# Patient Record
Sex: Male | Born: 1940
Health system: Southern US, Community
[De-identification: ages and names within clinical notes are randomized; demographics above are authoritative.]

## PROBLEM LIST (undated history)

## (undated) DIAGNOSIS — K5730 Diverticulosis of large intestine without perforation or abscess without bleeding: Secondary | ICD-10-CM

## (undated) DIAGNOSIS — I639 Cerebral infarction, unspecified: Secondary | ICD-10-CM

## (undated) DIAGNOSIS — F419 Anxiety disorder, unspecified: Secondary | ICD-10-CM

## (undated) DIAGNOSIS — I10 Essential (primary) hypertension: Secondary | ICD-10-CM

## (undated) DIAGNOSIS — R946 Abnormal results of thyroid function studies: Secondary | ICD-10-CM

## (undated) DIAGNOSIS — N400 Enlarged prostate without lower urinary tract symptoms: Secondary | ICD-10-CM

## (undated) DIAGNOSIS — G589 Mononeuropathy, unspecified: Secondary | ICD-10-CM

## (undated) DIAGNOSIS — K5150 Left sided colitis without complications: Secondary | ICD-10-CM

## (undated) DIAGNOSIS — I714 Abdominal aortic aneurysm, without rupture, unspecified: Secondary | ICD-10-CM

## (undated) DIAGNOSIS — K222 Esophageal obstruction: Secondary | ICD-10-CM

## (undated) DIAGNOSIS — T7840XA Allergy, unspecified, initial encounter: Secondary | ICD-10-CM

## (undated) DIAGNOSIS — G459 Transient cerebral ischemic attack, unspecified: Secondary | ICD-10-CM

## (undated) DIAGNOSIS — C61 Malignant neoplasm of prostate: Secondary | ICD-10-CM

## (undated) DIAGNOSIS — G629 Polyneuropathy, unspecified: Secondary | ICD-10-CM

## (undated) DIAGNOSIS — G4730 Sleep apnea, unspecified: Secondary | ICD-10-CM

## (undated) DIAGNOSIS — N183 Chronic kidney disease, stage 3 unspecified: Secondary | ICD-10-CM

## (undated) DIAGNOSIS — K2980 Duodenitis without bleeding: Secondary | ICD-10-CM

## (undated) DIAGNOSIS — K219 Gastro-esophageal reflux disease without esophagitis: Secondary | ICD-10-CM

## (undated) DIAGNOSIS — L570 Actinic keratosis: Secondary | ICD-10-CM

## (undated) HISTORY — DX: Anxiety disorder, unspecified: F41.9

## (undated) HISTORY — DX: Left sided colitis without complications: K51.50

## (undated) HISTORY — DX: Chronic kidney disease, stage 3 unspecified: N18.30

## (undated) HISTORY — DX: Duodenitis without bleeding: K29.80

## (undated) HISTORY — DX: Allergy, unspecified, initial encounter: T78.40XA

## (undated) HISTORY — PX: COLONOSCOPY: SHX174

## (undated) HISTORY — DX: Cerebral infarction, unspecified: I63.9

## (undated) HISTORY — DX: Abnormal results of thyroid function studies: R94.6

## (undated) HISTORY — PX: ABDOMINAL AORTIC ANEURYSM REPAIR: SHX42

## (undated) HISTORY — DX: Actinic keratosis: L57.0

## (undated) HISTORY — PX: UPPER GASTROINTESTINAL ENDOSCOPY: SHX188

## (undated) HISTORY — DX: Diverticulosis of large intestine without perforation or abscess without bleeding: K57.30

## (undated) HISTORY — DX: Malignant neoplasm of prostate: C61

## (undated) HISTORY — DX: Abdominal aortic aneurysm, without rupture: I71.4

## (undated) HISTORY — DX: Esophageal obstruction: K22.2

## (undated) HISTORY — DX: Essential (primary) hypertension: I10

## (undated) HISTORY — DX: Sleep apnea, unspecified: G47.30

## (undated) HISTORY — DX: Mononeuropathy, unspecified: G58.9

## (undated) HISTORY — DX: Abdominal aortic aneurysm, without rupture, unspecified: I71.40

## (undated) HISTORY — DX: Transient cerebral ischemic attack, unspecified: G45.9

## (undated) HISTORY — DX: Benign prostatic hyperplasia without lower urinary tract symptoms: N40.0

## (undated) HISTORY — PX: CHOLECYSTECTOMY: SHX55

## (undated) HISTORY — DX: Gastro-esophageal reflux disease without esophagitis: K21.9

## (undated) HISTORY — PX: PROSTATE SURGERY: SHX751

## (undated) HISTORY — DX: Chronic kidney disease, stage 3 (moderate): N18.3

---

## 2005-01-24 ENCOUNTER — Inpatient Hospital Stay (HOSPITAL_COMMUNITY): Admission: EM | Admit: 2005-01-24 | Discharge: 2005-01-26 | Payer: Self-pay | Admitting: Emergency Medicine

## 2005-01-24 ENCOUNTER — Ambulatory Visit: Payer: Self-pay | Admitting: Internal Medicine

## 2005-02-01 ENCOUNTER — Ambulatory Visit: Payer: Self-pay | Admitting: Internal Medicine

## 2005-02-03 ENCOUNTER — Ambulatory Visit: Payer: Self-pay | Admitting: Gastroenterology

## 2005-02-06 ENCOUNTER — Ambulatory Visit: Payer: Self-pay | Admitting: Internal Medicine

## 2005-03-09 ENCOUNTER — Encounter (INDEPENDENT_AMBULATORY_CARE_PROVIDER_SITE_OTHER): Payer: Self-pay | Admitting: *Deleted

## 2005-03-09 ENCOUNTER — Ambulatory Visit: Payer: Self-pay | Admitting: Gastroenterology

## 2005-04-13 ENCOUNTER — Ambulatory Visit: Payer: Self-pay | Admitting: Gastroenterology

## 2005-04-18 ENCOUNTER — Ambulatory Visit: Payer: Self-pay | Admitting: Internal Medicine

## 2005-06-06 ENCOUNTER — Ambulatory Visit: Payer: Self-pay | Admitting: Internal Medicine

## 2005-06-14 ENCOUNTER — Ambulatory Visit: Payer: Self-pay | Admitting: Internal Medicine

## 2005-06-20 ENCOUNTER — Ambulatory Visit: Payer: Self-pay | Admitting: Internal Medicine

## 2005-10-09 ENCOUNTER — Ambulatory Visit: Payer: Self-pay | Admitting: Internal Medicine

## 2006-01-29 ENCOUNTER — Ambulatory Visit: Payer: Self-pay | Admitting: Family Medicine

## 2006-02-01 ENCOUNTER — Emergency Department (HOSPITAL_COMMUNITY): Admission: EM | Admit: 2006-02-01 | Discharge: 2006-02-01 | Payer: Self-pay | Admitting: Emergency Medicine

## 2006-02-01 ENCOUNTER — Ambulatory Visit: Payer: Self-pay | Admitting: Family Medicine

## 2006-02-06 ENCOUNTER — Ambulatory Visit: Payer: Self-pay | Admitting: Family Medicine

## 2006-02-07 ENCOUNTER — Emergency Department (HOSPITAL_COMMUNITY): Admission: EM | Admit: 2006-02-07 | Discharge: 2006-02-07 | Payer: Self-pay | Admitting: Emergency Medicine

## 2006-02-09 ENCOUNTER — Ambulatory Visit (HOSPITAL_COMMUNITY): Admission: RE | Admit: 2006-02-09 | Discharge: 2006-02-09 | Payer: Self-pay | Admitting: Gastroenterology

## 2006-02-09 ENCOUNTER — Encounter (INDEPENDENT_AMBULATORY_CARE_PROVIDER_SITE_OTHER): Payer: Self-pay | Admitting: Specialist

## 2006-02-09 ENCOUNTER — Ambulatory Visit: Payer: Self-pay | Admitting: Gastroenterology

## 2006-02-15 ENCOUNTER — Ambulatory Visit: Payer: Self-pay | Admitting: Gastroenterology

## 2006-02-16 ENCOUNTER — Ambulatory Visit: Payer: Self-pay | Admitting: Gastroenterology

## 2006-03-13 ENCOUNTER — Ambulatory Visit: Payer: Self-pay | Admitting: Gastroenterology

## 2006-03-13 DIAGNOSIS — K2980 Duodenitis without bleeding: Secondary | ICD-10-CM | POA: Insufficient documentation

## 2006-04-25 ENCOUNTER — Ambulatory Visit: Payer: Self-pay | Admitting: Gastroenterology

## 2006-05-16 ENCOUNTER — Ambulatory Visit: Payer: Self-pay | Admitting: Gastroenterology

## 2006-08-07 ENCOUNTER — Ambulatory Visit: Payer: Self-pay | Admitting: Gastroenterology

## 2006-09-12 ENCOUNTER — Ambulatory Visit: Payer: Self-pay | Admitting: Gastroenterology

## 2006-09-12 LAB — CONVERTED CEMR LAB
ALT: 20 units/L (ref 0–53)
AST: 15 units/L (ref 0–37)
Albumin: 3.5 g/dL (ref 3.5–5.2)
Alkaline Phosphatase: 78 units/L (ref 39–117)
Basophils Absolute: 0.1 10*3/uL (ref 0.0–0.1)
Basophils Relative: 1.1 % — ABNORMAL HIGH (ref 0.0–1.0)
Bilirubin, Direct: 0.1 mg/dL (ref 0.0–0.3)
Eosinophils Absolute: 0 10*3/uL (ref 0.0–0.6)
Eosinophils Relative: 0.1 % (ref 0.0–5.0)
HCT: 44.4 % (ref 39.0–52.0)
Hemoglobin: 14.6 g/dL (ref 13.0–17.0)
Lymphocytes Relative: 13.9 % (ref 12.0–46.0)
MCHC: 33 g/dL (ref 30.0–36.0)
MCV: 93.9 fL (ref 78.0–100.0)
Monocytes Absolute: 0.2 10*3/uL (ref 0.2–0.7)
Monocytes Relative: 2.6 % — ABNORMAL LOW (ref 3.0–11.0)
Neutro Abs: 6.6 10*3/uL (ref 1.4–7.7)
Neutrophils Relative %: 82.3 % — ABNORMAL HIGH (ref 43.0–77.0)
Platelets: 323 10*3/uL (ref 150–400)
RBC: 4.73 M/uL (ref 4.22–5.81)
RDW: 14 % (ref 11.5–14.6)
Total Bilirubin: 0.6 mg/dL (ref 0.3–1.2)
Total Protein: 6.8 g/dL (ref 6.0–8.3)
WBC: 8 10*3/uL (ref 4.5–10.5)

## 2006-09-18 ENCOUNTER — Ambulatory Visit: Payer: Self-pay | Admitting: Internal Medicine

## 2006-09-18 DIAGNOSIS — K5150 Left sided colitis without complications: Secondary | ICD-10-CM | POA: Insufficient documentation

## 2006-10-08 ENCOUNTER — Ambulatory Visit: Payer: Self-pay | Admitting: Gastroenterology

## 2006-10-09 ENCOUNTER — Ambulatory Visit: Payer: Self-pay | Admitting: Gastroenterology

## 2006-10-09 ENCOUNTER — Encounter: Payer: Self-pay | Admitting: Gastroenterology

## 2006-10-12 ENCOUNTER — Ambulatory Visit: Payer: Self-pay | Admitting: Gastroenterology

## 2006-10-12 LAB — CONVERTED CEMR LAB
ALT: 28 units/L (ref 0–53)
AST: 17 units/L (ref 0–37)
Albumin: 3.1 g/dL — ABNORMAL LOW (ref 3.5–5.2)
Alkaline Phosphatase: 88 units/L (ref 39–117)
BUN: 22 mg/dL (ref 6–23)
CO2: 28 meq/L (ref 19–32)
Calcium: 8.8 mg/dL (ref 8.4–10.5)
Chloride: 110 meq/L (ref 96–112)
Creatinine, Ser: 1 mg/dL (ref 0.4–1.5)
GFR calc Af Amer: 96 mL/min
GFR calc non Af Amer: 80 mL/min
Glucose, Bld: 115 mg/dL — ABNORMAL HIGH (ref 70–99)
HCT: 42.1 % (ref 39.0–52.0)
Potassium: 4.3 meq/L (ref 3.5–5.1)
Sed Rate: 11 mm/hr (ref 0–20)
Sodium: 142 meq/L (ref 135–145)
Total Bilirubin: 0.7 mg/dL (ref 0.3–1.2)
Total Protein: 5.9 g/dL — ABNORMAL LOW (ref 6.0–8.3)

## 2006-10-15 ENCOUNTER — Ambulatory Visit: Payer: Self-pay | Admitting: Gastroenterology

## 2006-10-22 ENCOUNTER — Ambulatory Visit: Payer: Self-pay | Admitting: Gastroenterology

## 2006-11-05 ENCOUNTER — Ambulatory Visit: Payer: Self-pay | Admitting: Gastroenterology

## 2006-12-19 ENCOUNTER — Ambulatory Visit: Payer: Self-pay | Admitting: Gastroenterology

## 2007-01-08 ENCOUNTER — Ambulatory Visit: Payer: Self-pay | Admitting: Internal Medicine

## 2007-01-08 DIAGNOSIS — R21 Rash and other nonspecific skin eruption: Secondary | ICD-10-CM | POA: Insufficient documentation

## 2007-02-23 DIAGNOSIS — K222 Esophageal obstruction: Secondary | ICD-10-CM | POA: Insufficient documentation

## 2007-03-01 ENCOUNTER — Ambulatory Visit: Payer: Self-pay | Admitting: Internal Medicine

## 2007-03-01 DIAGNOSIS — I10 Essential (primary) hypertension: Secondary | ICD-10-CM | POA: Insufficient documentation

## 2007-03-01 DIAGNOSIS — N400 Enlarged prostate without lower urinary tract symptoms: Secondary | ICD-10-CM | POA: Insufficient documentation

## 2007-03-07 ENCOUNTER — Telehealth (INDEPENDENT_AMBULATORY_CARE_PROVIDER_SITE_OTHER): Payer: Self-pay | Admitting: *Deleted

## 2007-03-12 ENCOUNTER — Encounter: Payer: Self-pay | Admitting: Internal Medicine

## 2007-03-22 ENCOUNTER — Ambulatory Visit: Payer: Self-pay | Admitting: Internal Medicine

## 2007-03-22 DIAGNOSIS — R946 Abnormal results of thyroid function studies: Secondary | ICD-10-CM | POA: Insufficient documentation

## 2007-03-22 DIAGNOSIS — L570 Actinic keratosis: Secondary | ICD-10-CM | POA: Insufficient documentation

## 2007-03-22 LAB — CONVERTED CEMR LAB
Bilirubin Urine: NEGATIVE
Blood in Urine, dipstick: NEGATIVE
Free T4: 0.6 ng/dL (ref 0.6–1.6)
Glucose, Urine, Semiquant: NEGATIVE
Ketones, urine, test strip: NEGATIVE
Nitrite: NEGATIVE
Specific Gravity, Urine: 1.025
TSH: 5.24 microintl units/mL (ref 0.35–5.50)
Urobilinogen, UA: 0.2
WBC Urine, dipstick: NEGATIVE
pH: 5

## 2007-05-02 ENCOUNTER — Ambulatory Visit: Payer: Self-pay | Admitting: Internal Medicine

## 2007-05-02 DIAGNOSIS — G589 Mononeuropathy, unspecified: Secondary | ICD-10-CM | POA: Insufficient documentation

## 2007-05-03 LAB — CONVERTED CEMR LAB: Vitamin B-12: 389 pg/mL (ref 211–911)

## 2007-07-06 ENCOUNTER — Encounter: Payer: Self-pay | Admitting: Gastroenterology

## 2007-07-08 ENCOUNTER — Telehealth: Payer: Self-pay | Admitting: Gastroenterology

## 2007-07-09 ENCOUNTER — Telehealth: Payer: Self-pay | Admitting: Gastroenterology

## 2007-07-10 ENCOUNTER — Telehealth: Payer: Self-pay | Admitting: Gastroenterology

## 2007-07-10 ENCOUNTER — Encounter: Payer: Self-pay | Admitting: Gastroenterology

## 2007-07-10 ENCOUNTER — Ambulatory Visit (HOSPITAL_COMMUNITY): Admission: RE | Admit: 2007-07-10 | Discharge: 2007-07-10 | Payer: Self-pay | Admitting: Gastroenterology

## 2007-07-17 ENCOUNTER — Ambulatory Visit: Payer: Self-pay | Admitting: Gastroenterology

## 2007-08-01 ENCOUNTER — Telehealth: Payer: Self-pay | Admitting: Gastroenterology

## 2007-08-05 ENCOUNTER — Encounter: Payer: Self-pay | Admitting: Internal Medicine

## 2007-08-05 ENCOUNTER — Ambulatory Visit (HOSPITAL_COMMUNITY): Admission: RE | Admit: 2007-08-05 | Discharge: 2007-08-05 | Payer: Self-pay | Admitting: Gastroenterology

## 2007-08-06 ENCOUNTER — Encounter: Payer: Self-pay | Admitting: Gastroenterology

## 2007-09-03 ENCOUNTER — Ambulatory Visit: Payer: Self-pay | Admitting: Gastroenterology

## 2007-12-23 ENCOUNTER — Ambulatory Visit (HOSPITAL_COMMUNITY): Admission: RE | Admit: 2007-12-23 | Discharge: 2007-12-23 | Payer: Self-pay | Admitting: Family Medicine

## 2009-01-18 ENCOUNTER — Telehealth: Payer: Self-pay | Admitting: Gastroenterology

## 2009-01-18 ENCOUNTER — Encounter: Payer: Self-pay | Admitting: Gastroenterology

## 2009-01-27 ENCOUNTER — Ambulatory Visit (HOSPITAL_COMMUNITY): Admission: RE | Admit: 2009-01-27 | Discharge: 2009-01-27 | Payer: Self-pay | Admitting: Gastroenterology

## 2009-01-27 ENCOUNTER — Ambulatory Visit: Payer: Self-pay | Admitting: Gastroenterology

## 2009-03-16 ENCOUNTER — Ambulatory Visit: Payer: Self-pay | Admitting: Urology

## 2009-05-11 ENCOUNTER — Ambulatory Visit: Payer: Self-pay | Admitting: Urology

## 2009-05-12 ENCOUNTER — Ambulatory Visit: Payer: Self-pay | Admitting: Internal Medicine

## 2009-05-25 ENCOUNTER — Ambulatory Visit: Payer: Self-pay | Admitting: Urology

## 2009-07-27 ENCOUNTER — Emergency Department: Payer: Self-pay | Admitting: Emergency Medicine

## 2009-07-27 ENCOUNTER — Telehealth: Payer: Self-pay | Admitting: Gastroenterology

## 2009-07-27 ENCOUNTER — Encounter: Payer: Self-pay | Admitting: Gastroenterology

## 2009-07-28 ENCOUNTER — Ambulatory Visit: Payer: Self-pay | Admitting: Gastroenterology

## 2009-08-10 ENCOUNTER — Telehealth: Payer: Self-pay | Admitting: Gastroenterology

## 2009-08-11 ENCOUNTER — Telehealth: Payer: Self-pay | Admitting: Gastroenterology

## 2009-08-13 ENCOUNTER — Ambulatory Visit (HOSPITAL_COMMUNITY): Admission: RE | Admit: 2009-08-13 | Discharge: 2009-08-13 | Payer: Self-pay | Admitting: Gastroenterology

## 2009-08-13 ENCOUNTER — Encounter (INDEPENDENT_AMBULATORY_CARE_PROVIDER_SITE_OTHER): Payer: Self-pay | Admitting: *Deleted

## 2009-08-13 ENCOUNTER — Ambulatory Visit: Payer: Self-pay | Admitting: Gastroenterology

## 2009-08-19 ENCOUNTER — Telehealth: Payer: Self-pay | Admitting: Gastroenterology

## 2009-08-25 ENCOUNTER — Ambulatory Visit: Payer: Self-pay | Admitting: Gastroenterology

## 2009-08-25 DIAGNOSIS — R1013 Epigastric pain: Secondary | ICD-10-CM | POA: Insufficient documentation

## 2009-08-27 ENCOUNTER — Encounter: Payer: Self-pay | Admitting: Gastroenterology

## 2009-11-09 ENCOUNTER — Telehealth: Payer: Self-pay | Admitting: Gastroenterology

## 2009-11-10 ENCOUNTER — Ambulatory Visit: Payer: Self-pay | Admitting: Gastroenterology

## 2010-01-13 ENCOUNTER — Ambulatory Visit: Payer: Self-pay | Admitting: Urology

## 2010-02-20 ENCOUNTER — Encounter: Payer: Self-pay | Admitting: Family Medicine

## 2010-03-02 NOTE — Assessment & Plan Note (Signed)
Summary: CPX/CDL   Vital Signs:  Patient Profile:   70 Years Old Male Weight:      222.50 pounds Temp:     97.1 degrees F oral Pulse rate:   84 / minute BP sitting:   134 / 86  (left arm) Cuff size:   large  Vitals Entered By: Wandra Mannan (March 22, 2007 8:25 AM)              Vision Screening: Left eye w/o correction: 20 / 25 Right Eye w/o correction: 20 / 25 Both eyes w/o correction:  20/ 20        Vision Entered By: Wandra Mannan (March 22, 2007 8:26 AM) Audiometry Screening        Left  500 hz: 25db 1000 hz: 25db 2000 hz: 25db 4000 hz: 25db Right  500 hz: 25db 1000 hz: 25db 2000 hz: 25db 4000 hz: 25db    Chief Complaint:  cpx DOT.  History of Present Illness: colitis seems better since on probiotics  Couldn't tolerate HCTZ but BP seems fine  Had blood work from work WBC  ~12K TSH slightly elevated    Current Allergies (reviewed today): ! FLAGYL (METRONIDAZOLE)  Past Medical History:    Reviewed history from 03/01/2007 and no changes required:       Colitis--left sided----------------------------Dr Arlyce Dice       Hypertension       Benign prostatic hypertrophy  Past Surgical History:    Reviewed history from 03/01/2007 and no changes required:       Cholecystectomy 2002       Colonscopy 2003       hernia repair 1982       EGD duodenitis and stricture 03/13/2006       Prostate procedure 1990's--Stoioff   Family History:    Dad died of suicide    Mom died of cancer @82     1 sister    No CAD, DM    ??HTN in family    No prostate or colon cancer  Social History:    Reviewed history from 01/08/2007 and no changes required:       Married--1 son       Never Smoked       Alcohol use-no       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  The patient denies blurring, diplopia, vision loss, tinnitus, decreased hearing, chest pain,  palpitations, syncope, dyspnea on exertion, peripheral edema, nausea, vomiting, melena, hematochezia, erectile dysfunction, joint swelling, anxiety, and enlarged lymph nodes.         Notes medial left calf pain when he puts his foot down. No claudication Sleeps poorly often--no real change Freq nocturia but not that much trouble during day Teeth okay--overdue for dentist Slight cough from lisinopril Nausea has resolved Mild neck and hand pain scaly areas on left temple Bruised easy since being on prednisone for colitis--mostly on left hand Gets episodic depression--lasts only briefly   Physical Exam  General:     alert and normal appearance.   Eyes:     pupils equal, pupils round, pupils reactive to light, and no optic disk abnormalities.   Ears:     R ear normal and L ear normal.   Mouth:     no lesions.   Neck:     supple, no masses, no thyromegaly, no carotid bruits, and no cervical lymphadenopathy.  Lungs:     normal respiratory effort and normal breath sounds.   Heart:     normal rate, regular rhythm, no murmur, and no gallop.   Abdomen:     soft, non-tender, no masses, no hepatomegaly, and no splenomegaly.   Rectal:     no hemorrhoids and no masses.   tight sphincter--linited internal Msk:     no joint tenderness and no joint swelling.   Pulses:     faint in left foot 1+ in rgiht Extremities:     no edema or varicosities Neurologic:     strength normal in all extremities.   Skin:     2 actinics on left forehead 3 early ones left preauricular area Axillary Nodes:     No palpable lymphadenopathy Psych:     normally interactive, good eye contact, not anxious appearing, and not depressed appearing.      Impression & Recommendations:  Problem # 1:  PREVENTIVE HEALTH CARE (ICD-V70.0) Assessment: Comment Only has had PSA and colon discussed fitness Orders: UA Dipstick w/o Micro (manual) (04540)   Problem # 2:  HYPERTENSION (ICD-401.9) Assessment:  Unchanged good control  The following medications were removed from the medication list:    Lisinopril-hydrochlorothiazide 10-12.5 Mg Tabs (Lisinopril-hydrochlorothiazide) .Marland Kitchen... 1 daily  His updated medication list for this problem includes:    Lisinopril 10 Mg Tabs (Lisinopril) .Marland Kitchen... 1 daily    Terazosin Hcl 5 Mg Caps (Terazosin hcl) .Marland Kitchen... 1 daily at bedtime   Problem # 3:  BENIGN PROSTATIC HYPERTROPHY (ICD-600.00) Assessment: Unchanged nocturia is troubling will try terazosin  Problem # 4:  THYROID FUNCTION TEST, ABNORMAL (ICD-794.5) Assessment: Comment Only will recheck Orders: Venipuncture (98119) TLB-TSH (Thyroid Stimulating Hormone) (84443-TSH) TLB-T4 (Thyrox), Free (14782-NF6O)   Problem # 5:  ACTINIC KERATOSIS (ICD-702.0) Assessment: New liquid nitrogen 30 seconds x 2 to more advanced areas 20 seconds x 2 to others Orders: Cryotherapy/Destruction benign or premalignant lesion (1st lesion)  (17000) Cryotherapy/Destruction benign or premalignant lesion (2nd-14th lesions) (17003)   Complete Medication List: 1)  Lisinopril 10 Mg Tabs (Lisinopril) .Marland Kitchen.. 1 daily 2)  Terazosin Hcl 5 Mg Caps (Terazosin hcl) .Marland Kitchen.. 1 daily at bedtime  Other Orders: Pneumococcal Vaccine Ped < 39yrs (13086) Admin 1st Vaccine (57846)   Patient Instructions: 1)  Please schedule a follow-up appointment in 6 months.    Prescriptions: TERAZOSIN HCL 5 MG  CAPS (TERAZOSIN HCL) 1 daily at bedtime  #30 x 12   Entered and Authorized by:   Cindee Salt MD   Signed by:   Cindee Salt MD on 03/22/2007   Method used:   Electronically sent to ...       [facility]*       [address]       [place], [place]  [address]       Ph: [phone] or [phone]       Fax: [phone]   RxID:   [id number]  ] Current Allergies (reviewed today): ! FLAGYL (METRONIDAZOLE) Current Medications (including changes made in today's visit):  LISINOPRIL 10 MG  TABS  (LISINOPRIL) 1 daily TERAZOSIN HCL 5 MG  CAPS (TERAZOSIN HCL) 1 daily at bedtime   Laboratory Results   Urine Tests  Date/Time Recieved: 03/22/2007  Routine Urinalysis   Color: yellow Appearance: Clear Glucose: negative   (Normal Range: Negative) Bilirubin: negative   (Normal Range: Negative) Ketone: negative   (Normal Range: Negative) Spec. Gravity: 1.025   (Normal  Range: 1.003-1.035) Blood: negative   (Normal Range: Negative) pH: 5.0   (Normal Range: 5.0-8.0) Protein: trace   (Normal Range: Negative) Urobilinogen: 0.2   (Normal Range: 0-1) Nitrite: negative   (Normal Range: Negative) Leukocyte Esterace: negative   (Normal Range: Negative)        Pneumococcal Vaccine # 1    Vaccine Type: Prevnar    Site: right deltoid    Mfr: Merck    Dose: 0.25 ml    Route: IM    Given by: Wandra Mannan    Exp. Date: 06/26/2008    Lot #: [id number]    VIS given: 10/29/00 version given March 22, 2007.  Pneumovax Vaccine    Vaccine Type: Pneumovax

## 2010-03-02 NOTE — Procedures (Signed)
Summary: Flexible Sigmoidoscopy  Patient: Matthew Luna Note: All result statuses are Final unless otherwise noted.  Tests: (1) Flexible Sigmoidoscopy (FLX)  FLX Flexible Sigmoidoscopy                             DONE     [facility]     [address] [place], [place]  [address]           FLEXIBLE SIGMOIDOSCOPY PROCEDURE REPORT           PATIENT:  [name], [name]  MR#:  [id number]     BIRTHDATE:  31-Jul-1940, 68 yrs. old  GENDER:  male           ENDOSCOPIST:  Barbette Hair. Arlyce Dice, MD     Referred by:           PROCEDURE DATE:  08/13/2009     PROCEDURE:  Flexible Sigmoidoscopy with biopsy     ASA CLASS:  Class II     INDICATIONS:  diarrhea Recent antibiotic Rx with cipro; took vanco     for 9 days; now with recurrent diarrhea           MEDICATIONS:   Fentanyl 75 mcg, Versed 6 mg IV           DESCRIPTION OF PROCEDURE:   After the risks benefits and     alternatives of the procedure were thoroughly explained, informed     consent was obtained.  Digital rectal exam was performed and     revealed no abnormalities.   The  endoscope was introduced through     the anus and advanced to the splenic flexure, without limitations.     The quality of the prep was .  The instrument was then slowly     withdrawn as the mucosa was fully examined.     <<PROCEDUREIMAGES>>           Colitis was found. Diffuse, mild erythema with questionable minute     pseudomembranes. Changes more marked in distal 25cm of colon with     areas of submucosal hemorrhage as well. Bxs taken (see image2,     image3, image6, and image7).  Small amount of mucus.  Moderate     diverticulosis was found in the sigmoid colon (see image6).     Retroflexed views in the rectum revealed not performed.    The scope     was then withdrawn from the patient and the procedure terminated.           COMPLICATIONS:  None           ENDOSCOPIC IMPRESSION:     1) Probable recurrent pseudomembranous  colitis.  Suspect PMC  in     view of extent of colitis to more proximal colon.  Pt has h/o left     sided colitis only.   More marked changes in left colon are     consistent with findings.     2) Moderate diverticulosis in the sigmoid colon     RECOMMENDATIONS:Resume vanco for 1 month tapering course     Florastor qd     Stool for C dificile     toxin           REPEAT EXAM:  No           ______________________________     Barbette Hair. Arlyce Dice, MD  CC:  Lynnea Ferrier, MD           n.     Rosalie DoctorBarbette Hair. Zymire Turnbo at 08/13/2009 09:12 AM           Gillermo Murdoch, [id number]  Note: An exclamation mark (!) indicates a result that was not dispersed into the flowsheet. Document Creation Date: 08/13/2009 9:13 AM _______________________________________________________________________  (1) Order result status: Final Collection or observation date-time: 08/13/2009 09:01 Requested date-time:  Receipt date-time:  Reported date-time:  Referring Physician:   Ordering Physician: Melvia Heaps [phone]) Specimen Source:  Source: Launa Grill Order Number: [id number] Lab site:

## 2010-03-02 NOTE — Procedures (Signed)
Summary: Gastroenterology Flex  Gastroenterology Flex   Imported By: Christie Nottingham 02/28/2007 11:50:54  _____________________________________________________________________  External Attachment:    Type:   Image     Comment:   External Document  Appended Document: Gastroenterology Flex SP-Surgical Pathology - STATUS: Final  .          By: Morrie Sheldon,       Perform Date: 9 Sep08 00:01  Ordered By: Dennard Nip,         Ordered Date: 10Sep08 09:17  Facility: [facility]                               Department: CPATH  Service Report Text  [facility]   [address] [address]   [place], [place] [address]   Telephone [phone] or [phone] Fax [phone]    REPORT OF SURGICAL PATHOLOGY    Case #: [id number]   Patient Name: Matthew Luna, Matthew Luna.   Office Chart Number: [id number]    MRN: [id number]   Pathologist: Beulah Gandy. Luisa Hart, MD   DOB/Age [birth year] (Age: 70) Gender: M   Date Taken: 10/09/2006   Date Received: 10/09/2006    FINAL DIAGNOSIS    ***MICROSCOPIC EXAMINATION AND DIAGNOSIS***    RECTAL BIOPSY: CHRONIC MINIMALLY ACTIVE INFLAMMATION. SEE   COMMENT.    COMMENT   The biopsy shows increased chronic inflammation in the lamina   propria associated with a rare focus of active neutrophilic   cryptitis with erosion of the surface. There is also decrease in   the number of crypts and the rare residual crypt shows distortion   and slight nuclear stratification. The findings are consistent   with chronic mildly active colitis and although this biopsy   fragment is small, findings are consistent with inflammatory   bowel disease. The mildly atypical changes within the few   residual crypts include nuclear stratification and a rare mitotic   figure. Although the biopsy is small and there are only a few   residual crypts present, the findings suggest reactive and   inflammatory atypia rather than dysplastic related atypia.   (JDP:gt,  10/11/06)    gdt   Date Reported: 10/11/2006 Beulah Gandy. Luisa Hart, MD   *** Electronically Signed Out By JDP ***    Clinical information   Colitis (jes)    specimen(s) obtained   Rectum, biopsy    Gross Description   Received in formalin is a tan, soft tissue fragment that is   submitted in toto. Size: 0.2 cm One block (ML:jes,10/10/06)    jes/   Additional Information  HL7 RESULT STATUS : F  External IF Update Timestamp : 2006-10-09:22:25:00.000000

## 2010-03-02 NOTE — Progress Notes (Signed)
Summary: Triage--Dysphagia  Phone Note Call from Patient Call back at [phone]   Caller: Patient Reason for Call: Talk to Nurse Summary of Call: Pt is having trouble swallowing. Initial call taken by: Karna Christmas,  January 18, 2009 3:33 PM  Follow-up for Phone Call        Worsening dysphagia for 1 month. Doesn't take a PPI. Thinks he has Prevacid at home.  1) Prevacid two times a day for 7 days, then once daily 2) Soft,bland diet. Be very careful with meats,rice and breads. 3) Endo/Balloon Dil. at [facility] on 01-27-09 at 7:45am 4) If symptoms become worse call back immediately or go to ER.  Follow-up by: Laureen Ochs LPN,  January 18, 2009 4:07 PM

## 2010-03-02 NOTE — Progress Notes (Signed)
Summary: Condition Update  Phone Note Outgoing Call Call back at Home Phone [phone]   Call placed by: Laureen Ochs LPN,  August 19, 2009 8:12 AM Call placed to: Patient Summary of Call: Follow-up from Flex. biopsy on 08-13-09.  Pt. states he is improving, slowly, but he is improving. He wants to complete the bottle of Vanco. he is taking. He will callback on Monday with an update. He will call sooner as needed. Initial call taken by: Laureen Ochs LPN,  August 19, 2009 8:16 AM  Follow-up for Phone Call        Pt. calling to state the diarrhea is still present, he doesn't feel it is getting any better. Per Flex path. report on 08-13-09, pt. will stop Vanco. and begin Prednisone 40mg  daily. He will keep appt. w/Dr.Jacqlyn Marolf on 08-25-09, callback sooner as needed. Follow-up by: Laureen Ochs LPN,  August 20, 2009 11:24 AM    New/Updated Medications: PREDNISONE 10 MG  TABS (PREDNISONE) Take 4 tabs (40mg ) once daily.    (You will taper off as directed by Dr.Nile Prisk at your appt. 08-25-09) Prescriptions: PREDNISONE 10 MG  TABS (PREDNISONE) Take 4 tabs (40mg ) once daily.    (You will taper off as directed by Dr.Arietta Eisenstein at your appt. 08-25-09)  #100 x 0   Entered by:   Laureen Ochs LPN   Authorized by:   Louis Meckel MD   Signed by:   Laureen Ochs LPN on 09/81/1914   Method used:   Electronically to        [id number] (retail)       [address]       [place], [place]  [address]       Ph: [phone] or [phone]       Fax: [phone]   RxID:   [id number]

## 2010-03-02 NOTE — Op Note (Signed)
Summary: Esoph foreign body/[facility]  Esoph foreign body/[facility]   Imported By: Lester [place] 07/10/2007 10:44:37  _____________________________________________________________________  External Attachment:    Type:   Image     Comment:   External Document

## 2010-03-02 NOTE — Assessment & Plan Note (Signed)
Summary: POST FLEX. FOLLOW-UP               [name]   History of Present Illness Visit Type: Follow-up Visit Primary GI MD: Melvia Heaps MD [facility] Primary Provider: Chaney Malling, MD  Requesting Provider: na Chief Complaint: follow-up Flex Sig. History of Present Illness:   Matthew Luna has returned for followup of his diarrhea. Flexiblel sigmoidoscopy demonstrated diffuse colitis with more marked changes in the distal left colon.  Biopsies showed a chronic active colitis though no pseudomembranes.  On 40 mg of prednisone his diarrhea has significantly improved although it remains.  In addition, he complains of postprandial upper abdominal fullness.  He denies dysphagia.  He has a history of esophageal stricture.   GI Review of Systems    Reports abdominal pain and  chest pain.     Location of  Abdominal pain: epigastric area.    Denies acid reflux, belching, bloating, dysphagia with liquids, dysphagia with solids, heartburn, loss of appetite, nausea, vomiting, vomiting blood, weight loss, and  weight gain.      Reports diarrhea.     Denies anal fissure, black tarry stools, change in bowel habit, constipation, diverticulosis, fecal incontinence, heme positive stool, hemorrhoids, irritable bowel syndrome, jaundice, light color stool, liver problems, rectal bleeding, and  rectal pain.    Current Medications (verified): 1)  Celexa 10 Mg Tabs (Citalopram Hydrobromide) .... One Tablet By Mouth Once Daily 2)  Prednisone 10 Mg  Tabs (Prednisone) .... Take 4 Tabs (40mg ) Once Daily.    (You Will Taper Off As Directed By Dr.Rod Majerus At Your Appt. 08-25-09)  Allergies (verified): 1)  ! Flagyl (Metronidazole) 2)  ! * Terazosin 3)  ! * Lialda 4)  ! Augmentin (Amoxicillin-Pot Clavulanate)  Past History:  Past Medical History: Reviewed history from 03/22/2007 and no changes required. Colitis--left sided----------------------------Dr Arlyce Dice Hypertension Benign prostatic hypertrophy  Past  Surgical History: Reviewed history from 03/01/2007 and no changes required. Cholecystectomy 2002 Colonscopy 2003 hernia repair 1982 EGD duodenitis and stricture 03/13/2006 Prostate procedure 1990's--Stoioff  Family History: Reviewed history from 03/22/2007 and no changes required. Dad died of suicide Mom died of cancer @82  1 sister No CAD, DM ??HTN in family No prostate or colon cancer  Social History: Reviewed history from 01/08/2007 and no changes required. 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  The patient denies allergy/sinus, anemia, anxiety-new, arthritis/joint pain, back pain, blood in urine, breast changes/lumps, change in vision, confusion, coughing up blood, depression-new, fainting, fatigue, fever, headaches-new, hearing problems, heart murmur, heart rhythm changes, itching, muscle pains/cramps, night sweats, nosebleeds, shortness of breath, skin rash, sleeping problems, sore throat, swelling of feet/legs, swollen lymph glands, thirst - excessive, urination - excessive, urination changes/pain, urine leakage, vision changes, and voice change.    Vital Signs:  Patient profile:   70 year old male Height:      75 inches Weight:      228 pounds BMI:     28.60 Pulse rate:   68 / minute Pulse rhythm:   regular BP sitting:   148 / 94  (left arm)  Vitals Entered By: Milford Cage NCMA (August 25, 2009 3:42 PM)   Impression & Recommendations:  Problem # 1:  COLITIS, LEFT-SIDED ULCERATIVE (ICD-556.5) Despite the negative biopsies for pseudomembranes I have some suspicion that he could have recurrent pseudomembranous colitis in addition to his idiopathic  colitis.  Recommendations #1 decrease prednisone to 30 mg daily and then to 20 mg in 5-7 days #2 stool for C. difficile toxin Orders: T-Culture, C-Diff Toxin A/B ([id number])  Problem # 2:  ABDOMINAL PAIN,  EPIGASTRIC (ICD-789.06) This problem is improving and is probably due to nonulcer dyspepsia  Recommendations #1 empiric therapy with AcipHex 20 mg daily  Problem # 3:  ESOPHAGEAL STRICTURE (ICD-530.3) Assessment: Comment Only  Patient Instructions: 1)  Copy sent to : Donita Brooks II, MD  2)  You will go to the basement today for labs 3)  We are giving you Aciphex samples today 4)  Reduce Prednisone to three times a day for 1 week 5)  then reduce to two times a day for 1 week 6)  You will need to return in 4 weeks for a follow up appointment 7)  The medication list was reviewed and reconciled.  All changed / newly prescribed medications were explained.  A complete medication list was provided to the patient / caregiver. Prescriptions: ACIPHEX 20 MG TBEC (RABEPRAZOLE SODIUM) take one tab daily  #30 x 1   Entered and Authorized by:   Louis Meckel MD   Signed by:   Louis Meckel MD on 08/25/2009   Method used:   [facility] to        [id number]       [address]       [place], [place]  [address]       Ph: [phone] or [phone]       Fax: [phone]   RxID:   [id number]

## 2010-03-02 NOTE — Assessment & Plan Note (Signed)
Summary: Elevated BP   Vital Signs:  Patient Profile:   70 Years Old Male Weight:      228.25 pounds Temp:     97 degrees F oral Pulse rate:   80 / minute BP sitting:   144 / 98  (left arm) Cuff size:   large  Vitals Entered By: Wandra Mannan (March 01, 2007 10:06 AM)                 Chief Complaint:  elevated bp.  History of Present Illness: BP was found to be elevated about 2 weeks ago Went for CDL exam--BP  ~161/09 Started on lisinopril 5mg  daily Diastolic noted down in 80's then But then up again so increased to 10mg  daily Has tried to cut down salt    Current Allergies (reviewed today): ! 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         Occ dull headache--fairly rare though Occ mild dizziness--very sligh Notes electricity feeling from inside of left ankle to mid calf Colitis is better on probiotic Still with freq nocturia   Serial Vital Signs/Assessments:  Time      Position  BP       Pulse  Resp  Temp     By           R Arm     146/100                        Cindee Salt MD   Physical Exam  General:     alert and normal appearance.   Neck:     supple, no masses, no thyromegaly, no carotid bruits, and no cervical lymphadenopathy.   Lungs:   normal respiratory effort and normal breath sounds.   Heart:     normal rate, regular rhythm, no murmur, and no gallop.   Extremities:     no edema    Impression & Recommendations:  Problem # 1:  HYPERTENSION (ICD-401.9) Assessment: Deteriorated will add HCTZ Recheck about 3 weeks  His updated medication list for this problem includes:    Lisinopril-hydrochlorothiazide 10-12.5 Mg Tabs (Lisinopril-hydrochlorothiazide) .Marland Kitchen... 1 daily   Complete Medication List: 1)  Protonix 40 Mg Tbec (Pantoprazole sodium) .... Take 1 tablet by mouth once a day 2)  Lisinopril-hydrochlorothiazide 10-12.5 Mg Tabs (Lisinopril-hydrochlorothiazide) .Marland Kitchen.. 1 daily   Patient Instructions: 1)  Please schedule a follow-up appointment in 3 weeks. 2)  Stop the plain lisinopril and start the lisinopril with HCTZ    Prescriptions:  LISINOPRIL-HYDROCHLOROTHIAZIDE 10-12.5 MG  TABS (LISINOPRIL-HYDROCHLOROTHIAZIDE) 1 daily  #30 x 12   Entered and Authorized by:   Cindee Salt MD   Signed by:   Cindee Salt MD on 03/01/2007   Method used:   Electronically sent to ...       [facility]*       [address]       [place], [place]  [address]       Ph: [phone] or [phone]       Fax: [phone]   RxID:   [id number]  ] Current Allergies (reviewed today): ! FLAGYL (METRONIDAZOLE) Current Medications (including changes made in today's visit):  PROTONIX 40 MG  TBEC (PANTOPRAZOLE SODIUM) Take 1 tablet by mouth once a day LISINOPRIL-HYDROCHLOROTHIAZIDE 10-12.5 MG  TABS (LISINOPRIL-HYDROCHLOROTHIAZIDE) 1 daily

## 2010-03-02 NOTE — Procedures (Signed)
Summary: Gastroenterology EGD  Gastroenterology EGD   Imported By: Christie Nottingham 02/28/2007 11:50:22  _____________________________________________________________________  External Attachment:    Type:   Image     Comment:   External Document

## 2010-03-02 NOTE — Assessment & Plan Note (Signed)
Summary: ER LAST NIGHT/COLITIS FLARE AND ? C-DIFF.          [name]   History of Present Illness Visit Type: Follow-up Visit Primary GI MD: Melvia Heaps MD [facility] Primary Provider: Chaney Malling, MD  Requesting Provider: na Chief Complaint: Colitis flare, diarrhea and fatigue  History of Present Illness:   Matthew Luna has returned for evaluation of diarrhea.  Over the past week she has had increasingly severe diarrhea.  Yesterday he was seen at [facility] ER for this problem.  CT Scan did not there is any abnormalities but his white count was.  He had taken a one-week course of Cipro 3  weeks before.  Since starting vancomycin 12 hours ago he feels improved.  He has had no bleeding.  He has a history of left-sided colitis.   GI Review of Systems      Denies abdominal pain, acid reflux, belching, bloating, chest pain, dysphagia with liquids, dysphagia with solids, heartburn, loss of appetite, nausea, vomiting, vomiting blood, weight loss, and  weight gain.      Reports diarrhea.     Denies anal fissure, black tarry stools, change in bowel habit, constipation, diverticulosis, fecal incontinence, heme positive stool, hemorrhoids, irritable bowel syndrome, jaundice, light color stool, liver problems, rectal bleeding, and  rectal pain.    Current Medications (verified): 1)  Zocor 40 Mg Tabs (Simvastatin) .... 1/2 Tablet By Mouth Once Daily 2)  Celexa 10 Mg Tabs (Citalopram Hydrobromide) .... One Tablet By Mouth Once Daily 3)  Vancocin Hcl 125 Mg Caps (Vancomycin Hcl) .... One Capsule By Mouth Four Times A Day For Ten Days  Allergies (verified): 1)  ! Flagyl (Metronidazole) 2)  ! * Terazosin 3)  ! * Lialda 4)  ! Augmentin (Amoxicillin-Pot Clavulanate)  Past History:  Past Medical History: Reviewed history from 03/22/2007 and no changes required. Colitis--left sided----------------------------Dr Arlyce Dice Hypertension Benign prostatic hypertrophy  Past Surgical  History: Reviewed history from 03/01/2007 and no changes required. Cholecystectomy 2002 Colonscopy 2003 hernia repair 1982 EGD duodenitis and stricture 03/13/2006 Prostate procedure 1990's--Stoioff  Family History: Reviewed history from 03/22/2007 and no changes required. Dad died of suicide Mom died of cancer @82  1 sister No CAD, DM ??HTN in family No prostate or colon cancer  Social History: Reviewed history from 01/08/2007 and no changes required. 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  The patient denies allergy/sinus, anemia, anxiety-new, arthritis/joint pain, back pain, blood in urine, breast changes/lumps, change in vision, confusion, cough, coughing up blood, depression-new, fainting, headaches-new, hearing problems, heart murmur, heart rhythm changes, itching, muscle pains/cramps, night sweats, nosebleeds, shortness of breath, skin rash, sleeping problems, sore throat, swelling of feet/legs, swollen lymph glands, thirst - excessive, urination - excessive, urination changes/pain, urine leakage, vision changes, and voice change.    Vital Signs:  Patient profile:   70 year old male Height:      75 inches Weight:      225 pounds BMI:     28.22 BSA:     2.31 Temp:     97.7 degrees F oral Pulse rate:   76 / minute Pulse rhythm:   regular BP sitting:   146 / 88  (left arm) Cuff size:   regular  Vitals Entered By: Ok Anis CMA (July 28, 2009 2:44 PM)  Physical Exam  Additional Exam:  On physical exam  he is a slightly ill-appearing male  skin: anicteric HEENT: normocephalic; PEERLA; no nasal or pharyngeal abnormalities neck: supple nodes: no cervical lymphadenopathy chest: clear to ausculatation and percussion heart: no murmurs, gallops, or rubs abd: soft, nontender; BS normoactive; no abdominal masses, tenderness, organomegaly rectal:  deferred ext: no cynanosis, clubbing, edema skeletal: no deformities neuro: oriented x 3; no focal abnormalities    Impression & Recommendations:  Problem # 1:  COLITIS, LEFT-SIDED ULCERATIVE (ICD-556.5) His current diarrhea could very well be due to pseudomembranous colitis.  Recommendations #1 continue vancomycin add a probiotic.  I carefully instructed him to call if he does not continue to improve over the next 3-4 days at which point I would do a sigmoidoscopy. It is noteworthy that he is intolerant to Lialda  Problem # 2:  ESOPHAGEAL STRICTURE (ICD-530.3) Plan repeat dilatation p.r.n.  Patient Instructions: 1)  Copy sent to : Donita Brooks II, MD  2)  You can pick up your Vancocin take one tablet by mouth four times a day for ten days.  3)  Please continue current medications.  4)  The medication list was reviewed and reconciled.  All changed / newly prescribed medications were explained.  A complete medication list was provided to the patient / caregiver. Prescriptions: VANCOCIN HCL 125 MG CAPS (VANCOMYCIN HCL) one capsule by mouth four times a day for ten days  #40 x 0   Entered by:   Harlow Mares CMA (AAMA)   Authorized by:   Louis Meckel MD   Signed by:   Harlow Mares CMA (AAMA) on 07/28/2009   Method used:   Electronically to        [facility] [address] (retail)       [address]       [place], [place]  [address]       Ph: [phone]       Fax: [phone]   RxID:   [id number]

## 2010-03-02 NOTE — Progress Notes (Signed)
Summary: proc on mon  Phone Note Call from Patient Call back at [phone]   Caller: Patient Call For: Taegan Haider Reason for Call: Talk to Nurse Details for Reason: proc on mon Summary of Call: pt has ZED sch on Mon 7-6. Would like to know why it cannot be sch @ [facility] Initial call taken by: Guadlupe Spanish [facility],  August 01, 2007 8:12 AM  Follow-up for Phone Call        Left message on patients machine to call back.  Harlow Mares CMA  August 01, 2007 8:18 AM   advised pt that he is having the procedure at the hospital bc of the type of dilation. Follow-up by: Harlow Mares CMA,  August 01, 2007 9:30 AM

## 2010-03-02 NOTE — Procedures (Signed)
Summary: Endoscopy   EGD  Procedure date:  08/05/2007  Findings:      Location: [facility]    Patient Name: Matthew Luna, Matthew Luna MRN:  Procedure Procedures: Panendoscopy (EGD) CPT: 43235.    with esophageal dilation. CPT: G9296129.  Personnel: Endoscopist: Barbette Hair. Arlyce Dice, MD.  Indications  Therapeutics: Reason for exam: Esophageal dilation.  History  Current Medications: Patient, Patient is not currently taking Coumadin.  Comments: Patient history reviewed and updated, pre-procedure physical performed prior to initiation of sedation? Pre-Exam Physical: Performed Aug 05, 2007  Cardio-pulmonary exam, Cardio-pulmonary exam, Cardio-pulmonary exam, HEENT exam, HEENT exam, HEENT exam, Abdominal exam, Abdominal exam WNL. Abdominal exam abnormal. Mental status exam WNL. Abnormal PE findings include: Mild lower abdominal tenderness.  Comments: Patient history reviewed and updated, pre-procedure physical performed prior to initiation of sedation? Exam Exam Info: Maximum depth of insertion Duodenum, intended Duodenum. Vocal cords, Vocal cords visualized. Gastric retroflexion, Gastric retroflexion performed. ASA Classification: II. Tolerance: good.  Sedation Meds: Patient assessed and found to be appropriate for moderate (conscious) sedation. Robinul 0.2 given IV. Fentanyl 50 mcg. given IV. Versed 5 mg. given IV. Cetacaine Spray 2 sprays given aerosolized.  Monitoring: BP and pulse monitoring, BP and pulse monitoring done. Oximetry used., Oximetry used. Supplemental O2 given Supplemental O2 given at 2 Liters. at 2 Liters.  Findings STRICTURE / STENOSIS: Stricture in Distal Esophagus.  Constriction: partial. 40 cm from mouth. ICD9: Esophageal Stricture: 530.3.  - Dilation: Distal Esophagus. Balloon/Microvasive dilator used, Diameter: 16.5-18 mm, Moderate Resistance, Moderate Heme present on extraction. 2  total dilators used. Outcome: successful.  HIATAL HERNIA:  Regular, 3 cms. in length.  - Normal: Body to Duodenal 2nd Portion.   Assessment Abnormal examination, see findings above.  Diagnoses: 530.3: Esophageal Stricture.   Events  Unplanned Intervention: No unplanned interventions were required.  Unplanned Events: , There were no complications. Plans Medication(s): Continue current medications.  Patient Education: Patient given standard instructions for: Hiatal Hernia. Stenosis / Stricture.  Disposition: After procedure patient sent to recovery. After recovery patient sent home.  Scheduling: Office Visit, to [employer]. Arlyce Dice, MD, around Sep 16, 2007.      cc: Tillman Abide, MD   This report was created from the original endoscopy report, which was reviewed and signed by the above listed endoscopist.

## 2010-03-02 NOTE — Letter (Signed)
Summary: Appt Reminder 2  [facility]  [address] [place], [place] [address]   Phone: [phone]  Fax: [phone]        August 13, 2009 MRN: [id number]    [facility] [address] [place], [place]  [address]    Dear Mr. Charette,   You have a return appointment with Dr.Robert Arlyce Dice on 08-25-09 at 3:30pm. Please remember to bring a complete list of the medicines you are taking, your insurance card and your co-pay.  If you have to cancel or reschedule this appointment, please call before 5:00 pm the evening before to avoid a cancellation fee.  If you have any questions or concerns, please call [phone].    Sincerely,    Laureen Ochs LPN  Appended Document: Appt Reminder 2 Letter mailed to patient.

## 2010-03-02 NOTE — Progress Notes (Signed)
Summary: diarrhea  Phone Note Call from Patient Call back at [phone]   Caller: Patient Call For: Dr. Arlyce Dice Reason for Call: Talk to Nurse Summary of Call: diarrhea, thinks colitis flare... would like to discus with a nurse Initial call taken by: Vallarie Mare,  November 09, 2009 11:15 AM  Follow-up for Phone Call        Patient c/o watery stools that started yesterday.  Started taking imodium again today and has some improvement.  Currently not on any prednisone.  C/o cramping and urgency denies rectal bleeding.  Also c/o some upper abdominal bloating.  Dr Arlyce Dice please advise.   Follow-up by: Darcey Nora RN, CGRN,  November 09, 2009 2:01 PM  Additional Follow-up for Phone Call Additional follow up Details #1::        needs C diff toxim ASAP c/b tomorrow with followup.  No pred yet Additional Follow-up by: Louis Meckel MD,  November 09, 2009 2:41 PM    Additional Follow-up for Phone Call Additional follow up Details #2::    Patient  instructed to come by the lab today or tomorrow A.M. to pick up a stool specimen cup for c. diff. Patient  also instructed to call the office tomorrow with an update on how he is doing as per Dr. Arlyce Dice. Patient  states he will come tomorrow to pick up speciman cup. Jesse Fall, RN  Patient  left a voicemail he was returning our call.  I have attempted to reach him again.  I have left him a voicemail Follow-up by: Darcey Nora RN, CGRN,  November 10, 2009 2:33 PM  Additional Follow-up for Phone Call Additional follow up Details #3:: Details for Additional Follow-up Action Taken: I spoke with the patient today.  No more stools since phone call yesterday.  He is much better.  He doesn't even have a stool to leave a sample.  He wanted Dr Arlyce Dice to know he is better.  Okay Additional Follow-up by: Darcey Nora RN, CGRN,  November 10, 2009 3:22 PM

## 2010-03-02 NOTE — Assessment & Plan Note (Signed)
Summary: TINGLING IN LEGS   Vital Signs:  Patient Profile:   70 Years Old Male Weight:      229.25 pounds Temp:     96.8 degrees F oral Pulse rate:   72 / minute BP sitting:   142 / 98  (left arm) Cuff size:   large  Vitals Entered By: Wandra Mannan (May 02, 2007 11:15 AM)                 Chief Complaint:  tingling in legs thinks it the terazosin.  History of Present Illness: Has noted that his eyes are watering Mouth feels sore and stings on gums. No specific lesions Now with pins and needles on both legs No weakness in legs but not as strong as in the past feet stay cold Not related to work or driving  Stopped the terazosin due to dizziness after 1 day Real trouble if he would get up quick--this is some better  Voiding okay but 4-5x/night and was better with the 1 days he took it    Current Allergies (reviewed today): ! FLAGYL (METRONIDAZOLE) ! * TERAZOSIN  Past Medical History:    Reviewed history from 03/22/2007 and no changes required:       Colitis--left sided----------------------------Dr Arlyce Dice       Hypertension       Benign prostatic hypertrophy  Past Surgical History:    Reviewed history from 03/01/2007 and no changes required:       Cholecystectomy 2002       Colonscopy 2003       hernia repair 1982       EGD duodenitis and stricture 03/13/2006       Prostate procedure 1990's--Stoioff   Social History:    Reviewed history from 01/08/2007 and no changes required:       Married--1 son       Never Smoked       Alcohol use-no       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   Eyes:     conjunctiva are clear Mouth:     no erythema and no lesions.   Neck:     supple, no masses, no thyromegaly, no carotid bruits, and no cervical  lymphadenopathy.   Lungs:     normal respiratory effort and normal breath sounds.   Heart:     normal rate, regular rhythm, no murmur, and no gallop.   Pulses:     normal PT on left foot and DP on right Extremities:     no edema or calf tenderness Neurologic:     strength normal in all extremities.   Mild decreased sensation along medial lower calves and slightly infeet Psych:     normally interactive, good eye contact, not anxious appearing, and not depressed appearing.      Impression & Recommendations:  Problem # 1:  NEUROPATHY (ICD-355.9) Assessment: New nonspecific blood work all fine about 1 month ago will check vitamin B12 wtih mouth symptoms, will have him try a multivitamin ??spinal stenosis??? Orders: Venipuncture (82956) TLB-B12, Serum-Total ONLY ([id number])   Problem # 2:  BENIGN PROSTATIC HYPERTROPHY (ICD-600.00) Assessment: Unchanged tryng saw palmetto will consdier flomax or finasteride if not doing well  Problem # 3:  HYPERTENSION (ICD-401.9) Assessment: Unchanged up slightly no change now don't think lisinopril is involved in leg symptoms  The following medications were removed from the medication list:    Terazosin Hcl 5 Mg Caps (Terazosin hcl) .Marland Kitchen... 1 daily at bedtime  His updated medication list for this problem includes:    Lisinopril 10 Mg Tabs (Lisinopril) .Marland Kitchen... 1 daily  BP today: 142/98 Prior BP: 134/86 (03/22/2007)  Labs Reviewed: Creat: 1.0 (10/12/2006)   Complete Medication List: 1)  Lisinopril 10 Mg Tabs (Lisinopril) .Marland Kitchen.. 1 daily   Patient Instructions: 1)  Please start a multivitamin 2)  Call if the prostate gets worse or the nerve problems 3)  Please schedule a follow-up appointment in 3 months.    ] Current Allergies (reviewed today): ! FLAGYL (METRONIDAZOLE) ! * TERAZOSIN

## 2010-03-02 NOTE — Assessment & Plan Note (Signed)
Summary: 8:15 COLITIS/CLE   Vital Signs:  Patient Profile:   70 Years Old Male Weight:      208.38 pounds Temp:     96.9 degrees F oral Pulse rate:   68 / minute BP sitting:   134 / 80  (right arm)  Vitals Entered By: Wandra Mannan (September 18, 2006 8:15 AM)               Chief Complaint:  colitis.  History of Present Illness: Not doing well with his treatment Prednisone, anti-inflammatories(lialda) have not quieted down Just put on imuran a few days ago--just started this Wonders if he needs a second opinion  Has gone onto prednisone 40mg  a day again since his symptoms have not abated No more blood No more pain but still has urgency and his stools have not gotten formed again  No fever Does have an appetite and eats Weight has now stabilized  Very problematic when he gets urgency and he is in his 39 wheeler  Current Allergies (reviewed today): ! FLAGYL (METRONIDAZOLE)  Past Medical History:    Colitis--left sided     Review of Systems      See HPI   Physical Exam  Psych:     normally interactive, good eye contact, and slightly anxious.      Impression & Recommendations:  Problem # 1:  COLITIS, LEFT-SIDED ULCERATIVE (ICD-556.5) Assessment: Improved Improved but not in remission and still on high dose of prednisone Probably needs to push dose of azathioprine depending on his genotype Advised to be patient with correct Rx plan at present  counselled 15 minutes  Complete Medication List: 1)  Lozothiophrine  .Marland Kitchen.. 1 1/2 daily 2)  Cort Enema  .... Daily 3)  Lialda  .... 2 daily   Patient Instructions: 1)  Schedule physical in 4-6 months     Prior Medications (reviewed today): Current Allergies (reviewed today): ! FLAGYL (METRONIDAZOLE) Current Medications (including changes made in today's visit):  * LOZOTHIOPHRINE 1 1/2 daily * CORT ENEMA daily * LIALDA 2 daily

## 2010-03-02 NOTE — Progress Notes (Signed)
Summary: rxn to med  Phone Note Call from Patient Call back at [facility] Phone [phone]   Caller: Patient Call For: letvak Summary of Call: pt seen last week, was given bp med. he c/o rxn to med he c/o nausea, diarrhea, and headaches. pt syas he was on lisinopril 10mg  and he took 1/2 pill daily, since it was changed ti lisinoptil/hctz 10/12.5 he has had rxn, he thinks med is causing a ulcerative colitis flare up. Initial call taken by: Liane Comber,  March 07, 2007 9:47 AM  Follow-up for Phone Call        Have him stop the lisinopril/HCTZ If he was only taking 1/2 of the lisinopril  10--have him restart this and take a full tab keep scheduled follow up Have him call if things don't settle down by next week Follow-up by: Cindee Salt MD,  March 07, 2007 11:23 AM  Additional Follow-up for Phone Call Additional follow up Details #1::        Advised patient.  ......................................................Marland KitchenLiane Comber March 07, 2007 12:39 PM

## 2010-03-02 NOTE — Procedures (Signed)
Summary: Matthew Luna/EGD Report/Dr. Gilda Crease Luna/EGD Report/Dr. Arlyce Dice   Imported By: Eleonore Chiquito 08/09/2007 09:15:00  _____________________________________________________________________  External Attachment:    Type:   Image     Comment:   External Document  Appended Document: Matthew Luna/EGD Report/Dr. Arlyce Dice esophageal stricture dilated

## 2010-03-02 NOTE — Progress Notes (Signed)
Summary: Repeat endo. with balloon dil.  ---- Converted from flag ---- ---- 07/10/2007 9:37 AM, Louis Meckel MD wrote: Needs f/u EGD with balloon dilitation in approximately 3 weeks ------------------------------  Phone Note Outgoing Call   Call placed by: Laureen Ochs LPN,  July 10, 2007 10:00 AM Call placed to: Patient Summary of Call: Scheduled for repeat Endo. with Balloon dil. at [facility] on 08-05-07 at 12:30pm. [facility] Endo. recovery nurse to give pt/pt. wife return appt. instructions. Pt. instructed to call back as needed.  Initial call taken by: Laureen Ochs LPN,  July 10, 2007 10:02 AM

## 2010-03-02 NOTE — Letter (Signed)
Summary: EGD Instructions  [facility]  [address] [place], [place] [address]   Phone: [phone]  Fax: [phone]       Matthew Luna    06/26/1940    MRN: [id number]       Procedure Day /Date:  01-27-09     Arrival Time:  6:45am     Procedure Time: 7:45am     Location of Procedure:                     [facility] ( Outpatient Registration)    PREPARATION FOR ENDOSCOPY   ON THE DAY OF THE PROCEDURE: 01-27-09  1.   No solid foods, milk or milk products are allowed after midnight the night before your procedure.  2.   Do not drink anything colored red or purple.  Avoid juices with pulp.  No orange juice.  3.  You may drink clear liquids until 3:45am, which is 4 hours before your procedure.                                                                                                CLEAR LIQUIDS INCLUDE: Water Jello Ice Popsicles Tea (sugar ok, no milk/cream) Powdered fruit flavored drinks Coffee (sugar ok, no milk/cream) Gatorade Juice: apple, white grape, white cranberry  Lemonade Clear bullion, consomm, broth Carbonated beverages (any kind) Strained chicken noodle soup Hard Candy   MEDICATION INSTRUCTIONS  Unless otherwise instructed, you should take regular prescription medications with a small sip of water as early as possible the morning of your procedure.            OTHER INSTRUCTIONS  You will need a responsible adult at least 69 years of age to accompany you and drive you home.   This person must remain in the waiting room during your procedure.  Wear loose fitting clothing that is easily removed.  Leave jewelry and other valuables at home.  However, you may wish to bring a book to read or an iPod/MP3 player to listen to music as you wait for your procedure to start.  Remove all body piercing jewelry and leave at home.  Total time from sign-in until discharge is approximately 2-3 hours.  You should go home directly  after your procedure and rest.  You can resume normal activities the day after your procedure.  The day of your procedure you should not:   Drive   Make legal decisions   Operate machinery   Drink alcohol   Return to work  You will receive specific instructions about eating, activities and medications before you leave.    The above instructions have been reviewed and explained to [name] by phone and mailed to him.   Laureen Ochs LPN  January 18, 2009 4:09 PM       Appended Document: EGD Instructions Letter mailed to patient.

## 2010-03-02 NOTE — Procedures (Signed)
Summary: EGD with Dilation   EGD  Procedure date:  07/10/2007  Findings:      Findings: Stricture:   Patient Name: Matthew Luna, Matthew Luna MRN:  Procedure Procedures: Panendoscopy (EGD) CPT: 43235.    with esophageal dilation. CPT: G9296129.  Personnel: Endoscopist: Barbette Hair. Arlyce Dice, MD.  Indications Symptoms: Dysphagia.  Comments: Recent food impaction History  Current Medications: Patient, Patient is not currently taking Coumadin.  Comments: Patient history reviewed and updated, pre-procedure physical performed prior to initiation of sedation? Pre-Exam Physical: Performed Jul 10, 2007  Cardio-pulmonary exam, Cardio-pulmonary exam, Cardio-pulmonary exam, HEENT exam, HEENT exam, HEENT exam, Abdominal exam, Abdominal exam WNL. Abdominal exam abnormal. Mental status exam WNL. Abnormal PE findings include: Mild lower abdominal tenderness.  Comments: Patient history reviewed and updated, pre-procedure physical performed prior to initiation of sedation? Exam Exam Info: Maximum depth of insertion Duodenum, intended Duodenum. Vocal cords, Vocal cords visualized. Gastric retroflexion, Gastric retroflexion performed. ASA Classification: II. Tolerance: good.  Sedation Meds: Patient assessed and found to be appropriate for moderate (conscious) sedation. Robinul 0.2 given IV. Fentanyl 50 mcg. given IV. Versed 5 mg. given IV. Cetacaine Spray 2 sprays given aerosolized.  Monitoring: BP and pulse monitoring, BP and pulse monitoring done. Oximetry used., Oximetry used. Supplemental O2 given Supplemental O2 given at 2 Liters. at 2 Liters.  Findings HIATAL HERNIA: Regular, 2 cms. in length.  STRICTURE / STENOSIS: Stricture in Distal Esophagus.  Constriction: partial. 40 cm from mouth. ICD9: Esophageal Stricture: 530.3. Comment: Minimal bleeding with passage of endoscope.  - Dilation: Distal Esophagus. Balloon/Microvasive dilator used, Diameter: 15-16.5 mm, Moderate Resistance, Moderate  Heme present on extraction. 2  total dilators used. Outcome: successful.  - Normal: Fundus to Antrum.  - MUCOSAL ABNORMALITY: Duodenal Bulb. Erythematous mucosa. ICD9: Duodenitis without Hemorrhage: 535.60. Comment: Mild erythema.  - Normal: Duodenal Apex to Duodenal 2nd Portion.   Assessment Abnormal examination, see findings above.  Diagnoses: 530.3: Esophageal Stricture.  535.60: Duodenitis without Hemorrhage.   Events  Unplanned Intervention: No unplanned interventions were required.  Unplanned Events: , There were no complications. Plans Patient Education: Patient given standard instructions for: Stenosis / Stricture. Mucosal Abnormality.  Disposition: After procedure patient sent to recovery. After recovery patient sent home.  Scheduling: EGD, to Molly Maduro D. Arlyce Dice, MD, balloon dilitation around Jul 31, 2007.      Appended Document: EGD with Dilation For repeat endo/ball.dil. at [facility] on 08-05-07 at 12:30pm. [facility] endo. recovery nurse will give pt./pt. wife instructions for return appt. pt. to call as needed.

## 2010-03-02 NOTE — Progress Notes (Signed)
Summary: Triage  Phone Note Call from Patient Call back at Home Phone [phone]   Caller: Patient Call For: Dr.Kaplan Summary of Call: Pt. calling with c/o dysphagia.  Initial call taken by: Laureen Ochs LPN,  July 27, 2009 5:06 PM  Follow-up for Phone Call        Message left for patient to callback. Laureen Ochs LPN  July 28, 2009 8:49 AM   Last OV 12-09-2006, last Endo/Dil. 01-27-09. Went to ER last night in [place] due to severe diarrhea, dehydration. Was told his Colitis was flared up and he may have C-Diff. Was given Vanco. 125mg  QID for 10 days.  Pt. will see Dr.Kaplan today at 2:30pm. Follow-up by: Laureen Ochs LPN,  July 28, 2009 12:01 PM

## 2010-03-02 NOTE — Progress Notes (Signed)
Summary: TRIAGE  Phone Note Call from Patient Call back at Home Phone [phone] Call back at [phone]   Caller: Patient Call For: Dr. Arlyce Dice Reason for Call: Talk to Nurse Summary of Call: nausea, thinks from meds Initial call taken by: Vallarie Mare,  August 10, 2009 3:37 PM  Follow-up for Phone Call        Was seen 07-28-09.  Started on Vancomycin, states his diarrhea subsided, completed 9 days of a 10 course of Vanco.  Pt. calling with c/o 10 days of increased indigestion, increased bloating/belching, fullness, sour stomache. Denies dysphagia, n/v, fever.  1) Prevacid 30mg  two times a day for  5 days, then once daily for 5 days. Then Prevacid 15mg  OTC daily.  2) Gas-x,Phazyme, etc. as needed for gas & bloating. 3) Soft,bland diet. No spicy,greasy,fried foods for 5 days,  Advance diet as tolerated. 4) If symptoms become worse call back immediately. 5) I will call pt., if new orders, after MD reviews.    Follow-up by: Laureen Ochs LPN,  August 10, 2009 4:18 PM  Additional Follow-up for Phone Call Additional follow up Details #1::        ok Additional Follow-up by: Louis Meckel MD,  August 11, 2009 8:59 AM

## 2010-03-02 NOTE — Progress Notes (Signed)
Summary: NAUSEA/DIARRHEA  Phone Note Call from Patient Call back at Home Phone [phone] Call back at Work Phone [phone]   Caller: Patient Summary of Call: Pt. states he would like something for nausea. Also, his diarrhea has returned, has had 5 watery stools today. "I feel like crap!"  Pt. advised to re-start  the Vanco.,  he stopped it 1 day short of taking the 10 days worth. I will callback as soon as Dr.Encarnacion Bole advises as to how much longer he needs to take it.   [facility] PLEASE ADVISE Initial call taken by: Laureen Ochs LPN,  August 11, 2009 10:41 AM  Follow-up for Phone Call        schedule f sig for am Follow-up by: Louis Meckel MD,  August 12, 2009 9:39 AM  Additional Follow-up for Phone Call Additional follow up Details #1::        Above MD orders reviewed with patient. Pt. is scheduled for a flex. at [facility] on 08-13-09 at 8:30am. All prep. instructions reviewed w/pt. by phone. Pt. instructed to call back as needed.  Additional Follow-up by: Laureen Ochs LPN,  August 12, 2009 10:03 AM

## 2010-03-02 NOTE — Assessment & Plan Note (Signed)
Summary: 8:30 ?RASH/CLE   Vital Signs:  Patient Profile:   70 Years Old Male Weight:      226.13 pounds Temp:     97.6 degrees F oral Pulse rate:   72 / minute BP sitting:   128 / 82  (left arm) Cuff size:   large  Vitals Entered By: Wandra Mannan (January 08, 2007 8:22 AM)                 Chief Complaint:  rash.  History of Present Illness: Used bathroom in [place] stop and sat on toilet seat Felt he got redness around tailbone since then Trued Cruex and antifungal cream--no effect There for 3-4 weeks  Colitis has been quiet Off all meds except probiotics  Stomach still rolls at times--just uses gas-x as needed   Current Allergies (reviewed today): ! FLAGYL (METRONIDAZOLE)  Past Medical History:    Reviewed history from 09/18/2006 and no changes required:       Colitis--left sided  Past Surgical History:    Reviewed history from 04/26/2006 and no changes required:       Cholecystectomy 2002       Colonscopy 2003       hernia repair 1982       EGD duodenitis and stricture 03/13/2006   Social History:    Married--1 son    Never Smoked    Alcohol use-no    Occupation: Works for [employer])   Risk Factors:  Tobacco use:  never Alcohol use:  no   Review of Systems      See HPI   Physical Exam  General:     alert and normal appearance.   Rectal:     perirectal erythema No apparent fistula    Impression & Recommendations:  Problem # 1:  RASH-NONVESICULAR (ICD-782.1) Assessment: New perirectal Doesn't look infectious Doesn't seem to have anything to do with colitis  Complete Medication List: 1)  Anusol-hc 2.5 % Crea (Hydrocortisone) .... Apply to rectal area three times a day as needed for itching   Patient Instructions: 1)  Please schedule a follow-up appointment as needed.    Prescriptions: ANUSOL-HC 2.5 %  CREA (HYDROCORTISONE) apply to rectal area three times a day as needed for itching  #1 tube x 1   Entered  and Authorized by:   Cindee Salt MD   Signed by:   Cindee Salt MD on 01/08/2007   Method used:   Electronically sent to ...       [facility]*       [address]       [place], [place]  [address]       Ph: [phone] or [phone]       Fax: [phone]   RxID:   [id number]  ] Current Allergies (reviewed today): ! FLAGYL (METRONIDAZOLE) Current Medications (including changes made in today's visit):  ANUSOL-HC 2.5 %  CREA (HYDROCORTISONE) apply to rectal area three times a day as needed for itching   Appended Document: 8:30 ?RASH/CLE copy to Dr Arlyce Dice  Appended Document: 8:30 ?RASH/CLE Office Note faxed to Dr. Arlyce Dice.

## 2010-03-02 NOTE — Progress Notes (Signed)
Summary: schedule endo/dil.  ---- Converted from flag ---- ---- 07/08/2007 4:36 PM, Louis Meckel MD wrote: Please schedule EGD with balloon dilitation for Matthew Luna for this week. ------------------------------  Phone Note Outgoing Call   Call placed by: Laureen Ochs LPN,  July 08, 1608 8:36 AM Call placed to: Patient Summary of Call: Pt. scheduled for an Endoscopy with balloon dil. at [facility] for 07-10-07 at 9am. All prep. instructions reviewed with [name] by phone. Pt. instructed to call back as needed.  Initial call taken by: Laureen Ochs LPN,  July 09, 9602 8:43 AM

## 2010-03-02 NOTE — Procedures (Signed)
Summary: Colonoscopy & Pathology  Gastroenterology Colon   Imported By: Christie Nottingham 02/28/2007 11:49:55  _____________________________________________________________________  External Attachment:    Type:   Image     Comment:   External Document  Appended Document: Gastroenterology Colon SP Surgical Pathology - STATUS: Final             By: Almyra Free MD , Malcolm Metro        Perform Date: 8 Feb07 00:01  Ordered By: Dennard Nip,         Ordered Date: 8 Feb07 22:47  Facility: [facility]                               Department: CPATH  Service Report Text  [facility]   [address] [address]   [place], [place] [address]   Telephone [phone] or [phone] Fax [phone]    REPORT OF SURGICAL PATHOLOGY    Case #: [id number]   Patient Name: Matthew Luna, Matthew Luna   PID: [id number]   Pathologist: Cira Rue L. Almyra Free, MD   DOB/Age 16-Feb-1940 (Age: 29) Gender: M   Date Taken: 03/09/2005   Date Received: 03/09/2005    FINAL DIAGNOSIS    ***MICROSCOPIC EXAMINATION AND DIAGNOSIS***    ENDOSOPIC BIOPSY, RANDOM COLON: BENIGN COLONIC MUCOSA WITH   NON-SPECIFIC CHANGES, SEE COMMENT.    COMMENT   There is a mild increase in chronic inflammatory cells within the   epithelium including at the surface and an overall increase in   the inflammatory infiltrate in the lamina propria. The findings   are non-specific. Definitive features of microscopic colitis are   not identified. Features of active colitis or dysplasia are not   seen. (ALL:glk 03-10-05)    gk   Date Reported: 03/10/2005 Arlene L. Almyra Free, MD   *** Electronically Signed Out By ALL ***    Clinical information   Diarrhea Heme + stools R/O microscopic colitis (jes)    specimen(s) obtained   Colon, biopsy, random    Gross Description   Received in formalin are tan, soft tissue fragments that are   submitted in toto. Number: multiple   Size: 0.2 to 0.4 cm one block (BJ:jes, 03/10/05)    jes/

## 2010-03-02 NOTE — Procedures (Signed)
Summary: Upper Endoscopy w/DIL  Patient: [name] Note: All result statuses are Final unless otherwise noted.  Tests: (1) Upper Endoscopy w/DIL (UED)  UED Upper Endoscopy w/DIL                             DONE     [facility]     [address] [place], [place]  [address]           ENDOSCOPY PROCEDURE REPORT           PATIENT:  Matthew, Luna  MR#:  [id number]     BIRTHDATE:  22-Jun-1940, 68 yrs. old  GENDER:  male           ENDOSCOPIST:  Barbette Hair. Arlyce Dice, MD     ASSISTANT:           PROCEDURE DATE:  01/27/2009     PROCEDURE:  EGD with balloon dilatation     ASA CLASS:  ClassII     INDICATIONS:  1) dysphagia           MEDICATIONS:   Fentanyl 50 mcg, Versed 5 mg IV     TOPICAL ANESTHETIC:  Cetacaine Spray           DESCRIPTION OF PROCEDURE:   After the risks benefits and     alternatives of the procedure were thoroughly explained, informed     consent was obtained.  The EG-[id number] ([id number]) endoscope was     introduced through the mouth and advanced to the third portion of     the duodenum, without limitations.  The instrument was slowly     withdrawn as the mucosa was carefully examined.     <<PROCEDUREIMAGES>>           A stricture was found at the gastroesophageal junction (see     image001). Moderate stricture  The examination was otherwise     normal.    Dilation was then performed at the gastroesphageal     junction           1) Dilator:  Balloon  Size(s):  15-16.5-18     Resistance:  moderate  Heme:  yes     Appearance:     Minimal amount of heme           COMPLICATIONS:  None           ENDOSCOPIC IMPRESSION:     1) Stricture at the gastroesophageal junction - s/p balloon     dilitation     2) Otherwise normal examination.     RECOMMENDATIONS:     1) dilatations PRN           REPEAT EXAM:  No           ______________________________     Barbette Hair. Arlyce Dice, MD           CC:           n.     eSIGNED:   Barbette Hair. Wilhelmenia Addis at 01/27/2009 08:07  AM           Gillermo Murdoch, [id number]  Note: An exclamation mark (!) indicates a result that was not dispersed into the flowsheet. Document Creation Date: 01/27/2009 8:07 AM _______________________________________________________________________  (1) Order result status: Final Collection or observation date-time: 01/27/2009 08:04 Requested date-time:  Receipt date-time:  Reported date-time:  Referring Physician:   Ordering Physician: Melvia Heaps [phone]) Specimen Source:  Source: Launa Grill Order Number: [id number] Lab site:

## 2010-03-02 NOTE — Miscellaneous (Signed)
  Clinical Lists Changes  Medications: Changed medication from VANCOCIN HCL 125 MG CAPS (VANCOMYCIN HCL) one capsule by mouth four times a day for ten days to [facility] HCL 125 MG CAPS (VANCOMYCIN HCL) take 1 tab qid for 2 weeks then 1 tab three times a day for 1 week then 1 tab two times a day for 1 week then 1 tab once daily for 1 week - Signed Rx of VANCOCIN HCL 125 MG CAPS (VANCOMYCIN HCL) take 1 tab qid for 2 weeks then 1 tab three times a day for 1 week then 1 tab two times a day for 1 week then 1 tab once daily for 1 week;  #75 x 1;  Signed;  Entered by: Louis Meckel MD;  Authorized by: Louis Meckel MD;  Method used: Electronically to [facility] [address] [place], [place], [place]  [address], Ph: [phone], Fax: [phone]    Prescriptions: VANCOCIN HCL 125 MG CAPS (VANCOMYCIN HCL) take 1 tab qid for 2 weeks then 1 tab three times a day for 1 week then 1 tab two times a day for 1 week then 1 tab once daily for 1 week  #75 x 1   Entered and Authorized by:   Louis Meckel MD   Signed by:   Louis Meckel MD on 08/13/2009   Method used:   Electronically to        [facility] [address] (retail)       [address]       [place], [place]  [address]       Ph: [phone]       Fax: [phone]   RxID:   [id number]

## 2010-06-14 NOTE — Assessment & Plan Note (Signed)
[facility]                         GASTROENTEROLOGY OFFICE NOTE   SALOME, COZBY                       MRN:          [id number]  DATE:10/22/2006                            DOB:          1940-02-26    PROBLEM LIST:  Colitis.   Mr. Groninger continues to improve.  He is off Lialda and is on cort enemas  nightly and prednisone 30 mg a day.  Stools are forming.  He has had two  episodes of urgency in the past week.  He still complains of severe  fatigue.  Recent lab work on October 12, 2006, was pertinent for  normal CBC with total protein 5.9 and albumin of 3.1.   OBJECTIVE:  VITAL SIGNS:  Pulse 72, blood pressure 110/74, weight 211.   IMPRESSION:  Left-sided colitis, clearly worsened with Lialda.   RECOMMENDATIONS:  1. Switch prednisone to nightly.  He will lower to 20 mg.  If stable,      he will lower it to 15 mg in 5 days.  2. Continue cort enemas.     Barbette Hair. Arlyce Dice, MD,FACG  Electronically Signed    RDK/MedQ  DD: 10/22/2006  DT: 10/23/2006  Job #: [id number]

## 2010-06-14 NOTE — Assessment & Plan Note (Signed)
[facility]                         GASTROENTEROLOGY OFFICE NOTE   STEDMAN, SUMMERVILLE                       MRN:          [id number]  DATE:12/19/2006                            DOB:          [birth year]    PROBLEM LIST:  Colitis.   Matthew Luna has returned for a scheduled followup. He is off of all  medicines. He currently is feeling quite well. Bowel movements are solid  and normal. He was complaining of abdominal bloating and discomfort  which subsided after he discontinued his Protonix.   PHYSICAL EXAMINATION:  Pulse 88, blood pressure 138/88, weight 223.   IMPRESSION:  1. Left-sided colitis-in remission.  2. INTOLERANCE TO LIALDA.   RECOMMENDATIONS:  No medications at this time (the patient is taking a  probiotic daily). Should he have a flare, I will first try cort enemas  and then Rowasa enemas if necessary, trying to avoid systemic  medication.     Barbette Hair. Arlyce Dice, MD,FACG  Electronically Signed    RDK/MedQ  DD: 12/19/2006  DT: 12/19/2006  Job #: [id number]   cc:   Karie Schwalbe, MD

## 2010-06-14 NOTE — Assessment & Plan Note (Signed)
[facility]                         GASTROENTEROLOGY OFFICE NOTE   LATRON, RIBAS                       MRN:          [id number]  DATE:11/05/2006                            DOB:          1940-05-16    PROBLEM:  Left sided colitis.   SUBJECTIVE:  Matthew Luna has returned for scheduled followup.  He  continues to improve.  Stools are now solid.  He is having no bleeding.  He has occasional aching in his upper abdomen.  He continues on a  prednisone taper and is currently down to 10 mg a day.  Lialda has been  discontinued.   PHYSICAL EXAMINATION:  VITAL SIGNS:  Pulse 72.  Blood pressure 118/76.  Weight 213.   IMPRESSION:  Left sided colitis.  His symptoms clearly were exacerbated  by Lialda.   RECOMMENDATIONS:  1. Continue prednisone taper by 2.5 mg weekly as well as his Cort      enemas.  2. Empiric therapy for dyspepsia with Protonix 40 mg a day.     Barbette Hair. Arlyce Dice, MD,FACG  Electronically Signed    RDK/MedQ  DD: 11/05/2006  DT: 11/05/2006  Job #: [id number]   cc:   Karie Schwalbe, MD

## 2010-06-14 NOTE — Assessment & Plan Note (Signed)
 HEALTHCARE                         GASTROENTEROLOGY OFFICE NOTE   NORMAL, RECINOS                       MRN:          [id number]  DATE:10/12/2006                            DOB:          01-Jan-1941    PROBLEM:  Diarrhea.   Matthew Luna has returned for further evaluation.  He continues to feel  quite poorly.  He is having multiple bowel movements with severe  urgency.  He remains on Rowasa  enemas, Cort enemas, 30 mg prednisone  daily, and Lialda 2.4 g a day.  He discontinued Imuran since he did not  tolerate it.  He is increasingly weak.  Sigmoidoscopy demonstrated just  mild inflammatory changes in the left colon up to the area that was  examined.   EXAM:  Pulse 76, blood pressure 110/72, weight 209.   IMPRESSION:  Persistent severe symptoms even in the face of only mild to  moderate inflammation.  This raises the question of drug-induced  symptoms (mesalamine) versus persistent colitis.   RECOMMENDATIONS:  Discontinue Lialda.  If he is not significantly  improved in 3 to 4 days, then I will admit him for bowel rest and  intravenous steroids.     Barbette Hair. Arlyce Dice, MD,FACG  Electronically Signed    RDK/MedQ  DD: 10/12/2006  DT: 10/13/2006  Job #: [id number]   cc:   Karie Schwalbe, MD

## 2010-06-14 NOTE — Assessment & Plan Note (Signed)
[facility]                         GASTROENTEROLOGY OFFICE NOTE   Matthew Luna, Matthew Luna                       MRN:          [id number]  DATE:08/07/2006                            DOB:          [birth year]    PROBLEM:  Left-sided colitis.   Matthew Luna has returned.  He has been in a recent flare characterized by  frequent bowel movements with urgency.  Lialda was increased to 4.8 gm a  day.  Last week ___cort_______ enemes were added, as well as mesalamine  enemas.  He reports that there is some improvement of the diarrhea,  although it continues.  He may have 4 to 5 loose stools a day with  urgency.  He has had some crampy abdominal pain.   EXAMINATION:  Pulse 88.  Blood pressure 110/72.  Weight 214.   IMPRESSION:  Left-sided colitis with recent flare.   RECOMMENDATIONS:  Patient will consider enrollment in an IBD trial.  Failing that, I would start him on prednisone at 20 mg a day increase if  not responsive after 3 days.     Barbette Hair. Arlyce Dice, MD,FACG  Electronically Signed    RDK/MedQ  DD: 08/07/2006  DT: 08/07/2006  Job #: [id number]

## 2010-06-14 NOTE — Assessment & Plan Note (Signed)
[facility]                         GASTROENTEROLOGY OFFICE NOTE   FINES, KIMBERLIN                       MRN:          [id number]  DATE:10/08/2006                            DOB:          07/24/1940    PROBLEM:  Left-sided colitis.   [name] has returned for ongoing therapy.  Despite prednisone up to 40  mg a day, he still has severe urgency with loose stools.  He lowered the  Lialda to 2.4 g a day.  He was started on Imuran 75 mg a day and then  increased to 150 mg a day after his TPMT activity was noted to be  normal.  Unfortunately, he did not tolerate this.  Imuran produced  severe headaches, prostration, diaphoresis, and chest discomfort.  These  symptoms entirely subsided after he discontinued the Imuran.  He has  been on no recent antibiotics.  Last sigmoidoscopy in January 2008  demonstrated an active left-sided colitis.   EXAM:  Pulse 88, blood pressure 120/88, weight 210.   IMPRESSION:  Continued active colitis by symptomatology.  This is  despite therapy with Lialda and prednisone.  He clearly did not tolerate  Imuran.  Intercurrent infection, such as pseudomembranous colitis is a  consideration.   RECOMMENDATIONS:  1. Sigmoidoscopy.  2. To consider bowel rest and intravenous steroids.  3. To consider anti-TNF therapy pending results of above.     Barbette Hair. Arlyce Dice, MD,FACG  Electronically Signed    RDK/MedQ  DD: 10/08/2006  DT: 10/09/2006  Job #: [id number]   cc:   Karie Schwalbe, MD

## 2010-06-14 NOTE — Assessment & Plan Note (Signed)
[facility]                         GASTROENTEROLOGY OFFICE NOTE   JESTON, JUNKINS                       MRN:          [id number]  DATE:10/15/2006                            DOB:          [birth year]    PROBLEM:  Diarrhea recently.   Mr. Cart has returned for scheduled followup.  He stopped his Lialda 3  days ago.  He reports improvement in his diarrhea.  He has had 3 bowel  movements today; although they are tending to be formed, he still has  severe urgency.  He remains on Cortenemas, Rowasa enemas and prednisone  30 mg a day.   Lab work was pertinent for normal CAROTID BRUIT.  His albumin was 3.1  with normal 3.5 to 5.2.   He complain of severe fatigue.   PHYSICAL EXAMINATION:  Pulse 84, blood pressure 120/80, weight 209.   IMPRESSION:  Left-sided colitis.  I am suspicious that his symptoms of  diarrhea and urgency are related to the Lialda.  Sigmoidoscopy did not  demonstrate a severely active colitis.   RECOMMENDATIONS:  Continue holding Lialda.  I will also discontinue his  Rowasa enemas and keep him on prednisone 30 mg a day and Cortenemas  nightly.  He will call back in 2 days and return to the office in 1  week.     Barbette Hair. Arlyce Dice, MD,FACG  Electronically Signed    RDK/MedQ  DD: 10/15/2006  DT: 10/16/2006  Job #: [id number]   cc:   Karie Schwalbe, MD

## 2010-06-14 NOTE — Assessment & Plan Note (Signed)
[facility]                         GASTROENTEROLOGY OFFICE NOTE   Matthew Luna, Matthew Luna                       MRN:          [id number]  DATE:09/12/2006                            DOB:          Oct 25, 1940    PROBLEM:  Left-sided colitis.   Matthew Luna has returned for reevaluation.  His symptoms have flared when  he has lowered the prednisone below 30 mg.  This is characterized by  severe urgency and diarrhea.  He is currently on Lialda 4.8 grams a day  and prednisone 15 mg a day.  He has active left-sided colitis.   On exam, pulse 80, blood pressure 110/80, weight 211.   IMPRESSION:  Persistent steroid-dependent left-sided colitis.   RECOMMENDATIONS:  1. Begin Imuran 75 mg a day while checking his TMP phenotype.  2. Decrease Lialda to 2.4 grams a day.  3. Begin Cort enemas q.h.s.     Barbette Hair. Arlyce Dice, MD,FACG  Electronically Signed    RDK/MedQ  DD: 09/12/2006  DT: 09/13/2006  Job #: [id number]

## 2010-06-17 NOTE — Assessment & Plan Note (Signed)
[facility]                         GASTROENTEROLOGY OFFICE NOTE   Matthew Luna, Matthew Luna                       MRN:          [id number]  DATE:02/09/2006                            DOB:          01/27/1941    PROBLEM:  Diarrhea.   REASON:  Matthew Luna has returned again complaining of diarrhea. Over the  past 2 weeks, he has been complaining of severe watery diarrhea mixed  with blood. He has had crampy and sharp lower abdominal pain. He has had  at least 2 ER visits where he was placed on Unasyn and, more recently,  Bactrim. There has been no improvement. He is weak and feels dehydrated.  He is also complaining of throat swelling, which he feels was due to his  UNASYN. HE DOES HAVE A HISTORY OF ALLERGY TO FLAGYL. He is without  fever. Colonoscopy in February 2007 was remarkable for diffuse  diverticulosis only. He has had complaints of diarrhea in the past with  heme positive stool. No diagnosis was made. Microscopic colitis was  ruled out. Matthew Luna also complains of dysphagia to solids. This has  been a more chronic problem. He has occasional pyrosis.   CT of the abdomen and the pelvis demonstrated borderline sigmoid wall  thickening and increased reactive lymph nodes. CBC was unremarkable.   PHYSICAL EXAMINATION:  On exam, he is an ill-appearing male, pulse 80,  blood pressure 110/78, weight 212.  HEENT: EOMI. PERRLA. Sclerae are anicteric.  Conjunctivae are pink.  NECK: Supple without thyromegaly, adenopathy or carotid bruits.  CHEST: Clear to auscultation and percussion without adventitious sounds.  CARDIAC:  Regular rhythm; normal S1 S2.  There are no murmurs, gallops  or rubs.  ABDOMEN:  He has mild diffuse lower abdominal tenderness without  guarding or rebound. There are no abdominal masses or organomegaly.  EXTREMITIES:  Full range of motion.  No cyanosis, clubbing or edema.  RECTAL: There are no masses.  Stool is Hemoccult negative.   IMPRESSION:  1. Diarrhea. I suspect that he has an inflammatory colitis, not      diverticulitis.  2. Dysphagia - rule out esophageal stricture.  3. ALLERGY TO FLAGYL AND UNASYN.   RECOMMENDATIONS:  1. Stat sigmoidoscopy.  2. Upper endoscopy with esophageal dilation at a later point.     Barbette Hair. Arlyce Dice, MD,FACG  Electronically Signed    RDK/MedQ  DD: 02/09/2006  DT: 02/09/2006  Job #: [id number]   cc:   Karie Schwalbe, MD

## 2010-06-17 NOTE — Assessment & Plan Note (Signed)
[facility]                         GASTROENTEROLOGY OFFICE NOTE   Matthew, Luna                       MRN:          [id number]  DATE:02/16/2006                            DOB:          02-06-1940    PROBLEM:  Left-sided colitis.   [name] has returned for interval checkup.  Sigmoidoscopy demonstrated  an active left-sided colitis.  Biopsies confirmed this.  On a regimen of  Lialda 4.8 g daily, and prednisone 40 mg a day, he is significantly  improved.  Stools have become normal.   PHYSICAL EXAMINATION:  Pulse 80, blood pressure 128/88, weight 217.   IMPRESSION:  Colitis.  It is noteworthy that his colitis extended as far  as the sigmoidoscope could examine.  Presumably, this is a pan-colitis.   RECOMMENDATIONS:  1. Continue Lialda.  2. Prednisone taper over the next few weeks.     Barbette Hair. Arlyce Dice, MD,FACG  Electronically Signed    RDK/MedQ  DD: 02/16/2006  DT: 02/16/2006  Job #: [id number]

## 2010-06-17 NOTE — Assessment & Plan Note (Signed)
[facility]                         GASTROENTEROLOGY OFFICE NOTE   JUSTICE, MILLIRON                       MRN:          [id number]  DATE:04/24/2006                            DOB:          Mar 25, 1940    PROBLEM:  Left sided colitis.   REASON:  Mr. Creswell has returned for scheduled followup.  He has  discontinued his prednisone last night.  Diarrhea has entirely subsided.  He is taking 3.6 grams of Lialda a day.  He does complain of some  nausea, especially in the afternoon.   On exam, pulse 72, blood pressure 122/84, weight 214.   IMPRESSION:  1. Pancolitis - now in remission.  2. Nausea - possibly secondary to his medications.   RECOMMENDATIONS:  1. Continue Lialda.  2. Trial of Protonix 40 mg daily.  3. Antacids p.r.n. nausea.     Barbette Hair. Arlyce Dice, MD,FACG  Electronically Signed    RDK/MedQ  DD: 04/25/2006  DT: 04/25/2006  Job #: [id number]

## 2010-06-17 NOTE — Assessment & Plan Note (Signed)
[facility]                         GASTROENTEROLOGY OFFICE NOTE   PATRICIA, PERALES                       MRN:          [id number]  DATE:05/16/2006                            DOB:          [birth year]    PROBLEM:  Left-sided colitis.   HISTORY OF PRESENT ILLNESS:  Mr. Consalvo has returned for scheduled follow-  up.  He is doing well except for new complaints of urinary frequency.  He believes this has started since taking Lialda.  It is especially  severe at night.  Colitis has remained quiet.  He is having normal bowel  movements.  Pyrosis has quieted down with Protonix.   PHYSICAL EXAMINATION:  VITAL SIGNS:  Pulse 60, blood pressure 120/80,  weight 216.   IMPRESSION:  1. Left-sided - in remission.  2. Urinary frequency - possibly secondary to Lialda.   RECOMMENDATIONS:  1. Reduce Lialda to 2.4 g daily.  If urinary frequency is not      improved, then I will try him on Asacol.  2. Continue Protonix.     Barbette Hair. Arlyce Dice, MD,FACG  Electronically Signed    RDK/MedQ  DD: 05/16/2006  DT: 05/16/2006  Job #: [id number]

## 2010-06-17 NOTE — Discharge Summary (Signed)
NAMEZEIN, HELBING NO.:  [id number]   MEDICAL RECORD NO.:  [id number]          PATIENT TYPE:  INP   LOCATION:  2002                         FACILITY:  [facility]   PHYSICIAN:  Alvester Morin, M.D.  DATE OF BIRTH:  1940/09/25   DATE OF ADMISSION:  01/24/2005  DATE OF DISCHARGE:  01/26/2005                                 DISCHARGE SUMMARY   CONDITION AT DISCHARGE:  Stable with improved laryngeal edema.   INSTRUCTIONS ON DISCHARGE:  To complete a prednisone taper and follow up  with primary care physician as well as the GI specialist.  Mr. Donley has  been instructed NOT TO TAKE FLAGYL (METRONIDAZOLE) as this causes severe  allergic reactions.   DISCHARGE DIAGNOSES:  1.  ANAPHYLACTIC REACTION TO FLAGYL.  2.  Chronic diarrhea.  3.  History of laparoscopic cholecystectomy.  4.  Colonoscopy five years ago.  5.  Diverticulosis by colonoscopy.   MEDICATIONS AT DISCHARGE:  1.  Prednisone 40 mg daily x2 days, then 30 mg daily x2 days, then 20 mg      daily x2 days, then 10 mg daily x2 days, then stop.  2.  Benadryl 50 mg every 6 hours for 2 more days.  Can stop if swelling has      resolved.  3.  Zantac 150 mg b.i.d. for 1 more week.  4.  DO NOT TAKE FLAGYL.   PROCEDURES:  1.  January 24, 2005:  Plain film of the neck.  IMPRESSION:  1.  Suggestion of mild thickening in epiglottis.  2.  Enlarged uvula.  3.  Cervical spine degenerative changes.   1.  January 24, 2005:  Chest x-ray.  IMPRESSION:  No acute cardiopulmonary disease.   1.  January 24, 2005:  CT of the abdomen and pelvis.  IMPRESSION:  1.  No acute process in the abdomen.  2.  Small hiatal hernia with probable distal esophageal motility.  3.  Two small uncharacterized left renal lesions, larger lesion at 9 mm,      could be more entirely evaluated on dedicated renal ultrasound.  4.  Renal sinus cyst, but no hydronephrosis.  5.  Probable monotropic renal calculi, which were incompletely evaluated  on      subsequent study.  6.  Borderline sigmoid wall consistent with heterogeneous enhancement.      Findings likely  represent either a very mild colitis or early CAS or      the residua of diverticulitis with marked abnormality is pericolonic.  7.  Increased number of likely reactive lymph nodes would suggest a more      __________ transmural inflammatory process.  Of note, the rectum appears      relatively uninvolved as does the terminal ileum.  These findings are      __________ inflammatory bowel disease as per the patient's age.   BRIEF HISTORY AND PHYSICAL:  This is a 70 year old man with a past medical  history of diverticulosis by colonoscopy five years ago in [place] and a  history of chronic diarrhea for two years treated on and off with  antibiotics, who presented to the emergency department with hoarseness,  swelling of the neck and throat, and a rash that developed one to two days  after starting Flagyl.  The patient's diarrhea had worsened over the past  two months, and he was seen by his nurse practitioner in [place] one week  prior to admission when he was given a prescription for Flagyl and Cipro.  He began taking the antibiotics three days prior to admission and began to  develop a rash on his thighs, face, and arms.  He also noted a soreness of  his throat and some swelling of his face and tongue.  He denies any fever or  chills.  He does note that the diarrhea has become more intense over the  past two days with some blood in the stool.   PHYSICAL EXAMINATION ON ADMISSION:  VITAL SIGNS:  Temperature 100.5, blood  pressure 110/78, pulse 106, respiratory rate 18, oxygen saturation 98% on  room air.  GENERAL:  He is in no acute distress.  EYES:  Equal, round, and reactive to light.  Extraocular movements intact.  ENT:  Hoarseness, prominent edema of the uvula and posterior pharynx.  No  exudates.  NECK:  Supple.  Anterior neck edema.  LUNGS:  Clear to  auscultation bilaterally.  No wheezes, no rhonchi.  CARDIOVASCULAR:  Regular rate and rhythm.  ABDOMEN:  Positive bowel sounds, soft and nondistended, mild tenderness in  the suprapubic region and right lower quadrant.  SKIN:  Urticaria on the abdomen, thigh, and tibial area.  LYMPH:  No lymphadenopathy.  NEURO:  Alert and oriented x4.  Cranial nerves II-XII intact.  Muscle  strength 5/5.  Sensory is 5/5.  No meningeal signs.   LABORATORY DATA ON ADMISSION:  Sodium 138, potassium 3.4, chloride 104,  bicarb 24, BUN 16, creatinine 1.2, glucose 128, magnesium 1.8, white count  8.1, hemoglobin 14.8, platelets 322, bilirubin 0.8, alk-phos 72, AST 22, ALT  21, protein 6.5, iron 3.0, calcium 9.3, corrected calcium 10.3, PT 14.4, INR  1.1, PTT 30, Rapid Strep test negative, fecal occult blood positive, lipase  21, ESR 20.   BRIEF HOSPITAL COURSE BY PROBLEM:  1.  Rash/laryngeal edema:  This was thought most likely to be an ALLERGIC      REACTION TO FLAGYL.  The patient's symptoms improved after treatment      with epinephrine, steroids, Ranitidine, and discontinuation of the      Flagyl.  By the time of discharge, his neck and tongue swelling had      resolved except for some mild soreness of his tongue, however, never had      any airway compromise and was tolerating a diet fully.  He was sent home      on a prednisone taper along with Benadryl and Zantac as needed.  He was      given instructions not to take Flagyl or Metronidazole in the future.  2.  Chronic diarrhea:  This has been an ongoing problem for the patient, and      he has been evaluated by his primary care providers in [place], [place]      [place].  While in the hospital, multiple stool studies were done      including acid fast culture, Giardia, cryptosporidium screen, ova and      parasites, Clostridium difficile toxin, Endomysial antibody screen,      fecal fat qualitative, fecal white blood cells, a full complement in C1  esterase inhibitor.  All these studies were negative, although he was      fecal occult blood positive.  His last colonoscopy was five years ago,      and arrangements to follow up with Dr. Arlyce Dice with [facility] as an      outpatient.  His appointment will be on February 04, 2005, at 2 p.m.  He      also has an appointment to follow up with his new primary care Jakara Blatter      at [facility] on February 06, 2005, at 11 a.m.  The patient's      hemoglobin remained stable throughout his admission, and his diarrhea      actually improved some while in the hospital.   DISCHARGE LABORATORY DATA AND VITAL SIGNS:  Temperature 97.2, pulse 56,  respirations 20, blood pressure 105/64, oxygen saturation is 93% on room  air.  White count 15.4, hemoglobin 12, platelets 308, sodium 132, potassium  4.5, chloride 116, bicarb 24, BUN 20, creatinine 0.9, and glucose 141.      Clent Demark, M.D.      Alvester Morin, M.D.  Electronically Signed    LG/MEDQ  D:  01/28/2005  T:  01/29/2005  Job:  [id number]   cc:   [facility]  FAX [phone]   Barbette Hair. Arlyce Dice, M.D. [facility]  [address]  [place]  [place] [address]   Dr. Lorelei Pont ???? [place] [facility]

## 2010-08-10 ENCOUNTER — Ambulatory Visit: Payer: Self-pay | Admitting: Urology

## 2010-08-18 ENCOUNTER — Ambulatory Visit: Payer: Self-pay | Admitting: Urology

## 2010-08-22 LAB — PATHOLOGY REPORT

## 2011-03-13 ENCOUNTER — Telehealth: Payer: Self-pay | Admitting: Gastroenterology

## 2011-03-13 NOTE — Telephone Encounter (Signed)
Left message for pt to call back  °

## 2011-03-13 NOTE — Telephone Encounter (Signed)
Pt states he is having trouble swallowing. Pt scheduled to see Dr. Arlyce Dice 03/15/11@3 :30pm. Pt aware of appt date and time.

## 2011-03-15 ENCOUNTER — Encounter: Payer: Self-pay | Admitting: Gastroenterology

## 2011-03-15 ENCOUNTER — Ambulatory Visit (INDEPENDENT_AMBULATORY_CARE_PROVIDER_SITE_OTHER): Payer: MEDICARE | Admitting: Gastroenterology

## 2011-03-15 DIAGNOSIS — K222 Esophageal obstruction: Secondary | ICD-10-CM

## 2011-03-15 DIAGNOSIS — K5150 Left sided colitis without complications: Secondary | ICD-10-CM

## 2011-03-15 NOTE — Assessment & Plan Note (Signed)
Patient is asymptomatic off all medications.

## 2011-03-15 NOTE — Assessment & Plan Note (Signed)
The patient is symptomatic again.  Plan repeat balloon dilatation

## 2011-03-15 NOTE — Progress Notes (Signed)
History of Present Illness:  Matthew Luna has returned complaining of dysphagia. He has a history of an esophageal stricture and was last dilated in December, 2010. He has mild chest discomfort accompanying his dysphagia.  Patient has a history of left-sided colitis. Symptoms have been quiescent.    Review of Systems: Pertinent positive and negative review of systems were noted in the above HPI section. All other review of systems were otherwise negative.    Current Medications, Allergies, Past Medical History, Past Surgical History, Family History and Social History were reviewed in [employer] electronic medical record  Vital signs were reviewed in today's medical record. Physical Exam: General: Well developed , well nourished, no acute distress Head: Normocephalic and atraumatic Eyes:  sclerae anicteric, EOMI Ears: Normal auditory acuity Mouth: No deformity or lesions Lungs: Clear throughout to auscultation Heart: Regular rate and rhythm; no murmurs, rubs or bruits Abdomen: Soft, non tender and non distended. No masses, hepatosplenomegaly or hernias noted. Normal Bowel sounds Rectal:deferred Musculoskeletal: Symmetrical with no gross deformities  Pulses:  Normal pulses noted Extremities: No clubbing, cyanosis, edema or deformities noted Neurological: Alert oriented x 4, grossly nonfocal Psychological:  Alert and cooperative. Normal mood and affect

## 2011-03-15 NOTE — Patient Instructions (Signed)
Upper GI Endoscopy Upper GI endoscopy means using a flexible scope to look at the esophagus, stomach, and upper small bowel. This is done to make a diagnosis in people with heartburn, abdominal pain, or abnormal bleeding. Sometimes an endoscope is needed to remove foreign bodies or food that become stuck in the esophagus; it can also be used to take biopsy samples. For the best results, do not eat or drink for 8 hours before having your upper endoscopy.  To perform the endoscopy, you will probably be sedated and your throat will be numbed with a special spray. The endoscope is then slowly passed down your throat (this will not interfere with your breathing). An endoscopy exam takes 15 to 30 minutes to complete and there is no real pain. Patients rarely remember much about the procedure. The results of the test may take several days if a biopsy or other test is taken.  You may have a sore throat after an endoscopy exam. Serious complications are very rare. Stick to liquids and soft foods until your pain is better. Do not drive a car or operate any dangerous equipment for at least 24 hours after being sedated. SEEK IMMEDIATE MEDICAL CARE IF:   You have severe throat pain.   You have shortness of breath.   You have bleeding problems.   You have a fever.   You have difficulty recovering from your sedation.  Document Released: 02/24/2004 Document Revised: 09/28/2010 Document Reviewed: 01/19/2008 [facility] Patient Information 2012 [place], [place].  You have scheduled for your Upper Endoscopy at [facility] on 03-22-2011 please follow instructions given

## 2011-03-16 ENCOUNTER — Ambulatory Visit: Payer: Self-pay | Admitting: Gastroenterology

## 2011-03-21 ENCOUNTER — Encounter (HOSPITAL_COMMUNITY): Payer: Self-pay | Admitting: *Deleted

## 2011-03-22 ENCOUNTER — Encounter (HOSPITAL_COMMUNITY): Payer: Self-pay | Admitting: *Deleted

## 2011-03-22 ENCOUNTER — Ambulatory Visit (HOSPITAL_COMMUNITY)
Admission: RE | Admit: 2011-03-22 | Discharge: 2011-03-22 | Disposition: A | Discharge status: Home Health | Payer: Medicare Other | Source: Ambulatory Visit | Attending: Gastroenterology | Admitting: Gastroenterology

## 2011-03-22 ENCOUNTER — Encounter (HOSPITAL_COMMUNITY)
Admission: RE | Disposition: A | Discharge status: Home Health | Payer: Self-pay | Source: Ambulatory Visit | Attending: Gastroenterology

## 2011-03-22 DIAGNOSIS — K222 Esophageal obstruction: Secondary | ICD-10-CM | POA: Insufficient documentation

## 2011-03-22 DIAGNOSIS — R1310 Dysphagia, unspecified: Secondary | ICD-10-CM | POA: Insufficient documentation

## 2011-03-22 HISTORY — PX: ESOPHAGOGASTRODUODENOSCOPY: SHX5428

## 2011-03-22 HISTORY — PX: BALLOON DILATION: SHX5330

## 2011-03-22 SURGERY — EGD (ESOPHAGOGASTRODUODENOSCOPY)
Anesthesia: Moderate Sedation

## 2011-03-22 MED ORDER — GLYCOPYRROLATE 0.2 MG/ML IJ SOLN
INTRAMUSCULAR | Status: DC | PRN
  Administered 2011-03-22: 0.2 mg via INTRAVENOUS

## 2011-03-22 MED ORDER — BUTAMBEN-TETRACAINE-BENZOCAINE 2-2-14 % EX AERO
INHALATION_SPRAY | CUTANEOUS | Status: DC | PRN
  Administered 2011-03-22: 1 via TOPICAL

## 2011-03-22 MED ORDER — FENTANYL CITRATE 0.05 MG/ML IJ SOLN
INTRAMUSCULAR | Status: AC
  Filled 2011-03-22: qty 2

## 2011-03-22 MED ORDER — OMEPRAZOLE 20 MG PO CPDR: 20.0000 mg | DELAYED_RELEASE_CAPSULE | Freq: Every day | ORAL | Status: DC

## 2011-03-22 MED ORDER — SODIUM CHLORIDE 0.9 % IV SOLN
Freq: Once | INTRAVENOUS | Status: AC
  Administered 2011-03-22: 500 mL via INTRAVENOUS

## 2011-03-22 MED ORDER — MIDAZOLAM HCL 10 MG/2ML IJ SOLN
INTRAMUSCULAR | Status: AC
  Filled 2011-03-22: qty 2

## 2011-03-22 MED ORDER — FENTANYL CITRATE 0.05 MG/ML IJ SOLN
INTRAMUSCULAR | Status: DC | PRN
  Administered 2011-03-22 (×2): 25 ug via INTRAVENOUS

## 2011-03-22 MED ORDER — GLYCOPYRROLATE 0.2 MG/ML IJ SOLN
INTRAMUSCULAR | Status: AC
  Filled 2011-03-22: qty 1

## 2011-03-22 MED ORDER — MIDAZOLAM HCL 10 MG/2ML IJ SOLN
INTRAMUSCULAR | Status: DC | PRN
  Administered 2011-03-22: 1 mg via INTRAVENOUS
  Administered 2011-03-22 (×2): 2 mg via INTRAVENOUS

## 2011-03-22 NOTE — Interval H&P Note (Signed)
History and Physical Interval Note:  03/22/2011 12:33 PM  [name]  has presented today for surgery, with the diagnosis of stricture  The various methods of treatment have been discussed with the patient and family. After consideration of risks, benefits and other options for treatment, the patient has consented to  Procedure(s) (LRB): ESOPHAGOGASTRODUODENOSCOPY (EGD) (N/A) BALLOON DILATION (N/A) as a surgical intervention .  The patients' history has been reviewed, patient examined, no change in status, stable for surgery.  I have reviewed the patients' chart and labs.  Questions were answered to the patient's satisfaction.    The recent H&P (dated *03/15/11 **) was reviewed, the patient was examined and there is no change in the patients condition since that H&P was completed.   Melvia Heaps  03/22/2011, 12:33 PM    Melvia Heaps

## 2011-03-22 NOTE — Op Note (Signed)
[facility] [address] [place], [place]  [address]  ENDOSCOPY PROCEDURE REPORT  PATIENT:  Matthew Luna, Matthew Luna  MR#:  [id number] BIRTHDATE:  1940/04/15, 70 yrs. old  GENDER:  male  ENDOSCOPIST:  Barbette Hair. Arlyce Dice, MD Referred by:  Lynnea Ferrier, M.D.  PROCEDURE DATE:  03/22/2011 PROCEDURE:  EGD with balloon dilatation ASA CLASS:  Class II INDICATIONS:  dysphagia  MEDICATIONS:   These medications were titrated to patient response per physician's verbal order, Fentanyl 50 mcg IV, Versed 5 mg, glycopyrrolate (Robinal) 0.2 mg IV TOPICAL ANESTHETIC:  Cetacaine Spray  DESCRIPTION OF PROCEDURE:   After the risks and benefits of the procedure were explained, informed consent was obtained.  The endoscope was introduced through the mouth and advanced to the third portion of the duodenum.  The instrument was slowly withdrawn as the mucosa was fully examined. <<PROCEDUREIMAGES>>  A stricture was found at the gastroesophageal junction. Moderate stricture. balloon dilation number is 15, 16, 17 and 18 mm dilators were inflated for 30 seconds each. There was moderate resistance to the 17 and 18 mm dilators. There was a small amount of heme (see image1).  Otherwise the examination was normal (see image2 and image3).    Retroflexed views revealed no abnormalities.    The scope was then withdrawn from the patient and the procedure completed.  COMPLICATIONS:  None  ENDOSCOPIC IMPRESSION: 1) Stricture at the gastroesophageal junction -s/p ballon dilitation 2) Otherwise normal examination RECOMMENDATIONS: 1) dilatations PRN 2  begi omeprazole 20mg  qd  ______________________________ Barbette Hair. Arlyce Dice, MD  CC:  n. eSIGNED:   Barbette Hair. Rokia Bosket at 03/22/2011 12:47 PM  Gillermo Murdoch, [id number]

## 2011-03-22 NOTE — H&P (View-Only) (Signed)
History of Present Illness:  [name] has returned complaining of dysphagia. He has a history of an esophageal stricture and was last dilated in December, 2010. He has mild chest discomfort accompanying his dysphagia.  Patient has a history of left-sided colitis. Symptoms have been quiescent.    Review of Systems: Pertinent positive and negative review of systems were noted in the above HPI section. All other review of systems were otherwise negative.    Current Medications, Allergies, Past Medical History, Past Surgical History, Family History and Social History were reviewed in [facility] electronic medical record  Vital signs were reviewed in today's medical record. Physical Exam: General: Well developed , well nourished, no acute distress Head: Normocephalic and atraumatic Eyes:  sclerae anicteric, EOMI Ears: Normal auditory acuity Mouth: No deformity or lesions Lungs: Clear throughout to auscultation Heart: Regular rate and rhythm; no murmurs, rubs or bruits Abdomen: Soft, non tender and non distended. No masses, hepatosplenomegaly or hernias noted. Normal Bowel sounds Rectal:deferred Musculoskeletal: Symmetrical with no gross deformities  Pulses:  Normal pulses noted Extremities: No clubbing, cyanosis, edema or deformities noted Neurological: Alert oriented x 4, grossly nonfocal Psychological:  Alert and cooperative. Normal mood and affect    

## 2011-03-22 NOTE — Discharge Instructions (Addendum)
Esophageal Stricture The esophagus is the long, narrow tube which carries food and liquid from the mouth to the stomach. Sometimes a part of the esophagus becomes narrow and makes it difficult, painful, or even impossible to swallow. This is called an esophageal stricture.  CAUSES  Common causes of blockage or strictures of the esophagus are:  Exposure of the lower esophagus to the acid from the stomach may cause narrowing.   Hiatal hernia in which a small part of the stomach bulges up through the diaphragm can cause a narrowing in the bottom of the esophagus.   Scleroderma is a tissue disorder that affects the esophagus and makes swallowing difficult.   Achalasia is an absence of nerves in the lower esophagus and to the esophageal sphincter. This absence of nerves may be congenital (present since birth). This can cause irregular spasms which do not allow food and fluid through.   Strictures may develop from swallowing materials which damage the esophagus. Examples are acids or alkalis such as lye.   Schatzki's Ring is a narrow ring of non-cancerous tissue which narrows the lower esophagus. The cause of this is unknown.   Growths can block the esophagus.  SYMPTOMS  Some of the problems are difficulty swallowing or pain with swallowing. DIAGNOSIS  Your caregiver often suspects this problem by taking a medical history. They will also do a physical exam. They may then take X-rays and/or perform an endoscopy. Endoscopy is an exam in which a tube like a small flexible telescope is used to look at your esophagus.  TREATMENT  One form of treatment is to dilate the narrow area. This means to stretch it.   When this is not successful, chest surgery may be required. This is a much more extensive form of treatment with a longer recovery time.  Both of the above treatments make the passage of food and water into the stomach easier. They also make it easier for stomach contents to bubble back into the  esophagus. Special medications may be used following the procedure to help prevent further narrowing. Medications may be used to lower the amount of acid in the stomach juice.  SEEK IMMEDIATE MEDICAL CARE IF:   Your swallowing is becoming more painful, difficult, or you are unable to swallow.   You vomit up blood.   You develop black tarry stools.   You develop chills.   You have a fever.   You develop chest or abdominal pain.   You develop shortness of breath, feel lightheaded, or faint.  Follow up with medical care as your caregiver suggests. Document Released: 09/26/2005 Document Revised: 09/28/2010 Document Reviewed: 11/02/2005 [facility] Patient Information 2012 [place], [place].Esophagogastroduodenoscopy This is an endoscopic procedure (a procedure that uses a device like a flexible telescope) that allows your caregiver to view the upper stomach and small bowel. This test allows your caregiver to look at the esophagus. The esophagus carries food from your mouth to your stomach. They can also look at your duodenum. This is the first part of the small intestine that attaches to the stomach. This test is used to detect problems in the bowel such as ulcers and inflammation. PREPARATION FOR TEST Nothing to eat after midnight the day before the test. NORMAL FINDINGS Normal esophagus, stomach, and duodenum. Ranges for normal findings may vary among different laboratories and hospitals. You should always check with your doctor after having lab work or other tests done to discuss the meaning of your test results and whether your values are  considered within normal limits. MEANING OF TEST  Your caregiver will go over the test results with you and discuss the importance and meaning of your results, as well as treatment options and the need for additional tests if necessary. OBTAINING THE TEST RESULTS It is your responsibility to obtain your test results. Ask the lab or department performing the  test when and how you will get your results. Document Released: 05/19/2004 Document Revised: 09/28/2010 Document Reviewed: 12/27/2007 [employer] Patient Information 2012 [employer], [employer]sophageal Stricture The esophagus is the long, narrow tube which carries food and liquid from the mouth to the stomach. Sometimes a part of the esophagus becomes narrow and makes it difficult, painful, or even impossible to swallow. This is called an esophageal stricture.  CAUSES  Common causes of blockage or strictures of the esophagus are:  Exposure of the lower esophagus to the acid from the stomach may cause narrowing.   Hiatal hernia in which a small part of the stomach bulges up through the diaphragm can cause a narrowing in the bottom of the esophagus.   Scleroderma is a tissue disorder that affects the esophagus and makes swallowing difficult.   Achalasia is an absence of nerves in the lower esophagus and to the esophageal sphincter. This absence of nerves may be congenital (present since birth). This can cause irregular spasms which do not allow food and fluid through.   Strictures may develop from swallowing materials which damage the esophagus. Examples are acids or alkalis such as lye.   Schatzki's Ring is a narrow ring of non-cancerous tissue which narrows the lower esophagus. The cause of this is unknown.   Growths can block the esophagus.  SYMPTOMS  Some of the problems are difficulty swallowing or pain with swallowing. DIAGNOSIS  Your caregiver often suspects this problem by taking a medical history. They will also do a physical exam. They may then take X-rays and/or perform an endoscopy. Endoscopy is an exam in which a tube like a small flexible telescope is used to look at your esophagus.  TREATMENT  One form of treatment is to dilate the narrow area. This means to stretch it.   When this is not successful, chest surgery may be required. This is a much more extensive form of treatment with  a longer recovery time.  Both of the above treatments make the passage of food and water into the stomach easier. They also make it easier for stomach contents to bubble back into the esophagus. Special medications may be used following the procedure to help prevent further narrowing. Medications may be used to lower the amount of acid in the stomach juice.  SEEK IMMEDIATE MEDICAL CARE IF:   Your swallowing is becoming more painful, difficult, or you are unable to swallow.   You vomit up blood.   You develop black tarry stools.   You develop chills.   You have a fever.   You develop chest or abdominal pain.   You develop shortness of breath, feel lightheaded, or faint.  Follow up with medical care as your caregiver suggests. Document Released: 09/26/2005 Document Revised: 09/28/2010 Document Reviewed: 11/02/2005 [facility] Patient Information 2012 [place], [place].Marland Kitchen

## 2011-03-23 ENCOUNTER — Encounter (HOSPITAL_COMMUNITY): Payer: Self-pay | Admitting: Gastroenterology

## 2011-11-22 ENCOUNTER — Other Ambulatory Visit: Payer: Self-pay | Admitting: Family Medicine

## 2011-11-22 ENCOUNTER — Ambulatory Visit
Admission: RE | Admit: 2011-11-22 | Discharge: 2011-11-22 | Disposition: A | Discharge status: Home / Self Care | Payer: Medicare Other | Source: Ambulatory Visit | Attending: Family Medicine | Admitting: Family Medicine

## 2011-11-22 DIAGNOSIS — R51 Headache: Secondary | ICD-10-CM

## 2011-12-05 ENCOUNTER — Other Ambulatory Visit (HOSPITAL_COMMUNITY): Payer: Self-pay | Admitting: Gastroenterology

## 2012-06-22 ENCOUNTER — Other Ambulatory Visit (HOSPITAL_COMMUNITY): Payer: Self-pay | Admitting: Gastroenterology

## 2012-08-19 DIAGNOSIS — N139 Obstructive and reflux uropathy, unspecified: Secondary | ICD-10-CM | POA: Insufficient documentation

## 2012-08-19 DIAGNOSIS — N319 Neuromuscular dysfunction of bladder, unspecified: Secondary | ICD-10-CM | POA: Insufficient documentation

## 2012-08-19 DIAGNOSIS — N200 Calculus of kidney: Secondary | ICD-10-CM | POA: Insufficient documentation

## 2012-08-19 DIAGNOSIS — C61 Malignant neoplasm of prostate: Secondary | ICD-10-CM | POA: Insufficient documentation

## 2012-08-20 ENCOUNTER — Telehealth: Payer: Self-pay | Admitting: Family Medicine

## 2012-08-20 MED ORDER — CITALOPRAM HYDROBROMIDE 40 MG PO TABS: 40.0000 mg | ORAL_TABLET | Freq: Every day | ORAL | Status: DC

## 2012-08-20 NOTE — Telephone Encounter (Signed)
Rx Refilled  

## 2012-12-06 ENCOUNTER — Other Ambulatory Visit: Payer: Self-pay | Admitting: Family Medicine

## 2012-12-06 ENCOUNTER — Ambulatory Visit (HOSPITAL_COMMUNITY)
Admission: RE | Admit: 2012-12-06 | Discharge: 2012-12-06 | Disposition: A | Discharge status: Home / Self Care | Payer: Medicare Other | Source: Ambulatory Visit | Attending: Family Medicine | Admitting: Family Medicine

## 2012-12-06 ENCOUNTER — Encounter: Payer: Self-pay | Admitting: Family Medicine

## 2012-12-06 ENCOUNTER — Ambulatory Visit (INDEPENDENT_AMBULATORY_CARE_PROVIDER_SITE_OTHER): Payer: Medicare Other | Admitting: Family Medicine

## 2012-12-06 VITALS — BP 110/84 | HR 78 | Temp 97.0°F | Resp 18 | Wt 235.0 lb

## 2012-12-06 DIAGNOSIS — R05 Cough: Secondary | ICD-10-CM

## 2012-12-06 DIAGNOSIS — R059 Cough, unspecified: Secondary | ICD-10-CM

## 2012-12-06 DIAGNOSIS — R252 Cramp and spasm: Secondary | ICD-10-CM

## 2012-12-06 MED ORDER — PANTOPRAZOLE SODIUM 40 MG PO TBEC: 40.0000 mg | DELAYED_RELEASE_TABLET | Freq: Two times a day (BID) | ORAL | Status: DC

## 2012-12-06 NOTE — Telephone Encounter (Signed)
New med sent to pharmacy

## 2012-12-06 NOTE — Progress Notes (Signed)
Subjective:    Patient ID: Matthew Luna, male    DOB: 06-Feb-1940, 72 y.o.   MRN: [id number]  HPI Patient is a very pleasant 72 year old white male with a history of hypertension. He is currently taking Azor 5/20 one by mouth daily. His blood pressures well controlled at 110/84. His main concern today is he had a cough for more than 6 months. He does have a history of severe GERD including an esophageal stricture that has been dilated several times.  He denies any fevers chills hemoptysis or shortness of breath. He does not smoke. The cough is nonproductive. He has no known exposure to whooping cough. He also takes he reports dizzy spells. This is associated with a hilly weak and tremulous. It is usually when he has a need anything. His symptoms improved immediately when he eats. It sounds like he is having hypoglycemia and may be feeling the effects of glucagon trying to correct hypoglycemia. He's not taking any oral hypoglycemic agents. He has no history of diabetes. He also complains of nocturnal muscle cramps in his legs. This occurs 2-3 times a week over the last 3 months. Past Medical History  Diagnosis Date  . Mononeuritis of unspecified site   . Actinic keratosis   . Nonspecific abnormal results of thyroid function study   . Hypertrophy of prostate without urinary obstruction and other lower urinary tract symptoms (LUTS)   . Unspecified essential hypertension   . Stricture and stenosis of esophagus   . Left sided ulcerative (chronic) colitis   . Duodenitis without mention of hemorrhage   . Esophageal reflux   . Diverticulosis of colon (without mention of hemorrhage)    Past Surgical History  Procedure Laterality Date  . Cholecystectomy    . Prostate surgery    . Esophagogastroduodenoscopy  03/22/2011    Procedure: ESOPHAGOGASTRODUODENOSCOPY (EGD);  Surgeon: Louis Meckel, MD;  Location: [facility];  Service: Endoscopy;  Laterality: N/A;  . Balloon dilation  03/22/2011   Procedure: BALLOON DILATION;  Surgeon: Louis Meckel, MD;  Location: [facility];  Service: Endoscopy;  Laterality: N/A;  kelly/ebp   Current Outpatient Prescriptions on File Prior to Visit  Medication Sig Dispense Refill  . omeprazole (PRILOSEC) 20 MG capsule TAKE ONE CAPSULE BY MOUTH ONCE A DAY  30 capsule  5   No current facility-administered medications on file prior to visit.   Allergies  Allergen Reactions  . Amoxicillin-Pot Clavulanate   . Mesalamine     REACTION: Intolerance  . Metronidazole     REACTION: anaphylixix   History   Social History  . Marital Status: Married    Spouse Name: N/A    Number of Children: N/A  . Years of Education: N/A   Occupational History  . Not on file.   Social History Main Topics  . Smoking status: Never Smoker   . Smokeless tobacco: Never Used  . Alcohol Use: 1.2 oz/week    2 Glasses of wine per week  . Drug Use: No  . Sexual Activity:    Other Topics Concern  . Not on file   Social History Narrative  . No narrative on file      Review of Systems  All other systems reviewed and are negative.       Objective:   Physical Exam  Vitals reviewed. Constitutional: He is oriented to person, place, and time.  Cardiovascular: Normal rate, regular rhythm and normal heart sounds.  Exam reveals no gallop and no friction rub.  No murmur heard. Pulmonary/Chest: Effort normal and breath sounds normal. No respiratory distress. He has no wheezes. He has no rales. He exhibits no tenderness.  Abdominal: Soft. Bowel sounds are normal. He exhibits no distension and no mass. There is no tenderness. There is no rebound and no guarding.  Neurological: He is alert and oriented to person, place, and time. He has normal reflexes. He displays normal reflexes. No cranial nerve deficit. He exhibits normal muscle tone. Coordination normal.          Assessment & Plan:  1. Cough I believe the cough is likely due to GERD. I did recommend to  get a chest x-ray today. I do not requires blood whooping cough. Chest x-ray is normal, I would discontinue omeprazole and replace it  with protonix 40 mg by mouth twice a day - DG Chest 2 View; Future  2. Cramps, extremity Check a CMP. If is no evidence of hypokalemia or hypocalcemia, recommend hydrochloroquine for nocturnal muscle cramps.  Otherwise his blood pressures well controlled. I also recommended a flu shot which the patient received today. - CBC with Differential - COMPLETE METABOLIC PANEL WITH GFR

## 2012-12-07 LAB — CBC WITH DIFFERENTIAL/PLATELET
Basophils Absolute: 0.1 10*3/uL (ref 0.0–0.1)
Basophils Relative: 1 % (ref 0–1)
Eosinophils Absolute: 0.4 10*3/uL (ref 0.0–0.7)
Eosinophils Relative: 6 % — ABNORMAL HIGH (ref 0–5)
HCT: 43 % (ref 39.0–52.0)
Hemoglobin: 14.8 g/dL (ref 13.0–17.0)
Lymphocytes Relative: 21 % (ref 12–46)
Lymphs Abs: 1.7 10*3/uL (ref 0.7–4.0)
MCH: 33.2 pg (ref 26.0–34.0)
MCHC: 34.4 g/dL (ref 30.0–36.0)
MCV: 96.4 fL (ref 78.0–100.0)
Monocytes Absolute: 0.6 10*3/uL (ref 0.1–1.0)
Monocytes Relative: 8 % (ref 3–12)
Neutro Abs: 5.1 10*3/uL (ref 1.7–7.7)
Neutrophils Relative %: 64 % (ref 43–77)
Platelets: 260 10*3/uL (ref 150–400)
RBC: 4.46 MIL/uL (ref 4.22–5.81)
RDW: 14.4 % (ref 11.5–15.5)
WBC: 8 10*3/uL (ref 4.0–10.5)

## 2012-12-07 LAB — COMPLETE METABOLIC PANEL WITH GFR
ALT: 11 U/L (ref 0–53)
AST: 14 U/L (ref 0–37)
Albumin: 3.9 g/dL (ref 3.5–5.2)
Alkaline Phosphatase: 81 U/L (ref 39–117)
BUN: 19 mg/dL (ref 6–23)
CO2: 27 mEq/L (ref 19–32)
Calcium: 9.5 mg/dL (ref 8.4–10.5)
Chloride: 102 mEq/L (ref 96–112)
Creat: 1.51 mg/dL — ABNORMAL HIGH (ref 0.50–1.35)
GFR, Est African American: 53 mL/min — ABNORMAL LOW
GFR, Est Non African American: 46 mL/min — ABNORMAL LOW
Glucose, Bld: 92 mg/dL (ref 70–99)
Potassium: 4.2 mEq/L (ref 3.5–5.3)
Sodium: 139 mEq/L (ref 135–145)
Total Bilirubin: 0.5 mg/dL (ref 0.3–1.2)
Total Protein: 6.8 g/dL (ref 6.0–8.3)

## 2012-12-22 ENCOUNTER — Encounter: Payer: Self-pay | Admitting: Family Medicine

## 2012-12-22 ENCOUNTER — Other Ambulatory Visit: Payer: Self-pay | Admitting: Family Medicine

## 2012-12-22 DIAGNOSIS — R7989 Other specified abnormal findings of blood chemistry: Secondary | ICD-10-CM

## 2012-12-25 ENCOUNTER — Telehealth: Payer: Self-pay | Admitting: Family Medicine

## 2012-12-25 NOTE — Telephone Encounter (Signed)
Pt is calling back because he received a letter to call back Call back number is [phone]

## 2012-12-25 NOTE — Telephone Encounter (Signed)
LMTRC

## 2012-12-27 NOTE — Telephone Encounter (Signed)
Pt called abck.  Informed about recent lab results and provider recommendations.  Told stop BP med for now and increase fluids.  Need to recheck in 2 weeks.  appt made.

## 2013-01-07 ENCOUNTER — Ambulatory Visit (INDEPENDENT_AMBULATORY_CARE_PROVIDER_SITE_OTHER): Payer: Medicare Other | Admitting: Family Medicine

## 2013-01-07 ENCOUNTER — Encounter: Payer: Self-pay | Admitting: Family Medicine

## 2013-01-07 VITALS — BP 120/92 | HR 82 | Temp 96.9°F | Resp 18 | Wt 235.0 lb

## 2013-01-07 DIAGNOSIS — I10 Essential (primary) hypertension: Secondary | ICD-10-CM

## 2013-01-07 DIAGNOSIS — K219 Gastro-esophageal reflux disease without esophagitis: Secondary | ICD-10-CM

## 2013-01-07 LAB — BASIC METABOLIC PANEL WITH GFR
BUN: 19 mg/dL (ref 6–23)
CO2: 27 mEq/L (ref 19–32)
Calcium: 9.7 mg/dL (ref 8.4–10.5)
Chloride: 104 mEq/L (ref 96–112)
Creat: 1.59 mg/dL — ABNORMAL HIGH (ref 0.50–1.35)
GFR, Est African American: 50 mL/min — ABNORMAL LOW
GFR, Est Non African American: 43 mL/min — ABNORMAL LOW
Glucose, Bld: 88 mg/dL (ref 70–99)
Potassium: 4.6 mEq/L (ref 3.5–5.3)
Sodium: 141 mEq/L (ref 135–145)

## 2013-01-07 MED ORDER — PANTOPRAZOLE SODIUM 40 MG PO TBEC: 40.0000 mg | DELAYED_RELEASE_TABLET | Freq: Two times a day (BID) | ORAL | Status: DC

## 2013-01-07 NOTE — Progress Notes (Signed)
Subjective:    Patient ID: Matthew Luna, male    DOB: 05-17-1940, 72 y.o.   MRN: [id number]  HPI Patient is here today for followup. At his last office visit he was found to have an elevated creatinine at 1.5. I asked the patient to temporarily discontinue his Azor. He is been on his blood pressure medicine for 2 weeks. He is here today to recheck his creatinine. His blood pressure is marginal at 120/92. The most part his blood pressure is under 140/90.  He denies any chest pain or shortness of breath. He continues to have a daily cough which he describes as a "tickle" cough in the back of his throat. Chest x-ray last time was negative.  The patient never got the prescription for the twice a day PPI. Past Medical History  Diagnosis Date  . Mononeuritis of unspecified site   . Actinic keratosis   . Nonspecific abnormal results of thyroid function study   . Hypertrophy of prostate without urinary obstruction and other lower urinary tract symptoms (LUTS)   . Unspecified essential hypertension   . Stricture and stenosis of esophagus   . Left sided ulcerative (chronic) colitis   . Duodenitis without mention of hemorrhage   . Esophageal reflux   . Diverticulosis of colon (without mention of hemorrhage)    Current Outpatient Prescriptions on File Prior to Visit  Medication Sig Dispense Refill  . citalopram (CELEXA) 20 MG tablet Take 20 mg by mouth daily.      Marland Kitchen amLODipine-olmesartan (AZOR) 5-20 MG per tablet Take 1 tablet by mouth daily.       No current facility-administered medications on file prior to visit.   Allergies  Allergen Reactions  . Amoxicillin-Pot Clavulanate   . Mesalamine     REACTION: Intolerance  . Metronidazole     REACTION: anaphylixix   History   Social History  . Marital Status: Married    Spouse Name: N/A    Number of Children: N/A  . Years of Education: N/A   Occupational History  . Not on file.   Social History Main Topics  . Smoking status: Never  Smoker   . Smokeless tobacco: Never Used  . Alcohol Use: 1.2 oz/week    2 Glasses of wine per week  . Drug Use: No  . Sexual Activity:    Other Topics Concern  . Not on file   Social History Narrative  . No narrative on file      Review of Systems  All other systems reviewed and are negative.       Objective:   Physical Exam  Vitals reviewed. Neck: Neck supple. No JVD present.  Cardiovascular: Normal rate, regular rhythm and normal heart sounds.   No murmur heard. Pulmonary/Chest: Effort normal and breath sounds normal. No respiratory distress. He has no wheezes. He has no rales. He exhibits no tenderness.  Abdominal: Soft. Bowel sounds are normal.  Musculoskeletal: He exhibits no edema.          Assessment & Plan:  1. GERD (gastroesophageal reflux disease) I believe his cough may be due to laryngo-esophageal reflux.  Discontinue omeprazole and replace with protonic 40 mg by mouth twice a day. Recheck in one month. - pantoprazole (PROTONIX) 40 MG tablet; Take 1 tablet (40 mg total) by mouth 2 (two) times daily.  Dispense: 60 tablet; Refill: 11  2. HTN (hypertension) Recheck BMP. If creatinine is improved no further workup is necessary. If creatinine continues to rise I  would recommend a renal ultrasound, SPEP, UPEP. For the time being we will monitor his blood pressure. If his blood pressures consistently greater than 140/90 I will add a beta blocker. - BASIC METABOLIC PANEL WITH GFR

## 2013-01-09 ENCOUNTER — Other Ambulatory Visit: Payer: Self-pay | Admitting: Family Medicine

## 2013-01-09 DIAGNOSIS — R7989 Other specified abnormal findings of blood chemistry: Secondary | ICD-10-CM

## 2013-01-10 ENCOUNTER — Encounter: Payer: Self-pay | Admitting: Family Medicine

## 2013-01-10 ENCOUNTER — Telehealth: Payer: Self-pay | Admitting: *Deleted

## 2013-01-10 ENCOUNTER — Ambulatory Visit
Admission: RE | Admit: 2013-01-10 | Discharge: 2013-01-10 | Disposition: A | Discharge status: Home / Self Care | Payer: Medicare Other | Source: Ambulatory Visit | Attending: Family Medicine | Admitting: Family Medicine

## 2013-01-10 DIAGNOSIS — R7989 Other specified abnormal findings of blood chemistry: Secondary | ICD-10-CM

## 2013-01-10 DIAGNOSIS — C61 Malignant neoplasm of prostate: Secondary | ICD-10-CM | POA: Insufficient documentation

## 2013-01-10 NOTE — Telephone Encounter (Signed)
LMTRC

## 2013-01-10 NOTE — Telephone Encounter (Signed)
?   OK to Refill  

## 2013-01-10 NOTE — Telephone Encounter (Signed)
Ok to refill, what were his issues?

## 2013-01-13 ENCOUNTER — Other Ambulatory Visit: Payer: Self-pay | Admitting: Family Medicine

## 2013-01-13 DIAGNOSIS — I714 Abdominal aortic aneurysm, without rupture: Secondary | ICD-10-CM

## 2013-01-14 ENCOUNTER — Encounter: Payer: Self-pay | Admitting: Family Medicine

## 2013-01-14 NOTE — Telephone Encounter (Signed)
Pt had a missed call and he believes it could have been from you Call back number is [phone]

## 2013-01-15 ENCOUNTER — Other Ambulatory Visit: Payer: Self-pay | Admitting: Family Medicine

## 2013-01-15 DIAGNOSIS — I714 Abdominal aortic aneurysm, without rupture: Secondary | ICD-10-CM

## 2013-01-16 NOTE — Telephone Encounter (Signed)
This encounter was created in error - please disregard.

## 2013-01-16 NOTE — Telephone Encounter (Signed)
Protonix is causing a dull HA. Told pt to try cutting it in half and if that does not help to call office back.

## 2013-01-21 ENCOUNTER — Telehealth: Payer: Self-pay | Admitting: Family Medicine

## 2013-01-21 NOTE — Telephone Encounter (Signed)
Pt came to have BP check after being seen in [place] ER for elevated BP 186/132 and he was placed on Clonidine .1mg  bid. In office today his BP was 120/86. He was also concerned that Protonix was given him a headache so he stopped it and i gave him samples of Nexium 20mg  qd to try and appt. Was made for 2 weeks f/u. WTP aware of above and spoke to pt and told him to continue meds as recommended.

## 2013-01-29 ENCOUNTER — Telehealth: Payer: Self-pay | Admitting: Family Medicine

## 2013-01-29 NOTE — Telephone Encounter (Signed)
Pt states that his BP has been running high and he still has that dull HA he had the other day when he was in here. His BP's were average 165/115. I told him to come by here and let me check it against his machine just to see if it was his BP that high or machine error. Once pt got here i noticed that he was putting the cuff on backwards which was probably the cause of the high readings. In office his BP was 140/87 left arm on his machine and 130/78 on right arm manual. Pt has an appt. Friday to you see for a follow up. Just FYI!

## 2013-01-31 ENCOUNTER — Ambulatory Visit (INDEPENDENT_AMBULATORY_CARE_PROVIDER_SITE_OTHER): Payer: Medicare Other | Admitting: Family Medicine

## 2013-01-31 ENCOUNTER — Encounter: Payer: Self-pay | Admitting: Family Medicine

## 2013-01-31 VITALS — BP 130/88 | HR 68 | Temp 97.7°F | Resp 18 | Wt 234.0 lb

## 2013-01-31 DIAGNOSIS — R51 Headache: Secondary | ICD-10-CM

## 2013-01-31 DIAGNOSIS — I10 Essential (primary) hypertension: Secondary | ICD-10-CM

## 2013-01-31 DIAGNOSIS — R519 Headache, unspecified: Secondary | ICD-10-CM

## 2013-01-31 MED ORDER — AMLODIPINE BESYLATE 10 MG PO TABS: 10.0000 mg | ORAL_TABLET | Freq: Every day | ORAL | Status: DC

## 2013-01-31 NOTE — Progress Notes (Signed)
Subjective:    Patient ID: Matthew Luna, male    DOB: 06-29-1940, 73 y.o.   MRN: [id number]  HPI Patient's blood pressure seems to be fluctuating wildly. Blood pressure ranges 1:30 to 160/80-110. Is usually high first thing in the morning and then decreases throughout the day. He continues to have chronic daily bitemporal headaches. This is been going on for several years. In October 2013 the patient had a CT scan of the brain that was essentially normal. He also reports chronic daily fatigue and hypersomnolence. He is been told he has sleep apnea due to his snoring and witnessed apneic episodes.  This may also be contributing to his headaches. His headaches are not migraine in nature. He denies any photophobia or phonophobia. They're nonpulsatile. There are chronic and mild 1-2 on a scale of 10. Past Medical History  Diagnosis Date  . Mononeuritis of unspecified site   . Actinic keratosis   . Nonspecific abnormal results of thyroid function study   . Hypertrophy of prostate without urinary obstruction and other lower urinary tract symptoms (LUTS)   . Unspecified essential hypertension   . Stricture and stenosis of esophagus   . Left sided ulcerative (chronic) colitis   . Duodenitis without mention of hemorrhage   . Esophageal reflux   . Diverticulosis of colon (without mention of hemorrhage)   . Prostate cancer     cryotherapy 2011 [facility])   Current Outpatient Prescriptions on File Prior to Visit  Medication Sig Dispense Refill  . citalopram (CELEXA) 20 MG tablet Take 20 mg by mouth daily.      . cloNIDine (CATAPRES) 0.1 MG tablet Take 0.1 mg by mouth 2 (two) times daily.       No current facility-administered medications on file prior to visit.   Allergies  Allergen Reactions  . Amoxicillin-Pot Clavulanate   . Mesalamine     REACTION: Intolerance  . Metronidazole     REACTION: anaphylixix   History   Social History  . Marital Status: Married    Spouse Name: N/A    Number  of Children: N/A  . Years of Education: N/A   Occupational History  . Not on file.   Social History Main Topics  . Smoking status: Never Smoker   . Smokeless tobacco: Never Used  . Alcohol Use: 1.2 oz/week    2 Glasses of wine per week  . Drug Use: No  . Sexual Activity:    Other Topics Concern  . Not on file   Social History Narrative  . No narrative on file      Review of Systems  All other systems reviewed and are negative.       Objective:   Physical Exam  Vitals reviewed. Constitutional: He is oriented to person, place, and time.  Cardiovascular: Normal rate, regular rhythm and normal heart sounds.   No murmur heard. Pulmonary/Chest: Effort normal and breath sounds normal. No respiratory distress. He has no wheezes. He has no rales.  Abdominal: Soft. Bowel sounds are normal. He exhibits no distension. There is no tenderness. There is no rebound.  Neurological: He is alert and oriented to person, place, and time. He displays normal reflexes. No cranial nerve deficit. He exhibits normal muscle tone. Coordination normal.          Assessment & Plan:  HTN (hypertension)  Headache, chronic daily  I have asked the patient discontinue clonidine as I believe this can be contributing to his fatigue. I recommended he start  amlodipine 10 mg by mouth daily. Previously his blood pressure was controlled with a low dose of azor and I am hopeful that amlodipine will help control his blood pressure. I believe we need to schedule the patient for a sleep study to evaluate for sleep apnea. This may be a cause of his wildly fluctuating blood pressures and his chronic daily headache. If he does not have sleep apnea, I would consider starting the patient on Topamax for chronic daily headache syndrome.

## 2013-02-02 ENCOUNTER — Emergency Department (HOSPITAL_COMMUNITY)
Admission: EM | Admit: 2013-02-02 | Discharge: 2013-02-02 | Disposition: A | Discharge status: Home / Self Care | Payer: Medicare Other | Attending: Emergency Medicine | Admitting: Emergency Medicine

## 2013-02-02 ENCOUNTER — Encounter (HOSPITAL_COMMUNITY): Payer: Self-pay | Admitting: Emergency Medicine

## 2013-02-02 ENCOUNTER — Emergency Department (HOSPITAL_COMMUNITY): Payer: Medicare Other

## 2013-02-02 DIAGNOSIS — Z872 Personal history of diseases of the skin and subcutaneous tissue: Secondary | ICD-10-CM | POA: Insufficient documentation

## 2013-02-02 DIAGNOSIS — Z880 Allergy status to penicillin: Secondary | ICD-10-CM | POA: Insufficient documentation

## 2013-02-02 DIAGNOSIS — R519 Headache, unspecified: Secondary | ICD-10-CM

## 2013-02-02 DIAGNOSIS — Z87448 Personal history of other diseases of urinary system: Secondary | ICD-10-CM | POA: Insufficient documentation

## 2013-02-02 DIAGNOSIS — K219 Gastro-esophageal reflux disease without esophagitis: Secondary | ICD-10-CM | POA: Insufficient documentation

## 2013-02-02 DIAGNOSIS — Z79899 Other long term (current) drug therapy: Secondary | ICD-10-CM | POA: Insufficient documentation

## 2013-02-02 DIAGNOSIS — Z8669 Personal history of other diseases of the nervous system and sense organs: Secondary | ICD-10-CM | POA: Insufficient documentation

## 2013-02-02 DIAGNOSIS — Z8546 Personal history of malignant neoplasm of prostate: Secondary | ICD-10-CM | POA: Insufficient documentation

## 2013-02-02 DIAGNOSIS — I10 Essential (primary) hypertension: Secondary | ICD-10-CM | POA: Insufficient documentation

## 2013-02-02 DIAGNOSIS — R51 Headache: Secondary | ICD-10-CM | POA: Insufficient documentation

## 2013-02-02 MED ORDER — OXYCODONE-ACETAMINOPHEN 5-325 MG PO TABS
2.0000 | ORAL_TABLET | Freq: Once | ORAL | Status: AC
  Administered 2013-02-02: 2 via ORAL
  Filled 2013-02-02: qty 2

## 2013-02-02 MED ORDER — IBUPROFEN 400 MG PO TABS
600.0000 mg | ORAL_TABLET | Freq: Once | ORAL | Status: AC
  Administered 2013-02-02: 600 mg via ORAL
  Filled 2013-02-02: qty 2

## 2013-02-02 MED ORDER — OXYCODONE-ACETAMINOPHEN 5-325 MG PO TABS: 1.0000 | ORAL_TABLET | Freq: Once | ORAL | Status: DC

## 2013-02-02 MED ORDER — CLONIDINE HCL 0.2 MG PO TABS
0.2000 mg | ORAL_TABLET | Freq: Once | ORAL | Status: AC
  Administered 2013-02-02: 0.2 mg via ORAL
  Filled 2013-02-02: qty 1

## 2013-02-02 NOTE — ED Notes (Signed)
Pt reporting recent change in BP medications.  States that he forgot to take medication yesterday, but took 2 doses today.  Reporting headache and "pounding feeling".

## 2013-02-02 NOTE — ED Notes (Signed)
States his headache is gone but now is having "chest pain" as he is rubbing his abd. Had pt lay down, cardiac monitor placed and EKG done

## 2013-02-02 NOTE — ED Notes (Signed)
States he took his usual dose of clonidine 01/31/12 then was told by his PMD to stop that and start Norvasc 10mg . Rx wasn't ready from the pharmacy until 02/02/12  afternoon. Pt took 10mg  3pm,  5mg  9pm and because of increasing h/a took additional 5mg  40mn. Now H/A is subsiding with rest.

## 2013-02-02 NOTE — ED Provider Notes (Signed)
CSN: [id number]     Arrival date & time 02/02/13  0100 History   First MD Initiated Contact with Patient 02/02/13 0113     Chief Complaint  Patient presents with  . Hypertension   HPI Patient reports worsening developing a headache today.  He states that his blood pressure medications were recently changed and he did not take a blood pressure pill today until this afternoon.  He took a dose of his Norvasc which is a new medication form.  He states his headache started shortly after that.  He was not acute in onset but did seem to worsen to the evening.  He does not typically get headaches.  History of cancer.  He states no weakness of his arms or legs.  He denies chest pain shortness of breath.   Past Medical History  Diagnosis Date  . Mononeuritis of unspecified site   . Actinic keratosis   . Nonspecific abnormal results of thyroid function study   . Hypertrophy of prostate without urinary obstruction and other lower urinary tract symptoms (LUTS)   . Unspecified essential hypertension   . Stricture and stenosis of esophagus   . Left sided ulcerative (chronic) colitis   . Duodenitis without mention of hemorrhage   . Esophageal reflux   . Diverticulosis of colon (without mention of hemorrhage)   . Prostate cancer     cryotherapy 2011 [facility])   Past Surgical History  Procedure Laterality Date  . Cholecystectomy    . Prostate surgery    . Esophagogastroduodenoscopy  03/22/2011    Procedure: ESOPHAGOGASTRODUODENOSCOPY (EGD);  Surgeon: Inda Castle, MD;  Location: [facility];  Service: Endoscopy;  Laterality: N/A;  . Balloon dilation  03/22/2011    Procedure: BALLOON DILATION;  Surgeon: Inda Castle, MD;  Location: [facility];  Service: Endoscopy;  Laterality: N/A;  kelly/ebp   Family History  Problem Relation Age of Onset  . Cancer    . Anesthesia problems Neg Hx   . Hypotension Neg Hx   . Malignant hyperthermia Neg Hx   . Pseudochol deficiency Neg Hx    History  Substance  Use Topics  . Smoking status: Never Smoker   . Smokeless tobacco: Never Used  . Alcohol Use: 1.2 oz/week    2 Glasses of wine per week    Review of Systems  All other systems reviewed and are negative.    Allergies  Amoxicillin-pot clavulanate; Mesalamine; and Metronidazole  Home Medications   Current Outpatient Rx  Name  Route  Sig  Dispense  Refill  . omeprazole (PRILOSEC) 20 MG capsule   Oral   Take 20 mg by mouth daily.         Marland Kitchen amLODipine (NORVASC) 10 MG tablet   Oral   Take 1 tablet (10 mg total) by mouth daily.   90 tablet   3   . citalopram (CELEXA) 20 MG tablet   Oral   Take 20 mg by mouth daily.         . cloNIDine (CATAPRES) 0.1 MG tablet   Oral   Take 0.1 mg by mouth 2 (two) times daily.          BP 153/84  Pulse 52  Temp(Src) 98.4 F (36.9 C) (Oral)  Resp 16  Ht 6\' 2"  (1.88 m)  Wt 232 lb (105.235 kg)  BMI 29.77 kg/m2  SpO2 98% Physical Exam  Nursing note and vitals reviewed. Constitutional: He is oriented to person, place, and time. He appears well-developed  and well-nourished.  HENT:  Head: Normocephalic and atraumatic.  Eyes: EOM are normal. Pupils are equal, round, and reactive to light.  Neck: Normal range of motion.  Cardiovascular: Normal rate, regular rhythm, normal heart sounds and intact distal pulses.   Pulmonary/Chest: Effort normal and breath sounds normal. No respiratory distress.  Abdominal: Soft. He exhibits no distension. There is no tenderness.  Musculoskeletal: Normal range of motion.  Neurological: He is alert and oriented to person, place, and time.  5/5 strength in major muscle groups of  bilateral upper and lower extremities. Speech normal. No facial asymetry.   Skin: Skin is warm and dry.  Psychiatric: He has a normal mood and affect. Judgment normal.    ED Course  Procedures (including critical care time) Labs Review Labs Reviewed - No data to display Imaging Review Ct Head Wo Contrast  02/02/2013    CLINICAL DATA:  Headache  EXAM: CT HEAD WITHOUT CONTRAST  TECHNIQUE: Contiguous axial images were obtained from the base of the skull through the vertex without intravenous contrast.  COMPARISON:  Knee prior CT from 11/22/2011  FINDINGS: There is no acute intracranial hemorrhage or infarct. No mass lesion or midline shift. Gray-white matter differentiation is well maintained. Ventricles are normal in size without evidence of hydrocephalus. CSF containing spaces are within normal limits. No extra-axial fluid collection. Scattered atherosclerotic calcifications noted within the vertebral arteries at the skullbase. Carotid siphon on vascular calcifications noted bilaterally.  The calvarium is intact.  Orbital soft tissues are within normal limits.  The paranasal sinuses and mastoid air cells are well pneumatized and free of fluid.  Scalp soft tissues are unremarkable.  IMPRESSION: No acute intracranial process identified.   Electronically Signed   By: Jeannine Boga M.D.   On: 02/02/2013 03:44  I personally reviewed the imaging tests through PACS system I reviewed available ER/hospitalization records through the EMR   EKG Interpretation    Date/Time:  Sunday February 02 2013 03:49:01 EST Ventricular Rate:  54 PR Interval:  194 QRS Duration: 104 QT Interval:  476 QTC Calculation: 451 R Axis:   -24 Text Interpretation:  Sinus bradycardia Otherwise normal ECG When compared with ECG of 24-Jan-2005 09:51, No significant change was found Confirmed by Oziel Beitler  MD, Emilianna Barlowe ([id number]) on 02/02/2013 4:15:27 AM            MDM   1. Headache   2. Hypertension    4:31 AM Patient feels much better at this time.  Blood pressure improving.  EKG without acute changes.  Head CT normal.  Discharge home in good condition    Hoy Morn, MD 02/02/13 [phone]

## 2013-02-06 ENCOUNTER — Encounter: Payer: Self-pay | Admitting: Vascular Surgery

## 2013-02-06 ENCOUNTER — Telehealth: Payer: Self-pay | Admitting: Family Medicine

## 2013-02-06 MED ORDER — CLONIDINE HCL 0.1 MG PO TABS: 0.1000 mg | ORAL_TABLET | Freq: Two times a day (BID) | ORAL | Status: DC

## 2013-02-06 NOTE — Telephone Encounter (Signed)
Added clonidine 0.1mg  pobid to amlodipine 10 poqday and I will recheck him in 1 week.

## 2013-02-06 NOTE — Telephone Encounter (Signed)
Pt's BP has been running high again and he is still having a dull headache.   BP in office 150/100 Left arm / 170/100 Right arm.

## 2013-02-07 ENCOUNTER — Encounter: Payer: Self-pay | Admitting: Vascular Surgery

## 2013-02-07 ENCOUNTER — Ambulatory Visit (INDEPENDENT_AMBULATORY_CARE_PROVIDER_SITE_OTHER): Payer: Medicare Other | Admitting: Vascular Surgery

## 2013-02-07 ENCOUNTER — Ambulatory Visit (INDEPENDENT_AMBULATORY_CARE_PROVIDER_SITE_OTHER): Payer: Medicare Other | Admitting: Family Medicine

## 2013-02-07 ENCOUNTER — Encounter: Payer: Self-pay | Admitting: Family Medicine

## 2013-02-07 VITALS — BP 120/78 | HR 78 | Temp 96.8°F | Resp 18 | Ht 75.0 in | Wt 237.0 lb

## 2013-02-07 VITALS — BP 129/88 | HR 80 | Resp 18 | Ht 74.0 in | Wt 235.0 lb

## 2013-02-07 DIAGNOSIS — I714 Abdominal aortic aneurysm, without rupture, unspecified: Secondary | ICD-10-CM | POA: Insufficient documentation

## 2013-02-07 DIAGNOSIS — I10 Essential (primary) hypertension: Secondary | ICD-10-CM

## 2013-02-07 NOTE — Addendum Note (Signed)
Addended by: Mena Goes on: 02/07/2013 02:10 PM   Modules accepted: Orders

## 2013-02-07 NOTE — Progress Notes (Signed)
Subjective:    Patient ID: Matthew Luna, male    DOB: 05-17-1940, 73 y.o.   MRN: [id number]  HPI Since stopping the patient's azor due to a rise in his creatinine, his blood pressure has been highly labile. He was started on clonidine 0.1 mg by mouth twice a day as well as amlodipine 10 mg by mouth daily. His blood pressure is much better today. On my check is 140/90 in both arms.  However since starting amlodipine he has noticed swelling in both feet. Past Medical History  Diagnosis Date  . Mononeuritis of unspecified site   . Actinic keratosis   . Nonspecific abnormal results of thyroid function study   . Hypertrophy of prostate without urinary obstruction and other lower urinary tract symptoms (LUTS)   . Unspecified essential hypertension   . Stricture and stenosis of esophagus   . Left sided ulcerative (chronic) colitis   . Duodenitis without mention of hemorrhage   . Esophageal reflux   . Diverticulosis of colon (without mention of hemorrhage)   . Prostate cancer     cryotherapy 2011 [facility])   Current Outpatient Prescriptions on File Prior to Visit  Medication Sig Dispense Refill  . amLODipine (NORVASC) 10 MG tablet Take 1 tablet (10 mg total) by mouth daily.  90 tablet  3  . citalopram (CELEXA) 20 MG tablet Take 20 mg by mouth daily.      . cloNIDine (CATAPRES) 0.1 MG tablet Take 1 tablet (0.1 mg total) by mouth 2 (two) times daily.  60 tablet  5  . esomeprazole (NEXIUM) 20 MG capsule Take 20 mg by mouth daily at 12 noon.       No current facility-administered medications on file prior to visit.   Allergies  Allergen Reactions  . Amoxicillin-Pot Clavulanate   . Mesalamine     REACTION: Intolerance  . Metronidazole     REACTION: anaphylixix   History   Social History  . Marital Status: Married    Spouse Name: N/A    Number of Children: N/A  . Years of Education: N/A   Occupational History  . Not on file.   Social History Main Topics  . Smoking status: Never  Smoker   . Smokeless tobacco: Never Used  . Alcohol Use: 1.2 oz/week    2 Glasses of wine per week  . Drug Use: No  . Sexual Activity: Not on file   Other Topics Concern  . Not on file   Social History Narrative  . No narrative on file      Review of Systems  All other systems reviewed and are negative.       Objective:   Physical Exam  Vitals reviewed. Neck: Neck supple. No JVD present.  Cardiovascular: Normal rate, regular rhythm and normal heart sounds.  Exam reveals no gallop and no friction rub.   No murmur heard. Pulmonary/Chest: Effort normal and breath sounds normal. No respiratory distress. He has no wheezes. He has no rales. He exhibits no tenderness.  Abdominal: Soft. Bowel sounds are normal. He exhibits no distension. There is no tenderness. There is no rebound and no guarding.  Musculoskeletal: He exhibits edema.  Lymphadenopathy:    He has no cervical adenopathy.   patient has +1 bipedal edema that stops just above the ankle        Assessment & Plan:  1. HTN (hypertension) There is no evidence of CHF. The patient denies any chest pain or shortness of breath or orthopnea.  His lungs are clear on examination. I believe the edema is due to the amlodipine. Recheck the patient's blood pressure in one week along with his renal function. Today I simply reassured the patient that amlodipine is causing the swelling.

## 2013-02-07 NOTE — Progress Notes (Signed)
Subjective:     Patient ID: Matthew Luna, male   DOB: [birth year], 73 y.o.   MRN: [id number]  HPI this 73 year old male was referred by Dr. Dennard Schaumann for evaluation of a recently diagnosed infrarenal abdominal aortic aneurysm. Patient had ultrasound study ordered because of slight elevation of the creatinine to 1.59. A 4.5 cm infrarenal aortic aneurysm was noted and he was referred for evaluation. He did have a CT scan performed in 2006 which I have reviewed and this revealed a 2.6 cm mildly dilated infrarenal aorta. He has no history of abdominal or severe back or flank discomfort. He denies claudication. He has no GI symptoms such as nausea vomiting or change in bowel habits. He has noted some lower extremity edema bilaterally in the last 24 hours after starting on the new medication yesterday. He has no history of DVT.  Past Medical History  Diagnosis Date  . Mononeuritis of unspecified site   . Actinic keratosis   . Nonspecific abnormal results of thyroid function study   . Hypertrophy of prostate without urinary obstruction and other lower urinary tract symptoms (LUTS)   . Unspecified essential hypertension   . Stricture and stenosis of esophagus   . Left sided ulcerative (chronic) colitis   . Duodenitis without mention of hemorrhage   . Esophageal reflux   . Diverticulosis of colon (without mention of hemorrhage)   . Prostate cancer     cryotherapy 2011 [facility])    History  Substance Use Topics  . Smoking status: Never Smoker   . Smokeless tobacco: Never Used  . Alcohol Use: 1.2 oz/week    2 Glasses of wine per week    Family History  Problem Relation Age of Onset  . Cancer    . Anesthesia problems Neg Hx   . Hypotension Neg Hx   . Malignant hyperthermia Neg Hx   . Pseudochol deficiency Neg Hx   . Cancer Mother     Allergies  Allergen Reactions  . Amoxicillin-Pot Clavulanate   . Mesalamine     REACTION: Intolerance  . Metronidazole     REACTION: anaphylixix    Current  outpatient prescriptions:amLODipine (NORVASC) 10 MG tablet, Take 1 tablet (10 mg total) by mouth daily., Disp: 90 tablet, Rfl: 3;  citalopram (CELEXA) 20 MG tablet, Take 20 mg by mouth daily., Disp: , Rfl: ;  cloNIDine (CATAPRES) 0.1 MG tablet, Take 1 tablet (0.1 mg total) by mouth 2 (two) times daily., Disp: 60 tablet, Rfl: 5;  esomeprazole (NEXIUM) 20 MG capsule, Take 20 mg by mouth daily at 12 noon., Disp: , Rfl:  omeprazole (PRILOSEC) 20 MG capsule, Take 20 mg by mouth daily., Disp: , Rfl:   BP 129/88  Pulse 80  Resp 18  Ht 6\' 2"  (1.88 m)  Wt 235 lb (106.595 kg)  BMI 30.16 kg/m2  Body mass index is 30.16 kg/(m^2).           Review of Systems denies chest pain, dyspnea on exertion, PND, orthopnea, hemoptysis, lateralizing weakness, aphasia, patient does state he had one episode of transient visual loss in the right eye several years ago with a negative workup but this has not recurred. All other systems negative and complete review of systems     Objective:   Physical Exam BP 129/88  Pulse 80  Resp 18  Ht 6\' 2"  (1.88 m)  Wt 235 lb (106.595 kg)  BMI 30.16 kg/m2  Gen.-alert and oriented x3 in no apparent distress HEENT normal for age Lungs  no rhonchi or wheezing Cardiovascular regular rhythm no murmurs carotid pulses 3+ palpable no bruits audible Abdomen soft nontender no palpable masses Musculoskeletal free of  major deformities Skin clear -no rashes Neurologic normal Lower extremities 3+ femoral and dorsalis pedis pulses palpable bilaterally with 1+ edema bilaterally from midcalf distally  I reviewed the ultrasound report which was performed 01/10/2013 and also reviewed by computer the CT scan of the abdomen and pelvis performed in 2006 revealing the infrarenal aorta to be 26 mm in 2006 and 4.5 cm in 2014       Assessment:     4.5 cm infrarenal abdominal aortic aneurysm-asymptomatic Creatinine 1.59     Plan:     Return in 9 months for CT angiogram of the  abdomen and pelvis for further evaluation of this small aortic aneurysm and will then continue annual followup  Risk of rupture is quite small but discussed with patient and his wife that if he develops severe abdominal flank or back pain to report to emergency department immediately for CT scan

## 2013-02-13 ENCOUNTER — Other Ambulatory Visit: Payer: Medicare Other

## 2013-02-13 ENCOUNTER — Encounter: Payer: Medicare Other | Admitting: Vascular Surgery

## 2013-02-13 DIAGNOSIS — R7989 Other specified abnormal findings of blood chemistry: Secondary | ICD-10-CM

## 2013-02-13 LAB — BASIC METABOLIC PANEL
BUN: 26 mg/dL — ABNORMAL HIGH (ref 6–23)
CO2: 28 mEq/L (ref 19–32)
Calcium: 9.5 mg/dL (ref 8.4–10.5)
Chloride: 102 mEq/L (ref 96–112)
Creat: 1.33 mg/dL (ref 0.50–1.35)
Glucose, Bld: 89 mg/dL (ref 70–99)
Potassium: 4.7 mEq/L (ref 3.5–5.3)
Sodium: 142 mEq/L (ref 135–145)

## 2013-02-19 ENCOUNTER — Encounter: Payer: Medicare Other | Admitting: Vascular Surgery

## 2013-02-19 ENCOUNTER — Ambulatory Visit (INDEPENDENT_AMBULATORY_CARE_PROVIDER_SITE_OTHER): Payer: Medicare Other | Admitting: Neurology

## 2013-02-19 ENCOUNTER — Other Ambulatory Visit (HOSPITAL_COMMUNITY): Payer: Medicare Other

## 2013-02-19 ENCOUNTER — Encounter: Payer: Self-pay | Admitting: Neurology

## 2013-02-19 VITALS — BP 170/100 | HR 68 | Temp 96.3°F | Ht 74.0 in | Wt 237.0 lb

## 2013-02-19 DIAGNOSIS — R51 Headache: Secondary | ICD-10-CM

## 2013-02-19 DIAGNOSIS — G4733 Obstructive sleep apnea (adult) (pediatric): Secondary | ICD-10-CM

## 2013-02-19 DIAGNOSIS — I10 Essential (primary) hypertension: Secondary | ICD-10-CM

## 2013-02-19 DIAGNOSIS — H6120 Impacted cerumen, unspecified ear: Secondary | ICD-10-CM

## 2013-02-19 DIAGNOSIS — R519 Headache, unspecified: Secondary | ICD-10-CM

## 2013-02-19 DIAGNOSIS — R609 Edema, unspecified: Secondary | ICD-10-CM

## 2013-02-19 DIAGNOSIS — R600 Localized edema: Secondary | ICD-10-CM

## 2013-02-19 NOTE — Progress Notes (Signed)
Subjective:    Patient ID: Matthew Luna is a 73 y.o. male.  HPI  Star Age, MD, PhD [facility] [address], [address] [place], [place] [address]  Dear Dr. Dennard Schaumann,   I saw your patient, Matthew Luna, upon your kind request in my neurologic clinic today for initial consultation of his sleep disorder, in particular, concern for obstructive sleep apnea. The patient is unaccompanied today. As you know, Matthew Luna is a very friendly 73 year old right-handed gentleman with an underlying medical history of hypertension, esophageal stricture, ulcerative colitis, BPH, prostate cancer, AAA, and neuropathy (previously saw Dr. Brett Fairy), who reports daytime somnolence for about a month.  He has been noted to have fluctuation in his blood pressure values as well as recurrent bitemporal headaches. He has had headaches for several years, worse in the past few weeks. He reports snoring and witnessed apneic episodes. He has been known to snore for many years. He had laser surgery to his soft palate some 20 years ago, which helped his snore. He wakes up with a gasp and with his snores. He has had minor weight fluctuations and does not exercise because of lack of energy. He goes to bed earlier in the last month or 2, at around 7:30 to 8 PM and falls asleep within 30 minutes. He wakes up 3 times in the night to use the bathroom. His wake time is 6:30 AM. He feels marginally rested in the morning. He has rare morning HA. During the day, he falls asleep inadvertently, but does not take a scheduled nap. He watches TV in bed. He drinks one glass of red wine a day, does not smoke. He has started taking cider vinegar to help his BP and reflux. He is bothered by his leg edema. This is new. He did not take his amlodipine last night, because of the swelling.  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  His ESS is 8/24 today. He denies RLS Sx and is not known  to kick in his sleep. He has occasional leg cramps at night. He tried eating more bananas and tried tonic water.   He went to the emergency room on 02/02/2013 with headaches. He had a head CT which I reviewed: No acute intracranial process identified. I also reviewed the images myself through the PACS system.  There is family history of OSA, but suspected in his son.    He denies cataplexy, sleep paralysis, hypnagogic or hypnopompic hallucinations, or sleep attacks. He does not report any vivid dreams, nightmares, dream enactments, or parasomnias, such as sleep talking or sleep walking. The patient has not had a sleep study or a home sleep test, but tried someone else's CPAP machine about a year ago and he did not like it. 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   His Past Medical History Is Significant For: Past Medical History  Diagnosis Date  . Mononeuritis of unspecified site   . Actinic keratosis   . Nonspecific abnormal results of thyroid function study   . Hypertrophy of prostate without urinary obstruction and other lower urinary tract symptoms (LUTS)   . Unspecified essential hypertension   . Stricture and stenosis of esophagus   . Left sided ulcerative (chronic) colitis   . Duodenitis without mention of hemorrhage   . Esophageal reflux   .  Diverticulosis of colon (without mention of hemorrhage)   . Prostate cancer     cryotherapy 2011 [facility])    His Past Surgical History Is Significant For: Past Surgical History  Procedure Laterality Date  . Cholecystectomy    . Prostate surgery    . Esophagogastroduodenoscopy  03/22/2011    Procedure: ESOPHAGOGASTRODUODENOSCOPY (EGD);  Surgeon: Inda Castle, MD;  Location: [facility];  Service: Endoscopy;  Laterality: N/A;  . Balloon dilation  03/22/2011    Procedure: BALLOON DILATION;  Surgeon: Inda Castle, MD;  Location: [facility]  ENDOSCOPY;  Service: Endoscopy;  Laterality: N/A;  kelly/ebp    His Family History Is Significant For: Family History  Problem Relation Age of Onset  . Cancer    . Anesthesia problems Neg Hx   . Hypotension Neg Hx   . Malignant hyperthermia Neg Hx   . Pseudochol deficiency Neg Hx   . Cancer Mother   . Snoring Father     His Social History Is Significant For: History   Social History  . Marital Status: Married    Spouse Name: N/A    Number of Children: N/A  . Years of Education: N/A   Social History Main Topics  . Smoking status: Never Smoker   . Smokeless tobacco: Never Used  . Alcohol Use: 1.2 oz/week    2 Glasses of wine per week  . Drug Use: No  . Sexual Activity: None   Other Topics Concern  . None   Social History Narrative  . None    His Allergies Are:  Allergies  Allergen Reactions  . Amoxicillin-Pot Clavulanate   . Ciprofloxacin   . Mesalamine     REACTION: Intolerance  . Metronidazole     REACTION: anaphylixix  :   His Current Medications Are:  Outpatient Encounter Prescriptions as of 02/19/2013  Medication Sig  . citalopram (CELEXA) 20 MG tablet Take 20 mg by mouth daily.  . cloNIDine (CATAPRES) 0.1 MG tablet Take 1 tablet (0.1 mg total) by mouth 2 (two) times daily.  Marland Kitchen esomeprazole (NEXIUM) 20 MG capsule Take 20 mg by mouth daily at 12 noon.  Marland Kitchen amLODipine (NORVASC) 10 MG tablet Take 1 tablet (10 mg total) by mouth daily.  :  Review of Systems:  Out of a complete 14 point review of systems, all are reviewed and negative with the exception of these symptoms as listed below:   Review of Systems  Constitutional: Positive for activity change and fatigue.  HENT: Positive for trouble swallowing.   Eyes: Positive for pain and visual disturbance (blurred vision).  Respiratory: Negative.   Cardiovascular: Positive for leg swelling.  Gastrointestinal: Negative.   Endocrine: Negative.   Genitourinary: Negative.   Musculoskeletal:       Cramps   Skin: Negative.   Allergic/Immunologic: Negative.   Neurological: Negative.   Hematological: Bruises/bleeds easily.  Psychiatric/Behavioral: Positive for sleep disturbance (too much sleep, snoring).    Objective:  Neurologic Exam  Physical Exam Physical Examination:   Filed Vitals:   02/19/13 0900  BP: 170/100  Pulse: 68  Temp: 96.3 F (35.7 C)    General Examination: The patient is a very pleasant 73 y.o. male in no acute distress. He appears well-developed and well-nourished and well groomed. He is overweight. His BP is elevated, but he denies CP/SOB/diplopia, but has a mild, 1/10, fronto-temporal HA.  HEENT: Normocephalic, atraumatic, pupils are equal, round and reactive to light and accommodation. Funduscopic exam is normal with sharp disc margins noted. Extraocular  tracking is good without limitation to gaze excursion or nystagmus noted. Normal smooth pursuit is noted. Hearing is grossly intact. Tympanic membranes are not visualized due to cerumen impaction bilaterally. Face is symmetric with normal facial animation and normal facial sensation. Speech is clear with no dysarthria noted. There is no hypophonia. There is no lip, neck/head, jaw or voice tremor. Neck is supple with full range of passive and active motion. There are no carotid bruits on auscultation. Oropharynx exam reveals: severe mouth dryness, adequate dental hygiene and marked airway crowding, due to large uvula, and thicker soft palate. Mallampati is class III. Tongue protrudes centrally and palate elevates symmetrically. Tonsils are small. Neck size is 19 3/8 inches.   Chest: Clear to auscultation without wheezing, rhonchi or crackles noted.  Heart: S1+S2+0, regular and normal without murmurs, rubs or gallops noted.   Abdomen: Soft, non-tender and non-distended with normal bowel sounds appreciated on auscultation.  Extremities: There is 2+ pitting edema in the distal lower extremities bilaterally, mainly around  both ankles. Pedal pulses are intact.  Skin: Warm and dry without trophic changes noted. There are no varicose veins.  Musculoskeletal: exam reveals no obvious joint deformities, tenderness or joint swelling or erythema.   Neurologically:  Mental status: The patient is awake, alert and oriented in all 4 spheres. His memory, attention, language and knowledge are appropriate. There is no aphasia, agnosia, apraxia or anomia. Speech is clear with normal prosody and enunciation. Thought process is linear. Mood is congruent and affect is normal.  Cranial nerves are as described above under HEENT exam. In addition, shoulder shrug is normal with equal shoulder height noted. Motor exam: Normal bulk, strength and tone is noted. There is no drift, tremor or rebound. Romberg is negative. Reflexes are 2+ in the UEs and knees and absent in both ankles. Toes are downgoing bilaterally. Fine motor skills are intact with normal finger taps, normal hand movements, normal rapid alternating patting, normal foot taps and normal foot agility.  Cerebellar testing shows no dysmetria or intention tremor on finger to nose testing. Heel to shin is unremarkable bilaterally. There is no truncal or gait ataxia.  Sensory exam is intact to light touch, pinprick, vibration, temperature sense in the upper and lower extremities, with the exception of reduced vibration sense and PP in the distal LEs.  Gait, station and balance are unremarkable. No veering to one side is noted. No leaning to one side is noted. Posture is age-appropriate and stance is narrow based. No problems turning are noted. He turns en bloc. Tandem walk is unremarkable. Intact toe and heel stance is noted.               Assessment and Plan:   In summary, Matthew Luna is a very pleasant 73 y.o.-year old male with a history of difficult to control BP, who reports, lack of energy during the day, recurrent HAs and he has a Hx and physical exam concerning for  obstructive sleep apnea (OSA). I had a long chat with the patient about my findings and the diagnosis, its prognosis and treatment options. We talked about medical treatments and non-pharmacological approaches. I explained in particular the risks and ramifications of untreated moderate to severe OSA, especially with respect to developing cardiovascular disease down the Road, including congestive heart failure, difficult to treat hypertension, cardiac arrhythmias, or stroke. Even type 2 diabetes has in part been linked to untreated OSA. We talked about trying to maintain a healthy lifestyle in general, as well  as the importance of weight control. I encouraged the patient to eat healthy, exercise daily and keep well hydrated, to keep a scheduled bedtime and wake time routine, to not skip any meals and eat healthy snacks in between meals.  I recommended the following at this time: sleep study with potential positive airway pressure titration.  He stopped taking his amlodipine last night, but was discouraged from stopping his BP medication. He is going to see you or your Associates today. He also has significant lower extremity ankle swelling which you are aware of. He has impacted cerumen bilaterally. I explained the sleep test procedure to the patient and also outlined possible surgical and non-surgical treatment options of OSA, including the use of a custom-made dental device, upper airway surgical options, such as pillar implants, radiofrequency surgery, tongue base surgery, and UPPP. I also explained the CPAP treatment option to the patient, who indicated that he would be willing to try CPAP if the need arises. He does stress the fact that he did not like using CPAP when he tried it out one time from someone else and I explained to him that trying someone else's CPAP is not always a great idea as the pressure is individually different than the mask fitting is different. He may have a better experience when he  is titrated in the lab. Treating him for sleep apnea may have a positive impact on his blood pressure and his headaches I explained. I stressed the importance of being compliant with PAP treatment, not only for insurance purposes but primarily to improve His symptoms, and for the patient's long term health benefit, including to reduce His cardiovascular risks. I answered all his questions today and the patient was in agreement. I would like to see him back after the sleep study is completed and encouraged him to call with any interim questions, concerns, problems or updates.   Thank you very much for allowing me to participate in the care of this nice patient. If I can be of any further assistance to you please do not hesitate to call me at [phone].  Sincerely,   Star Age, MD, PhD

## 2013-02-19 NOTE — Patient Instructions (Signed)
Based on your symptoms and your exam I believe you are at risk for obstructive sleep apnea or OSA, and I think we should proceed with a sleep study to determine whether you do or do not have OSA and how severe it is. If you have more than mild OSA, I want you to consider treatment with CPAP. Please remember, the risks and ramifications of moderate to severe obstructive sleep apnea or OSA are: Cardiovascular disease, including congestive heart failure, stroke, difficult to control hypertension, arrhythmias, and even type 2 diabetes has been linked to untreated OSA. Sleep apnea causes disruption of sleep and sleep deprivation in most cases, which, in turn, can cause recurrent headaches, problems with memory, mood, concentration, focus, and vigilance. Most people with untreated sleep apnea report excessive daytime sleepiness, which can affect their ability to drive. Please do not drive if you feel sleepy.  I will see you back after your sleep study to go over the test results and where to go from there. We will call you after your sleep study and to set up an appointment at the time.   Please see your PCP today regarding your high blood pressure, your swelling and your impacted ear wax.

## 2013-02-21 ENCOUNTER — Ambulatory Visit (INDEPENDENT_AMBULATORY_CARE_PROVIDER_SITE_OTHER): Payer: Medicare Other | Admitting: Family Medicine

## 2013-02-21 ENCOUNTER — Encounter: Payer: Self-pay | Admitting: Family Medicine

## 2013-02-21 ENCOUNTER — Ambulatory Visit: Payer: Medicare Other | Admitting: Family Medicine

## 2013-02-21 VITALS — BP 130/98 | HR 78 | Temp 97.0°F | Resp 18 | Ht 74.0 in | Wt 234.0 lb

## 2013-02-21 DIAGNOSIS — I10 Essential (primary) hypertension: Secondary | ICD-10-CM

## 2013-02-21 DIAGNOSIS — D485 Neoplasm of uncertain behavior of skin: Secondary | ICD-10-CM

## 2013-02-21 NOTE — Progress Notes (Signed)
Subjective:    Patient ID: Matthew Luna, male    DOB: 08-09-1940, 73 y.o.   MRN: [id number]  HPI Positive for followup of his blood pressure. He is currently taking clonidine 0.1 mg tablets by mouth twice a day and amlodipine 5 mg by mouth daily. Since beginning amlodipine, the patient reports painful swelling in both feet. He has trace pitting edema on the dorsums of both feet and no edema  higher than his ankle.  His blood pressure has continued to run high even on amlodipine. He is averaging 1:30 to 170 over 90s at home. He is scheduled for a sleep study February 5. Past Medical History  Diagnosis Date  . Mononeuritis of unspecified site   . Actinic keratosis   . Nonspecific abnormal results of thyroid function study   . Hypertrophy of prostate without urinary obstruction and other lower urinary tract symptoms (LUTS)   . Unspecified essential hypertension   . Stricture and stenosis of esophagus   . Left sided ulcerative (chronic) colitis   . Duodenitis without mention of hemorrhage   . Esophageal reflux   . Diverticulosis of colon (without mention of hemorrhage)   . Prostate cancer     cryotherapy 2011 [facility])   Current Outpatient Prescriptions on File Prior to Visit  Medication Sig Dispense Refill  . citalopram (CELEXA) 20 MG tablet Take 20 mg by mouth daily.      . cloNIDine (CATAPRES) 0.1 MG tablet Take 1 tablet (0.1 mg total) by mouth 2 (two) times daily.  60 tablet  5  . esomeprazole (NEXIUM) 20 MG capsule Take 20 mg by mouth daily at 12 noon.       No current facility-administered medications on file prior to visit.   Allergies  Allergen Reactions  . Amoxicillin-Pot Clavulanate   . Ciprofloxacin   . Mesalamine     REACTION: Intolerance  . Metronidazole     REACTION: anaphylixix   History   Social History  . Marital Status: Married    Spouse Name: N/A    Number of Children: N/A  . Years of Education: N/A   Occupational History  . Not on file.   Social  History Main Topics  . Smoking status: Never Smoker   . Smokeless tobacco: Never Used  . Alcohol Use: 1.2 oz/week    2 Glasses of wine per week  . Drug Use: No  . Sexual Activity: Not on file   Other Topics Concern  . Not on file   Social History Narrative  . No narrative on file      Review of Systems  All other systems reviewed and are negative.       Objective:   Physical Exam  Vitals reviewed. Neck: Neck supple. No JVD present.  Cardiovascular: Normal rate and regular rhythm.  Exam reveals no gallop and no friction rub.   No murmur heard. Pulmonary/Chest: Effort normal and breath sounds normal. No respiratory distress. He has no wheezes. He has no rales.  Abdominal: Soft. Bowel sounds are normal. He exhibits no distension. There is no tenderness. There is no rebound and no guarding.  Musculoskeletal: He exhibits edema.   A. she has 3 actinic keratoses on his scalp he is requesting cryotherapy for they are approximately 4 mm to 5 mm in size       Assessment & Plan:  1. HTN (hypertension) Discontinue amlodipine. Replace with bystolic 10 mg poqday.  Recheck blood pressure in 2 weeks. Swelling in his knee  is not improving by next week I will give the patient Lasix to be used as needed.  2. Neoplasm of uncertain behavior of skin Patient has 3 actinic keratoses on his scalp and he is requesting cryotherapy for her. They're each possibly from 5 mm in size. They're erythematous plaques with fine white scale. Cryotherapy with liquid nitrogen was applied to each of these for a total of 20 seconds each

## 2013-02-26 ENCOUNTER — Other Ambulatory Visit (HOSPITAL_COMMUNITY): Payer: Self-pay | Admitting: Gastroenterology

## 2013-03-06 ENCOUNTER — Ambulatory Visit (INDEPENDENT_AMBULATORY_CARE_PROVIDER_SITE_OTHER): Payer: Medicare Other

## 2013-03-06 DIAGNOSIS — G4710 Hypersomnia, unspecified: Secondary | ICD-10-CM

## 2013-03-06 DIAGNOSIS — R519 Headache, unspecified: Secondary | ICD-10-CM

## 2013-03-06 DIAGNOSIS — I10 Essential (primary) hypertension: Secondary | ICD-10-CM

## 2013-03-06 DIAGNOSIS — H6120 Impacted cerumen, unspecified ear: Secondary | ICD-10-CM

## 2013-03-06 DIAGNOSIS — G4733 Obstructive sleep apnea (adult) (pediatric): Secondary | ICD-10-CM

## 2013-03-06 DIAGNOSIS — G4730 Sleep apnea, unspecified: Secondary | ICD-10-CM

## 2013-03-06 DIAGNOSIS — G479 Sleep disorder, unspecified: Secondary | ICD-10-CM

## 2013-03-06 DIAGNOSIS — G4761 Periodic limb movement disorder: Secondary | ICD-10-CM

## 2013-03-06 DIAGNOSIS — R600 Localized edema: Secondary | ICD-10-CM

## 2013-03-06 DIAGNOSIS — R51 Headache: Secondary | ICD-10-CM

## 2013-03-14 ENCOUNTER — Telehealth: Payer: Self-pay | Admitting: Neurology

## 2013-03-14 DIAGNOSIS — G4733 Obstructive sleep apnea (adult) (pediatric): Secondary | ICD-10-CM

## 2013-03-14 NOTE — Telephone Encounter (Signed)
Please call and notify patient that the recent sleep study confirmed the diagnosis of severe OSA. He did very well with CPAP during the study with significant improvement of the respiratory events. Therefore, I would like start the patient on CPAP at home. I placed the order in the chart.   Arrange for CPAP set up at home through a DME company of patient's choice and fax/route report to PCP and referring MD (if other than PCP).   The patient will also need a follow up appointment with me in 6-8 weeks post set up that has to be scheduled; help the patient schedule this (in a follow-up slot).   Please re-enforce the importance of compliance with treatment and the need for us to monitor compliance data.   Once you have spoken to the patient and scheduled the return appointment, you may close this encounter, thanks,   Kyla Duffy, MD, PhD [facility]    

## 2013-03-17 ENCOUNTER — Encounter: Payer: Self-pay | Admitting: *Deleted

## 2013-03-17 NOTE — Telephone Encounter (Signed)
I called and left a message for the patient about his recent sleep study results. Informed the patient that the study confirmed the diagnosis of severe obstructive sleep apnea and that he did well on CPAP during the night of his study. Dr. Rexene Alberts recommends he start CPAP therapy at home. I will fax his order to [facility] and they will contact his insurance carrier to get benefits for his CPAP machine and then contact him. I will fax a copy of the report to Dr. Samella Parr office and mail a copy of the report along with follow up instruction letter to the patient.

## 2013-03-20 ENCOUNTER — Encounter: Payer: Self-pay | Admitting: Family Medicine

## 2013-03-20 DIAGNOSIS — G4730 Sleep apnea, unspecified: Secondary | ICD-10-CM | POA: Insufficient documentation

## 2013-03-24 ENCOUNTER — Telehealth: Payer: Self-pay | Admitting: Family Medicine

## 2013-03-24 MED ORDER — NEBIVOLOL HCL 10 MG PO TABS: 10.0000 mg | ORAL_TABLET | Freq: Every day | ORAL | Status: DC

## 2013-03-24 NOTE — Telephone Encounter (Signed)
Rx Refilled  

## 2013-03-24 NOTE — Telephone Encounter (Signed)
Pharmacy [facility] [place] Call back number is [phone] Pt is wanting to know if you could call in [facility]

## 2013-03-26 ENCOUNTER — Telehealth: Payer: Self-pay | Admitting: Family Medicine

## 2013-03-26 NOTE — Telephone Encounter (Signed)
Try the biotene first and see if it helps.  How is his BP running?  Does he think his new CPAP machine may be drying out his mouth?

## 2013-03-26 NOTE — Telephone Encounter (Signed)
Pt called and states that the new BP medication is giving him severe dry mouth so bad that he feels like he is spitting out cotton. I told him to get some Biotene and try that and would send you a note about it to see if you wanted to change his medication?!? He is aware of our closing schedule due to the threat of snow.

## 2013-03-31 ENCOUNTER — Telehealth: Payer: Self-pay | Admitting: Family Medicine

## 2013-03-31 DIAGNOSIS — I10 Essential (primary) hypertension: Secondary | ICD-10-CM

## 2013-03-31 NOTE — Telephone Encounter (Signed)
Call back number is [phone] Pt is wanting a different medication because nebivolol (BYSTOLIC) 10 MG tablet is causing him to have a dry mouth.

## 2013-04-01 MED ORDER — DOXAZOSIN MESYLATE 4 MG PO TABS: 4.0000 mg | ORAL_TABLET | Freq: Every day | ORAL | Status: DC

## 2013-04-01 NOTE — Telephone Encounter (Signed)
Spoke to patient.  Told him, per Dr Dennard Schaumann, Stop Clonidine.  New Rx for Doxazosin 4 mg QD to pharmacy.  Told patient to come by office in a couple weeks for nurse visit BP check.

## 2013-04-01 NOTE — Telephone Encounter (Signed)
I promise him it is NOT the bystolic.  However, clonidine can cause dry mouth in up to 40% of patients who take it.  Stop clonidine and replace with doxazosin 4 mg poqday.  Recheck BP in 2 weeks.

## 2013-04-04 ENCOUNTER — Other Ambulatory Visit: Payer: Self-pay | Admitting: Family Medicine

## 2013-04-04 NOTE — Telephone Encounter (Signed)
Refill appropriate and filled per protocol. 

## 2013-04-12 ENCOUNTER — Other Ambulatory Visit: Payer: Self-pay | Admitting: Family Medicine

## 2013-04-30 ENCOUNTER — Ambulatory Visit: Payer: Medicare Other | Admitting: Family Medicine

## 2013-04-30 ENCOUNTER — Telehealth: Payer: Self-pay | Admitting: *Deleted

## 2013-04-30 NOTE — Telephone Encounter (Signed)
Pt called stating that he is not feeling well and believes that his medication is causing him to feel this way, he called earlier in the month of march with same symptoms and was told to come in couple weeks for BP check nurse visit.I went over his medications states he is taking them all as directed. Told pt to come in the am to have BP checked.

## 2013-05-01 ENCOUNTER — Ambulatory Visit: Payer: Medicare Other | Admitting: *Deleted

## 2013-05-01 DIAGNOSIS — I10 Essential (primary) hypertension: Secondary | ICD-10-CM

## 2013-05-01 NOTE — Progress Notes (Signed)
Patient ID: Matthew Luna, male   DOB: 11/03/1940, 73 y.o.   MRN: [id number]  Patient seen today for medication review and BP check.   BP noted as 132/78.   Patient reports that he has been experiencing light headedness and headaches.   Medication review shows discrepancy between Amlodipine prescription and chart.   Prescription in chart states he is supposed to be taking Amlodipine 10mg  (1/2) tab PO QD. Medication bottle shows Amlodipine 10 mg (1) tab PO QD.   MD made aware and noted to D/C Amlodipine per last office visit.   Patient made aware.

## 2013-05-09 ENCOUNTER — Encounter: Payer: Self-pay | Admitting: Family Medicine

## 2013-05-09 ENCOUNTER — Ambulatory Visit (INDEPENDENT_AMBULATORY_CARE_PROVIDER_SITE_OTHER): Payer: Medicare Other | Admitting: Family Medicine

## 2013-05-09 VITALS — BP 130/90 | HR 76 | Temp 97.0°F | Resp 18 | Ht 74.0 in | Wt 230.0 lb

## 2013-05-09 DIAGNOSIS — I10 Essential (primary) hypertension: Secondary | ICD-10-CM

## 2013-05-09 DIAGNOSIS — G4733 Obstructive sleep apnea (adult) (pediatric): Secondary | ICD-10-CM

## 2013-05-09 DIAGNOSIS — R51 Headache: Secondary | ICD-10-CM

## 2013-05-09 NOTE — Progress Notes (Signed)
Subjective:    Patient ID: Matthew Luna, male    DOB: 1940-03-26, 73 y.o.   MRN: [id number]  HPI Patient is a very pleasant 73 year old white male who suffered with hypertension for quite some time. He quit taking his doxazosin due to dizziness. His blood pressure has risen since he discontinued doxazosin.  Patient had a sleep study earlier this year which revealed an apnea hypopnea index of 75 events per hour.  He was titrated to a CPAP of 13 cm.  However the patient only the machine for one week to 2 discomfort. He continues to have a dull headache every day. He also complains of severe fatigue and drowsiness every day. He is interested in other options to help treat his sleep apnea. Past Medical History  Diagnosis Date  . Mononeuritis of unspecified site   . Actinic keratosis   . Nonspecific abnormal results of thyroid function study   . Hypertrophy of prostate without urinary obstruction and other lower urinary tract symptoms (LUTS)   . Unspecified essential hypertension   . Stricture and stenosis of esophagus   . Left sided ulcerative (chronic) colitis   . Duodenitis without mention of hemorrhage   . Esophageal reflux   . Diverticulosis of colon (without mention of hemorrhage)   . Prostate cancer     cryotherapy 2011 [facility])  . Sleep apnea     ahi 75, cpap 13    Current Outpatient Prescriptions on File Prior to Visit  Medication Sig Dispense Refill  . citalopram (CELEXA) 40 MG tablet TAKE 1 TABLET BY MOUTH EVERY DAY  30 tablet  5  . doxazosin (CARDURA) 4 MG tablet Take 1 tablet (4 mg total) by mouth daily.  30 tablet  1  . esomeprazole (NEXIUM) 20 MG capsule Take 20 mg by mouth daily at 12 noon.      . fluticasone (FLONASE) 50 MCG/ACT nasal spray USE 2 SPRAYS EVERY DAY AS DIRECTED  16 g  11  . nebivolol (BYSTOLIC) 10 MG tablet Take 1 tablet (10 mg total) by mouth daily.  30 tablet  5   No current facility-administered medications on file prior to visit.   Allergies  Allergen  Reactions  . Amoxicillin-Pot Clavulanate   . Ciprofloxacin   . Mesalamine     REACTION: Intolerance  . Metronidazole     REACTION: anaphylixix   History   Social History  . Marital Status: Married    Spouse Name: N/A    Number of Children: N/A  . Years of Education: N/A   Occupational History  . Not on file.   Social History Main Topics  . Smoking status: Never Smoker   . Smokeless tobacco: Never Used  . Alcohol Use: 1.2 oz/week    2 Glasses of wine per week  . Drug Use: No  . Sexual Activity: Not on file   Other Topics Concern  . Not on file   Social History Narrative  . No narrative on file    .Review of Systems  All other systems reviewed and are negative.      Objective:   Physical Exam  Vitals reviewed. Constitutional: He is oriented to person, place, and time.  Neck: Neck supple.  Cardiovascular: Normal rate, regular rhythm and normal heart sounds.   Pulmonary/Chest: Effort normal and breath sounds normal. No respiratory distress. He has no wheezes. He has no rales.  Lymphadenopathy:    He has no cervical adenopathy.  Neurological: He is alert and oriented to  person, place, and time. He has normal reflexes. He displays normal reflexes. No cranial nerve deficit. He exhibits normal muscle tone. Coordination normal.  Patient has a short mandible. He also has excessive soft tissue in his soft palate. The nasal passages are swollen with enlarged turbinates.        Assessment & Plan:  Headache(784.0)  Obstructive sleep apnea - Plan: Ambulatory referral to ENT  HTN (hypertension) I believe the patient's medical problems are interrelated.  I believe his blood pressure, hysterectomy, and his headache are related to the obstructive sleep apnea. In the meantime his blood pressure with Bystolic.  He can discontinue doxazosin which can cause dizziness.  I will consult Dr. Delight Ovens for possible UVVVP to address his obstructive sleep apnea. He may also possibly  benefit from a dental appliance as he refuses CPAP.  I will defer to Dr. Darien Ramus judgement.

## 2013-05-16 ENCOUNTER — Ambulatory Visit: Payer: Medicare Other | Admitting: Family Medicine

## 2013-05-16 ENCOUNTER — Ambulatory Visit (INDEPENDENT_AMBULATORY_CARE_PROVIDER_SITE_OTHER): Payer: Medicare Other | Admitting: Family Medicine

## 2013-05-16 ENCOUNTER — Encounter: Payer: Self-pay | Admitting: Family Medicine

## 2013-05-16 VITALS — BP 120/80 | HR 74 | Temp 97.2°F | Resp 18 | Ht 74.0 in | Wt 233.0 lb

## 2013-05-16 DIAGNOSIS — I10 Essential (primary) hypertension: Secondary | ICD-10-CM

## 2013-05-16 LAB — CBC WITH DIFFERENTIAL/PLATELET
Basophils Absolute: 0.1 10*3/uL (ref 0.0–0.1)
Basophils Relative: 2 % — ABNORMAL HIGH (ref 0–1)
Eosinophils Absolute: 0.6 10*3/uL (ref 0.0–0.7)
Eosinophils Relative: 9 % — ABNORMAL HIGH (ref 0–5)
HCT: 42.9 % (ref 39.0–52.0)
Hemoglobin: 14.6 g/dL (ref 13.0–17.0)
Lymphocytes Relative: 32 % (ref 12–46)
Lymphs Abs: 2.2 10*3/uL (ref 0.7–4.0)
MCH: 31.3 pg (ref 26.0–34.0)
MCHC: 34 g/dL (ref 30.0–36.0)
MCV: 91.9 fL (ref 78.0–100.0)
Monocytes Absolute: 0.4 10*3/uL (ref 0.1–1.0)
Monocytes Relative: 6 % (ref 3–12)
Neutro Abs: 3.5 10*3/uL (ref 1.7–7.7)
Neutrophils Relative %: 51 % (ref 43–77)
Platelets: 306 10*3/uL (ref 150–400)
RBC: 4.67 MIL/uL (ref 4.22–5.81)
RDW: 14.6 % (ref 11.5–15.5)
WBC: 6.9 10*3/uL (ref 4.0–10.5)

## 2013-05-16 LAB — BASIC METABOLIC PANEL WITH GFR
BUN: 20 mg/dL (ref 6–23)
CO2: 25 mEq/L (ref 19–32)
Calcium: 9.6 mg/dL (ref 8.4–10.5)
Chloride: 105 mEq/L (ref 96–112)
Creat: 1.56 mg/dL — ABNORMAL HIGH (ref 0.50–1.35)
GFR, Est African American: 51 mL/min — ABNORMAL LOW
GFR, Est Non African American: 44 mL/min — ABNORMAL LOW
Glucose, Bld: 107 mg/dL — ABNORMAL HIGH (ref 70–99)
Potassium: 4.4 mEq/L (ref 3.5–5.3)
Sodium: 141 mEq/L (ref 135–145)

## 2013-05-16 NOTE — Progress Notes (Signed)
Subjective:    Patient ID: Matthew Luna, male    DOB: [birth year], 73 y.o.   MRN: [id number]  HPI 05/09/13 Patient is a very pleasant 73 year old white male who suffered with hypertension for quite some time. He quit taking his doxazosin due to dizziness. His blood pressure has risen since he discontinued doxazosin.  Patient had a sleep study earlier this year which revealed an apnea hypopnea index of 75 events per hour.  He was titrated to a CPAP of 13 cm.  However the patient only the machine for one week to 2 discomfort. He continues to have a dull headache every day. He also complains of severe fatigue and drowsiness every day. He is interested in other options to help treat his sleep apnea. At that time, my plan was: HTN (hypertension) - Plan: BASIC METABOLIC PANEL WITH GFR, CBC with Differential I believe the patient's medical problems are interrelated.  I believe his blood pressure, hysterectomy, and his headache are related to the obstructive sleep apnea. In the meantime his blood pressure with Bystolic.  He can discontinue doxazosin which can cause dizziness.  I will consult Dr. Delight Ovens for possible UVVVP to address his obstructive sleep apnea. He may also possibly benefit from a dental appliance as he refuses CPAP.  I will defer to Dr. Darien Ramus judgement.  05/16/13 Patient continues to feel dizzy. He blames this on the Bystolic. His blood pressure is ranging 120-180/80-100 at home. He would like to try switching off the Bystolic. Past Medical History  Diagnosis Date  . Mononeuritis of unspecified site   . Actinic keratosis   . Nonspecific abnormal results of thyroid function study   . Hypertrophy of prostate without urinary obstruction and other lower urinary tract symptoms (LUTS)   . Unspecified essential hypertension   . Stricture and stenosis of esophagus   . Left sided ulcerative (chronic) colitis   . Duodenitis without mention of hemorrhage   . Esophageal reflux   .  Diverticulosis of colon (without mention of hemorrhage)   . Prostate cancer     cryotherapy 2011 [facility])  . Sleep apnea     ahi 75, cpap 13    Current Outpatient Prescriptions on File Prior to Visit  Medication Sig Dispense Refill  . citalopram (CELEXA) 40 MG tablet TAKE 1 TABLET BY MOUTH EVERY DAY  30 tablet  5  . doxazosin (CARDURA) 4 MG tablet Take 1 tablet (4 mg total) by mouth daily.  30 tablet  1  . esomeprazole (NEXIUM) 20 MG capsule Take 20 mg by mouth daily at 12 noon.      . fluticasone (FLONASE) 50 MCG/ACT nasal spray USE 2 SPRAYS EVERY DAY AS DIRECTED  16 g  11   No current facility-administered medications on file prior to visit.   Allergies  Allergen Reactions  . Amoxicillin-Pot Clavulanate   . Ciprofloxacin   . Mesalamine     REACTION: Intolerance  . Metronidazole     REACTION: anaphylixix   History   Social History  . Marital Status: Married    Spouse Name: N/A    Number of Children: N/A  . Years of Education: N/A   Occupational History  . Not on file.   Social History Main Topics  . Smoking status: Never Smoker   . Smokeless tobacco: Never Used  . Alcohol Use: 1.2 oz/week    2 Glasses of wine per week  . Drug Use: No  . Sexual Activity: Not on file   Other  Topics Concern  . Not on file   Social History Narrative  . No narrative on file    .Review of Systems  All other systems reviewed and are negative.      Objective:   Physical Exam  Vitals reviewed. Constitutional: He is oriented to person, place, and time.  Neck: Neck supple.  Cardiovascular: Normal rate, regular rhythm and normal heart sounds.   Pulmonary/Chest: Effort normal and breath sounds normal. No respiratory distress. He has no wheezes. He has no rales.  Lymphadenopathy:    He has no cervical adenopathy.  Neurological: He is alert and oriented to person, place, and time. He has normal reflexes. No cranial nerve deficit. He exhibits normal muscle tone. Coordination normal.           Assessment & Plan:  1. HTN (hypertension) Discontinue bystolic.  Start Azor 5/40, 1/2 pill poqday and recheck BP in 2 weeks. - BASIC METABOLIC PANEL WITH GFR - CBC with Differential

## 2013-05-26 ENCOUNTER — Encounter: Payer: Self-pay | Admitting: Family Medicine

## 2013-05-26 ENCOUNTER — Telehealth: Payer: Self-pay | Admitting: Neurology

## 2013-05-26 ENCOUNTER — Ambulatory Visit (INDEPENDENT_AMBULATORY_CARE_PROVIDER_SITE_OTHER): Payer: Medicare Other | Admitting: Family Medicine

## 2013-05-26 ENCOUNTER — Ambulatory Visit: Payer: Medicare Other | Admitting: Family Medicine

## 2013-05-26 ENCOUNTER — Encounter: Payer: Self-pay | Admitting: Neurology

## 2013-05-26 VITALS — BP 100/64 | HR 66 | Temp 97.0°F | Resp 18 | Ht 74.0 in | Wt 230.0 lb

## 2013-05-26 DIAGNOSIS — I10 Essential (primary) hypertension: Secondary | ICD-10-CM

## 2013-05-26 DIAGNOSIS — R42 Dizziness and giddiness: Secondary | ICD-10-CM

## 2013-05-26 NOTE — Progress Notes (Signed)
Subjective:    Patient ID: Matthew Luna, male    DOB: January 20, 1941, 73 y.o.   MRN: [id number]  HPI 05/09/13 Patient is a very pleasant 73 year old white male who suffered with hypertension for quite some time. He quit taking his doxazosin due to dizziness. His blood pressure has risen since he discontinued doxazosin.  Patient had a sleep study earlier this year which revealed an apnea hypopnea index of 75 events per hour.  He was titrated to a CPAP of 13 cm.  However the patient only the machine for one week to 2 discomfort. He continues to have a dull headache every day. He also complains of severe fatigue and drowsiness every day. He is interested in other options to help treat his sleep apnea. At that time, my plan was: HTN (hypertension) - Plan: BASIC METABOLIC PANEL WITH GFR, CBC with Differential I believe the patient's medical problems are interrelated.  I believe his blood pressure, hysterectomy, and his headache are related to the obstructive sleep apnea. In the meantime his blood pressure with Bystolic.  He can discontinue doxazosin which can cause dizziness.  I will consult Dr. Delight Ovens for possible UVVVP to address his obstructive sleep apnea. He may also possibly benefit from a dental appliance as he refuses CPAP.  I will defer to Dr. Darien Ramus judgement.  05/16/13 Patient continues to feel dizzy. He blames this on the Bystolic. His blood pressure is ranging 120-180/80-100 at home. He would like to try switching off the Bystolic.  At that time, my plan was: 1. HTN (hypertension) Discontinue bystolic.  Start Azor 5/40, 1/2 pill poqday and recheck BP in 2 weeks. - BASIC METABOLIC PANEL WITH GFR - CBC with Differential  05/26/13 Patient continues to feel dizzy. He denies any vertigo. He denies any syncope. His neurologic exam is completely normal. He had CT scan of the brain in January 2015 was completely normal. He had an MRA on the head and neck in 2009:  Question mild stenosis  vertebral artery origins bilaterally.  This appears less than 50% bilaterally but is worse right than  left.  2. No significant carotid artery disease proximally.  3. Minimal supraclinoid narrowing is confirmed.  He continues to dizzy. He denies any change in symptoms with standing. He denies any orthostatic dizziness. The symptoms are no better since switching his blood pressure medicine. He is now tried and failed 5 different agents for hypertension each of them causing dizziness.  Past Medical History  Diagnosis Date  . Mononeuritis of unspecified site   . Actinic keratosis   . Nonspecific abnormal results of thyroid function study   . Hypertrophy of prostate without urinary obstruction and other lower urinary tract symptoms (LUTS)   . Unspecified essential hypertension   . Stricture and stenosis of esophagus   . Left sided ulcerative (chronic) colitis   . Duodenitis without mention of hemorrhage   . Esophageal reflux   . Diverticulosis of colon (without mention of hemorrhage)   . Prostate cancer     cryotherapy 2011 [facility])  . Sleep apnea     ahi 75, cpap 13    Current Outpatient Prescriptions on File Prior to Visit  Medication Sig Dispense Refill  . citalopram (CELEXA) 40 MG tablet TAKE 1 TABLET BY MOUTH EVERY DAY  30 tablet  5  . doxazosin (CARDURA) 4 MG tablet Take 1 tablet (4 mg total) by mouth daily.  30 tablet  1  . esomeprazole (NEXIUM) 20 MG capsule Take 20 mg  by mouth daily at 12 noon.      . fluticasone (FLONASE) 50 MCG/ACT nasal spray USE 2 SPRAYS EVERY DAY AS DIRECTED  16 g  11   No current facility-administered medications on file prior to visit.   Allergies  Allergen Reactions  . Amoxicillin-Pot Clavulanate   . Ciprofloxacin   . Mesalamine     REACTION: Intolerance  . Metronidazole     REACTION: anaphylixix   History   Social History  . Marital Status: Married    Spouse Name: N/A    Number of Children: N/A  . Years of Education: N/A   Occupational  History  . Not on file.   Social History Main Topics  . Smoking status: Never Smoker   . Smokeless tobacco: Never Used  . Alcohol Use: 1.2 oz/week    2 Glasses of wine per week  . Drug Use: No  . Sexual Activity: Not on file   Other Topics Concern  . Not on file   Social History Narrative  . No narrative on file    .Review of Systems  All other systems reviewed and are negative.      Objective:   Physical Exam  Vitals reviewed. Constitutional: He is oriented to person, place, and time.  Neck: Neck supple.  Cardiovascular: Normal rate, regular rhythm and normal heart sounds.   Pulmonary/Chest: Effort normal and breath sounds normal. No respiratory distress. He has no wheezes. He has no rales.  Lymphadenopathy:    He has no cervical adenopathy.  Neurological: He is alert and oriented to person, place, and time. He has normal reflexes. No cranial nerve deficit. He exhibits normal muscle tone. Coordination normal.          Assessment & Plan:  HTN (hypertension)  Dizziness and giddiness  I explained to the patient that I did not reason his blood pressure pills causing this. I want him to stop all of his blood pressure pills for the next 3 days. I want him to call me in 3 days. If the dizziness is still there, I know it is not his blood pressure medicine. I will have him resume his blood pressure medicine and workup of dizziness further with an MRI of the brain and MRA of the brain and neck to evaluate for vertebrobasilar insufficiency.  If the dizziness does go off the medication, I did schedule the patient to 25 and with her blood pressure monitor to confirm that the patient truly doesn't fact have hypertension and then try to start him on an agent which he can tolerate

## 2013-05-27 NOTE — Telephone Encounter (Signed)
Notification received from [facility] that this patient turned in CPAP machine.  Called pt to investigate and he has various reasons why he does not want to use CPAP, but primarily, it seems he had extreme difficulty with adjusting.  He complained of sore throat, a "frozen nose", he travels with a racing team and it inconvenienced him to have to bring CPAP along with him because he "already had enough to keep up with".  Regarding the CPAP, he says "It don't fix nothin".  I reminded pt of severe osa and how CPAP use during testing eliminated apneic events.  He is going to see if he can have his throat lasered and use breath right strips.  If the physician says that they cannot do anything with his throat, he will call us again.

## 2013-05-30 ENCOUNTER — Telehealth: Payer: Self-pay | Admitting: Family Medicine

## 2013-05-30 ENCOUNTER — Other Ambulatory Visit: Payer: Self-pay | Admitting: Family Medicine

## 2013-05-30 DIAGNOSIS — R51 Headache: Secondary | ICD-10-CM

## 2013-05-30 DIAGNOSIS — R42 Dizziness and giddiness: Secondary | ICD-10-CM

## 2013-05-30 MED ORDER — NEBIVOLOL HCL 5 MG PO TABS
5.0000 mg | ORAL_TABLET | Freq: Every day | ORAL | Status: DC
Start: 2013-05-30 — End: 2013-06-20

## 2013-05-30 NOTE — Telephone Encounter (Signed)
Pt called and states that his BP is 183/107 and he has a dull headache. The dizziness is still there some but not as bad as before. What to do?  Per [facility] restart bystolic 5 mg qd and will get MRI & MRA. Pt aware and med sent to pharm.

## 2013-06-05 NOTE — Progress Notes (Signed)
Quick Note:  I reviewed the patient's CPAP compliance data from 04/09/2013 to 04/22/2013, which is a total of 14 days, during which time the patient used CPAP only for 4 days. The average usage for all days was minimal at a few seconds. Essentially the patient has not been using CPAP. He called his home health company to return his CPAP machine as I understand. We will get in touch with the patient to discuss his sleep study results and treatment options. Please call patient to schedule appt.   Star Age, MD, PhD [facility] ([facility]   ______

## 2013-06-10 ENCOUNTER — Ambulatory Visit: Payer: Self-pay | Admitting: Family Medicine

## 2013-06-19 ENCOUNTER — Telehealth: Payer: Self-pay | Admitting: *Deleted

## 2013-06-19 NOTE — Telephone Encounter (Signed)
If he is still dizzy, I want him to see neurology.

## 2013-06-19 NOTE — Telephone Encounter (Signed)
Received a call from [facility] radiology and they stated that pt insurance did not authorize the MR Angiogram of Neck because it did not meet the requirements  and that pt needed to cancel appt for today. I cancelled and called pt to informed him. Is there something else that needs to be done? Please advise.

## 2013-06-19 NOTE — Telephone Encounter (Signed)
Pt claims he is not dizzy as bad as he was, says he is feeling better says if he eats like he should he isnt as bad.Pt wants to hold off on Neurology for now.  Pt also wants to know if you can put him back on Bystolic 10 mg instead of 5mg , claims it is the same price as 5mg  and will take 1/2 tab of the 10mg .  Pharmacy [facility] [place]

## 2013-06-20 MED ORDER — NEBIVOLOL HCL 10 MG PO TABS: 10.0000 mg | ORAL_TABLET | Freq: Every day | ORAL | Status: DC

## 2013-06-20 NOTE — Telephone Encounter (Signed)
bystolic called to pts pharmacy and pt aware of change.

## 2013-06-20 NOTE — Telephone Encounter (Signed)
Okay, please call out bystolic 10 mg poqday 30 Rf 5

## 2013-06-25 ENCOUNTER — Encounter: Payer: Self-pay | Admitting: Family Medicine

## 2013-07-11 ENCOUNTER — Encounter: Payer: Self-pay | Admitting: Family Medicine

## 2013-10-17 ENCOUNTER — Encounter: Payer: Self-pay | Admitting: Family Medicine

## 2013-10-17 ENCOUNTER — Ambulatory Visit (INDEPENDENT_AMBULATORY_CARE_PROVIDER_SITE_OTHER): Payer: Medicare Other | Admitting: Family Medicine

## 2013-10-17 VITALS — BP 136/82 | HR 76 | Temp 97.6°F | Resp 16 | Ht 74.0 in | Wt 232.0 lb

## 2013-10-17 DIAGNOSIS — M25569 Pain in unspecified knee: Secondary | ICD-10-CM

## 2013-10-17 NOTE — Progress Notes (Signed)
Subjective:    Patient ID: Matthew Luna, male    DOB: [birth year], 73 y.o.   MRN: [id number]  HPI  Reports 2 weeks of pain in the posterior thigh that is worse in hte morning and better as the day progresses.  The muscles/hamstrings feel tight and sore.  He denies any injury.  He denies symptoms of cauda equina syndrome.  He denies claudication.  He denies arthralgias.   Past Medical History  Diagnosis Date  . Mononeuritis of unspecified site   . Actinic keratosis   . Nonspecific abnormal results of thyroid function study   . Hypertrophy of prostate without urinary obstruction and other lower urinary tract symptoms (LUTS)   . Unspecified essential hypertension   . Stricture and stenosis of esophagus   . Left sided ulcerative (chronic) colitis   . Duodenitis without mention of hemorrhage   . Esophageal reflux   . Diverticulosis of colon (without mention of hemorrhage)   . Prostate cancer     cryotherapy 2011 [facility])  . Sleep apnea     ahi 75, cpap 13    Past Surgical History  Procedure Laterality Date  . Cholecystectomy    . Prostate surgery    . Esophagogastroduodenoscopy  03/22/2011    Procedure: ESOPHAGOGASTRODUODENOSCOPY (EGD);  Surgeon: Inda Castle, MD;  Location: [facility];  Service: Endoscopy;  Laterality: N/A;  . Balloon dilation  03/22/2011    Procedure: BALLOON DILATION;  Surgeon: Inda Castle, MD;  Location: [facility];  Service: Endoscopy;  Laterality: N/A;  kelly/ebp   Current Outpatient Prescriptions on File Prior to Visit  Medication Sig Dispense Refill  . citalopram (CELEXA) 40 MG tablet TAKE 1 TABLET BY MOUTH EVERY DAY  30 tablet  5  . esomeprazole (NEXIUM) 20 MG capsule Take 20 mg by mouth daily at 12 noon.      . fluticasone (FLONASE) 50 MCG/ACT nasal spray USE 2 SPRAYS EVERY DAY AS DIRECTED  16 g  11  . nebivolol (BYSTOLIC) 10 MG tablet Take 1 tablet (10 mg total) by mouth daily.  30 tablet  5   No current facility-administered medications on file  prior to visit.   Allergies  Allergen Reactions  . Amoxicillin-Pot Clavulanate   . Ciprofloxacin   . Mesalamine     REACTION: Intolerance  . Metronidazole     REACTION: anaphylixix   History   Social History  . Marital Status: Married    Spouse Name: N/A    Number of Children: N/A  . Years of Education: N/A   Occupational History  . Not on file.   Social History Main Topics  . Smoking status: Never Smoker   . Smokeless tobacco: Never Used  . Alcohol Use: 1.2 oz/week    2 Glasses of wine per week  . Drug Use: No  . Sexual Activity: Not on file   Other Topics Concern  . Not on file   Social History Narrative  . No narrative on file     Review of Systems  All other systems reviewed and are negative.      Objective:   Physical Exam  Vitals reviewed. Cardiovascular: Normal rate, regular rhythm, normal heart sounds and intact distal pulses.   Pulmonary/Chest: Effort normal and breath sounds normal. No respiratory distress. He has no wheezes. He has no rales.  Abdominal: Soft. Bowel sounds are normal.  Musculoskeletal: Normal range of motion.       Lumbar back: Normal.  Right upper leg: He exhibits no tenderness, no bony tenderness, no swelling, no edema, no deformity and no laceration.       Left upper leg: He exhibits no tenderness, no bony tenderness, no swelling, no edema, no deformity and no laceration.          Assessment & Plan:  Pain in joint, lower leg, unspecified laterality - Plan: BASIC METABOLIC PANEL WITH GFR  I suspect muscle strain in the hamstrings.  I have recommended a series of stretches and PT to imrpove hamstring flexibility and see if symptoms improve.  I see no eidence of cauda equina syndrome.

## 2013-10-18 LAB — BASIC METABOLIC PANEL WITH GFR
BUN: 23 mg/dL (ref 6–23)
CO2: 26 mEq/L (ref 19–32)
Calcium: 9.5 mg/dL (ref 8.4–10.5)
Chloride: 105 mEq/L (ref 96–112)
Creat: 1.5 mg/dL — ABNORMAL HIGH (ref 0.50–1.35)
GFR, Est African American: 53 mL/min — ABNORMAL LOW
GFR, Est Non African American: 46 mL/min — ABNORMAL LOW
Glucose, Bld: 70 mg/dL (ref 70–99)
Potassium: 4.5 mEq/L (ref 3.5–5.3)
Sodium: 141 mEq/L (ref 135–145)

## 2013-10-20 ENCOUNTER — Encounter: Payer: Self-pay | Admitting: *Deleted

## 2013-11-03 ENCOUNTER — Encounter: Payer: Self-pay | Admitting: Vascular Surgery

## 2013-11-04 ENCOUNTER — Encounter: Payer: Self-pay | Admitting: Vascular Surgery

## 2013-11-04 ENCOUNTER — Ambulatory Visit
Admission: RE | Admit: 2013-11-04 | Discharge: 2013-11-04 | Disposition: A | Discharge status: Home / Self Care | Payer: Medicare Other | Source: Ambulatory Visit | Attending: Vascular Surgery | Admitting: Vascular Surgery

## 2013-11-04 ENCOUNTER — Ambulatory Visit (INDEPENDENT_AMBULATORY_CARE_PROVIDER_SITE_OTHER): Payer: Medicare Other | Admitting: Vascular Surgery

## 2013-11-04 VITALS — BP 155/90 | HR 58 | Resp 14 | Ht 75.0 in | Wt 234.0 lb

## 2013-11-04 DIAGNOSIS — I714 Abdominal aortic aneurysm, without rupture, unspecified: Secondary | ICD-10-CM

## 2013-11-04 MED ORDER — IOHEXOL 350 MG/ML SOLN
70.0000 mL | Freq: Once | INTRAVENOUS | Status: AC | PRN
  Administered 2013-11-04: 70 mL via INTRAVENOUS

## 2013-11-04 NOTE — Progress Notes (Signed)
Subjective:     Patient ID: Matthew Luna, male   DOB: [birth year], 73 y.o.   MRN: [id number]  HPI this 73 year old male was referred for evaluation of abdominal aortic aneurysm. He was referred by Dr. Dennard Schaumann. He denies any abdominal or back symptoms. CT scan was recently obtained which revealed the aneurysm to be 4.3 centimeters in maximum diameter he has no specific symptoms other than some right inguinal and left calf discomfort with certain activity. This is relieved by ibuprofen.  Past Medical History  Diagnosis Date  . Mononeuritis of unspecified site   . Actinic keratosis   . Nonspecific abnormal results of thyroid function study   . Hypertrophy of prostate without urinary obstruction and other lower urinary tract symptoms (LUTS)   . Unspecified essential hypertension   . Stricture and stenosis of esophagus   . Left sided ulcerative (chronic) colitis   . Duodenitis without mention of hemorrhage   . Esophageal reflux   . Diverticulosis of colon (without mention of hemorrhage)   . Prostate cancer     cryotherapy 2011 [facility])  . Sleep apnea     ahi 75, cpap 13     History  Substance Use Topics  . Smoking status: Never Smoker   . Smokeless tobacco: Never Used  . Alcohol Use: 1.2 oz/week    2 Glasses of wine per week    Family History  Problem Relation Age of Onset  . Cancer    . Anesthesia problems Neg Hx   . Hypotension Neg Hx   . Malignant hyperthermia Neg Hx   . Pseudochol deficiency Neg Hx   . Cancer Mother   . Snoring Father     Allergies  Allergen Reactions  . Amoxicillin-Pot Clavulanate   . Ciprofloxacin   . Mesalamine     REACTION: Intolerance  . Metronidazole     REACTION: anaphylixix    Current outpatient prescriptions:citalopram (CELEXA) 40 MG tablet, TAKE 1 TABLET BY MOUTH EVERY DAY, Disp: 30 tablet, Rfl: 5;  esomeprazole (NEXIUM) 20 MG capsule, Take 10 mg by mouth daily at 12 noon. , Disp: , Rfl: ;  fluticasone (FLONASE) 50 MCG/ACT nasal spray, USE 2  SPRAYS EVERY DAY AS DIRECTED, Disp: 16 g, Rfl: 11;  nebivolol (BYSTOLIC) 10 MG tablet, Take 1 tablet (10 mg total) by mouth daily., Disp: 30 tablet, Rfl: 5  BP 155/90  Pulse 58  Resp 14  Ht 6\' 3"  (1.905 m)  Wt 234 lb (106.142 kg)  BMI 29.25 kg/m2  Body mass index is 29.25 kg/(m^2).           Review of Systems has history of dizziness. Also obstructive uropathy with urinary frequency and also sleep apnea. Denies chest pain, dyspnea on exertion, PND, orthopnea, hemoptysis. Other systems negative complete review of systems    Objective:   Physical Exam BP 155/90  Pulse 58  Resp 14  Ht 6\' 3"  (1.905 m)  Wt 234 lb (106.142 kg)  BMI 29.25 kg/m2  Gen.-alert and oriented x3 in no apparent distress HEENT normal for age Lungs no rhonchi or wheezing Cardiovascular regular rhythm no murmurs carotid pulses 3+ palpable no bruits audible Abdomen soft nontender no palpable masses Musculoskeletal free of  major deformities Skin clear -no rashes Neurologic normal Lower extremities 3+ femoral and dorsalis pedis pulses palpable bilaterally with no edema  Today I reviewed the CT angiogram of the abdomen and pelvis by computer. There is a infrarenal abdominal aortic aneurysm which extends up to near the renal  arteries with some laminated thrombus at that area. Maximum diameter is slightly over 4 cm.        Assessment:     Abdominal aortic aneurysm slightly greater than 4.0 cm by CT angiogram    Plan:     Return in one year for duplex scan and see nurse practitioner in our office Patient complains of right inguinal discomfort and left calf discomfort. Neither of these are vascular in origin since patient has excellent peripheral pulses bilaterally. May need referral to orthopedic surgeon by his medical doctor

## 2013-11-05 ENCOUNTER — Other Ambulatory Visit: Payer: Self-pay | Admitting: *Deleted

## 2013-11-05 DIAGNOSIS — I714 Abdominal aortic aneurysm, without rupture, unspecified: Secondary | ICD-10-CM

## 2013-11-18 ENCOUNTER — Telehealth: Payer: Self-pay | Admitting: *Deleted

## 2013-11-18 DIAGNOSIS — M545 Low back pain: Secondary | ICD-10-CM

## 2013-11-18 NOTE — Telephone Encounter (Signed)
Received a call from [place] stating pt is having Lower back pain and wants to know since we are his PCP if we can order xray for pt on his back? Please fax results back to [phone]

## 2013-11-18 NOTE — Telephone Encounter (Signed)
Ok with l spine xray.

## 2013-11-19 NOTE — Telephone Encounter (Signed)
X-ray ordered.

## 2013-11-24 ENCOUNTER — Ambulatory Visit (HOSPITAL_COMMUNITY)
Admission: RE | Admit: 2013-11-24 | Discharge: 2013-11-24 | Disposition: A | Discharge status: Home / Self Care | Payer: Medicare Other | Source: Ambulatory Visit | Attending: Family Medicine | Admitting: Family Medicine

## 2013-11-24 DIAGNOSIS — M545 Low back pain: Secondary | ICD-10-CM | POA: Insufficient documentation

## 2013-11-25 ENCOUNTER — Other Ambulatory Visit: Payer: Self-pay | Admitting: Family Medicine

## 2013-11-25 ENCOUNTER — Telehealth: Payer: Self-pay | Admitting: Family Medicine

## 2013-11-25 ENCOUNTER — Encounter: Payer: Medicare Other | Admitting: Family Medicine

## 2013-11-25 MED ORDER — PREDNISONE 20 MG PO TABS: ORAL_TABLET | ORAL | Status: DC

## 2013-11-25 NOTE — Telephone Encounter (Signed)
When patient check out you were in a room, and he thought that you had said for him to be seen in 2 weeks.

## 2013-11-27 NOTE — Progress Notes (Signed)
This encounter was created in error - please disregard.

## 2013-12-05 ENCOUNTER — Ambulatory Visit (INDEPENDENT_AMBULATORY_CARE_PROVIDER_SITE_OTHER): Payer: Medicare Other | Admitting: Family Medicine

## 2013-12-05 DIAGNOSIS — Z23 Encounter for immunization: Secondary | ICD-10-CM

## 2014-03-05 ENCOUNTER — Telehealth: Payer: Self-pay | Admitting: Family Medicine

## 2014-03-05 NOTE — Telephone Encounter (Signed)
Patient is seeing chiropractor in [place], dr taylor, because of insurance they need referral  Please call him at [phone] if any questions  And dr taylors number is [phone]

## 2014-03-09 NOTE — Telephone Encounter (Signed)
Contacted Museum/gallery conservator at [facility] to inquire about this pts referral for insurance and she stated that she called his insurance and he does not require a referral at this time. I left message on pts voice mail stating this and that is was taken care of.

## 2014-04-28 ENCOUNTER — Other Ambulatory Visit: Payer: Self-pay | Admitting: Family Medicine

## 2014-04-28 NOTE — Telephone Encounter (Signed)
Refill appropriate and filled per protocol. 

## 2014-05-10 ENCOUNTER — Observation Stay (HOSPITAL_COMMUNITY)
Admission: EM | Admit: 2014-05-10 | Discharge: 2014-05-12 | Disposition: A | Discharge status: Home / Self Care | Payer: PPO | Attending: Internal Medicine | Admitting: Internal Medicine

## 2014-05-10 ENCOUNTER — Encounter (HOSPITAL_COMMUNITY): Payer: Self-pay

## 2014-05-10 ENCOUNTER — Emergency Department (HOSPITAL_COMMUNITY): Payer: PPO

## 2014-05-10 DIAGNOSIS — R634 Abnormal weight loss: Secondary | ICD-10-CM | POA: Insufficient documentation

## 2014-05-10 DIAGNOSIS — Z888 Allergy status to other drugs, medicaments and biological substances status: Secondary | ICD-10-CM | POA: Diagnosis not present

## 2014-05-10 DIAGNOSIS — G4730 Sleep apnea, unspecified: Secondary | ICD-10-CM | POA: Diagnosis not present

## 2014-05-10 DIAGNOSIS — K219 Gastro-esophageal reflux disease without esophagitis: Secondary | ICD-10-CM | POA: Diagnosis not present

## 2014-05-10 DIAGNOSIS — I129 Hypertensive chronic kidney disease with stage 1 through stage 4 chronic kidney disease, or unspecified chronic kidney disease: Secondary | ICD-10-CM | POA: Diagnosis not present

## 2014-05-10 DIAGNOSIS — Z8546 Personal history of malignant neoplasm of prostate: Secondary | ICD-10-CM | POA: Insufficient documentation

## 2014-05-10 DIAGNOSIS — F1099 Alcohol use, unspecified with unspecified alcohol-induced disorder: Secondary | ICD-10-CM | POA: Diagnosis not present

## 2014-05-10 DIAGNOSIS — N400 Enlarged prostate without lower urinary tract symptoms: Secondary | ICD-10-CM | POA: Insufficient documentation

## 2014-05-10 DIAGNOSIS — Z6828 Body mass index (BMI) 28.0-28.9, adult: Secondary | ICD-10-CM | POA: Diagnosis not present

## 2014-05-10 DIAGNOSIS — I714 Abdominal aortic aneurysm, without rupture, unspecified: Secondary | ICD-10-CM | POA: Diagnosis present

## 2014-05-10 DIAGNOSIS — Z9049 Acquired absence of other specified parts of digestive tract: Secondary | ICD-10-CM | POA: Insufficient documentation

## 2014-05-10 DIAGNOSIS — R001 Bradycardia, unspecified: Secondary | ICD-10-CM | POA: Insufficient documentation

## 2014-05-10 DIAGNOSIS — E785 Hyperlipidemia, unspecified: Secondary | ICD-10-CM | POA: Insufficient documentation

## 2014-05-10 DIAGNOSIS — I10 Essential (primary) hypertension: Secondary | ICD-10-CM | POA: Diagnosis present

## 2014-05-10 DIAGNOSIS — Z881 Allergy status to other antibiotic agents status: Secondary | ICD-10-CM | POA: Insufficient documentation

## 2014-05-10 DIAGNOSIS — G459 Transient cerebral ischemic attack, unspecified: Secondary | ICD-10-CM | POA: Diagnosis not present

## 2014-05-10 DIAGNOSIS — H532 Diplopia: Secondary | ICD-10-CM

## 2014-05-10 DIAGNOSIS — R42 Dizziness and giddiness: Secondary | ICD-10-CM

## 2014-05-10 DIAGNOSIS — K222 Esophageal obstruction: Secondary | ICD-10-CM | POA: Diagnosis not present

## 2014-05-10 DIAGNOSIS — Z7951 Long term (current) use of inhaled steroids: Secondary | ICD-10-CM | POA: Insufficient documentation

## 2014-05-10 DIAGNOSIS — N183 Chronic kidney disease, stage 3 unspecified: Secondary | ICD-10-CM

## 2014-05-10 DIAGNOSIS — Z7982 Long term (current) use of aspirin: Secondary | ICD-10-CM | POA: Insufficient documentation

## 2014-05-10 DIAGNOSIS — Z7902 Long term (current) use of antithrombotics/antiplatelets: Secondary | ICD-10-CM | POA: Insufficient documentation

## 2014-05-10 DIAGNOSIS — K5190 Ulcerative colitis, unspecified, without complications: Secondary | ICD-10-CM | POA: Diagnosis not present

## 2014-05-10 DIAGNOSIS — I672 Cerebral atherosclerosis: Secondary | ICD-10-CM

## 2014-05-10 DIAGNOSIS — G4733 Obstructive sleep apnea (adult) (pediatric): Secondary | ICD-10-CM | POA: Diagnosis not present

## 2014-05-10 LAB — CBG MONITORING, ED: Glucose-Capillary: 90 mg/dL (ref 70–99)

## 2014-05-10 LAB — I-STAT CHEM 8, ED
BUN: 25 mg/dL — ABNORMAL HIGH (ref 6–23)
Calcium, Ion: 1.13 mmol/L (ref 1.13–1.30)
Chloride: 106 mmol/L (ref 96–112)
Creatinine, Ser: 1.3 mg/dL (ref 0.50–1.35)
Glucose, Bld: 103 mg/dL — ABNORMAL HIGH (ref 70–99)
HCT: 45 % (ref 39.0–52.0)
Hemoglobin: 15.3 g/dL (ref 13.0–17.0)
Potassium: 3.7 mmol/L (ref 3.5–5.1)
Sodium: 142 mmol/L (ref 135–145)
TCO2: 22 mmol/L (ref 0–100)

## 2014-05-10 LAB — PROTIME-INR
INR: 0.97 (ref 0.00–1.49)
Prothrombin Time: 13 seconds (ref 11.6–15.2)

## 2014-05-10 LAB — COMPREHENSIVE METABOLIC PANEL
ALT: 12 U/L (ref 0–53)
AST: 21 U/L (ref 0–37)
Albumin: 3.3 g/dL — ABNORMAL LOW (ref 3.5–5.2)
Alkaline Phosphatase: 83 U/L (ref 39–117)
Anion gap: 9 (ref 5–15)
BUN: 22 mg/dL (ref 6–23)
CO2: 24 mmol/L (ref 19–32)
Calcium: 8.8 mg/dL (ref 8.4–10.5)
Chloride: 106 mmol/L (ref 96–112)
Creatinine, Ser: 1.4 mg/dL — ABNORMAL HIGH (ref 0.50–1.35)
GFR calc Af Amer: 56 mL/min — ABNORMAL LOW (ref >90)
GFR calc non Af Amer: 49 mL/min — ABNORMAL LOW (ref >90)
Glucose, Bld: 104 mg/dL — ABNORMAL HIGH (ref 70–99)
Potassium: 3.8 mmol/L (ref 3.5–5.1)
Sodium: 139 mmol/L (ref 135–145)
Total Bilirubin: 0.6 mg/dL (ref 0.3–1.2)
Total Protein: 6.2 g/dL (ref 6.0–8.3)

## 2014-05-10 LAB — DIFFERENTIAL
Basophils Absolute: 0.1 10*3/uL (ref 0.0–0.1)
Basophils Relative: 1 % (ref 0–1)
Eosinophils Absolute: 0.8 10*3/uL — ABNORMAL HIGH (ref 0.0–0.7)
Eosinophils Relative: 7 % — ABNORMAL HIGH (ref 0–5)
Lymphocytes Relative: 29 % (ref 12–46)
Lymphs Abs: 3.1 10*3/uL (ref 0.7–4.0)
Monocytes Absolute: 0.6 10*3/uL (ref 0.1–1.0)
Monocytes Relative: 6 % (ref 3–12)
Neutro Abs: 6.2 10*3/uL (ref 1.7–7.7)
Neutrophils Relative %: 57 % (ref 43–77)

## 2014-05-10 LAB — I-STAT TROPONIN, ED: Troponin i, poc: 0 ng/mL (ref 0.00–0.08)

## 2014-05-10 LAB — CBC
HCT: 42.3 % (ref 39.0–52.0)
Hemoglobin: 14.2 g/dL (ref 13.0–17.0)
MCH: 32.3 pg (ref 26.0–34.0)
MCHC: 33.6 g/dL (ref 30.0–36.0)
MCV: 96.1 fL (ref 78.0–100.0)
Platelets: 213 10*3/uL (ref 150–400)
RBC: 4.4 MIL/uL (ref 4.22–5.81)
RDW: 14 % (ref 11.5–15.5)
WBC: 10.8 10*3/uL — ABNORMAL HIGH (ref 4.0–10.5)

## 2014-05-10 LAB — APTT: aPTT: 29 seconds (ref 24–37)

## 2014-05-10 MED ORDER — ASPIRIN 325 MG PO TABS
325.0000 mg | ORAL_TABLET | ORAL | Status: AC
  Administered 2014-05-10: 325 mg via ORAL
  Filled 2014-05-10: qty 1

## 2014-05-10 NOTE — Consult Note (Signed)
Stroke Consult    Chief Complaint: dizziness, right sided weakness  HPI: Matthew Luna is an 74 y.o. male hx of HTN, prostate CA presenting with acute onset of dizziness, blurred vision and right sided weakness. LSW at 1500 when he developed dizziness. A few hours later noted blurred/double vision and right sided weakness. Upon EMS arrival, BP noted to be 160/100, CBG 87. Upon arrival to ED weakness had resolved, notes continued mild dizziness.   No prior CVA or TIA history. Has MRA head from 2009 showing bilateral vertebral stenosis R>L. Does not take a daily antiplatelet agent.   Date last known well: 05/10/14 Time last known well: 1500 tPA Given: no, outside tPA window. Initial NIHSS of 0  Past Medical History  Diagnosis Date  . Mononeuritis of unspecified site   . Actinic keratosis   . Nonspecific abnormal results of thyroid function study   . Hypertrophy of prostate without urinary obstruction and other lower urinary tract symptoms (LUTS)   . Unspecified essential hypertension   . Stricture and stenosis of esophagus   . Left sided ulcerative (chronic) colitis   . Duodenitis without mention of hemorrhage   . Esophageal reflux   . Diverticulosis of colon (without mention of hemorrhage)   . Prostate cancer     cryotherapy 2011 [facility])  . Sleep apnea     ahi 75, cpap 13     Past Surgical History  Procedure Laterality Date  . Cholecystectomy    . Prostate surgery    . Esophagogastroduodenoscopy  03/22/2011    Procedure: ESOPHAGOGASTRODUODENOSCOPY (EGD);  Surgeon: Inda Castle, MD;  Location: [facility];  Service: Endoscopy;  Laterality: N/A;  . Balloon dilation  03/22/2011    Procedure: BALLOON DILATION;  Surgeon: Inda Castle, MD;  Location: [facility];  Service: Endoscopy;  Laterality: N/A;  kelly/ebp    Family History  Problem Relation Age of Onset  . Cancer    . Anesthesia problems Neg Hx   . Hypotension Neg Hx   . Malignant hyperthermia Neg Hx   . Pseudochol  deficiency Neg Hx   . Cancer Mother   . Snoring Father    Social History:  reports that he has never smoked. He has never used smokeless tobacco. He reports that he drinks about 1.2 oz of alcohol per week. He reports that he does not use illicit drugs.  Allergies:  Allergies  Allergen Reactions  . Amoxicillin-Pot Clavulanate   . Ciprofloxacin   . Mesalamine     REACTION: Intolerance  . Metronidazole     REACTION: anaphylixix     (Not in a hospital admission)  ROS: Out of a complete 14 system review, the patient complains of only the following symptoms, and all other reviewed systems are negative. + slurred speech   Physical Examination: Filed Vitals:   05/10/14 2145  BP: 168/73  Pulse: 51  Temp:   Resp: 22   Physical Exam  Constitutional: He appears well-developed and well-nourished.  Psych: Affect appropriate to situation Eyes: No scleral injection HENT: No OP obstrucion Head: Normocephalic.  Cardiovascular: Normal rate and regular rhythm.  Respiratory: Effort normal and breath sounds normal.  GI: Soft. Bowel sounds are normal. No distension. There is no tenderness.  Skin: WDI   Neurologic Examination: Mental Status: Alert, oriented, thought content appropriate.  Speech fluent without evidence of aphasia. Apparent dysarthria (though family states baseline)  Able to follow 3 step commands without difficulty. Cranial Nerves: II: optic discs not visualized, visual fields  grossly normal, pupils equal, round, reactive to light and accommodation III,IV, VI: ptosis not present, extra-ocular motions intact bilaterally V,VII: smile symmetric, facial light touch sensation normal bilaterally VIII: hearing normal bilaterally IX,X: gag reflex present XI: trapezius strength/neck flexion strength normal bilaterally XII: tongue strength normal  Motor: Right : Upper extremity    Left:     Upper extremity 5/5 deltoid       5/5 deltoid 5/5 biceps      5/5 biceps  5/5  triceps      5/5 triceps 5/5 hand grip      5/5 hand grip  Lower extremity     Lower extremity 5/5 hip flexor      5/5 hip flexor 5/5 quadricep      5/5 quadriceps  5/5 hamstrings     5/5 hamstrings 5/5 plantar flexion       5/5 plantar flexion 5/5 plantar extension     5/5 plantar extension Tone and bulk:normal tone throughout; no atrophy noted Sensory: Pinprick and light touch intact throughout, bilaterally Deep Tendon Reflexes: 2+ and symmetric throughout Plantars: Right: downgoing   Left: downgoing Cerebellar: normal finger-to-nose, and normal heel-to-shin test Gait: deferred  Laboratory Studies:   Basic Metabolic Panel:  Recent Labs Lab 05/10/14 2132  NA 142  K 3.7  CL 106  GLUCOSE 103*  BUN 25*  CREATININE 1.30    Liver Function Tests: No results for input(s): AST, ALT, ALKPHOS, BILITOT, PROT, ALBUMIN in the last 168 hours. No results for input(s): LIPASE, AMYLASE in the last 168 hours. No results for input(s): AMMONIA in the last 168 hours.  CBC:  Recent Labs Lab 05/10/14 2125 05/10/14 2132  WBC 10.8*  --   NEUTROABS 6.2  --   HGB 14.2 15.3  HCT 42.3 45.0  MCV 96.1  --   PLT 213  --     Cardiac Enzymes: No results for input(s): CKTOTAL, CKMB, CKMBINDEX, TROPONINI in the last 168 hours.  BNP: Invalid input(s): POCBNP  CBG: No results for input(s): GLUCAP in the last 168 hours.  Microbiology: No results found for this or any previous visit.  Coagulation Studies: No results for input(s): LABPROT, INR in the last 72 hours.  Urinalysis: No results for input(s): COLORURINE, LABSPEC, PHURINE, GLUCOSEU, HGBUR, BILIRUBINUR, KETONESUR, PROTEINUR, UROBILINOGEN, NITRITE, LEUKOCYTESUR in the last 168 hours.  Invalid input(s): APPERANCEUR  Lipid Panel:  No results found for: CHOL, TRIG, HDL, CHOLHDL, VLDL, LDLCALC  HgbA1C: No results found for: HGBA1C  Urine Drug Screen:  No results found for: LABOPIA, COCAINSCRNUR, LABBENZ, AMPHETMU, THCU, LABBARB   Alcohol Level: No results for input(s): ETH in the last 168 hours.  Other results: EKG: normal EKG, normal sinus rhythm.  Imaging: Ct Head (brain) Wo Contrast  05/10/2014   CLINICAL DATA:  Code stroke.  Right-sided weakness.  EXAM: CT HEAD WITHOUT CONTRAST  TECHNIQUE: Contiguous axial images were obtained from the base of the skull through the vertex without intravenous contrast.  COMPARISON:  CT head without contrast 02/02/2013  FINDINGS: The basal ganglia and insular cortex is intact bilaterally. No acute cortical infarct, hemorrhage, or mass lesion is present. Mild generalized atrophy is within normal limits for age.  Vascular calcifications are present in the dural scratch the vascular calcifications are present in the vertebral arteries at the dural margin. There are vascular calcifications in the cavernous internal carotid arteries as well. The paranasal sinuses and mastoid air cells are clear. The calvarium is intact. A remote right frontal sinus fracture is  again seen. The globes and orbits are within normal limits.  ASPECTS score = 10/10  [place] Stroke Program Early CT Score  Normal score = 10  IMPRESSION: 1. No acute intracranial abnormality. 2. Mild atrophy is within normal limits for age. 3. Remote right frontal sinus fracture.   Electronically Signed   By: San Morelle M.D.   On: 05/10/2014 21:41    Assessment: 74 y.o. male with history of HTN presenting with acute onset of dizziness, double vision and right sided weakness. Symptoms resolved by time of arrival to ED. CT head negative. Will admit for TIA workup.    Plan: 1. HgbA1c, fasting lipid panel 2. MRI, MRA  of the brain without contrast 3. PT consult, OT consult, Speech consult 4. Echocardiogram 5. Carotid dopplers 6. Prophylactic therapy-ASA 81mg  daily 7. Risk factor modification 8. Telemetry monitoring 9. Frequent neuro checks 10. NPO until RN stroke swallow screen    Jim Like,  DO [facility] [phone]  If 7pm- 7am, please page neurology on call as listed in [place]. 05/10/2014, 9:50 PM

## 2014-05-10 NOTE — Code Documentation (Signed)
Mr. Baze presented to the ED this evening after developing blurred vision and weakness.  He states he was mowing his yard this afternoon around 1500 & developed dizziness.  He felt it was due to low blood sugar so ate a snack & then sat in his recliner to watch TV.  He developed blurred & double vision while watching TV & became unsteady when he got from his recliner.  He was unsteady & his wife called EMS.  NIH 0 with blurred vision but the double vision had resolved.  Plan admit to medicine for work up.

## 2014-05-10 NOTE — ED Provider Notes (Signed)
CSN: [id number]     Arrival date & time 05/10/14  2121 History   First MD Initiated Contact with Patient 05/10/14 2132     Chief Complaint  Patient presents with  . Code Stroke     (Consider location/radiation/quality/duration/timing/severity/associated sxs/prior Treatment) Patient is a 74 y.o. male presenting with neurologic complaint. The history is provided by the patient.  Neurologic Problem This is a new problem. Episode onset: 4-6 hrs. The problem occurs constantly. The problem has been gradually improving. Pertinent negatives include no chest pain, no abdominal pain, no headaches and no shortness of breath. Nothing aggravates the symptoms. Nothing relieves the symptoms. He has tried nothing for the symptoms. The treatment provided no relief.    Past Medical History  Diagnosis Date  . Mononeuritis of unspecified site   . Actinic keratosis   . Nonspecific abnormal results of thyroid function study   . Hypertrophy of prostate without urinary obstruction and other lower urinary tract symptoms (LUTS)   . Unspecified essential hypertension   . Stricture and stenosis of esophagus   . Left sided ulcerative (chronic) colitis   . Duodenitis without mention of hemorrhage   . Esophageal reflux   . Diverticulosis of colon (without mention of hemorrhage)   . Prostate cancer     cryotherapy 2011 [facility])  . Sleep apnea     ahi 75, cpap 13    Past Surgical History  Procedure Laterality Date  . Cholecystectomy    . Prostate surgery    . Esophagogastroduodenoscopy  03/22/2011    Procedure: ESOPHAGOGASTRODUODENOSCOPY (EGD);  Surgeon: Inda Castle, MD;  Location: [facility];  Service: Endoscopy;  Laterality: N/A;  . Balloon dilation  03/22/2011    Procedure: BALLOON DILATION;  Surgeon: Inda Castle, MD;  Location: [facility];  Service: Endoscopy;  Laterality: N/A;  kelly/ebp   Family History  Problem Relation Age of Onset  . Cancer    . Anesthesia problems Neg Hx   . Hypotension  Neg Hx   . Malignant hyperthermia Neg Hx   . Pseudochol deficiency Neg Hx   . Cancer Mother   . Snoring Father    History  Substance Use Topics  . Smoking status: Never Smoker   . Smokeless tobacco: Never Used  . Alcohol Use: 1.2 oz/week    2 Glasses of wine per week    Review of Systems  Constitutional: Negative for fever.  HENT: Negative for drooling and rhinorrhea.   Eyes: Negative for pain.  Respiratory: Negative for cough and shortness of breath.   Cardiovascular: Negative for chest pain and leg swelling.  Gastrointestinal: Negative for nausea, vomiting, abdominal pain and diarrhea.  Genitourinary: Negative for dysuria and hematuria.  Musculoskeletal: Negative for gait problem and neck pain.  Skin: Negative for color change.  Neurological: Negative for numbness and headaches.  Hematological: Negative for adenopathy.  Psychiatric/Behavioral: Negative for behavioral problems.  All other systems reviewed and are negative.     Allergies  Amoxicillin-pot clavulanate; Ciprofloxacin; Mesalamine; and Metronidazole  Home Medications   Prior to Admission medications   Medication Sig Start Date End Date Taking? Authorizing Provider  citalopram (CELEXA) 40 MG tablet TAKE 1 TABLET BY MOUTH EVERY DAY 04/28/14   Susy Frizzle, MD  esomeprazole (NEXIUM) 20 MG capsule Take 10 mg by mouth daily at 12 noon.     Historical Provider, MD  fluticasone (FLONASE) 50 MCG/ACT nasal spray USE 2 SPRAYS EVERY DAY AS DIRECTED    Susy Frizzle, MD  nebivolol (  BYSTOLIC) 10 MG tablet Take 1 tablet (10 mg total) by mouth daily. 06/20/13   Susy Frizzle, MD  predniSONE (DELTASONE) 20 MG tablet 3 tabs po qd x 2 days, 2 tabs po qd x 2 days, 1 tab po qd x 2 days 11/25/13   Susy Frizzle, MD   BP 168/73 mmHg  Pulse 51  Temp(Src) 97.5 F (36.4 C) (Oral)  Resp 22  Ht 6\' 3"  (1.905 m)  Wt 235 lb 7.2 oz (106.8 kg)  BMI 29.43 kg/m2  SpO2 96% Physical Exam  Constitutional: He is oriented to  person, place, and time. He appears well-developed and well-nourished.  HENT:  Head: Normocephalic and atraumatic.  Right Ear: External ear normal.  Left Ear: External ear normal.  Nose: Nose normal.  Mouth/Throat: Oropharynx is clear and moist. No oropharyngeal exudate.  Eyes: Conjunctivae and EOM are normal. Pupils are equal, round, and reactive to light.  Neck: Normal range of motion. Neck supple.  Cardiovascular: Normal rate, regular rhythm, normal heart sounds and intact distal pulses.  Exam reveals no gallop and no friction rub.   No murmur heard. Pulmonary/Chest: Effort normal and breath sounds normal. No respiratory distress. He has no wheezes.  Abdominal: Soft. Bowel sounds are normal. He exhibits no distension. There is no tenderness. There is no rebound and no guarding.  Musculoskeletal: Normal range of motion. He exhibits no edema or tenderness.  Normal strength and sensation in all extremities.  Neurological: He is alert and oriented to person, place, and time.  Skin: Skin is warm and dry.  Psychiatric: He has a normal mood and affect. His behavior is normal.  Nursing note and vitals reviewed.   ED Course  Procedures (including critical care time) Labs Review Labs Reviewed  CBC - Abnormal; Notable for the following:    WBC 10.8 (*)    All other components within normal limits  DIFFERENTIAL - Abnormal; Notable for the following:    Eosinophils Relative 7 (*)    Eosinophils Absolute 0.8 (*)    All other components within normal limits  I-STAT CHEM 8, ED - Abnormal; Notable for the following:    BUN 25 (*)    Glucose, Bld 103 (*)    All other components within normal limits  PROTIME-INR  APTT  COMPREHENSIVE METABOLIC PANEL  I-STAT TROPOININ, ED  CBG MONITORING, ED    Imaging Review Ct Head (brain) Wo Contrast  05/10/2014   CLINICAL DATA:  Code stroke.  Right-sided weakness.  EXAM: CT HEAD WITHOUT CONTRAST  TECHNIQUE: Contiguous axial images were obtained from  the base of the skull through the vertex without intravenous contrast.  COMPARISON:  CT head without contrast 02/02/2013  FINDINGS: The basal ganglia and insular cortex is intact bilaterally. No acute cortical infarct, hemorrhage, or mass lesion is present. Mild generalized atrophy is within normal limits for age.  Vascular calcifications are present in the dural scratch the vascular calcifications are present in the vertebral arteries at the dural margin. There are vascular calcifications in the cavernous internal carotid arteries as well. The paranasal sinuses and mastoid air cells are clear. The calvarium is intact. A remote right frontal sinus fracture is again seen. The globes and orbits are within normal limits.  ASPECTS score = 10/10  [place] Stroke Program Early CT Score  Normal score = 10  IMPRESSION: 1. No acute intracranial abnormality. 2. Mild atrophy is within normal limits for age. 3. Remote right frontal sinus fracture.   Electronically Signed  By: San Morelle M.D.   On: 05/10/2014 21:41     EKG Interpretation   Date/Time:  Sunday May 10 2014 21:44:34 EDT Ventricular Rate:  53 PR Interval:  198 QRS Duration: 116 QT Interval:  475 QTC Calculation: 446 R Axis:   -56 Text Interpretation:  Sinus rhythm Left anterior fascicular block No  significant change since last tracing Confirmed by Neko Mcgeehan  MD, Jordani Nunn  ([id number]) on 05/10/2014 9:48:21 PM      MDM   Final diagnoses:  Dizziness  Diplopia    9:49 PM 74 y.o. male w hx of HTN who presents with dizziness which began around 3 PM today while mowing the lawn. He notes that it was mild and got worse when he developed some double vision around 7 PM while watching TV. His symptoms have since slowly resolved. He is afebrile and vital signs are unremarkable here. He presented as a code stroke. Neurology recommends admission to medicine.    Pamella Pert, MD 05/10/14 2310

## 2014-05-10 NOTE — H&P (Signed)
PCP:   Odette Fraction, MD   Chief Complaint:  Blurred vision  HPI: 74 yo male with blurred vision and right arm numbness earlier today at home, called 911.  Resolved soon after arrival to ED, code stroke was called and pt was evaluated immediately by neuro team.  sympotms have remarkably improved.  No recent illness.  No fevers.  No prior h/o cva/tia.  Does not take daily aspirin.  Review of Systems:  Positive and negative as per HPI otherwise all other systems are negative  Past Medical History: Past Medical History  Diagnosis Date  . Mononeuritis of unspecified site   . Actinic keratosis   . Nonspecific abnormal results of thyroid function study   . Hypertrophy of prostate without urinary obstruction and other lower urinary tract symptoms (LUTS)   . Unspecified essential hypertension   . Stricture and stenosis of esophagus   . Left sided ulcerative (chronic) colitis   . Duodenitis without mention of hemorrhage   . Esophageal reflux   . Diverticulosis of colon (without mention of hemorrhage)   . Prostate cancer     cryotherapy 2011 [facility])  . Sleep apnea     ahi 75, cpap 13    Past Surgical History  Procedure Laterality Date  . Cholecystectomy    . Prostate surgery    . Esophagogastroduodenoscopy  03/22/2011    Procedure: ESOPHAGOGASTRODUODENOSCOPY (EGD);  Surgeon: Inda Castle, MD;  Location: [facility];  Service: Endoscopy;  Laterality: N/A;  . Balloon dilation  03/22/2011    Procedure: BALLOON DILATION;  Surgeon: Inda Castle, MD;  Location: [facility];  Service: Endoscopy;  Laterality: N/A;  kelly/ebp    Medications: Prior to Admission medications   Medication Sig Start Date End Date Taking? Authorizing Provider  citalopram (CELEXA) 10 MG tablet Take 5 mg by mouth daily.   Yes Historical Provider, MD  fluticasone (FLONASE) 50 MCG/ACT nasal spray USE 2 SPRAYS EVERY DAY AS DIRECTED Patient taking differently: USE 1 SPRAY each nostril daily as needed.   Yes  Susy Frizzle, MD  Misc. Throat Products (BREATHE RIGHT SNORE RELIEF MT) Use as directed 1 strip in the mouth or throat at bedtime.   Yes Historical Provider, MD  nebivolol (BYSTOLIC) 10 MG tablet Take 1 tablet (10 mg total) by mouth daily. Patient taking differently: Take 5 mg by mouth 2 (two) times daily.  06/20/13  Yes Susy Frizzle, MD  predniSONE (DELTASONE) 20 MG tablet 3 tabs po qd x 2 days, 2 tabs po qd x 2 days, 1 tab po qd x 2 days Patient not taking: Reported on 05/10/2014 11/25/13   Susy Frizzle, MD    Allergies:   Allergies  Allergen Reactions  . Metronidazole Anaphylaxis  . Amoxicillin-Pot Clavulanate Other (See Comments)    UNKNOWN  . Ciprofloxacin Other (See Comments)    UNKNOWN  . Mesalamine     REACTION: Intolerance    Social History:  reports that he has never smoked. He has never used smokeless tobacco. He reports that he drinks about 1.2 oz of alcohol per week. He reports that he does not use illicit drugs.  Family History: Family History  Problem Relation Age of Onset  . Cancer    . Anesthesia problems Neg Hx   . Hypotension Neg Hx   . Malignant hyperthermia Neg Hx   . Pseudochol deficiency Neg Hx   . Cancer Mother   . Snoring Father     Physical Exam: Filed Vitals:  05/10/14 2142 05/10/14 2145  BP: 183/84 168/73  Pulse: 53 51  Temp: 97.5 F (36.4 C)   TempSrc: Oral   Resp: 17 22  Height: 6\' 3"  (1.905 m)   Weight: 106.8 kg (235 lb 7.2 oz)   SpO2: 97% 96%   General appearance: alert, cooperative and no distress Head: Normocephalic, without obvious abnormality, atraumatic Eyes: negative Nose: Nares normal. Septum midline. Mucosa normal. No drainage or sinus tenderness. Neck: no JVD and supple, symmetrical, trachea midline Lungs: clear to auscultation bilaterally Heart: regular rate and rhythm, S1, S2 normal, no murmur, click, rub or gallop Abdomen: soft, non-tender; bowel sounds normal; no masses,  no organomegaly Extremities:  extremities normal, atraumatic, no cyanosis or edema Pulses: 2+ and symmetric Skin: Skin color, texture, turgor normal. No rashes or lesions Neurologic: Grossly normal    Labs on Admission:   Recent Labs  05/10/14 2125 05/10/14 2132  NA 139 142  K 3.8 3.7  CL 106 106  CO2 24  --   GLUCOSE 104* 103*  BUN 22 25*  CREATININE 1.40* 1.30  CALCIUM 8.8  --     Recent Labs  05/10/14 2125  AST 21  ALT 12  ALKPHOS 83  BILITOT 0.6  PROT 6.2  ALBUMIN 3.3*    Recent Labs  05/10/14 2125 05/10/14 2132  WBC 10.8*  --   NEUTROABS 6.2  --   HGB 14.2 15.3  HCT 42.3 45.0  MCV 96.1  --   PLT 213  --    Radiological Exams on Admission: Ct Head (brain) Wo Contrast  05/10/2014   CLINICAL DATA:  Code stroke.  Right-sided weakness.  EXAM: CT HEAD WITHOUT CONTRAST  TECHNIQUE: Contiguous axial images were obtained from the base of the skull through the vertex without intravenous contrast.  COMPARISON:  CT head without contrast 02/02/2013  FINDINGS: The basal ganglia and insular cortex is intact bilaterally. No acute cortical infarct, hemorrhage, or mass lesion is present. Mild generalized atrophy is within normal limits for age.  Vascular calcifications are present in the dural scratch the vascular calcifications are present in the vertebral arteries at the dural margin. There are vascular calcifications in the cavernous internal carotid arteries as well. The paranasal sinuses and mastoid air cells are clear. The calvarium is intact. A remote right frontal sinus fracture is again seen. The globes and orbits are within normal limits.  ASPECTS score = 10/10  [place] Stroke Program Early CT Score  Normal score = 10  IMPRESSION: 1. No acute intracranial abnormality. 2. Mild atrophy is within normal limits for age. 3. Remote right frontal sinus fracture.   Electronically Signed   By: San Morelle M.D.   On: 05/10/2014 21:41    Assessment/Plan  74 yo male with tia like symptoms  Principal  Problem:   TIA (transient ischemic attack)- full cva w/u.  Neuro following.  Daily aspirin.  Obs on tele.  Active Problems:  Stable unless o/w noted   Essential hypertension   AAA (abdominal aortic aneurysm) without rupture   Sleep apnea   Diplopia  Seen before midnight.  obs on tele.  Full code.  Sayvon Arterberry A 05/10/2014, 11:04 PM

## 2014-05-10 NOTE — ED Notes (Signed)
Pt stated that he started feeling dizzy late this afternoon, around 1600. The pt thought that maybe it was because he didn't eat, so he ate something and took the other half of his blood pressure pill and sat in his chair and started watching TV. While watching tv the pt started noticing that his eyes started getting blurry, he eventually started seeing "3 televisions". The pt attempted to get up around 1930 and had difficulty. The pt felt like his right side wasn't working, the pts wife attempted to get him up, when he was able to get up he "was bouncing around like a ping pong ball". The pts family also said that they thought his right eye was a little droopy.

## 2014-05-11 ENCOUNTER — Observation Stay (HOSPITAL_COMMUNITY): Payer: PPO

## 2014-05-11 ENCOUNTER — Encounter (HOSPITAL_COMMUNITY): Payer: Self-pay | Admitting: Radiology

## 2014-05-11 DIAGNOSIS — I10 Essential (primary) hypertension: Secondary | ICD-10-CM | POA: Diagnosis not present

## 2014-05-11 DIAGNOSIS — N183 Chronic kidney disease, stage 3 (moderate): Secondary | ICD-10-CM | POA: Diagnosis not present

## 2014-05-11 DIAGNOSIS — I714 Abdominal aortic aneurysm, without rupture: Secondary | ICD-10-CM | POA: Diagnosis not present

## 2014-05-11 DIAGNOSIS — E785 Hyperlipidemia, unspecified: Secondary | ICD-10-CM

## 2014-05-11 DIAGNOSIS — G450 Vertebro-basilar artery syndrome: Secondary | ICD-10-CM

## 2014-05-11 DIAGNOSIS — I672 Cerebral atherosclerosis: Secondary | ICD-10-CM | POA: Diagnosis not present

## 2014-05-11 DIAGNOSIS — G4730 Sleep apnea, unspecified: Secondary | ICD-10-CM | POA: Diagnosis not present

## 2014-05-11 DIAGNOSIS — H532 Diplopia: Secondary | ICD-10-CM | POA: Diagnosis not present

## 2014-05-11 DIAGNOSIS — G459 Transient cerebral ischemic attack, unspecified: Secondary | ICD-10-CM | POA: Diagnosis not present

## 2014-05-11 LAB — LIPID PANEL
Cholesterol: 222 mg/dL — ABNORMAL HIGH (ref 0–200)
HDL: 34 mg/dL — ABNORMAL LOW (ref >39)
LDL Cholesterol: 170 mg/dL — ABNORMAL HIGH (ref 0–99)
Total CHOL/HDL Ratio: 6.5 RATIO
Triglycerides: 91 mg/dL (ref <150)
VLDL: 18 mg/dL (ref 0–40)

## 2014-05-11 MED ORDER — SODIUM CHLORIDE 0.9 % IV SOLN: 250.0000 mL | INTRAVENOUS | Status: DC | PRN

## 2014-05-11 MED ORDER — STROKE: EARLY STAGES OF RECOVERY BOOK
Freq: Once | Status: AC
  Administered 2014-05-11: 03:00:00

## 2014-05-11 MED ORDER — SODIUM CHLORIDE 0.9 % IJ SOLN: 3.0000 mL | INTRAMUSCULAR | Status: DC | PRN

## 2014-05-11 MED ORDER — LISINOPRIL 5 MG PO TABS
5.0000 mg | ORAL_TABLET | Freq: Every day | ORAL | Status: DC
  Administered 2014-05-11: 5 mg via ORAL
  Filled 2014-05-11: qty 1

## 2014-05-11 MED ORDER — ASPIRIN EC 81 MG PO TBEC
81.0000 mg | DELAYED_RELEASE_TABLET | Freq: Every day | ORAL | Status: DC
Start: 2014-05-12 — End: 2014-05-12
  Administered 2014-05-12: 81 mg via ORAL
  Filled 2014-05-11: qty 1

## 2014-05-11 MED ORDER — IOHEXOL 350 MG/ML SOLN
80.0000 mL | Freq: Once | INTRAVENOUS | Status: AC | PRN
  Administered 2014-05-11: 80 mL via INTRAVENOUS

## 2014-05-11 MED ORDER — CLOPIDOGREL BISULFATE 75 MG PO TABS
75.0000 mg | ORAL_TABLET | Freq: Every day | ORAL | Status: DC
  Administered 2014-05-11 – 2014-05-12 (×2): 75 mg via ORAL
  Filled 2014-05-11 (×2): qty 1

## 2014-05-11 MED ORDER — NEBIVOLOL HCL 5 MG PO TABS
5.0000 mg | ORAL_TABLET | Freq: Two times a day (BID) | ORAL | Status: DC
  Administered 2014-05-11 – 2014-05-12 (×3): 5 mg via ORAL
  Filled 2014-05-11 (×5): qty 1

## 2014-05-11 MED ORDER — SODIUM CHLORIDE 0.9 % IJ SOLN
3.0000 mL | Freq: Two times a day (BID) | INTRAMUSCULAR | Status: DC
Start: 2014-05-11 — End: 2014-05-12
  Administered 2014-05-11 (×3): 3 mL via INTRAVENOUS

## 2014-05-11 MED ORDER — ASPIRIN 300 MG RE SUPP: 300.0000 mg | Freq: Every day | RECTAL | Status: DC

## 2014-05-11 MED ORDER — ENOXAPARIN SODIUM 40 MG/0.4ML ~~LOC~~ SOLN
40.0000 mg | SUBCUTANEOUS | Status: DC
  Administered 2014-05-11 – 2014-05-12 (×2): 40 mg via SUBCUTANEOUS
  Filled 2014-05-11 (×2): qty 0.4

## 2014-05-11 MED ORDER — ATORVASTATIN CALCIUM 80 MG PO TABS
80.0000 mg | ORAL_TABLET | Freq: Every day | ORAL | Status: DC
  Administered 2014-05-11: 80 mg via ORAL
  Filled 2014-05-11: qty 1

## 2014-05-11 MED ORDER — ASPIRIN 325 MG PO TABS
325.0000 mg | ORAL_TABLET | Freq: Every day | ORAL | Status: DC
  Administered 2014-05-11: 325 mg via ORAL
  Filled 2014-05-11: qty 1

## 2014-05-11 NOTE — Progress Notes (Signed)
UR completed 

## 2014-05-11 NOTE — Progress Notes (Signed)
[facility] PROGRESS NOTE  Matthew Luna GOT:[id number] DOB: May 21, 1940 DOA: 05/10/2014 PCP: Odette Fraction, MD  Brief Summary  74 yo male with blurred vision and right arm numbness earlier today at home, called 911. Resolved soon after arrival to ED, code stroke was called and pt was evaluated immediately by neuro team. Symptoms had resolved by time of hospitalist exam.  No prior h/o cva/tia and had not been taking daily aspirin prior to admission.  Assessment/Plan  TIA -  MRI brain:  No acute process, mild to moderate white matter changes consistent with chronic small vessel disease -  MRA brain:  High-grade stenosis versus occlusion of the proximal right P1 segment, mid to high-grade stenosis of the left proximal P2 segment, atherosclerosis and stenosis of bilateral superior cerebellar arteries with a possible occlusion of the left. Mild stenosis of the right supraclinoid internal carotid artery -  Consider CTA neck (deferred to neurology) -  Carotid duplex:  pending -  ECHO:  pending -  Tele:  Sinus bradycardia -  PT/OT pending -  Passed swallow evaluation -  Continue ASA for secondary prevention.  We will add Plavix due to high-grade stenosis -  Start high-dose statin -  Hemoccult A1c, lipid panel as below  Essential hypertension, blood pressure mildly elevated.  Has been on numerous blood pressure medications with side effects.  Has been tolerating this beta blocker, however -  Continue beta blocker -  Start lisinopril and carefully monitor creatinine  Dyslipidemia/hyperlipidemia -  Start high-dose statin  Chronic kidney disease stage 3 -  Trend creatinine -  Minimize nephrotoxins and renally dose medications  Sleep apnea, is supposed to wear cpap machine but wears breath right strips instead -  Advised him to have repeat sleep study with his breath-right strips on  Ulcerative colitis, AAA, and esophageal stricture, stable  Diet:  Healthy heart Access:   PIV IVF:  Off Proph:  Lovenox  Code Status: Full Family Communication: patient alone Disposition Plan: pending further evaluation for tia   Consultants:  Neurology  Procedures:  CT head  MRI brain  MRA brain  Echo  Carotid duplex  Antibiotics:  None   HPI/Subjective:  Symptoms have resolved.  He feels back to baseline  Objective: Filed Vitals:   05/11/14 0022 05/11/14 0220 05/11/14 0420 05/11/14 0630  BP: 157/90 155/82 156/79 159/109  Pulse: 89     Temp: 98.1 F (36.7 C) 98.2 F (36.8 C) 98 F (36.7 C) 98.1 F (36.7 C)  TempSrc: Oral Oral Oral Oral  Resp: 19 19 19 18   Height:      Weight: 104.1 kg (229 lb 8 oz)     SpO2: 94% 96% 97% 96%   No intake or output data in the 24 hours ending 05/11/14 0844 Filed Weights   05/10/14 2142 05/11/14 0022  Weight: 106.8 kg (235 lb 7.2 oz) 104.1 kg (229 lb 8 oz)    Exam:   General:  Obese male, No acute distress  HEENT:  NCAT, MMM  Cardiovascular:  RRR, nl S1, S2 no mrg, 2+ pulses, warm extremities  Respiratory:  CTAB, no increased WOB  Abdomen:   NABS, soft, NT/ND  MSK:   Normal tone and bulk, no LEE  Neuro:  CN II-XII grossly intact, strength 5/5, sensation intact to light touch throughout, no dysmetria arms/legs.  Data Reviewed: Basic Metabolic Panel:  Recent Labs Lab 05/10/14 2125 05/10/14 2132  NA 139 142  K 3.8 3.7  CL 106 106  CO2 24  --  GLUCOSE 104* 103*  BUN 22 25*  CREATININE 1.40* 1.30  CALCIUM 8.8  --    Liver Function Tests:  Recent Labs Lab 05/10/14 2125  AST 21  ALT 12  ALKPHOS 83  BILITOT 0.6  PROT 6.2  ALBUMIN 3.3*   No results for input(s): LIPASE, AMYLASE in the last 168 hours. No results for input(s): AMMONIA in the last 168 hours. CBC:  Recent Labs Lab 05/10/14 2125 05/10/14 2132  WBC 10.8*  --   NEUTROABS 6.2  --   HGB 14.2 15.3  HCT 42.3 45.0  MCV 96.1  --   PLT 213  --    Cardiac Enzymes: No results for input(s): CKTOTAL, CKMB, CKMBINDEX,  TROPONINI in the last 168 hours. BNP (last 3 results) No results for input(s): BNP in the last 8760 hours.  ProBNP (last 3 results) No results for input(s): PROBNP in the last 8760 hours.  CBG:  Recent Labs Lab 05/10/14 2340  GLUCAP 90    No results found for this or any previous visit (from the past 240 hour(s)).   Studies: Ct Head (brain) Wo Contrast  05/10/2014   CLINICAL DATA:  Code stroke.  Right-sided weakness.  EXAM: CT HEAD WITHOUT CONTRAST  TECHNIQUE: Contiguous axial images were obtained from the base of the skull through the vertex without intravenous contrast.  COMPARISON:  CT head without contrast 02/02/2013  FINDINGS: The basal ganglia and insular cortex is intact bilaterally. No acute cortical infarct, hemorrhage, or mass lesion is present. Mild generalized atrophy is within normal limits for age.  Vascular calcifications are present in the dural scratch the vascular calcifications are present in the vertebral arteries at the dural margin. There are vascular calcifications in the cavernous internal carotid arteries as well. The paranasal sinuses and mastoid air cells are clear. The calvarium is intact. A remote right frontal sinus fracture is again seen. The globes and orbits are within normal limits.  ASPECTS score = 10/10  [place] Stroke Program Early CT Score  Normal score = 10  IMPRESSION: 1. No acute intracranial abnormality. 2. Mild atrophy is within normal limits for age. 3. Remote right frontal sinus fracture.   Electronically Signed   By: San Morelle M.D.   On: 05/10/2014 21:41   Mr Brain Wo Contrast  05/11/2014   CLINICAL DATA:  Blurry vision and RIGHT arm numbness beginning earlier today, symptoms resolved after arrival in the ER department. History of prostate cancer, hypertension, dizziness.  EXAM: MRI HEAD WITHOUT CONTRAST  MRA HEAD WITHOUT CONTRAST  TECHNIQUE: Multiplanar, multiecho pulse sequences of the brain and surrounding structures were obtained  without intravenous contrast. Angiographic images of the head were obtained using MRA technique without contrast.  COMPARISON:  CT of the head May 10, 2014 and MRI of the brain Jun 10, 2013 and MRA of the head December 23, 2007  FINDINGS: MRI HEAD FINDINGS  The ventricles and sulci are normal for patient's age. No abnormal parenchymal signal, mass lesions, mass effect. No reduced diffusion to suggest acute ischemia. No susceptibility artifact to suggest hemorrhage. Patchy supratentorial white matter T2 hyperintensities are unchanged, within normal range for patient's age.  No abnormal extra-axial fluid collections. No extra-axial masses though, contrast enhanced sequences would be more sensitive. Normal major intracranial vascular flow voids seen at the skull base.  Ocular globes and orbital contents are unremarkable though not tailored for evaluation. No abnormal sellar expansion. Trace ethmoid mucosal thickening without paranasal sinus air-fluid levels. Trace bilateral mastoid effusions. No suspicious  calvarial bone marrow signal. No abnormal sellar expansion. Craniocervical junction maintained.  MRA HEAD FINDINGS  Anterior circulation: Normal flow related enhancement of the included cervical, petrous, cavernous and supra clinoid internal carotid arteries. Mild narrowing of the RIGHT supraclinoid internal carotid artery. Patent anterior communicating artery. Normal flow related enhancement of the anterior and middle cerebral arteries, including more distal segments.  No large vessel occlusion, high-grade stenosis, abnormal luminal irregularity, aneurysm.  Posterior circulation: LEFT vertebral artery is dominant. Basilar artery is patent, moderate luminal irregularity of the bilateral superior cerebellar arteries of possible mid to distal occlusion on the LEFT. High-grade stenosis RIGHT proximal of P1 segment with loss of distal flow related enhancement. Mid to high-grade stenosis LEFT proximal P2 segment.  No  abnormal luminal irregularity, aneurysm.  IMPRESSION: MRI HEAD: No acute intracranial process.  Involutional changes. Mild to moderate white matter changes suggest chronic small vessel ischemic disease.  MRA HEAD: High-grade stenosis versus occlusion of proximal RIGHT P1 segment, mid to high-grade stenosis LEFT proximal P2 segment.  Atherosclerosis/stenosis of bilateral superior cerebellar arteries, possible occlusion on the LEFT.  Mild stenosis RIGHT supraclinoid internal carotid artery.  Constellation of findings would be better characterized on CTA of the neck as clinically indicated.   Electronically Signed   By: Elon Alas   On: 05/11/2014 03:15   Mr Jodene Nam Head/brain Wo Cm  05/11/2014   CLINICAL DATA:  Blurry vision and RIGHT arm numbness beginning earlier today, symptoms resolved after arrival in the ER department. History of prostate cancer, hypertension, dizziness.  EXAM: MRI HEAD WITHOUT CONTRAST  MRA HEAD WITHOUT CONTRAST  TECHNIQUE: Multiplanar, multiecho pulse sequences of the brain and surrounding structures were obtained without intravenous contrast. Angiographic images of the head were obtained using MRA technique without contrast.  COMPARISON:  CT of the head May 10, 2014 and MRI of the brain Jun 10, 2013 and MRA of the head December 23, 2007  FINDINGS: MRI HEAD FINDINGS  The ventricles and sulci are normal for patient's age. No abnormal parenchymal signal, mass lesions, mass effect. No reduced diffusion to suggest acute ischemia. No susceptibility artifact to suggest hemorrhage. Patchy supratentorial white matter T2 hyperintensities are unchanged, within normal range for patient's age.  No abnormal extra-axial fluid collections. No extra-axial masses though, contrast enhanced sequences would be more sensitive. Normal major intracranial vascular flow voids seen at the skull base.  Ocular globes and orbital contents are unremarkable though not tailored for evaluation. No abnormal sellar  expansion. Trace ethmoid mucosal thickening without paranasal sinus air-fluid levels. Trace bilateral mastoid effusions. No suspicious calvarial bone marrow signal. No abnormal sellar expansion. Craniocervical junction maintained.  MRA HEAD FINDINGS  Anterior circulation: Normal flow related enhancement of the included cervical, petrous, cavernous and supra clinoid internal carotid arteries. Mild narrowing of the RIGHT supraclinoid internal carotid artery. Patent anterior communicating artery. Normal flow related enhancement of the anterior and middle cerebral arteries, including more distal segments.  No large vessel occlusion, high-grade stenosis, abnormal luminal irregularity, aneurysm.  Posterior circulation: LEFT vertebral artery is dominant. Basilar artery is patent, moderate luminal irregularity of the bilateral superior cerebellar arteries of possible mid to distal occlusion on the LEFT. High-grade stenosis RIGHT proximal of P1 segment with loss of distal flow related enhancement. Mid to high-grade stenosis LEFT proximal P2 segment.  No abnormal luminal irregularity, aneurysm.  IMPRESSION: MRI HEAD: No acute intracranial process.  Involutional changes. Mild to moderate white matter changes suggest chronic small vessel ischemic disease.  MRA HEAD: High-grade stenosis versus  occlusion of proximal RIGHT P1 segment, mid to high-grade stenosis LEFT proximal P2 segment.  Atherosclerosis/stenosis of bilateral superior cerebellar arteries, possible occlusion on the LEFT.  Mild stenosis RIGHT supraclinoid internal carotid artery.  Constellation of findings would be better characterized on CTA of the neck as clinically indicated.   Electronically Signed   By: Elon Alas   On: 05/11/2014 03:15    Scheduled Meds: . aspirin  300 mg Rectal Daily   Or  . aspirin  325 mg Oral Daily  . sodium chloride  3 mL Intravenous Q12H   Continuous Infusions:   Principal Problem:   TIA (transient ischemic  attack) Active Problems:   Essential hypertension   AAA (abdominal aortic aneurysm) without rupture   Sleep apnea   Diplopia    Time spent: 30 min    Charnika Herbst, [facility] Pager [phone]. If 7PM-7AM, please contact night-coverage at [email or web address], password [email or web address] 05/11/2014, 8:44 AM

## 2014-05-11 NOTE — Progress Notes (Signed)
ANTICOAGULATION CONSULT NOTE - Initial Consult  Pharmacy Consult for LMWH Indication: VTE prophylaxis  Allergies  Allergen Reactions  . Metronidazole Anaphylaxis  . Amoxicillin-Pot Clavulanate Other (See Comments)    UNKNOWN  . Ciprofloxacin Other (See Comments)    UNKNOWN  . Mesalamine     REACTION: Intolerance    Patient Measurements: Height: 6\' 3"  (190.5 cm) Weight: 229 lb 8 oz (104.1 kg) IBW/kg (Calculated) : 84.5   Vital Signs: Temp: 98.1 F (36.7 C) (04/11 0630) Temp Source: Oral (04/11 0630) BP: 159/109 mmHg (04/11 0630) Pulse Rate: 89 (04/11 0022)  Labs:  Recent Labs  05/10/14 2125 05/10/14 2132  HGB 14.2 15.3  HCT 42.3 45.0  PLT 213  --   APTT 29  --   LABPROT 13.0  --   INR 0.97  --   CREATININE 1.40* 1.30    Estimated Creatinine Clearance: 67.1 mL/min (by C-G formula based on Cr of 1.3).   Medical History: Past Medical History  Diagnosis Date  . Mononeuritis of unspecified site   . Actinic keratosis   . Nonspecific abnormal results of thyroid function study   . Hypertrophy of prostate without urinary obstruction and other lower urinary tract symptoms (LUTS)   . Unspecified essential hypertension   . Stricture and stenosis of esophagus   . Left sided ulcerative (chronic) colitis   . Duodenitis without mention of hemorrhage   . Esophageal reflux   . Diverticulosis of colon (without mention of hemorrhage)   . Prostate cancer     cryotherapy 2011 [facility])  . Sleep apnea     ahi 75, cpap 13     Medications:  Prescriptions prior to admission  Medication Sig Dispense Refill Last Dose  . citalopram (CELEXA) 10 MG tablet Take 5 mg by mouth daily.   05/10/2014 at Unknown time  . fluticasone (FLONASE) 50 MCG/ACT nasal spray USE 2 SPRAYS EVERY DAY AS DIRECTED (Patient taking differently: USE 1 SPRAY each nostril daily as needed.) 16 g 11 05/10/2014 at Unknown time  . Misc. Throat Products (BREATHE RIGHT SNORE RELIEF MT) Use as directed 1 strip in the  mouth or throat at bedtime.   05/09/2014 at Unknown time  . nebivolol (BYSTOLIC) 10 MG tablet Take 1 tablet (10 mg total) by mouth daily. (Patient taking differently: Take 5 mg by mouth 2 (two) times daily. ) 30 tablet 5 05/10/2014 at 0800  . predniSONE (DELTASONE) 20 MG tablet 3 tabs po qd x 2 days, 2 tabs po qd x 2 days, 1 tab po qd x 2 days (Patient not taking: Reported on 05/10/2014) 12 tablet 0 Not Taking at Unknown time    Assessment: 74 yo M admitted for TIA workup.  Pharmacy consulted to dose LMWH for VTE px. Wt 104.1, BMI 29, creat 1.3, creat cl > 30 ml/min. CBC wnl.     Plan:  LMWH 40 q24 Pharmacy will sign off. Thanks  Eudelia Bunch, Pharm.D. [phone] 05/11/2014 9:37 AM

## 2014-05-11 NOTE — Progress Notes (Signed)
Patient transported to MRI 

## 2014-05-11 NOTE — Progress Notes (Signed)
  Echocardiogram 2D Echocardiogram has been performed.  Donata Clay 05/11/2014, 2:17 PM

## 2014-05-11 NOTE — Evaluation (Signed)
Physical Therapy Evaluation Patient Details Name: Matthew Luna MRN: [id number] DOB: 03-Nov-1940 Today's Date: 05/11/2014   History of Present Illness  Patient is a 74 y/o male admitted with RUE nunbness and blurred vision, resolved in ED. MRA brain: High-grade stenosis versus occlusion of the proximal right P1 segment, mid to high-grade stenosis of the left proximal P2 segment, atherosclerosis and stenosis of bilateral superior cerebellar arteries with a possible occlusion of the left.  Workup pending.   Clinical Impression  Patient presents close to functional baseline (per pt report) however exhibits impulsivity during mobility assessment impacting balance. Reports some blurry vision (however states some of this is baseline) so difficult to determine what is new. Would benefit from higher level balance challenges and activities while in hospital to improve safety during mobility so pt can maximize independence and decrease fall risk.    Follow Up Recommendations No PT follow up;Supervision - Intermittent    Equipment Recommendations  None recommended by PT    Recommendations for Other Services       Precautions / Restrictions Precautions Precautions: Fall Restrictions Weight Bearing Restrictions: No      Mobility  Bed Mobility Overal bed mobility: Modified Independent                Transfers Overall transfer level: Modified independent               General transfer comment: Impulsive upon standing.  Ambulation/Gait Ambulation/Gait assistance: Supervision Ambulation Distance (Feet): 510 Feet Assistive device: None Gait Pattern/deviations: Step-through pattern;Drifts right/left Gait velocity: increased Gait velocity interpretation: at or above normal speed for age/gender General Gait Details: Pt with impulsive, fast gait speed. Mild deviations noted during head turns, changes in direction. No overt LOB. HR increased to 127 bpm.  Stairs             Wheelchair Mobility    Modified Rankin (Stroke Patients Only) Modified Rankin (Stroke Patients Only) Pre-Morbid Rankin Score: No symptoms Modified Rankin: No significant disability     Balance Overall balance assessment: Needs assistance Sitting-balance support: Feet supported;No upper extremity supported Sitting balance-Leahy Scale: Good     Standing balance support: During functional activity Standing balance-Leahy Scale: Fair               High level balance activites: Direction changes;Turns;Sudden stops;Head turns High Level Balance Comments: Tolerated above balance challenges with only minor deviations in gait and some veering but no overt LOB.             Pertinent Vitals/Pain Pain Assessment: No/denies pain    Home Living Family/patient expects to be discharged to:: Private residence Living Arrangements: Spouse/significant other Available Help at Discharge: Family Type of Home: House Home Access: Level entry     Home Layout: One level Home Equipment: None      Prior Function Level of Independence: Independent         Comments: Used to work for [employer] in [place].     Hand Dominance        Extremity/Trunk Assessment   Upper Extremity Assessment: Defer to OT evaluation           Lower Extremity Assessment: Overall WFL for tasks assessed         Communication   Communication: No difficulties  Cognition Arousal/Alertness: Awake/alert Behavior During Therapy: Impulsive Overall Cognitive Status: Within Functional Limits for tasks assessed  General Comments General comments (skin integrity, edema, etc.): Pt reports blurry vision at times (states 1/10 blurry vision?) however difficult to determine if this is new as pt reports wearing reading glasses at baseline and does not have them here. Not able to read name tag with 1 eye (R or L) when other is occluded. Able to read some letters using both eyes  but still endorses blurriness. Needs further assessment.    Exercises        Assessment/Plan    PT Assessment Patient needs continued PT services  PT Diagnosis Difficulty walking   PT Problem List Decreased balance;Decreased safety awareness;Decreased mobility  PT Treatment Interventions Balance training;Gait training;Stair training;Functional mobility training;Therapeutic activities;Therapeutic exercise;Patient/family education;Neuromuscular re-education   PT Goals (Current goals can be found in the Care Plan section) Acute Rehab PT Goals Patient Stated Goal: to return home PT Goal Formulation: With patient Time For Goal Achievement: 05/25/14 Potential to Achieve Goals: Good    Frequency Min 4X/week   Barriers to discharge        Co-evaluation               End of Session Equipment Utilized During Treatment: Gait belt Activity Tolerance: Patient tolerated treatment well Patient left: in bed;with call bell/phone within reach;with bed alarm set Nurse Communication: Mobility status    Functional Assessment Tool Used: Clinical judgment Functional Limitation: Mobility: Walking and moving around Mobility: Walking and Moving Around Current Status (E3154): At least 1 percent but less than 20 percent impaired, limited or restricted Mobility: Walking and Moving Around Goal Status [phone]): At least 1 percent but less than 20 percent impaired, limited or restricted    Time: 0949-1010 PT Time Calculation (min) (ACUTE ONLY): 21 min   Charges:   PT Evaluation $Initial PT Evaluation Tier I: 1 Procedure     PT G Codes:   PT G-Codes **NOT FOR INPATIENT CLASS** Functional Assessment Tool Used: Clinical judgment Functional Limitation: Mobility: Walking and moving around Mobility: Walking and Moving Around Current Status (Y1950): At least 1 percent but less than 20 percent impaired, limited or restricted Mobility: Walking and Moving Around Goal Status [phone]): At least 1  percent but less than 20 percent impaired, limited or restricted    Matthew Luna A 05/11/2014, 10:22 AM Matthew Luna, PT, DPT [phone]

## 2014-05-11 NOTE — Progress Notes (Signed)
STROKE TEAM PROGRESS NOTE   HISTORY Matthew Luna is an 74 y.o. male hx of HTN, prostate CA presenting with acute onset of dizziness, blurred vision and right sided weakness. LSW at 1500 05/10/2014 when he developed dizziness. A few hours later noted blurred/double vision and right sided weakness. Upon EMS arrival, BP noted to be 160/100, CBG 87. Upon arrival to ED weakness had resolved, notes continued mild dizziness. No prior CVA or TIA history. Has MRA head from 2009 showing bilateral vertebral stenosis R>L. Does not take a daily antiplatelet agent. Patient was not administered TPA secondary to outside tPA window. Initial NIHSS of 0. He was admitted for further evaluation and treatment.   SUBJECTIVE (INTERVAL HISTORY) No family is at the bedside.  Overall he feels his condition is stable, neuro back to baseline. MRI negative for stroke.    OBJECTIVE Temp:  [97.5 F (36.4 C)-98.6 F (37 C)] 98.6 F (37 C) (04/11 0951) Pulse Rate:  [51-89] 60 (04/11 0951) Cardiac Rhythm:  [-] Sinus bradycardia (04/11 0022) Resp:  [12-22] 18 (04/11 0951) BP: (110-183)/(73-109) 110/94 mmHg (04/11 0951) SpO2:  [92 %-100 %] 100 % (04/11 0951) Weight:  [104.1 kg (229 lb 8 oz)-106.8 kg (235 lb 7.2 oz)] 104.1 kg (229 lb 8 oz) (04/11 0022)   Recent Labs Lab 05/10/14 2340  GLUCAP 90    Recent Labs Lab 05/10/14 2125 05/10/14 2132  NA 139 142  K 3.8 3.7  CL 106 106  CO2 24  --   GLUCOSE 104* 103*  BUN 22 25*  CREATININE 1.40* 1.30  CALCIUM 8.8  --     Recent Labs Lab 05/10/14 2125  AST 21  ALT 12  ALKPHOS 83  BILITOT 0.6  PROT 6.2  ALBUMIN 3.3*    Recent Labs Lab 05/10/14 2125 05/10/14 2132  WBC 10.8*  --   NEUTROABS 6.2  --   HGB 14.2 15.3  HCT 42.3 45.0  MCV 96.1  --   PLT 213  --    No results for input(s): CKTOTAL, CKMB, CKMBINDEX, TROPONINI in the last 168 hours.  Recent Labs  05/10/14 2125  LABPROT 13.0  INR 0.97   No results for input(s): COLORURINE, LABSPEC,  PHURINE, GLUCOSEU, HGBUR, BILIRUBINUR, KETONESUR, PROTEINUR, UROBILINOGEN, NITRITE, LEUKOCYTESUR in the last 72 hours.  Invalid input(s): APPERANCEUR     Component Value Date/Time   CHOL 222* 05/11/2014 0655   TRIG 91 05/11/2014 0655   HDL 34* 05/11/2014 0655   CHOLHDL 6.5 05/11/2014 0655   VLDL 18 05/11/2014 0655   LDLCALC 170* 05/11/2014 0655   No results found for: HGBA1C No results found for: LABOPIA, COCAINSCRNUR, LABBENZ, AMPHETMU, THCU, LABBARB  No results for input(s): ETH in the last 168 hours.   I have personally reviewed the radiological images below and agree with the radiology interpretations.  Ct Head (brain) Wo Contrast 05/10/2014    1. No acute intracranial abnormality. 2. Mild atrophy is within normal limits for age. 3. Remote right frontal sinus fracture.     MRI HEAD 05/11/2014    No acute intracranial process.  Involutional changes. Mild to moderate white matter changes suggest chronic small vessel ischemic disease.    MRA HEAD   05/11/2014    High-grade stenosis versus occlusion of proximal RIGHT P1 segment, mid to high-grade stenosis LEFT proximal P2 segment.  Atherosclerosis/stenosis of bilateral superior cerebellar arteries, possible occlusion on the LEFT.  Mild stenosis RIGHT supraclinoid internal carotid artery.  Constellation of findings would be better characterized  on CTA of the neck as clinically indicated.     CTA Neck 05/11/2014 No extracranial carotid artery flow reducing lesion is evident. Minor atheromatous change at the bifurcations. High-grade flow-limiting stenosis LEFT vertebral origin. This is similar to 2009.  2D echo - Left ventricle: The cavity size was normal. Systolic function was normal. The estimated ejection fraction was in the range of 55% to 60%. Wall motion was normal; there were no regional wall motion abnormalities. Doppler parameters are consistent with abnormal left ventricular relaxation (grade 1  diastolic dysfunction). - Aortic valve: There was trivial regurgitation.  PHYSICAL EXAM  Temp:  [97.5 F (36.4 C)-98.6 F (37 C)] 97.9 F (36.6 C) (04/11 1747) Pulse Rate:  [51-89] 64 (04/11 1747) Resp:  [12-22] 18 (04/11 1747) BP: (103-183)/(71-109) 103/71 mmHg (04/11 1747) SpO2:  [92 %-100 %] 96 % (04/11 1747) Weight:  [229 lb 8 oz (104.1 kg)-235 lb 7.2 oz (106.8 kg)] 229 lb 8 oz (104.1 kg) (04/11 0022)  General - Well nourished, well developed, in no apparent distress.  Ophthalmologic - Sharp disc margins OU.  Cardiovascular - Regular rate and rhythm with no murmur.  Mental Status -  Level of arousal and orientation to time, place, and person were intact. Language including expression, naming, repetition, comprehension was assessed and found intact.  Cranial Nerves II - XII - II - Visual field intact OU. III, IV, VI - Extraocular movements intact. V - Facial sensation intact bilaterally. VII - Facial movement intact bilaterally. VIII - Hearing & vestibular intact bilaterally. X - Palate elevates symmetrically. XI - Chin turning & shoulder shrug intact bilaterally. XII - Tongue protrusion intact.  Motor Strength - The patient's strength was normal in all extremities and pronator drift was absent.  Bulk was normal and fasciculations were absent.   Motor Tone - Muscle tone was assessed at the neck and appendages and was normal.  Reflexes - The patient's reflexes were 1+ in all extremities and he had no pathological reflexes.  Sensory - Light touch, temperature/pinprick, vibration and proprioception, and Romberg testing were assessed and were symmetrical.    Coordination - The patient had normal movements in the hands and feet with no ataxia or dysmetria.  Tremor was absent.  Gait and Station - The patient's transfers, posture, gait, station, and turns were observed as normal.   ASSESSMENT/PLAN [name] is a 74 y.o. male with history of HTN, prostate CA   presenting with acute onset of dizziness, blurred vision and right sided weakness. He did not receive IV t-PA due to delay in arrival, NIHSS=0.   Posterior circulation TIA   Resultant  Neuro deficits resolved  MRI  No acute stroke  MRA  Severe Posterior circulation atherosclerosis w/ high grade occlusion R P1, mid-high stenosis L P2, ? Occlusion C SCA significantly progressed since 2009   CTA no flow limitations in carotid; L VA w/ high grade stenosis similar to 2009   2D Echo  unremarkable   LDL 170, not at goal  HgbA1c pending  Lovenox 40 mg sq daily for VTE prophylaxis Diet Heart Room service appropriate?: Yes; Fluid consistency:: Thin  no antithrombotic prior to admission, now on aspirin 81 mg orally every day and clopidogrel 75 mg orally every day. Given large vessel intracranial atherosclerosis, patient should be treated with aspirin 81 mg and clopidogrel 75 mg orally every day x 3 months for secondary stroke prevention. After 3 months, change to plavix alone. Long-term dual antiplatelets are contraindicated due to risk  for intracerebral hemorrhage.  Patient counseled to be compliant with his antithrombotic medications  Ongoing aggressive stroke risk factor management  Therapy recommendations:  No therapy needs  Disposition:  Return home  Follow up with Dr. Erlinda Hong, Stroke clinic 2 months (order written)  Essential Hypertension  Home meds:   bysystolic  Stable  Avoid hypotension  BP goal 120-150  Hyperlipidemia  Home meds:  No staitn  LDL 170 goal < 70  New lipitor 80  Continue statin at discharge  Other Stroke Risk Factors  Advanced age  ETOH use  Obstructive sleep apnea, on CPAP at home  Weight loss and exercise recommended by Dr. Erlinda Hong  Other Active Problems  AAA w/o rupture  Hospital day #   [place] for Pager information 05/11/2014 2:10 PM   I, the attending vascular neurologist, have personally  obtained a history, examined the patient, evaluated laboratory data, individually viewed imaging studies and agree with radiology interpretations. I also discussed with Dr. Sheran Fava regarding his care plan. Together with the NP/PA, we formulated the assessment and plan of care which reflects our mutual decision.  I have made any additions or clarifications directly to the above note and agree with the findings and plan as currently documented.   75 yo M with Hx of HTN was admitted for dizziness and double vision as well as right sided weakness. Now resolved. MRI no acute infarct but MRA showed PCA and SCA high grade stenosis. Comparing with 2009 MRA, posterior circulation stenosis has much progressed. Recommend dural antiplatelet for 3 months and then monotherapy. Also BP goal 120-150 and avoid hypotension. LDL high and put on high dose lipitor. Pt will need aggressive risk factor control and follow up in clinic in 2 months.  Neurology will sign off. Please call with questions. Pt will follow up with Dr. Erlinda Hong at [facility] in about 2 months. Thanks for the consult.  Rosalin Hawking, MD PhD Stroke Neurology 05/11/2014 9:32 PM        To contact Stroke Continuity provider, please refer to [email or web address] After hours, contact General Neurology

## 2014-05-11 NOTE — Progress Notes (Signed)
Patient arrived from [facility] ED at [place], alert and oriented. Patient oriented to room and equipment. Cardiac monitoring initiated. No c/o pain, blurred vision or dizziness at this moment. Denies right sided weakness. MAEW. VSS. Will monitor closely overnight.

## 2014-05-11 NOTE — Progress Notes (Signed)
OT Cancellation Note  Patient Details Name: Matthew Luna MRN: [id number] DOB: [birth year]   Cancelled Treatment:    Reason Eval/Treat Not Completed: Patient at procedure or test/ unavailable (CT @ 8:53A)  Peri Maris  Pager: [phone]  05/11/2014, 8:53 AM

## 2014-05-12 DIAGNOSIS — N183 Chronic kidney disease, stage 3 unspecified: Secondary | ICD-10-CM

## 2014-05-12 DIAGNOSIS — I714 Abdominal aortic aneurysm, without rupture: Secondary | ICD-10-CM | POA: Diagnosis not present

## 2014-05-12 DIAGNOSIS — I672 Cerebral atherosclerosis: Secondary | ICD-10-CM

## 2014-05-12 DIAGNOSIS — G459 Transient cerebral ischemic attack, unspecified: Secondary | ICD-10-CM | POA: Diagnosis not present

## 2014-05-12 LAB — BASIC METABOLIC PANEL
Anion gap: 6 (ref 5–15)
BUN: 22 mg/dL (ref 6–23)
CO2: 29 mmol/L (ref 19–32)
Calcium: 9 mg/dL (ref 8.4–10.5)
Chloride: 106 mmol/L (ref 96–112)
Creatinine, Ser: 1.6 mg/dL — ABNORMAL HIGH (ref 0.50–1.35)
GFR calc Af Amer: 48 mL/min — ABNORMAL LOW (ref >90)
GFR calc non Af Amer: 41 mL/min — ABNORMAL LOW (ref >90)
Glucose, Bld: 91 mg/dL (ref 70–99)
Potassium: 4.1 mmol/L (ref 3.5–5.1)
Sodium: 141 mmol/L (ref 135–145)

## 2014-05-12 LAB — CBC
HCT: 44.1 % (ref 39.0–52.0)
Hemoglobin: 14.6 g/dL (ref 13.0–17.0)
MCH: 32 pg (ref 26.0–34.0)
MCHC: 33.1 g/dL (ref 30.0–36.0)
MCV: 96.7 fL (ref 78.0–100.0)
Platelets: 227 10*3/uL (ref 150–400)
RBC: 4.56 MIL/uL (ref 4.22–5.81)
RDW: 14.3 % (ref 11.5–15.5)
WBC: 8.4 10*3/uL (ref 4.0–10.5)

## 2014-05-12 LAB — HEMOGLOBIN A1C
Hgb A1c MFr Bld: 6 % — ABNORMAL HIGH (ref 4.8–5.6)
Mean Plasma Glucose: 126 mg/dL

## 2014-05-12 MED ORDER — NEBIVOLOL HCL 10 MG PO TABS: 5.0000 mg | ORAL_TABLET | Freq: Two times a day (BID) | ORAL | Status: DC

## 2014-05-12 MED ORDER — ASPIRIN 81 MG PO TBEC: 81.0000 mg | DELAYED_RELEASE_TABLET | Freq: Every day | ORAL | Status: DC

## 2014-05-12 MED ORDER — CLOPIDOGREL BISULFATE 75 MG PO TABS: 75.0000 mg | ORAL_TABLET | Freq: Every day | ORAL | Status: DC

## 2014-05-12 MED ORDER — ATORVASTATIN CALCIUM 80 MG PO TABS: 80.0000 mg | ORAL_TABLET | Freq: Every day | ORAL | Status: DC

## 2014-05-12 MED ORDER — FLUTICASONE PROPIONATE 50 MCG/ACT NA SUSP: NASAL | Status: DC

## 2014-05-12 NOTE — Evaluation (Signed)
Speech Language Pathology Evaluation Patient Details Name: Matthew Luna MRN: [id number] DOB: February 03, 1940 Today's Date: 05/12/2014 Time: 3646-8032 SLP Time Calculation (min) (ACUTE ONLY): 12 min  Problem List:  Patient Active Problem List   Diagnosis Date Noted  . TIA (transient ischemic attack) 05/10/2014  . Diplopia 05/10/2014  . Dizziness   . Sleep apnea   . AAA (abdominal aortic aneurysm) without rupture 02/07/2013  . Prostate cancer   . ABDOMINAL PAIN, EPIGASTRIC 08/25/2009  . NEUROPATHY 05/02/2007  . ACTINIC KERATOSIS 03/22/2007  . THYROID FUNCTION TEST, ABNORMAL 03/22/2007  . Essential hypertension 03/01/2007  . BENIGN PROSTATIC HYPERTROPHY 03/01/2007  . ESOPHAGEAL STRICTURE 02/23/2007  . RASH-NONVESICULAR 01/08/2007  . COLITIS, LEFT-SIDED ULCERATIVE 09/18/2006  . DUODENITIS W/O HEMORRHAGE 03/13/2006   Past Medical History:  Past Medical History  Diagnosis Date  . Mononeuritis of unspecified site   . Actinic keratosis   . Nonspecific abnormal results of thyroid function study   . Hypertrophy of prostate without urinary obstruction and other lower urinary tract symptoms (LUTS)   . Unspecified essential hypertension   . Stricture and stenosis of esophagus   . Left sided ulcerative (chronic) colitis   . Duodenitis without mention of hemorrhage   . Esophageal reflux   . Diverticulosis of colon (without mention of hemorrhage)   . Prostate cancer     cryotherapy 2011 [facility])  . Sleep apnea     ahi 75, cpap 13    Past Surgical History:  Past Surgical History  Procedure Laterality Date  . Cholecystectomy    . Prostate surgery    . Esophagogastroduodenoscopy  03/22/2011    Procedure: ESOPHAGOGASTRODUODENOSCOPY (EGD);  Surgeon: Inda Castle, MD;  Location: [facility];  Service: Endoscopy;  Laterality: N/A;  . Balloon dilation  03/22/2011    Procedure: BALLOON DILATION;  Surgeon: Inda Castle, MD;  Location: [facility] ENDOSCOPY;  Service: Endoscopy;  Laterality: N/A;   kelly/ebp   HPI:  Patient is a 74 y/o male admitted with RUE numbness and blurred vision, resolved in ED per MD note. MRI brain:No acute intracranial process.   Assessment / Plan / Recommendation Clinical Impression  Pt appeared to exhibit min-mild dysarthria with 100% intelligible conversational speech exhibiting slight posterior phonemic distortions versus hyponasality. Pt reports speech is baseline and feels wife would agree. Mild impulsivity following multistep directions. Minimal supervision/prompts using menu; clock drawing and verbal problem solving independent. Encouraged him to continue to use writing as memory strategy. No ST needed.     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[employer])   Cognition  Overall Cognitive Status: Within Functional Limits for tasks assessed Orientation Level: Oriented X4 Behaviors: Impulsive Safety/Judgment: Appears intact    Comprehension  Auditory Comprehension Overall Auditory Comprehension: Appears within functional limits for tasks assessed Visual Recognition/Discrimination Discrimination: Not tested Reading Comprehension Reading Status: Within funtional limits    Expression Expression Primary Mode of Expression: Verbal Verbal Expression Overall Verbal Expression: Appears within functional limits for tasks assessed Pragmatics:  No impairment Written Expression Dominant Hand: Right Written Expression:  (clock drawing  only)   Oral / Motor Oral Motor/Sensory Function Overall Oral Motor/Sensory Function:  (? lingual weakness during various phonems (posterior ?)) Motor Speech Overall Motor Speech: Appears within functional limits for tasks assessed Intelligibility: Intelligible Motor Planning: Witnin functional limits   GO     Matthew Luna 05/12/2014, 9:49 AM   Orbie Pyo Colvin Caroli.Ed [employer] [phone]

## 2014-05-12 NOTE — Progress Notes (Signed)
Pt discharging with his family at this time alert, verbal taking all personal belongings. Neuro intact. No noted distress or complaints of discomfort. IV discontinued, applied dry dressing. Discharge instructions provided with a prescription for aspirin pt to pick up other medications ordered from pharmacy in [place]. Pt made aware of follow up appts scheduled.

## 2014-05-12 NOTE — Progress Notes (Signed)
Pt ambulated to the bathroom without assistive device needed. Gait steady.  Pt returned to chair to sit up and watch television. Neuro intact. No noted distress. . Safety measures intact. Call bell within reach. Will continue to monitor.

## 2014-05-12 NOTE — Discharge Summary (Signed)
Physician Discharge Summary  Matthew Luna ZDG:[id number] DOB: 1940-02-26 DOA: 05/10/2014  PCP: Odette Fraction, MD  Admit date: 05/10/2014 Discharge date: 05/12/2014  Recommendations for Outpatient Follow-up:  1. PCP in 2 weeks for follow up blood pressure and BMP for creatinine.  Neurology would like his goal SBP to be between 120 and 595 mmHg systolic. Consider renal artery duplex and repeat sleep study if patient amenable.   2. Dr. Erlinda Hong in 2 months for follow up TIA and extensive intracranial atherosclerosis 3. Continue aspirin through 7/11, then stop 4. Continue plavix indefinitely 5. Started statin 6. Routine follow up with vascular surgery for AAA  Discharge Diagnoses:  Principal Problem:   TIA (transient ischemic attack) Active Problems:   Essential hypertension   AAA (abdominal aortic aneurysm) without rupture   Sleep apnea   Diplopia   Intracranial atherosclerosis   Chronic kidney disease, stage 3   Discharge Condition: stable, improved  Diet recommendation: low sodium  Wt Readings from Last 3 Encounters:  05/11/14 104.1 kg (229 lb 8 oz)  11/04/13 106.142 kg (234 lb)  10/17/13 105.235 kg (232 lb)    History of present illness:   74 yo male with blurred vision and right arm numbness earlier today at home, called 911. Resolved soon after arrival to ED, code stroke was called and pt was evaluated immediately by neuro team. Symptoms had resolved by time of hospitalist exam. No prior h/o cva/tia and had not been taking daily aspirin prior to admission.  Hospital Course:   TIA secondary to intracranial atherosclerosis - MRI brain: No acute process, mild to moderate white matter changes consistent with chronic small vessel disease - MRA brain: High-grade stenosis versus occlusion of the proximal right P1 segment, mid to high-grade stenosis of the left proximal P2 segment, atherosclerosis and stenosis of bilateral superior cerebellar arteries with a possible  occlusion of the left. Mild stenosis of the right supraclinoid internal carotid artery -  CT angio neck:  High grade flow limiting stenosis of the left vertebral origin similar to 2009, no flow reducing lesion of extracranial carotids and only minor atherosclerosis at the bifurcations - ECHO: EF 55-60% with normal wall motion, grade 1 DD - Tele: Sinus bradycardia - PT/OT recommending no follow up - Passed swallow evaluation - Due to high-grade stenosis, ASA plus Plavix for three months, then discontinue aspirin and continue monotherapy plavix - Started high-dose statin - Hemoccult A1c, lipid panel as below  Essential hypertension, blood pressure mildly elevated. Has been on numerous blood pressure medications with side effects. Has been tolerating this beta blocker, however.  Goal SBP 120-150 per neurology. - Continue beta blocker - Started lisinopril, however, his creatinine rose AND it dropped his blood pressure below his target range  Dyslipidemia/hyperlipidemia - Started high-dose statin  Chronic kidney disease stage 3 - Minimize nephrotoxins and renally dose medications -  Consider renal artery duplex as outpatient  Sleep apnea, is supposed to wear cpap machine but wears breath right strips instead - Advised him to have repeat sleep study with his breath-right strips on  Ulcerative colitis, AAA, and esophageal stricture, stable   Consultants:  Neurology  Procedures:  CT head  MRI brain  MRA brain  Echo  Carotid duplex  Antibiotics:  None  Discharge Exam: Filed Vitals:   05/12/14 0939  BP: 134/86  Pulse: 58  Temp: 97.5 F (36.4 C)  Resp: 18   Filed Vitals:   05/11/14 2142 05/12/14 0140 05/12/14 0652 05/12/14 0939  BP: 135/84 161/94  131/72 134/86  Pulse: 53 54 53 58  Temp: 97.5 F (36.4 C) 97.9 F (36.6 C) 97.6 F (36.4 C) 97.5 F (36.4 C)  TempSrc: Oral Oral Oral Oral  Resp: 18 18 18 18   Height:      Weight:      SpO2: 97% 94%  94% 97%    General: Obese male, No acute distress  Cardiovascular: RRR, nl S1, S2 no mrg, 2+ pulses, warm extremities  Respiratory: CTAB, no increased WOB  MSK: Normal tone and bulk, no LEE  Neuro: CN II-XII grossly intact, strength 5/5, sensation intact to light touch throughout, no dysmetria arms/legs.   Discharge Instructions      Discharge Instructions    Ambulatory referral to Neurology    Complete by:  As directed   Dr. Erlinda Hong requests followup in 2 months     Call MD for:  difficulty breathing, headache or visual disturbances    Complete by:  As directed      Call MD for:  extreme fatigue    Complete by:  As directed      Call MD for:  hives    Complete by:  As directed      Call MD for:  persistant dizziness or light-headedness    Complete by:  As directed      Call MD for:  persistant nausea and vomiting    Complete by:  As directed      Call MD for:  severe uncontrolled pain    Complete by:  As directed      Call MD for:  temperature >100.4    Complete by:  As directed      Diet - low sodium heart healthy    Complete by:  As directed      Discharge instructions    Complete by:  As directed   You were hospitalized with double vision and other symptoms concerning for stroke.  You did not have a stroke, but you had a "mini-stroke" called a transient ischemic attack, probably caused by narrowing of the blood vessels of the brain caused by plaque build up.  People who have this sort of plaque build up can have it in other places too, such as the legs, the heart, and the kidneys.  Talk to your primary care doctor if you have pain in your legs with walking or chest pains when you exert yourself.  Please continue your blood pressure medication.  Start aspirin 81mg  once a day for the next three months through 08/10/2014, then stop.  Continue plavix indefinitely.  I have also started you on lipitor (atorvastatin), which helps with cholesterol and also helps reduce plaques.   Please call 911 if you have chest pressure with shortness of breath or nausea, weakness, numbness, slurred speech, confusion, blurry vision.     Increase activity slowly    Complete by:  As directed             Medication List    STOP taking these medications        predniSONE 20 MG tablet  Commonly known as:  DELTASONE      TAKE these medications        aspirin 81 MG EC tablet  Take 1 tablet (81 mg total) by mouth daily.     atorvastatin 80 MG tablet  Commonly known as:  LIPITOR  Take 1 tablet (80 mg total) by mouth daily at 6 PM.     BREATHE RIGHT SNORE RELIEF  MT  Use as directed 1 strip in the mouth or throat at bedtime.     citalopram 10 MG tablet  Commonly known as:  CELEXA  Take 5 mg by mouth daily.     clopidogrel 75 MG tablet  Commonly known as:  PLAVIX  Take 1 tablet (75 mg total) by mouth daily.     fluticasone 50 MCG/ACT nasal spray  Commonly known as:  FLONASE  USE 1 SPRAY each nostril daily as needed.     nebivolol 10 MG tablet  Commonly known as:  BYSTOLIC  Take 0.5 tablets (5 mg total) by mouth 2 (two) times daily.       Follow-up Information    Follow up with Xu,Jindong, MD In 2 months.   Specialty:  Neurology   Why:  Stroke Clinic, Office will call you with appointment date & time   Contact information:   [address] [place] [address] [phone]       Follow up with [facility] TOM, MD. Schedule an appointment as soon as possible for a visit in 2 weeks.   Specialty:  Family Medicine   Contact information:   [address] [place] [address] [phone]        The results of significant diagnostics from this hospitalization (including imaging, microbiology, ancillary and laboratory) are listed below for reference.    Significant Diagnostic Studies: Ct Head (brain) Wo Contrast  05/10/2014   CLINICAL DATA:  Code stroke.  Right-sided weakness.  EXAM: CT HEAD WITHOUT CONTRAST  TECHNIQUE:  Contiguous axial images were obtained from the base of the skull through the vertex without intravenous contrast.  COMPARISON:  CT head without contrast 02/02/2013  FINDINGS: The basal ganglia and insular cortex is intact bilaterally. No acute cortical infarct, hemorrhage, or mass lesion is present. Mild generalized atrophy is within normal limits for age.  Vascular calcifications are present in the dural scratch the vascular calcifications are present in the vertebral arteries at the dural margin. There are vascular calcifications in the cavernous internal carotid arteries as well. The paranasal sinuses and mastoid air cells are clear. The calvarium is intact. A remote right frontal sinus fracture is again seen. The globes and orbits are within normal limits.  ASPECTS score = 10/10  [place] Stroke Program Early CT Score  Normal score = 10  IMPRESSION: 1. No acute intracranial abnormality. 2. Mild atrophy is within normal limits for age. 3. Remote right frontal sinus fracture.   Electronically Signed   By: San Morelle M.D.   On: 05/10/2014 21:41   Ct Angio Neck W/cm &/or Wo/cm  05/11/2014   CLINICAL DATA:  Dizziness, blurred vision, and RIGHT-sided weakness. Initial encounter.  EXAM: CT ANGIOGRAPHY NECK  TECHNIQUE: Multidetector CT imaging of the neck was performed using the standard protocol during bolus administration of intravenous contrast. Multiplanar CT image reconstructions and MIPs were obtained to evaluate the vascular anatomy. Carotid stenosis measurements (when applicable) are obtained utilizing NASCET criteria, using the distal internal carotid diameter as the denominator.  CONTRAST:  43mL OMNIPAQUE IOHEXOL 350 MG/ML SOLN  COMPARISON:  CT head without contrast 05/10/2014. MRI and MRA 05/11/2014. MRA extracranial from 2009  FINDINGS: Aortic arch: Standard branching. Imaged portion shows no evidence of aneurysm or dissection. Moderate atheromatous change transverse arch. No significant stenosis  of the major arch vessel origins.  Right carotid system: Minor atheromatous change at the RIGHT ICA origin. No evidence of dissection, stenosis (50% or greater) or occlusion.  Left carotid system: Non stenotic atheromatous change at the LEFT internal carotid artery origin, primarily calcific plaque. No evidence of dissection, stenosis (50% or greater) or occlusion.  Vertebral arteries: Codominant. Non stenotic irregularity RIGHT vertebral origin. High-grade stenosis LEFT vertebral origin, estimated 75-90%. Both contribute to formation of the basilar.  Nonvascular structures: Moderately advanced cervical spondylosis. No worrisome osseous lesions. No lung apex lesion. Mild thyromegaly. Subcentimeter RIGHT inferior lobe thyroid hypo attenuating lesion, non worrisome. Airway midline. No adenopathy.  Compared with prior MRA neck, a similar appearance is noted.  IMPRESSION: No extracranial carotid artery flow reducing lesion is evident. Minor atheromatous change at the bifurcations.  High-grade flow-limiting stenosis LEFT vertebral origin. This is similar to 2009.   Electronically Signed   By: Rolla Flatten M.D.   On: 05/11/2014 10:23   Mr Brain Wo Contrast  05/11/2014   ADDENDUM REPORT: 05/11/2014 23:16  ADDENDUM: Correction:  Constellation of findings would be better characterized on CTA of the HEAD (not neck) as clinically indicated.   Electronically Signed   By: Elon Alas   On: 05/11/2014 23:16   05/11/2014   CLINICAL DATA:  Blurry vision and RIGHT arm numbness beginning earlier today, symptoms resolved after arrival in the ER department. History of prostate cancer, hypertension, dizziness.  EXAM: MRI HEAD WITHOUT CONTRAST  MRA HEAD WITHOUT CONTRAST  TECHNIQUE: Multiplanar, multiecho pulse sequences of the brain and surrounding structures were obtained without intravenous contrast. Angiographic images of the head were obtained using MRA technique without contrast.  COMPARISON:  CT of the head May 10, 2014  and MRI of the brain Jun 10, 2013 and MRA of the head December 23, 2007  FINDINGS: MRI HEAD FINDINGS  The ventricles and sulci are normal for patient's age. No abnormal parenchymal signal, mass lesions, mass effect. No reduced diffusion to suggest acute ischemia. No susceptibility artifact to suggest hemorrhage. Patchy supratentorial white matter T2 hyperintensities are unchanged, within normal range for patient's age.  No abnormal extra-axial fluid collections. No extra-axial masses though, contrast enhanced sequences would be more sensitive. Normal major intracranial vascular flow voids seen at the skull base.  Ocular globes and orbital contents are unremarkable though not tailored for evaluation. No abnormal sellar expansion. Trace ethmoid mucosal thickening without paranasal sinus air-fluid levels. Trace bilateral mastoid effusions. No suspicious calvarial bone marrow signal. No abnormal sellar expansion. Craniocervical junction maintained.  MRA HEAD FINDINGS  Anterior circulation: Normal flow related enhancement of the included cervical, petrous, cavernous and supra clinoid internal carotid arteries. Mild narrowing of the RIGHT supraclinoid internal carotid artery. Patent anterior communicating artery. Normal flow related enhancement of the anterior and middle cerebral arteries, including more distal segments.  No large vessel occlusion, high-grade stenosis, abnormal luminal irregularity, aneurysm.  Posterior circulation: LEFT vertebral artery is dominant. Basilar artery is patent, moderate luminal irregularity of the bilateral superior cerebellar arteries of possible mid to distal occlusion on the LEFT. High-grade stenosis RIGHT proximal of P1 segment with loss of distal flow related enhancement. Mid to high-grade stenosis LEFT proximal P2 segment.  No abnormal luminal irregularity, aneurysm.  IMPRESSION: MRI HEAD: No acute intracranial process.  Involutional changes. Mild to moderate white matter changes  suggest chronic small vessel ischemic disease.  MRA HEAD: High-grade stenosis versus occlusion of proximal RIGHT P1 segment, mid to high-grade stenosis LEFT proximal P2 segment.  Atherosclerosis/stenosis of bilateral superior cerebellar arteries, possible occlusion on the LEFT.  Mild stenosis RIGHT supraclinoid internal carotid artery.  Constellation of findings would be better characterized on  CTA of the neck as clinically indicated.  Electronically Signed: By: Elon Alas On: 05/11/2014 03:15   Mr Jodene Nam Head/brain Wo Cm  05/11/2014   ADDENDUM REPORT: 05/11/2014 23:16  ADDENDUM: Correction:  Constellation of findings would be better characterized on CTA of the HEAD (not neck) as clinically indicated.   Electronically Signed   By: Elon Alas   On: 05/11/2014 23:16   05/11/2014   CLINICAL DATA:  Blurry vision and RIGHT arm numbness beginning earlier today, symptoms resolved after arrival in the ER department. History of prostate cancer, hypertension, dizziness.  EXAM: MRI HEAD WITHOUT CONTRAST  MRA HEAD WITHOUT CONTRAST  TECHNIQUE: Multiplanar, multiecho pulse sequences of the brain and surrounding structures were obtained without intravenous contrast. Angiographic images of the head were obtained using MRA technique without contrast.  COMPARISON:  CT of the head May 10, 2014 and MRI of the brain Jun 10, 2013 and MRA of the head December 23, 2007  FINDINGS: MRI HEAD FINDINGS  The ventricles and sulci are normal for patient's age. No abnormal parenchymal signal, mass lesions, mass effect. No reduced diffusion to suggest acute ischemia. No susceptibility artifact to suggest hemorrhage. Patchy supratentorial white matter T2 hyperintensities are unchanged, within normal range for patient's age.  No abnormal extra-axial fluid collections. No extra-axial masses though, contrast enhanced sequences would be more sensitive. Normal major intracranial vascular flow voids seen at the skull base.  Ocular globes  and orbital contents are unremarkable though not tailored for evaluation. No abnormal sellar expansion. Trace ethmoid mucosal thickening without paranasal sinus air-fluid levels. Trace bilateral mastoid effusions. No suspicious calvarial bone marrow signal. No abnormal sellar expansion. Craniocervical junction maintained.  MRA HEAD FINDINGS  Anterior circulation: Normal flow related enhancement of the included cervical, petrous, cavernous and supra clinoid internal carotid arteries. Mild narrowing of the RIGHT supraclinoid internal carotid artery. Patent anterior communicating artery. Normal flow related enhancement of the anterior and middle cerebral arteries, including more distal segments.  No large vessel occlusion, high-grade stenosis, abnormal luminal irregularity, aneurysm.  Posterior circulation: LEFT vertebral artery is dominant. Basilar artery is patent, moderate luminal irregularity of the bilateral superior cerebellar arteries of possible mid to distal occlusion on the LEFT. High-grade stenosis RIGHT proximal of P1 segment with loss of distal flow related enhancement. Mid to high-grade stenosis LEFT proximal P2 segment.  No abnormal luminal irregularity, aneurysm.  IMPRESSION: MRI HEAD: No acute intracranial process.  Involutional changes. Mild to moderate white matter changes suggest chronic small vessel ischemic disease.  MRA HEAD: High-grade stenosis versus occlusion of proximal RIGHT P1 segment, mid to high-grade stenosis LEFT proximal P2 segment.  Atherosclerosis/stenosis of bilateral superior cerebellar arteries, possible occlusion on the LEFT.  Mild stenosis RIGHT supraclinoid internal carotid artery.  Constellation of findings would be better characterized on CTA of the neck as clinically indicated.  Electronically Signed: By: Elon Alas On: 05/11/2014 03:15    Microbiology: No results found for this or any previous visit (from the past 240 hour(s)).   Labs: Basic Metabolic  Panel:  Recent Labs Lab 05/10/14 2125 05/10/14 2132 05/12/14 0711  NA 139 142 141  K 3.8 3.7 4.1  CL 106 106 106  CO2 24  --  29  GLUCOSE 104* 103* 91  BUN 22 25* 22  CREATININE 1.40* 1.30 1.60*  CALCIUM 8.8  --  9.0   Liver Function Tests:  Recent Labs Lab 05/10/14 2125  AST 21  ALT 12  ALKPHOS 83  BILITOT 0.6  PROT  6.2  ALBUMIN 3.3*   No results for input(s): LIPASE, AMYLASE in the last 168 hours. No results for input(s): AMMONIA in the last 168 hours. CBC:  Recent Labs Lab 05/10/14 2125 05/10/14 2132 05/12/14 0711  WBC 10.8*  --  8.4  NEUTROABS 6.2  --   --   HGB 14.2 15.3 14.6  HCT 42.3 45.0 44.1  MCV 96.1  --  96.7  PLT 213  --  227   Cardiac Enzymes: No results for input(s): CKTOTAL, CKMB, CKMBINDEX, TROPONINI in the last 168 hours. BNP: BNP (last 3 results) No results for input(s): BNP in the last 8760 hours.  ProBNP (last 3 results) No results for input(s): PROBNP in the last 8760 hours.  CBG:  Recent Labs Lab 05/10/14 2340  GLUCAP 90    Time coordinating discharge: 35 minutes  Signed:  Pike Scantlebury  [facility] 05/12/2014, 11:28 AM

## 2014-05-12 NOTE — Evaluation (Signed)
Occupational Therapy Evaluation/ Discharge  Patient Details Name: Matthew Luna MRN: [id number] DOB: Jun 09, 1940 Today's Date: 05/12/2014    History of Present Illness Patient is a 74 y/o male admitted with RUE nunbness and blurred vision, resolved in ED. MRA brain: High-grade stenosis versus occlusion of the proximal right P1 segment, mid to high-grade stenosis of the left proximal P2 segment, atherosclerosis and stenosis of bilateral superior cerebellar arteries with a possible occlusion of the left.    Clinical Impression   All education is complete and patient indicates understanding. Pt educated signs/ symptoms of stroke. Reviewed risk factors currently and pt asking questions. Pt very eager to d/c home and even demonstrated by running in the hallway with max cues to avoid running. Pt does not require further therapy at this time. Question vision deficits vs no glasses present. Pt reports blurry vision much improved.      Follow Up Recommendations  No OT follow up    Equipment Recommendations  None recommended by OT    Recommendations for Other Services       Precautions / Restrictions Precautions Precautions: Fall Restrictions Weight Bearing Restrictions: No      Mobility Bed Mobility Overal bed mobility: Modified Independent                Transfers Overall transfer level: Modified independent                    Balance                                            ADL Overall ADL's : Modified independent                                       General ADL Comments: PT completed simulation of shower transfer, don socks, completed bed mobility, ate breakfast MOD I. Pt with difficulty reading newspaper this session but no glasses present. Difficult to assess visual change or lack of glasses. Pt reading 40 -30 font size without deficit     Vision Vision Assessment?: No apparent visual deficits   Perception      Praxis      Pertinent Vitals/Pain Pain Assessment: 0-10 Pain Score: 2  Pain Location: dull headache all night Pain Descriptors / Indicators: Dull     Hand Dominance Right   Extremity/Trunk Assessment Upper Extremity Assessment Upper Extremity Assessment: Overall WFL for tasks assessed   Lower Extremity Assessment Lower Extremity Assessment: Defer to PT evaluation   Cervical / Trunk Assessment Cervical / Trunk Assessment: Normal   Communication Communication Communication: No difficulties   Cognition Arousal/Alertness: Awake/alert Behavior During Therapy: Impulsive Overall Cognitive Status: Within Functional Limits for tasks assessed                     General Comments       Exercises       Shoulder Instructions      Home Living Family/patient expects to be discharged to:: Private residence Living Arrangements: Spouse/significant other Available Help at Discharge: Family Type of Home: House Home Access: Level entry     Home Layout: One level     Bathroom Shower/Tub: Occupational psychologist: Standard     Home Equipment: None  Prior Functioning/Environment Level of Independence: Independent             OT Diagnosis:     OT Problem List:     OT Treatment/Interventions:      OT Goals(Current goals can be found in the care plan section) Acute Rehab OT Goals Potential to Achieve Goals: Good  OT Frequency:     Barriers to D/C:            Co-evaluation              End of Session Nurse Communication: Mobility status;Precautions  Activity Tolerance: Patient tolerated treatment well Patient left: in bed;with call bell/phone within reach   Time: 0805-0831 OT Time Calculation (min): 26 min Charges:  OT General Charges $OT Visit: 1 Procedure OT Evaluation $Initial OT Evaluation Tier I: 1 Procedure G-Codes: OT G-codes **NOT FOR INPATIENT CLASS** Functional Assessment Tool Used: clinical  judgement Functional Limitation: Self care Self Care Current Status (V8721): 0 percent impaired, limited or restricted Self Care Goal Status (L8727): 0 percent impaired, limited or restricted Self Care Discharge Status (M1848): 0 percent impaired, limited or restricted  Parke Poisson B 05/12/2014, 8:33 AM Pager: [phone]

## 2014-05-14 ENCOUNTER — Telehealth: Payer: Self-pay | Admitting: *Deleted

## 2014-05-14 ENCOUNTER — Encounter: Payer: Self-pay | Admitting: Family Medicine

## 2014-05-14 ENCOUNTER — Ambulatory Visit (INDEPENDENT_AMBULATORY_CARE_PROVIDER_SITE_OTHER): Payer: PPO | Admitting: Family Medicine

## 2014-05-14 VITALS — BP 110/80 | HR 60 | Temp 97.6°F | Resp 18 | Wt 232.0 lb

## 2014-05-14 DIAGNOSIS — Z09 Encounter for follow-up examination after completed treatment for conditions other than malignant neoplasm: Secondary | ICD-10-CM | POA: Diagnosis not present

## 2014-05-14 DIAGNOSIS — G4733 Obstructive sleep apnea (adult) (pediatric): Secondary | ICD-10-CM | POA: Diagnosis not present

## 2014-05-14 DIAGNOSIS — G459 Transient cerebral ischemic attack, unspecified: Secondary | ICD-10-CM

## 2014-05-14 NOTE — Progress Notes (Signed)
Subjective:    Patient ID: Matthew Luna, male    DOB: [birth year], 74 y.o.   MRN: [id number]  HPI Patient was recently admitted for a TIA. I have copied relevant portions of the discharge summary and included them below for my reference:  Admit date: 05/10/2014 Discharge date: 05/12/2014  Recommendations for Outpatient Follow-up:  1. PCP in 2 weeks for follow up blood pressure and BMP for creatinine. Neurology would like his goal SBP to be between 120 and 408 mmHg systolic. Consider renal artery duplex and repeat sleep study if patient amenable.  2. Dr. Erlinda Hong in 2 months for follow up TIA and extensive intracranial atherosclerosis 3. Continue aspirin through 7/11, then stop 4. Continue plavix indefinitely 5. Started statin 6. Routine follow up with vascular surgery for AAA  Discharge Diagnoses:  Principal Problem:  TIA (transient ischemic attack) Active Problems:  Essential hypertension  AAA (abdominal aortic aneurysm) without rupture  Sleep apnea  Diplopia  Intracranial atherosclerosis  Chronic kidney disease, stage 3   Discharge Condition: stable, improved  Diet recommendation: low sodium  Wt Readings from Last 3 Encounters:  05/11/14 104.1 kg (229 lb 8 oz)  11/04/13 106.142 kg (234 lb)  10/17/13 105.235 kg (232 lb)    History of present illness:   74 yo male with blurred vision and right arm numbness earlier today at home, called 911. Resolved soon after arrival to ED, code stroke was called and pt was evaluated immediately by neuro team. Symptoms had resolved by time of hospitalist exam. No prior h/o cva/tia and had not been taking daily aspirin prior to admission.  Hospital Course:   TIA secondary to intracranial atherosclerosis - MRI brain: No acute process, mild to moderate white matter changes consistent with chronic small vessel disease - MRA brain: High-grade stenosis versus occlusion of the proximal right P1 segment, mid to  high-grade stenosis of the left proximal P2 segment, atherosclerosis and stenosis of bilateral superior cerebellar arteries with a possible occlusion of the left. Mild stenosis of the right supraclinoid internal carotid artery - CT angio neck: High grade flow limiting stenosis of the left vertebral origin similar to 2009, no flow reducing lesion of extracranial carotids and only minor atherosclerosis at the bifurcations - ECHO: EF 55-60% with normal wall motion, grade 1 DD - Tele: Sinus bradycardia - PT/OT recommending no follow up - Passed swallow evaluation - Due to high-grade stenosis, ASA plus Plavix for three months, then discontinue aspirin and continue monotherapy plavix - Started high-dose statin - Hemoccult A1c, lipid panel as below  Essential hypertension, blood pressure mildly elevated. Has been on numerous blood pressure medications with side effects. Has been tolerating this beta blocker, however. Goal SBP 120-150 per neurology. - Continue beta blocker - Started lisinopril, however, his creatinine rose AND it dropped his blood pressure below his target range  Dyslipidemia/hyperlipidemia - Started high-dose statin  Chronic kidney disease stage 3 - Minimize nephrotoxins and renally dose medications - Consider renal artery duplex as outpatient  Sleep apnea, is supposed to wear cpap machine but wears breath right strips instead - Advised him to have repeat sleep study with his breath-right strips on  Ulcerative colitis, AAA, and esophageal stricture, stable   Consultants:  Neurology  Procedures:  CT head  MRI brain  MRA brain  Echo  Carotid duplex  Antibiotics:  None  Discharge Exam: Filed Vitals:   05/12/14 0939  BP: 134/86  Pulse: 58  Temp: 97.5 F (36.4 C)  Resp: 18  Filed Vitals:   05/11/14 2142 05/12/14 0140 05/12/14 0652 05/12/14 0939  BP: 135/84 161/94 131/72 134/86  Pulse: 53 54 53 58  Temp:  97.5 F (36.4 C) 97.9 F (36.6 C) 97.6 F (36.4 C) 97.5 F (36.4 C)  TempSrc: Oral Oral Oral Oral  Resp: 18 18 18 18   Height:      Weight:      SpO2: 97% 94% 94% 97%    General: Obese male, No acute distress  Cardiovascular: RRR, nl S1, S2 no mrg, 2+ pulses, warm extremities  Respiratory: CTAB, no increased WOB  MSK: Normal tone and bulk, no LEE  Neuro: CN II-XII grossly intact, strength 5/5, sensation intact to light touch throughout, no dysmetria arms/legs.   Discharge Instructions      Discharge Instructions    Ambulatory referral to Neurology  Complete by: As directed   Dr. Erlinda Hong requests followup in 2 months     Call MD for: difficulty breathing, headache or visual disturbances  Complete by: As directed      Call MD for: extreme fatigue  Complete by: As directed      Call MD for: hives  Complete by: As directed      Call MD for: persistant dizziness or light-headedness   Complete by: As directed      Call MD for: persistant nausea and vomiting  Complete by: As directed      Call MD for: severe uncontrolled pain  Complete by: As directed      Call MD for: temperature >100.4  Complete by: As directed      Diet - low sodium heart healthy  Complete by: As directed      Discharge instructions  Complete by: As directed   You were hospitalized with double vision and other symptoms concerning for stroke. You did not have a stroke, but you had a "mini-stroke" called a transient ischemic attack, probably caused by narrowing of the blood vessels of the brain caused by plaque build up. People who have this sort of plaque build up can have it in other places too, such as the legs, the heart, and the kidneys. Talk to your primary care doctor if you have pain in your legs with walking or chest pains when you exert yourself. Please continue your blood  pressure medication. Start aspirin 81mg  once a day for the next three months through 08/10/2014, then stop. Continue plavix indefinitely. I have also started you on lipitor (atorvastatin), which helps with cholesterol and also helps reduce plaques. Please call 911 if you have chest pressure with shortness of breath or nausea, weakness, numbness, slurred speech, confusion, blurry vision.     Increase activity slowly  Complete by: As directed             Medication List    STOP taking these medications       predniSONE 20 MG tablet  Commonly known as: DELTASONE      TAKE these medications       aspirin 81 MG EC tablet  Take 1 tablet (81 mg total) by mouth daily.     atorvastatin 80 MG tablet  Commonly known as: LIPITOR  Take 1 tablet (80 mg total) by mouth daily at 6 PM.     BREATHE RIGHT SNORE RELIEF MT  Use as directed 1 strip in the mouth or throat at bedtime.     citalopram 10 MG tablet  Commonly known as: CELEXA  Take 5 mg by mouth daily.  clopidogrel 75 MG tablet  Commonly known as: PLAVIX  Take 1 tablet (75 mg total) by mouth daily.     fluticasone 50 MCG/ACT nasal spray  Commonly known as: FLONASE  USE 1 SPRAY each nostril daily as needed.     nebivolol 10 MG tablet  Commonly known as: BYSTOLIC  Take 0.5 tablets (5 mg total) by mouth 2 (two) times daily.           Patient was admitted with double vision, right arm numbness, and ataxia. He was diagnosed with a TIA. Symptoms resolve spontaneously after one hour. He did not require TPA. MRA of the neck revealed left vertebral artery high-grade stenosis. Patient was started on dual antiplatelet therapy with aspirin and Plavix for the next 3 months. After July he is to continue Plavix monotherapy indefinitely. Blood pressure fluctuated significantly during the hospitalization. The patient developed prerenal azotemia. He is supposed to have a  BMP rechecked as an outpatient. Today he is doing well. His blood pressures well controlled at 110/80. He denies any chest pain shortness of breath or dyspnea on exertion. Patient is still requiring the Breathe Right strips and is not wearing his CPAP machine. Past Medical History  Diagnosis Date  . Mononeuritis of unspecified site   . Actinic keratosis   . Nonspecific abnormal results of thyroid function study   . Hypertrophy of prostate without urinary obstruction and other lower urinary tract symptoms (LUTS)   . Unspecified essential hypertension   . Stricture and stenosis of esophagus   . Left sided ulcerative (chronic) colitis   . Duodenitis without mention of hemorrhage   . Esophageal reflux   . Diverticulosis of colon (without mention of hemorrhage)   . Prostate cancer     cryotherapy 2011 [facility])  . Sleep apnea     ahi 75, cpap 13    Past Surgical History  Procedure Laterality Date  . Cholecystectomy    . Prostate surgery    . Esophagogastroduodenoscopy  03/22/2011    Procedure: ESOPHAGOGASTRODUODENOSCOPY (EGD);  Surgeon: Inda Castle, MD;  Location: [facility];  Service: Endoscopy;  Laterality: N/A;  . Balloon dilation  03/22/2011    Procedure: BALLOON DILATION;  Surgeon: Inda Castle, MD;  Location: [facility];  Service: Endoscopy;  Laterality: N/A;  kelly/ebp   Current Outpatient Prescriptions on File Prior to Visit  Medication Sig Dispense Refill  . aspirin EC 81 MG EC tablet Take 1 tablet (81 mg total) by mouth daily.    Marland Kitchen atorvastatin (LIPITOR) 80 MG tablet Take 1 tablet (80 mg total) by mouth daily at 6 PM. 30 tablet 0  . citalopram (CELEXA) 10 MG tablet Take 5 mg by mouth daily.    . Misc. Throat Products (BREATHE RIGHT SNORE RELIEF MT) Use as directed 1 strip in the mouth or throat at bedtime.    . nebivolol (BYSTOLIC) 10 MG tablet Take 0.5 tablets (5 mg total) by mouth 2 (two) times daily. 30 tablet 5   No current facility-administered medications on file  prior to visit.   Allergies  Allergen Reactions  . Metronidazole Anaphylaxis  . Amoxicillin-Pot Clavulanate Other (See Comments)    UNKNOWN  . Ciprofloxacin Other (See Comments)    UNKNOWN  . Mesalamine     REACTION: Intolerance   History   Social History  . Marital Status: Married    Spouse Name: N/A  . Number of Children: N/A  . Years of Education: N/A   Occupational History  . Not  on file.   Social History Main Topics  . Smoking status: Never Smoker   . Smokeless tobacco: Never Used  . Alcohol Use: 1.2 oz/week    2 Glasses of wine per week  . Drug Use: No  . Sexual Activity: Not on file   Other Topics Concern  . Not on file   Social History Narrative      Review of Systems  All other systems reviewed and are negative.      Objective:   Physical Exam  Constitutional: He is oriented to person, place, and time. He appears well-developed and well-nourished.  Neck: Neck supple.  Cardiovascular: Normal rate, regular rhythm, normal heart sounds and intact distal pulses.   No murmur heard. Pulmonary/Chest: Effort normal and breath sounds normal. No respiratory distress. He has no wheezes. He has no rales.  Abdominal: Soft. Bowel sounds are normal. He exhibits no distension. There is no tenderness. There is no rebound and no guarding.  Lymphadenopathy:    He has no cervical adenopathy.  Neurological: He is alert and oriented to person, place, and time. He has normal reflexes. He displays normal reflexes. No cranial nerve deficit. He exhibits normal muscle tone. Coordination normal.  Vitals reviewed.         Assessment & Plan:  Hospital discharge follow-up  Transient cerebral ischemia, unspecified transient cerebral ischemia type  Obstructive sleep apnea - Plan: Ambulatory referral to Sleep Studies  Patient's blood pressure is controlled. I will have the patient repeat his sleep study while wearing his Breathe Right strips to see if his sleep apnea is  actually adequately controlled. If not I would strongly encourage the patient to resume CPAP. Patient was found to have an LDL cholesterol of 170. He was started on Lipitor 80 mg a day. He is due for repeat fasting lipid panel in July. He is to continue dual antiplatelet therapy until July. After July he will continue Plavix alone. I will have the patient return to check a CBC and a CMP to monitor his electrolytes and his prerenal azotemia. Recheck the patient in 3 months and await the results of the sleep study

## 2014-05-14 NOTE — Telephone Encounter (Signed)
Received fax from [place] [facility] [employer] [facility] [employer] on 05/13/14 stating that patient has been admitted to acute care hospital  Hospital: [facility]  Admit Date: 05/10/14  Dx:H53,2-Diplopia  Admitting Physician: Rachal David,MD  Pending [id number]

## 2014-05-15 ENCOUNTER — Telehealth: Payer: Self-pay | Admitting: *Deleted

## 2014-05-15 ENCOUNTER — Other Ambulatory Visit: Payer: PPO

## 2014-05-15 DIAGNOSIS — N183 Chronic kidney disease, stage 3 (moderate): Secondary | ICD-10-CM

## 2014-05-15 DIAGNOSIS — R7989 Other specified abnormal findings of blood chemistry: Secondary | ICD-10-CM

## 2014-05-15 DIAGNOSIS — I10 Essential (primary) hypertension: Secondary | ICD-10-CM

## 2014-05-15 LAB — CBC WITH DIFFERENTIAL/PLATELET
Basophils Absolute: 0.1 10*3/uL (ref 0.0–0.1)
Basophils Relative: 1 % (ref 0–1)
Eosinophils Absolute: 0.7 10*3/uL (ref 0.0–0.7)
Eosinophils Relative: 10 % — ABNORMAL HIGH (ref 0–5)
HCT: 43.7 % (ref 39.0–52.0)
Hemoglobin: 14.9 g/dL (ref 13.0–17.0)
Lymphocytes Relative: 32 % (ref 12–46)
Lymphs Abs: 2.2 10*3/uL (ref 0.7–4.0)
MCH: 32.3 pg (ref 26.0–34.0)
MCHC: 34.1 g/dL (ref 30.0–36.0)
MCV: 94.6 fL (ref 78.0–100.0)
MPV: 9.9 fL (ref 8.6–12.4)
Monocytes Absolute: 0.4 10*3/uL (ref 0.1–1.0)
Monocytes Relative: 5 % (ref 3–12)
Neutro Abs: 3.6 10*3/uL (ref 1.7–7.7)
Neutrophils Relative %: 52 % (ref 43–77)
Platelets: 259 10*3/uL (ref 150–400)
RBC: 4.62 MIL/uL (ref 4.22–5.81)
RDW: 14.5 % (ref 11.5–15.5)
WBC: 7 10*3/uL (ref 4.0–10.5)

## 2014-05-15 LAB — COMPLETE METABOLIC PANEL WITH GFR
ALT: 15 U/L (ref 0–53)
AST: 18 U/L (ref 0–37)
Albumin: 3.7 g/dL (ref 3.5–5.2)
Alkaline Phosphatase: 82 U/L (ref 39–117)
BUN: 24 mg/dL — ABNORMAL HIGH (ref 6–23)
CO2: 27 mEq/L (ref 19–32)
Calcium: 9.2 mg/dL (ref 8.4–10.5)
Chloride: 104 mEq/L (ref 96–112)
Creat: 1.44 mg/dL — ABNORMAL HIGH (ref 0.50–1.35)
GFR, Est African American: 56 mL/min — ABNORMAL LOW
GFR, Est Non African American: 48 mL/min — ABNORMAL LOW
Glucose, Bld: 99 mg/dL (ref 70–99)
Potassium: 4.2 mEq/L (ref 3.5–5.3)
Sodium: 137 mEq/L (ref 135–145)
Total Bilirubin: 0.6 mg/dL (ref 0.2–1.2)
Total Protein: 6.1 g/dL (ref 6.0–8.3)

## 2014-05-15 NOTE — Telephone Encounter (Signed)
Called pt and he is coming in this am to have done. Orders placed for CBC, CMP

## 2014-05-15 NOTE — Telephone Encounter (Signed)
-----   Message from Susy Frizzle, MD sent at 05/14/2014  5:05 PM EDT ----- Please have patient come by for cbc and cmp.

## 2014-06-11 ENCOUNTER — Ambulatory Visit (INDEPENDENT_AMBULATORY_CARE_PROVIDER_SITE_OTHER): Payer: PPO | Admitting: Neurology

## 2014-06-11 ENCOUNTER — Encounter: Payer: Self-pay | Admitting: Neurology

## 2014-06-11 VITALS — BP 122/60 | HR 68 | Resp 18 | Ht 75.0 in | Wt 226.0 lb

## 2014-06-11 DIAGNOSIS — G458 Other transient cerebral ischemic attacks and related syndromes: Secondary | ICD-10-CM

## 2014-06-11 DIAGNOSIS — G4733 Obstructive sleep apnea (adult) (pediatric): Secondary | ICD-10-CM | POA: Diagnosis not present

## 2014-06-11 DIAGNOSIS — R351 Nocturia: Secondary | ICD-10-CM | POA: Diagnosis not present

## 2014-06-11 DIAGNOSIS — E663 Overweight: Secondary | ICD-10-CM

## 2014-06-11 MED ORDER — CLOPIDOGREL BISULFATE 75 MG PO TABS: 75.0000 mg | ORAL_TABLET | Freq: Every day | ORAL | Status: DC

## 2014-06-11 MED ORDER — ATORVASTATIN CALCIUM 80 MG PO TABS: 80.0000 mg | ORAL_TABLET | Freq: Every day | ORAL | Status: DC

## 2014-06-11 NOTE — Patient Instructions (Addendum)
We will repeat your sleep study to re-affirm your sleep apnea and whether or not you still need CPAP. If you have more than mild OSA, I want you to consider treatment with CPAP. Please remember, the risks and ramifications of moderate to severe obstructive sleep apnea or OSA are: Cardiovascular disease, including congestive heart failure, stroke, difficult to control hypertension, arrhythmias, and even type 2 diabetes has been linked to untreated OSA. Sleep apnea causes disruption of sleep and sleep deprivation in most cases, which, in turn, can cause recurrent headaches, problems with memory, mood, concentration, focus, and vigilance. Most people with untreated sleep apnea report excessive daytime sleepiness, which can affect their ability to drive. Please do not drive if you feel sleepy.  I will see you back after your sleep study to go over the test results and where to go from there. We will call you after your sleep study and to set up an appointment at the time.   Continue exercising regularly and take your medications as directed. As discussed, secondary prevention is key after a stroke or TIA. This means: taking care of blood sugar values or diabetes management, good blood pressure (hypertension) control and optimizing cholesterol management, exercising daily or regularly within your own mobility limitations of course, and overall cardiovascular risk factor reduction, which includes screening for and treatment of obstructive sleep apnea (OSA) and weight management.   I have refilled your prescriptions for your Plavix and Lipitor - please ask Dr. Samella Parr office for refills.   Our sleep lab administrative assistant, Angelina Sheriff will call you to schedule your sleep study. If you don't hear back from her by next week please feel free to call her at [phone]. This is her direct line and please leave a message with your phone number to call back if you get the voicemail box.

## 2014-06-11 NOTE — Progress Notes (Signed)
Subjective:    Patient ID: Matthew Luna is a 74 y.o. male.  HPI      Dear Dr. Dennard Schaumann,   I saw your patient, Matthew Luna, upon your kind request in my sleep clinic for reevaluation of his obstructive sleep apnea after a recent Dx of TIA. The patient is unaccompanied today. As you know, Matthew Luna is a 74 year old right-handed gentleman with an underlying medical history of hypertension, esophageal stricture, ulcerative colitis, BPH, prostate cancer, AAA, and neuropathy (previously saw Dr. Brett Fairy), whom I first met on 02/19/2013 for excessive daytime somnolence. I advised him to have a sleep study done as he had risk factors for obstructive sleep apnea. He had a split-night sleep study on 03/06/2013 and I went over his test results with him in detail today. His sleep efficiency at baseline was 58.8% with a latency to sleep of 17 minutes and wake after sleep onset of 32.5 minutes with severe sleep fragmentation noted. He had a markedly elevated arousal index. He had absence of slow-wave and REM sleep prior to CPAP. He had no significant PLMS. He had no significant EKG or EEG changes. He had mild to moderate snoring. He had a total AHI of 74.9 per hour. Average oxygen saturation was only 90%, nadir was 81%. He was then titrated on CPAP. Sleep efficiency was improved at 93.9%. He had slow-wave sleep at 19.8% and REM sleep at 26.7% indicating REM rebound and a positive impact of CPAP on sleep architecture. Average oxygen saturation was 92, nadir was 82. CPAP was titrated from 5-13 cm and at the final pressure his AHI was 0.4 per hour. Supine REM sleep was achieved. He indicated that he had slept better than usual with CPAP. Based on the test results I prescribed CPAP therapy for home use. I reviewed the patient's CPAP compliance data from 04/09/2013 to 04/22/2013, which is a total of 14 days, during which time the patient used CPAP only for 4 days. The average usage for all days was minimal at a few  seconds. Essentially, the patient was not using his machine. We got in touch with the patient, and on 05/27/2013 the patient indicated that he did not wish to use CPAP any longer. He did not come back for follow-up. In the interim, the patient was admitted to the hospital recently for a suspected TIA. He had called 911 secondary to slurring of speech and right upper extremity numbness which was transient and was resolved by the time he was evaluated by the hospitalist. I reviewed the discharge summary from the hospitalization from 05/10/2014 through 05/12/2014. Principal diagnosis with TIA. He had a brain MRI without contrast which showed no acute findings and mild to moderate white matter changes. MRA brain showed high-grade stenosis versus occlusion of the proximal right P1 segment, mid to high-grade stenosis of the left proximal P2 segment, atherosclerosis and stenosis of bilateral superior cerebellar arteries with possible occlusion of the left. CTA head and neck showed high-grade flow limiting stenosis of the left vertebral artery. Echocardiogram showed an EF of 55-60%, with grade 1 diastolic dysfunction. Telemetry showed sinus bradycardia, he was started on aspirin plus Plavix for 3 months, with the plan to discontinue aspirin after 3 months and continue Plavix monotherapy. He was started on high-dose statin and continued on his beta blockers as well as started lisinopril. However this was stopped because of elevation of creatinine and blood pressure drop. He is on Lipitor He reports feeling at baseline. He uses Breathe Right strips  which helped his snoring. He has nocturia twice each night. He has no residual one-sided weakness or numbness or recurrent headaches. He denies morning headaches. He says he tried the CPAP for about a week but was uncomfortable with the mask and felt cold with it. I told him about his sleep test results and explained that Breathe Right strips may very well help snoring but do not  eliminate sleep apnea. He took his last pill of Plavix this morning and has requested a refill from your office but his pharmacy has not yet received prescriptions for Lipitor or Plavix from your office. I will bridge his prescriptions so he does not run out.   His Past Medical History Is Significant For: Past Medical History  Diagnosis Date  . Mononeuritis of unspecified site   . Actinic keratosis   . Nonspecific abnormal results of thyroid function study   . Hypertrophy of prostate without urinary obstruction and other lower urinary tract symptoms (LUTS)   . Unspecified essential hypertension   . Stricture and stenosis of esophagus   . Left sided ulcerative (chronic) colitis   . Duodenitis without mention of hemorrhage   . Esophageal reflux   . Diverticulosis of colon (without mention of hemorrhage)   . Prostate cancer     cryotherapy 2011 [facility])  . Sleep apnea     ahi 75, cpap 13     His Past Surgical History Is Significant For: Past Surgical History  Procedure Laterality Date  . Cholecystectomy    . Prostate surgery    . Esophagogastroduodenoscopy  03/22/2011    Procedure: ESOPHAGOGASTRODUODENOSCOPY (EGD);  Surgeon: Inda Castle, MD;  Location: [facility];  Service: Endoscopy;  Laterality: N/A;  . Balloon dilation  03/22/2011    Procedure: BALLOON DILATION;  Surgeon: Inda Castle, MD;  Location: [facility];  Service: Endoscopy;  Laterality: N/A;  kelly/ebp    His Family History Is Significant For: Family History  Problem Relation Age of Onset  . Cancer    . Anesthesia problems Neg Hx   . Hypotension Neg Hx   . Malignant hyperthermia Neg Hx   . Pseudochol deficiency Neg Hx   . Cancer Mother   . Snoring Father     His Social History Is Significant For: History   Social History  . Marital Status: Married    Spouse Name: N/A  . Number of Children: 1  . Years of Education: Some colg   Occupational History  . Retired     Social History Main Topics  .  Smoking status: Never Smoker   . Smokeless tobacco: Never Used  . Alcohol Use: 1.2 oz/week    2 Glasses of wine per week  . Drug Use: No  . Sexual Activity: Not on file   Other Topics Concern  . None   Social History Narrative   1 -2 cups of coffee a day, some tea consumption     His Allergies Are:  Allergies  Allergen Reactions  . Metronidazole Anaphylaxis  . Amoxicillin-Pot Clavulanate Other (See Comments)    UNKNOWN  . Ciprofloxacin Other (See Comments)    UNKNOWN  . Mesalamine     REACTION: Intolerance  :   His Current Medications Are:  Outpatient Encounter Prescriptions as of 06/11/2014  Medication Sig  . aspirin EC 81 MG EC tablet Take 1 tablet (81 mg total) by mouth daily.  Marland Kitchen atorvastatin (LIPITOR) 80 MG tablet Take 1 tablet (80 mg total) by mouth daily at 6 PM.  .  citalopram (CELEXA) 10 MG tablet Take 5 mg by mouth daily.  . fluticasone (FLONASE) 50 MCG/ACT nasal spray   . Misc. Throat Products (BREATHE RIGHT SNORE RELIEF MT) Use as directed 1 strip in the mouth or throat at bedtime.  . nebivolol (BYSTOLIC) 10 MG tablet Take 0.5 tablets (5 mg total) by mouth 2 (two) times daily.  . [DISCONTINUED] citalopram (CELEXA) 40 MG tablet Take 40 mg by mouth daily.   No facility-administered encounter medications on file as of 06/11/2014.  :  Review of Systems:  Out of a complete 14 point review of systems, all are reviewed and negative with the exception of these symptoms as listed below:   Review of Systems  Constitutional: Positive for fatigue.  Eyes:       Blurred vision   Neurological: Positive for headaches.       Daytime sleepiness, wakes up 2x during the night, snores without Breath right strips, witnessed apnea, wakes up tired in the morning sometimes.  Hematological: Bruises/bleeds easily.    Objective:  Neurologic Exam  Physical Exam Physical Examination:   Filed Vitals:   06/11/14 0806  BP: 122/60  Pulse: 68  Resp: 18    General Examination:  The patient is a very pleasant 74 y.o. male in no acute distress. He appears well-developed and well-nourished and well groomed. He is overweight.   HEENT: Normocephalic, atraumatic, pupils are equal, round and reactive to light and accommodation. Funduscopic exam is normal with sharp disc margins noted. Extraocular tracking is good without limitation to gaze excursion or nystagmus noted. Normal smooth pursuit is noted. Hearing is grossly intact. Tympanic membranes are not visualized due to cerumen impaction bilaterally. Face is symmetric with normal facial animation and normal facial sensation. Speech is clear with no dysarthria noted. There is no hypophonia. There is no lip, neck/head, jaw or voice tremor. Neck is supple with full range of passive and active motion. There are no carotid bruits on auscultation. Oropharynx exam reveals: severe mouth dryness, adequate dental hygiene and marked airway crowding, due to large uvula, and thicker soft palate. Mallampati is class III. Tongue protrudes centrally and palate elevates symmetrically. Tonsils are small.    Chest: Clear to auscultation without wheezing, rhonchi or crackles noted.  Heart: S1+S2+0, regular and normal without murmurs, rubs or gallops noted.   Abdomen: Soft, non-tender and non-distended with normal bowel sounds appreciated on auscultation.  Extremities: There is no edema in the distal lower extremities bilaterally, mainly around both ankles. Pedal pulses are intact.  Skin: Warm and dry without trophic changes noted. There are no varicose veins.  Musculoskeletal: exam reveals no obvious joint deformities, tenderness or joint swelling or erythema.   Neurologically:  Mental status: The patient is awake, alert and oriented in all 4 spheres. His memory, attention, language and knowledge are appropriate. There is no aphasia, agnosia, apraxia or anomia. Speech is clear with normal prosody and enunciation. Thought process is linear. Mood is  congruent and affect is normal.  Cranial nerves are as described above under HEENT exam. In addition, shoulder shrug is normal with equal shoulder height noted. Motor exam: Normal bulk, strength and tone is noted. There is no drift, tremor or rebound. Romberg is negative. Reflexes are 2+ in the UEs and knees and absent in both ankles. Toes are downgoing bilaterally. Fine motor skills are intact with normal finger taps, normal hand movements, normal rapid alternating patting, normal foot taps and normal foot agility.  Cerebellar testing shows no dysmetria or intention  tremor on finger to nose testing. Heel to shin is unremarkable bilaterally. There is no truncal or gait ataxia.  Sensory exam is intact to light touch, pinprick, vibration, temperature sense in the upper and lower extremities, perhaps with slightly reduced vibration sense in the distal LEs.  Gait, station and balance are unremarkable. No veering to one side is noted. No leaning to one side is noted. Posture is age-appropriate and stance is narrow based. No problems turning are noted. He turns en bloc. Tandem walk is unremarkable. Intact toe and heel stance is noted.               Assessment and Plan:   In summary, Matthew Luna is a very pleasant 80 hypertension, overweight state, and recent diagnosis of TIA who presents for reevaluation of his severe obstructive sleep apnea. I met him last year. He had a sleep study last year but turned and his CPAP as he felt uncomfortable with it. I did not see him back for follow-up until now. He has been placed on aspirin and Plavix for 3 months after which he is supposed to continue with Plavix only. He has noticed a mild bruising on his hands. He feels at baseline. He has been using Breathe Right strips for snoring but I explained his sleep study results to him and also explained to him that we can reevaluate his sleep apnea and he can also use his Breathe Right strips for the baseline part of the sleep  study if he wishes. If he has sleep apnea he should be on treatment with CPAP machine which would be the most efficient and successful treatment option. He is willing to try it again. I've asked him to call your office for refills on his Lipitor and Plavix and I will bridge of 30 day prescription for him so he does not run out. Physical exam is stable and nonfocal at this time and I reassured him. We talked about secondary stroke prevention today and the risks and ramifications of untreated moderate to severe obstructive sleep apnea, especially with respect to developing cardiovascular disease down the Road, including congestive heart failure, difficult to treat hypertension, cardiac arrhythmias, or stroke. Even type 2 diabetes has in part been linked to untreated OSA. We talked about trying to maintain a healthy lifestyle in general, as well as the importance of weight control. I encouraged the patient to eat healthy, exercise daily and keep well hydrated, to keep a scheduled bedtime and wake time routine, to not skip any meals and eat healthy snacks in between meals.  I recommended the following at this time: sleep study with potential positive airway pressure titration. I answered all his questions today and the patient was in agreement. I would like to see him back after the sleep study is completed and encouraged him to call with any interim questions, concerns, problems or updates.  I spent 25 minutes in total face-to-face time with the patient, more than 50% of which was spent in counseling and coordination of care, reviewing test results, reviewing medication and discussing or reviewing the diagnosis of TIA and OSA, the prognosis and treatment options.

## 2014-06-26 ENCOUNTER — Other Ambulatory Visit: Payer: Self-pay | Admitting: Neurology

## 2014-06-26 ENCOUNTER — Ambulatory Visit (INDEPENDENT_AMBULATORY_CARE_PROVIDER_SITE_OTHER): Payer: PPO | Admitting: Family Medicine

## 2014-06-26 ENCOUNTER — Encounter: Payer: Self-pay | Admitting: Family Medicine

## 2014-06-26 VITALS — BP 100/60 | HR 58 | Temp 97.8°F | Resp 16 | Ht 74.0 in | Wt 229.0 lb

## 2014-06-26 DIAGNOSIS — R5382 Chronic fatigue, unspecified: Secondary | ICD-10-CM | POA: Diagnosis not present

## 2014-06-26 LAB — CBC WITH DIFFERENTIAL/PLATELET
Basophils Absolute: 0.1 10*3/uL (ref 0.0–0.1)
Basophils Relative: 1 % (ref 0–1)
Eosinophils Absolute: 0.9 10*3/uL — ABNORMAL HIGH (ref 0.0–0.7)
Eosinophils Relative: 13 % — ABNORMAL HIGH (ref 0–5)
HCT: 39.7 % (ref 39.0–52.0)
Hemoglobin: 13.4 g/dL (ref 13.0–17.0)
Lymphocytes Relative: 30 % (ref 12–46)
Lymphs Abs: 2.2 10*3/uL (ref 0.7–4.0)
MCH: 31.4 pg (ref 26.0–34.0)
MCHC: 33.8 g/dL (ref 30.0–36.0)
MCV: 93 fL (ref 78.0–100.0)
MPV: 9.7 fL (ref 8.6–12.4)
Monocytes Absolute: 0.5 10*3/uL (ref 0.1–1.0)
Monocytes Relative: 7 % (ref 3–12)
Neutro Abs: 3.5 10*3/uL (ref 1.7–7.7)
Neutrophils Relative %: 49 % (ref 43–77)
Platelets: 235 10*3/uL (ref 150–400)
RBC: 4.27 MIL/uL (ref 4.22–5.81)
RDW: 14.6 % (ref 11.5–15.5)
WBC: 7.2 10*3/uL (ref 4.0–10.5)

## 2014-06-27 LAB — TSH: TSH: 6.054 u[IU]/mL — ABNORMAL HIGH (ref 0.350–4.500)

## 2014-06-27 LAB — TESTOSTERONE: Testosterone: 234 ng/dL — ABNORMAL LOW (ref 300–890)

## 2014-06-27 LAB — VITAMIN B12: Vitamin B-12: 363 pg/mL (ref 211–911)

## 2014-06-30 ENCOUNTER — Encounter: Payer: Self-pay | Admitting: Family Medicine

## 2014-06-30 NOTE — Progress Notes (Signed)
Subjective:    Patient ID: Matthew Luna, male    DOB: [birth year], 74 y.o.   MRN: [id number]  HPI Patient presents complaining of severe fatigue and hypersomnolence. He attributes this to his new medication. Patient recently had a CVA and was started on Plavix in addition to aspirin. He will be on dual antiplatelets therapy through July and then switch to Plavix alone indefinitely. He was also started on high-dose Lipitor. Patient believes these medications are causing him to feel extremely tired. Patient states that he can fall asleep if he sits still for any amount of time. He can fall asleep after eating meals. He never feels like he is getting an adequate amount of sleep at night.  He denies headache. He does have a history of obstructive sleep apnea. He has not been able to tolerate CPAP. He was supposed to be seeing [facility] to schedule another sleep study for reevaluation but there apparently has been some logistical problems and he has been unable to have this scheduled. Past Medical History  Diagnosis Date  . Mononeuritis of unspecified site   . Actinic keratosis   . Nonspecific abnormal results of thyroid function study   . Hypertrophy of prostate without urinary obstruction and other lower urinary tract symptoms (LUTS)   . Unspecified essential hypertension   . Stricture and stenosis of esophagus   . Left sided ulcerative (chronic) colitis   . Duodenitis without mention of hemorrhage   . Esophageal reflux   . Diverticulosis of colon (without mention of hemorrhage)   . Prostate cancer     cryotherapy 2011 [facility])  . Sleep apnea     ahi 75, cpap 13    Past Surgical History  Procedure Laterality Date  . Cholecystectomy    . Prostate surgery    . Esophagogastroduodenoscopy  03/22/2011    Procedure: ESOPHAGOGASTRODUODENOSCOPY (EGD);  Surgeon: Inda Castle, MD;  Location: [facility];  Service: Endoscopy;  Laterality: N/A;  . Balloon dilation  03/22/2011    Procedure:  BALLOON DILATION;  Surgeon: Inda Castle, MD;  Location: [facility];  Service: Endoscopy;  Laterality: N/A;  kelly/ebp   Current Outpatient Prescriptions on File Prior to Visit  Medication Sig Dispense Refill  . aspirin EC 81 MG EC tablet Take 1 tablet (81 mg total) by mouth daily.    Marland Kitchen atorvastatin (LIPITOR) 80 MG tablet Take 1 tablet (80 mg total) by mouth daily at 6 PM. 30 tablet 0  . citalopram (CELEXA) 10 MG tablet Take 5 mg by mouth daily.    . clopidogrel (PLAVIX) 75 MG tablet Take 1 tablet (75 mg total) by mouth daily. 30 tablet 0  . fluticasone (FLONASE) 50 MCG/ACT nasal spray   11  . Misc. Throat Products (BREATHE RIGHT SNORE RELIEF MT) Use as directed 1 strip in the mouth or throat at bedtime.    . nebivolol (BYSTOLIC) 10 MG tablet Take 0.5 tablets (5 mg total) by mouth 2 (two) times daily. (Patient taking differently: Take 10 mg by mouth daily. ) 30 tablet 5   No current facility-administered medications on file prior to visit.   Allergies  Allergen Reactions  . Metronidazole Anaphylaxis  . Amoxicillin-Pot Clavulanate Other (See Comments)    UNKNOWN  . Ciprofloxacin Other (See Comments)    UNKNOWN  . Mesalamine     REACTION: Intolerance   History   Social History  . Marital Status: Married    Spouse Name: N/A  . Number of Children: 1  .  Years of Education: Some colg   Occupational History  . Retired     Social History Main Topics  . Smoking status: Never Smoker   . Smokeless tobacco: Never Used  . Alcohol Use: 1.2 oz/week    2 Glasses of wine per week  . Drug Use: No  . Sexual Activity: Not on file   Other Topics Concern  . Not on file   Social History Narrative   1 -2 cups of coffee a day, some tea consumption       Review of Systems  All other systems reviewed and are negative.      Objective:   Physical Exam  Constitutional: He is oriented to person, place, and time. He appears well-developed and well-nourished. No distress.  Neck: No  thyromegaly present.  Cardiovascular: Normal rate, regular rhythm and normal heart sounds.   No murmur heard. Pulmonary/Chest: Effort normal and breath sounds normal. No respiratory distress. He has no wheezes. He has no rales.  Abdominal: Soft. Bowel sounds are normal.  Lymphadenopathy:    He has no cervical adenopathy.  Neurological: He is alert and oriented to person, place, and time. He has normal reflexes. He displays normal reflexes. No cranial nerve deficit. He exhibits normal muscle tone. Coordination normal.  Skin: He is not diaphoretic.  Vitals reviewed.         Assessment & Plan:  Chronic fatigue - Plan: CBC with Differential/Platelet, TSH, Testosterone, Vitamin B12  I explained to the patient that I do not believe Plavix nor Lipitor is causing him to have hypersomnolence and fatigue. I believe it is due to his poorly controlled sleep apnea. I will check a CBC, TSH, vitamin B12, and a testosterone level to evaluate for other causes of fatigue. However if these are relatively normal, I believe the patient needs to pursue therapy for his obstructive sleep apnea in order to improve his symptoms. I tried to explain this is best I could to the patient. I will see if I can facilitate scheduling the CPAP study through [facility] since the patient is having a difficult time scheduling this on his own.

## 2014-07-02 ENCOUNTER — Telehealth: Payer: Self-pay | Admitting: *Deleted

## 2014-07-02 NOTE — Telephone Encounter (Signed)
-----   Message from Susy Frizzle, MD sent at 07/02/2014  7:00 AM EDT ----- Thanks. ----- Message -----    From: Maureen Chatters, CMA    Sent: 07/01/2014   3:12 PM      To: Susy Frizzle, MD  This is in the process of getting scheduled, the scheduler at [facility] is waiting on authorization from pt's insurance and should be scheduled this week. ----- Message -----    From: Susy Frizzle, MD    Sent: 06/30/2014   7:30 AM      To: Maureen Chatters, CMA  Patient has been unable to reach [facility] neurolog[facility] in order to schedule his sleep study. He has already seen the doctor there. However the patient has been unable to reach their scheduler.  Can we contact their office and try to help get this scheduled because this has gone on for quite some time and I fear it will not get scheduled unless we take responsibility.

## 2014-07-08 ENCOUNTER — Ambulatory Visit (INDEPENDENT_AMBULATORY_CARE_PROVIDER_SITE_OTHER): Payer: PPO | Admitting: Neurology

## 2014-07-08 VITALS — BP 111/70 | HR 69

## 2014-07-08 DIAGNOSIS — G4733 Obstructive sleep apnea (adult) (pediatric): Secondary | ICD-10-CM | POA: Diagnosis not present

## 2014-07-08 DIAGNOSIS — G4761 Periodic limb movement disorder: Secondary | ICD-10-CM

## 2014-07-08 DIAGNOSIS — G479 Sleep disorder, unspecified: Secondary | ICD-10-CM

## 2014-07-08 NOTE — Sleep Study (Signed)
Please see the scanned sleep study interpretation located in the Procedure tab within the Chart Review section. 

## 2014-07-09 ENCOUNTER — Other Ambulatory Visit: Payer: Self-pay | Admitting: Neurology

## 2014-07-09 NOTE — Telephone Encounter (Signed)
Last OV note says: please ask Dr. Samella Parr for refills

## 2014-07-10 ENCOUNTER — Other Ambulatory Visit: Payer: Self-pay | Admitting: Family Medicine

## 2014-07-14 ENCOUNTER — Telehealth: Payer: Self-pay | Admitting: *Deleted

## 2014-07-14 ENCOUNTER — Telehealth: Payer: Self-pay | Admitting: Neurology

## 2014-07-14 DIAGNOSIS — G4733 Obstructive sleep apnea (adult) (pediatric): Secondary | ICD-10-CM

## 2014-07-14 NOTE — Telephone Encounter (Signed)
LMVM for pt to return call for test result (on study done in office) at his convenience.

## 2014-07-14 NOTE — Telephone Encounter (Signed)
Patient seen on 06/11/14, split night study on 07/08/14.  Ins: Health Team Adv. Medicare Diana:   Please call and notify patient that the recent sleep study confirmed the diagnosis of severe OSA. He did well with CPAP during the study with significant improvement of the respiratory events. Therefore, I would like start the patient on CPAP therapy at home by prescribing a machine for home use. I placed the order in the chart. The patient will need a follow up appointment with me in 8 to 10 weeks post set up that has to be scheduled; please go ahead and schedule while you have the patient on the phone and make sure patient understands the importance of keeping this window for the FU appointment, as it is often an insurance requirement and failing to adhere to this may result in losing coverage for sleep apnea treatment. 15 min follow-up should suffice, unless there is a 30 min FU slot available.  Please re-enforce the importance of compliance with treatment and the need for [place] to monitor compliance data - again an insurance requirement and good feedback for the patient as far as how they are doing.  Also remind patient, that any upcoming CPAP machine or mask issues, should be first addressed with the DME company. Please ask if patient has a preference regarding DME company.  Please arrange for CPAP set up at home through a DME company of patient's choice - once you have spoken to the patient - and faxed/routed report to PCP and referring MD (if other than PCP), you can close this encounter, thanks,   Star Age, MD, PhD [facility] ([place])

## 2014-07-14 NOTE — Telephone Encounter (Addendum)
I spoke to pt and relayed the results of sleep study as per below.    He had cpap machine in past but could not remember DME co.  I told him that would send to [facility].   I made f/u appt with him 09-22-14 at 1100 (be here 1045) and bring machine with him.  Pt verbalized understanding.  I also faxed Dr. Dennard Schaumann , pcp with report.

## 2014-07-14 NOTE — Telephone Encounter (Signed)
Left detailed vm informing patient that his hospital office fu has been rescheduled with Dr Felecia Shelling for 07/27/14 at 9:20 am. Requested he arrive 15 min early. Left this caller's name, number for questions.

## 2014-07-16 ENCOUNTER — Encounter: Payer: Self-pay | Admitting: Family Medicine

## 2014-07-23 ENCOUNTER — Institutional Professional Consult (permissible substitution): Payer: PPO | Admitting: Neurology

## 2014-07-27 ENCOUNTER — Ambulatory Visit (INDEPENDENT_AMBULATORY_CARE_PROVIDER_SITE_OTHER): Payer: PPO | Admitting: Neurology

## 2014-07-27 ENCOUNTER — Other Ambulatory Visit: Payer: Self-pay | Admitting: *Deleted

## 2014-07-27 ENCOUNTER — Encounter: Payer: Self-pay | Admitting: Neurology

## 2014-07-27 VITALS — BP 136/84 | HR 60 | Resp 16 | Ht 74.0 in | Wt 229.0 lb

## 2014-07-27 DIAGNOSIS — R350 Frequency of micturition: Secondary | ICD-10-CM | POA: Diagnosis not present

## 2014-07-27 DIAGNOSIS — G4730 Sleep apnea, unspecified: Secondary | ICD-10-CM

## 2014-07-27 DIAGNOSIS — G458 Other transient cerebral ischemic attacks and related syndromes: Secondary | ICD-10-CM

## 2014-07-27 MED ORDER — TRAZODONE HCL 50 MG PO TABS: ORAL_TABLET | ORAL | Status: DC

## 2014-07-27 NOTE — Progress Notes (Signed)
[facility]  PATIENT: Matthew Luna DOB: Jul 22, 1940     HISTORICAL  CHIEF COMPLAINT:  Chief Complaint  Patient presents with  . Sleep Apnea    Seen by Dr. Rexene Alberts 06-11-14 for osa, tia.  Is here today to review results of sleep study done 07-08-14.  Sts. he was not able to tolerate cpap in the past and turned his machine back in.  He denies new stroke sx. and sts. is taking meds as rx'd/fim   . Transient Ischemic Attack    HISTORY OF PRESENT ILLNESS:  Matthew Luna is a 74 year old man with history of recent TIA who has severe sleep apnea. He has had a recent PSG split night study and is here for follow-up.  In his presence, I reviewed the results of his PSG split night study from 07/03/2014. He has severe OSA with an AHI = 73 and severe periodic limb movements of sleep with index of 145. Before using CPAP, he had no N3 or REM sleep.   He was titrated to a pressure +14 cm. They had difficulty getting a mask to fit that would not leak and he ended up using an XL  F&K Forma mask.  He with CPAP he had improvement of AHI to 6. Additionally he had REM and and N3 sleep.  He felt better the next day.     Besides OSA, he also reports that he has nocturia 3 times a night and that the urinary frequency is bothersome to him.  We had a long discussion about CPAP and how it may lower the risk of heart attack strokes and other medical problems.   Additionally, it may make him feel better during the daytime and decrease his nocturia. Marland Kitchen   He is willing to try it one more time and we will set up DME.   According to the technologist notes, he sleeps with his mouth wide open and therefore fullface mask needed to be used. Matthew Luna would like a smaller mask. Because his stomach with his mouth open, he may not be able to use a nasal mask unless he also uses a chin strap.  He had a TIA couple months ago and is on Plavix and aspirin. He is getting a lot of bruising in the arms. Total discharge  that he should take the Plavix overlapping with the aspirin for 3 months.  He has not had any slurred speech, visual loss, numbness, weakness coordination problems or other TIA symptoms    ROS:  Out of a complete 14 system review of symptoms, the patient complains only of the following symptoms, and all other reviewed systems are negative.  Daytime sleepiness, Urinary frequency   ALLERGIES: Allergies  Allergen Reactions  . Metronidazole Anaphylaxis  . Amoxicillin-Pot Clavulanate Other (See Comments)    UNKNOWN  . Ciprofloxacin Other (See Comments)    UNKNOWN  . Mesalamine     REACTION: Intolerance    HOME MEDICATIONS:  Current outpatient prescriptions:  .  aspirin EC 81 MG EC tablet, Take 1 tablet (81 mg total) by mouth daily., Disp: , Rfl:  .  atorvastatin (LIPITOR) 80 MG tablet, TAKE 1 TABLET (80 MG TOTAL) BY MOUTH DAILY AT 6 PM., Disp: 30 tablet, Rfl: 11 .  citalopram (CELEXA) 10 MG tablet, Take 5 mg by mouth daily., Disp: , Rfl:  .  clopidogrel (PLAVIX) 75 MG tablet, TAKE 1 TABLET (75 MG TOTAL) BY MOUTH DAILY., Disp: 30 tablet, Rfl: 11 .  Misc. Throat Products (  BREATHE RIGHT SNORE RELIEF MT), Use as directed 1 strip in the mouth or throat at bedtime., Disp: , Rfl:  .  nebivolol (BYSTOLIC) 10 MG tablet, Take 0.5 tablets (5 mg total) by mouth 2 (two) times daily. (Patient taking differently: Take 10 mg by mouth daily. ), Disp: 30 tablet, Rfl: 5 .  fluticasone (FLONASE) 50 MCG/ACT nasal spray, , Disp: , Rfl: 11 .  traZODone (DESYREL) 50 MG tablet, Use 1/2 to 1 po nightly, Disp: 30 tablet, Rfl: 5  PAST MEDICAL HISTORY: Past Medical History  Diagnosis Date  . Mononeuritis of unspecified site   . Actinic keratosis   . Nonspecific abnormal results of thyroid function study   . Hypertrophy of prostate without urinary obstruction and other lower urinary tract symptoms (LUTS)   . Unspecified essential hypertension   . Stricture and stenosis of esophagus   . Left sided ulcerative  (chronic) colitis   . Duodenitis without mention of hemorrhage   . Esophageal reflux   . Diverticulosis of colon (without mention of hemorrhage)   . Prostate cancer     cryotherapy 2011 [facility])  . Sleep apnea     ahi 75, cpap 14    PAST SURGICAL HISTORY: Past Surgical History  Procedure Laterality Date  . Cholecystectomy    . Prostate surgery    . Esophagogastroduodenoscopy  03/22/2011    Procedure: ESOPHAGOGASTRODUODENOSCOPY (EGD);  Surgeon: Inda Castle, MD;  Location: [facility];  Service: Endoscopy;  Laterality: N/A;  . Balloon dilation  03/22/2011    Procedure: BALLOON DILATION;  Surgeon: Inda Castle, MD;  Location: [facility];  Service: Endoscopy;  Laterality: N/A;  kelly/ebp    FAMILY HISTORY: Family History  Problem Relation Age of Onset  . Cancer    . Anesthesia problems Neg Hx   . Hypotension Neg Hx   . Malignant hyperthermia Neg Hx   . Pseudochol deficiency Neg Hx   . Cancer Mother   . Snoring Father     SOCIAL HISTORY:  History   Social History  . Marital Status: Married    Spouse Name: N/A  . Number of Children: 1  . Years of Education: Some colg   Occupational History  . Retired     Social History Main Topics  . Smoking status: Never Smoker   . Smokeless tobacco: Never Used  . Alcohol Use: 1.2 oz/week    2 Glasses of wine per week  . Drug Use: No  . Sexual Activity: Not on file   Other Topics Concern  . Not on file   Social History Narrative   1 -2 cups of coffee a day, some tea consumption      PHYSICAL EXAM  Filed Vitals:   07/27/14 0921  BP: 136/84  Pulse: 60  Resp: 16  Height: 6\' 2"  (1.88 m)  Weight: 229 lb (103.874 kg)    Body mass index is 29.39 kg/(m^2).   General: The patient is well-developed and well-nourished and in no acute distress  Head/Neck: The neck is supple, no carotid bruits are noted.  The neck is nontender.   Tonsils are present, soft palate is mildly elongated.  Skin: Extremities are without  significant edema.  Musculoskeletal:  Back is nontender  Neurologic Exam  Mental status: The patient is alert and oriented x 3 at the time of the examination. The patient has apparent normal recent and remote memory, with an apparently normal attention span and concentration ability.   Speech is normal.  Cranial nerves:  Extraocular movements are full.  Facial strength is normal.  Trapezius and sternocleidomastoid strength is normal. No dysarthria is noted.  The tongue is midline, and the patient has symmetric elevation of the soft palate. No obvious hearing deficits are noted.  Motor:  Muscle bulk is normal.   Tone is normal. Strength is  5 / 5 in all 4 extremities.   Sensory: Sensory testing is intact to   touch in all 4 extremities.  Coordination: Cerebellar testing reveals good finger-nose-finger and heel-to-shin bilaterally.  Gait and station: Station is normal.   Gait is normal. Tandem gait is normal.   Reflexes: Deep tendon reflexes are symmetric and normal bilaterally.     DIAGNOSTIC DATA (LABS, IMAGING, TESTING) - I reviewed patient records, labs, notes, testing and imaging myself where available.  Lab Results  Component Value Date   WBC 7.2 06/26/2014   HGB 13.4 06/26/2014   HCT 39.7 06/26/2014   MCV 93.0 06/26/2014   PLT 235 06/26/2014      Component Value Date/Time   NA 137 05/15/2014 1123   K 4.2 05/15/2014 1123   CL 104 05/15/2014 1123   CO2 27 05/15/2014 1123   GLUCOSE 99 05/15/2014 1123   BUN 24* 05/15/2014 1123   CREATININE 1.44* 05/15/2014 1123   CREATININE 1.60* 05/12/2014 0711   CALCIUM 9.2 05/15/2014 1123   PROT 6.1 05/15/2014 1123   ALBUMIN 3.7 05/15/2014 1123   AST 18 05/15/2014 1123   ALT 15 05/15/2014 1123   ALKPHOS 82 05/15/2014 1123   BILITOT 0.6 05/15/2014 1123   GFRNONAA 48* 05/15/2014 1123   GFRNONAA 41* 05/12/2014 0711   GFRAA 56* 05/15/2014 1123   GFRAA 48* 05/12/2014 0711   Lab Results  Component Value Date   CHOL 222*  05/11/2014   HDL 34* 05/11/2014   LDLCALC 170* 05/11/2014   TRIG 91 05/11/2014   CHOLHDL 6.5 05/11/2014   Lab Results  Component Value Date   HGBA1C 6.0* 05/11/2014   Lab Results  Component Value Date   VITAMINB12 363 06/26/2014   Lab Results  Component Value Date   TSH 6.054* 06/26/2014       ASSESSMENT AND PLAN  Other specified transient cerebral ischemias  Sleep apnea - Plan: For home use only DME continuous positive airway pressure (CPAP)  Urinary frequency   1.  We will set up CPAP +14 through a DME company. He had used a fullface mask in the lab but would like to use a smaller mask. Since he sleeps with his mouth open, he would need to use a chinstrap if he does so.     2.   In order to help him fall asleep better with CPAP, which was a problem the last time he tried, I will write for trazodone 50 mg by mouth daily at bedtime. 3.  He will continue both aspirin and Plavix for another month before going on just aspirin. 4.  He will follow up in 3 months and will see Dr. Rexene Alberts at that time.   Brelan Hannen A. Felecia Shelling, MD, PhD 5/99/3570, 1:77 AM Certified in Neurology, Clinical Neurophysiology, Sleep Medicine, Pain Medicine and Neuroimaging  [facility] [address], [place], [place] [address] [phone]

## 2014-07-31 ENCOUNTER — Ambulatory Visit (INDEPENDENT_AMBULATORY_CARE_PROVIDER_SITE_OTHER): Payer: PPO | Admitting: Family Medicine

## 2014-07-31 ENCOUNTER — Encounter: Payer: Self-pay | Admitting: Family Medicine

## 2014-07-31 VITALS — BP 126/90 | HR 58 | Temp 97.6°F | Resp 16 | Ht 74.0 in | Wt 228.0 lb

## 2014-07-31 DIAGNOSIS — Z8546 Personal history of malignant neoplasm of prostate: Secondary | ICD-10-CM | POA: Diagnosis not present

## 2014-07-31 DIAGNOSIS — R5382 Chronic fatigue, unspecified: Secondary | ICD-10-CM

## 2014-07-31 DIAGNOSIS — R3912 Poor urinary stream: Secondary | ICD-10-CM | POA: Diagnosis not present

## 2014-07-31 DIAGNOSIS — N400 Enlarged prostate without lower urinary tract symptoms: Secondary | ICD-10-CM

## 2014-07-31 LAB — URINALYSIS, ROUTINE W REFLEX MICROSCOPIC
Bilirubin Urine: NEGATIVE
Glucose, UA: NEGATIVE mg/dL
Hgb urine dipstick: NEGATIVE
Ketones, ur: NEGATIVE mg/dL
Leukocytes, UA: NEGATIVE
Nitrite: NEGATIVE
Protein, ur: NEGATIVE mg/dL
Specific Gravity, Urine: >1.03 — ABNORMAL HIGH (ref 1.005–1.030)
Urobilinogen, UA: 0.2 mg/dL (ref 0.0–1.0)
pH: 5.5 (ref 5.0–8.0)

## 2014-07-31 LAB — BASIC METABOLIC PANEL
BUN: 21 mg/dL (ref 6–23)
CO2: 27 mEq/L (ref 19–32)
Calcium: 9.1 mg/dL (ref 8.4–10.5)
Chloride: 104 mEq/L (ref 96–112)
Creat: 1.51 mg/dL — ABNORMAL HIGH (ref 0.50–1.35)
Glucose, Bld: 72 mg/dL (ref 70–99)
Potassium: 4.6 mEq/L (ref 3.5–5.3)
Sodium: 142 mEq/L (ref 135–145)

## 2014-07-31 MED ORDER — TAMSULOSIN HCL 0.4 MG PO CAPS: 0.4000 mg | ORAL_CAPSULE | Freq: Every day | ORAL | Status: DC

## 2014-07-31 NOTE — Patient Instructions (Signed)
Continue current medications We will call with lab results  Try flomax at bedtime  F/U as needed

## 2014-07-31 NOTE — Progress Notes (Signed)
Patient ID: Matthew Luna, male   DOB: [birth year], 74 y.o.   MRN: [id number]   Subjective:    Patient ID: Matthew Luna, male    DOB: 1940-04-27, 74 y.o.   MRN: [id number]  Patient presents for C/O of weak stream with urination; Dizziness; and Medication Management  Patient here to discuss his medications. For the past few months he has complained to his primary care provider as well as his neurologist that his medications make him feel dizzy headed and fatigued. This is a chronic complaint. This been worked up extensively. He had a recent TIA and stenosis of his left vertebral artery. He has been given trials off of his medications his blood pressure medicines has been changed multiple times however this is still a chronic complaint. He states he just does not want to be on all these medications. He is now concerned as he is at urinary frequency with a very weak stream and dribbling for the past few months. On review of his chart he had his history of prostate cancer as well as BPH. He is not on any medications for these symptoms. He denies any dysuria denies any blood in the stool denies any constipation  Review Of Systems:  GEN-+ fatigue, fever, weight loss,weakness, recent illness HEENT- denies eye drainage, change in vision, nasal discharge, CVS- denies chest pain, palpitations RESP- denies SOB, cough, wheeze ABD- denies N/V, change in stools, abd pain GU- denies dysuria, hematuria, +dribbling, incontinence MSK- denies joint pain, muscle aches, injury Neuro- denies headache, dizziness, syncope, seizure activity       Objective:    BP 126/90 mmHg  Pulse 58  Temp(Src) 97.6 F (36.4 C) (Oral)  Resp 16  Ht 6\' 2"  (1.88 m)  Wt 228 lb (103.42 kg)  BMI 29.26 kg/m2 GEN- NAD, alert and oriented x3 Heent- Perrl, EOMI, non icteric pink cojunctiva, oropharynx clear Neck- Supple, no thyromegaly CVS- RRR, no murmur RESP-CTAB ABD-NABS,soft,NT,ND, no CVA tenderness Rectum- normal tone  prostate smooth, enlarged, FOBT neg EXT- No edema Pulses- Radial, 2+        Assessment & Plan:      Problem List Items Addressed This Visit    Chronic fatigue    likley MTF has been worked up extensively by PCP and seen by neurology, tried to give reassurance about his medications and importance of these meds with recent CVA      BPH (benign prostatic hypertrophy)    Trial of flomax, UA of sign of infection, PSA also to be done with history of prostate cancerr      Relevant Medications   tamsulosin (FLOMAX) 0.4 MG CAPS capsule   Other Relevant Orders   PSA, Medicare (Completed)    Other Visit Diagnoses    Weak urinary stream    -  Primary    Relevant Orders    Urinalysis, Routine w reflex microscopic (not at [facility]) (Completed)    Basic metabolic panel (Completed)    H/O prostate cancer        Relevant Orders    PSA, Medicare (Completed)       Note: This dictation was prepared with Dragon dictation along with smaller phrase technology. Any transcriptional errors that result from this process are unintentional.

## 2014-08-01 LAB — PSA, MEDICARE: PSA: 0.06 ng/mL (ref <=4.00)

## 2014-08-03 NOTE — Assessment & Plan Note (Signed)
likley MTF has been worked up extensively by PCP and seen by neurology, tried to give reassurance about his medications and importance of these meds with recent CVA

## 2014-08-03 NOTE — Assessment & Plan Note (Signed)
Trial of flomax, UA of sign of infection, PSA also to be done with history of prostate cancerr

## 2014-08-05 ENCOUNTER — Institutional Professional Consult (permissible substitution): Payer: PPO | Admitting: Neurology

## 2014-08-05 ENCOUNTER — Telehealth: Payer: Self-pay | Admitting: *Deleted

## 2014-08-05 NOTE — Telephone Encounter (Signed)
Pt called back and aware of lab results

## 2014-08-18 ENCOUNTER — Encounter: Payer: Self-pay | Admitting: Family Medicine

## 2014-08-18 ENCOUNTER — Ambulatory Visit (INDEPENDENT_AMBULATORY_CARE_PROVIDER_SITE_OTHER): Payer: PPO | Admitting: Family Medicine

## 2014-08-18 ENCOUNTER — Telehealth: Payer: Self-pay | Admitting: *Deleted

## 2014-08-18 VITALS — BP 110/90 | HR 60 | Temp 98.2°F | Resp 18 | Ht 74.0 in | Wt 227.0 lb

## 2014-08-18 DIAGNOSIS — Z23 Encounter for immunization: Secondary | ICD-10-CM | POA: Diagnosis not present

## 2014-08-18 DIAGNOSIS — Z8673 Personal history of transient ischemic attack (TIA), and cerebral infarction without residual deficits: Secondary | ICD-10-CM

## 2014-08-18 DIAGNOSIS — G4733 Obstructive sleep apnea (adult) (pediatric): Secondary | ICD-10-CM

## 2014-08-18 LAB — COMPLETE METABOLIC PANEL WITH GFR
ALT: 16 U/L (ref 0–53)
AST: 18 U/L (ref 0–37)
Albumin: 3.9 g/dL (ref 3.5–5.2)
Alkaline Phosphatase: 98 U/L (ref 39–117)
BUN: 24 mg/dL — ABNORMAL HIGH (ref 6–23)
CO2: 27 mEq/L (ref 19–32)
Calcium: 9.8 mg/dL (ref 8.4–10.5)
Chloride: 105 mEq/L (ref 96–112)
Creat: 1.32 mg/dL (ref 0.50–1.35)
GFR, Est African American: 62 mL/min
GFR, Est Non African American: 54 mL/min — ABNORMAL LOW
Glucose, Bld: 87 mg/dL (ref 70–99)
Potassium: 4.8 mEq/L (ref 3.5–5.3)
Sodium: 141 mEq/L (ref 135–145)
Total Bilirubin: 0.8 mg/dL (ref 0.2–1.2)
Total Protein: 6.8 g/dL (ref 6.0–8.3)

## 2014-08-18 LAB — CBC WITH DIFFERENTIAL/PLATELET
Basophils Absolute: 0.1 10*3/uL (ref 0.0–0.1)
Basophils Relative: 1 % (ref 0–1)
Eosinophils Absolute: 1 10*3/uL — ABNORMAL HIGH (ref 0.0–0.7)
Eosinophils Relative: 13 % — ABNORMAL HIGH (ref 0–5)
HCT: 43.5 % (ref 39.0–52.0)
Hemoglobin: 14.6 g/dL (ref 13.0–17.0)
Lymphocytes Relative: 25 % (ref 12–46)
Lymphs Abs: 1.9 10*3/uL (ref 0.7–4.0)
MCH: 32.9 pg (ref 26.0–34.0)
MCHC: 33.6 g/dL (ref 30.0–36.0)
MCV: 98 fL (ref 78.0–100.0)
MPV: 9.1 fL (ref 8.6–12.4)
Monocytes Absolute: 0.6 10*3/uL (ref 0.1–1.0)
Monocytes Relative: 8 % (ref 3–12)
Neutro Abs: 4 10*3/uL (ref 1.7–7.7)
Neutrophils Relative %: 53 % (ref 43–77)
Platelets: 237 10*3/uL (ref 150–400)
RBC: 4.44 MIL/uL (ref 4.22–5.81)
RDW: 15.6 % — ABNORMAL HIGH (ref 11.5–15.5)
WBC: 7.5 10*3/uL (ref 4.0–10.5)

## 2014-08-18 LAB — LIPID PANEL
Cholesterol: 175 mg/dL (ref 0–200)
HDL: 34 mg/dL — ABNORMAL LOW (ref >=40)
LDL Cholesterol: 117 mg/dL — ABNORMAL HIGH (ref 0–99)
Total CHOL/HDL Ratio: 5.1 Ratio
Triglycerides: 120 mg/dL (ref <150)
VLDL: 24 mg/dL (ref 0–40)

## 2014-08-18 NOTE — Progress Notes (Signed)
Subjective:    Patient ID: Matthew Luna, male    DOB: 27-Aug-1940, 74 y.o.   MRN: [id number]  HPI 05/14/14 Patient was recently admitted for a TIA.  Patient was admitted with double vision, right arm numbness, and ataxia. He was diagnosed with a TIA. Symptoms resolve spontaneously after one hour. He did not require TPA. MRA of the neck revealed left vertebral artery high-grade stenosis. Patient was started on dual antiplatelet therapy with aspirin and Plavix for the next 3 months. After July he is to continue Plavix monotherapy indefinitely. Blood pressure fluctuated significantly during the hospitalization. The patient developed prerenal azotemia. He is supposed to have a BMP rechecked as an outpatient. Today he is doing well. His blood pressures well controlled at 110/80. He denies any chest pain shortness of breath or dyspnea on exertion. Patient is still requiring the Breathe Right strips and is not wearing his CPAP machine.  At that time, my plan was: Patient's blood pressure is controlled. I will have the patient repeat his sleep study while wearing his Breathe Right strips to see if his sleep apnea is actually adequately controlled. If not I would strongly encourage the patient to resume CPAP. Patient was found to have an LDL cholesterol of 170. He was started on Lipitor 80 mg a day. He is due for repeat fasting lipid panel in July. He is to continue dual antiplatelet therapy until July. After July he will continue Plavix alone. I will have the patient return to check a CBC and a CMP to monitor his electrolytes and his prerenal azotemia. Recheck the patient in 3 months and await the results of the sleep study  08/18/14 Patient has almost completed 3 months of dual antiplatelets therapy. He is scheduled to discontinue Plavix at the end of July. He is due to recheck his cholesterol today. He denies any myalgias or right upper quadrant pain on Lipitor. His blood pressure is well controlled at 110/90.  Patient has had a sleep study. He needs to wear CPAP with a CWP of 14. This has still not been scheduled to his neurologist. Past Medical History  Diagnosis Date  . Mononeuritis of unspecified site   . Actinic keratosis   . Nonspecific abnormal results of thyroid function study   . Hypertrophy of prostate without urinary obstruction and other lower urinary tract symptoms (LUTS)   . Unspecified essential hypertension   . Stricture and stenosis of esophagus   . Left sided ulcerative (chronic) colitis   . Duodenitis without mention of hemorrhage   . Esophageal reflux   . Diverticulosis of colon (without mention of hemorrhage)   . Prostate cancer     cryotherapy 2011 [facility])  . Sleep apnea     ahi 75, cpap 14   Past Surgical History  Procedure Laterality Date  . Cholecystectomy    . Prostate surgery    . Esophagogastroduodenoscopy  03/22/2011    Procedure: ESOPHAGOGASTRODUODENOSCOPY (EGD);  Surgeon: Inda Castle, MD;  Location: [facility];  Service: Endoscopy;  Laterality: N/A;  . Balloon dilation  03/22/2011    Procedure: BALLOON DILATION;  Surgeon: Inda Castle, MD;  Location: [facility];  Service: Endoscopy;  Laterality: N/A;  kelly/ebp   Current Outpatient Prescriptions on File Prior to Visit  Medication Sig Dispense Refill  . aspirin EC 81 MG EC tablet Take 1 tablet (81 mg total) by mouth daily.    Marland Kitchen atorvastatin (LIPITOR) 80 MG tablet TAKE 1 TABLET (80 MG TOTAL) BY MOUTH  DAILY AT 6 PM. 30 tablet 11  . citalopram (CELEXA) 10 MG tablet Take 5 mg by mouth daily.    . clopidogrel (PLAVIX) 75 MG tablet TAKE 1 TABLET (75 MG TOTAL) BY MOUTH DAILY. 30 tablet 11  . fluticasone (FLONASE) 50 MCG/ACT nasal spray   11  . Misc. Throat Products (BREATHE RIGHT SNORE RELIEF MT) Use as directed 1 strip in the mouth or throat at bedtime.    . nebivolol (BYSTOLIC) 10 MG tablet Take 10 mg by mouth daily.    . tamsulosin (FLOMAX) 0.4 MG CAPS capsule Take 1 capsule (0.4 mg total) by mouth daily  after supper. For prostate 30 capsule 6  . traZODone (DESYREL) 50 MG tablet Use 1/2 to 1 po nightly 30 tablet 5   No current facility-administered medications on file prior to visit.   Allergies  Allergen Reactions  . Metronidazole Anaphylaxis  . Amoxicillin-Pot Clavulanate Other (See Comments)    UNKNOWN  . Ciprofloxacin Other (See Comments)    UNKNOWN  . Mesalamine     REACTION: Intolerance   History   Social History  . Marital Status: Married    Spouse Name: N/A  . Number of Children: 1  . Years of Education: Some colg   Occupational History  . Retired     Social History Main Topics  . Smoking status: Never Smoker   . Smokeless tobacco: Never Used  . Alcohol Use: 1.2 oz/week    2 Glasses of wine per week  . Drug Use: No  . Sexual Activity: Not on file   Other Topics Concern  . Not on file   Social History Narrative   1 -2 cups of coffee a day, some tea consumption       Review of Systems  All other systems reviewed and are negative.      Objective:   Physical Exam  Constitutional: He is oriented to person, place, and time. He appears well-developed and well-nourished.  Neck: Neck supple.  Cardiovascular: Normal rate, regular rhythm, normal heart sounds and intact distal pulses.   No murmur heard. Pulmonary/Chest: Effort normal and breath sounds normal. No respiratory distress. He has no wheezes. He has no rales.  Abdominal: Soft. Bowel sounds are normal. He exhibits no distension. There is no tenderness. There is no rebound and no guarding.  Lymphadenopathy:    He has no cervical adenopathy.  Neurological: He is alert and oriented to person, place, and time. He has normal reflexes. No cranial nerve deficit. He exhibits normal muscle tone. Coordination normal.  Vitals reviewed.         Assessment & Plan:  Need for prophylactic vaccination against Streptococcus pneumoniae (pneumococcus) - Plan: Pneumococcal conjugate vaccine 13-valent IM  History  of CVA (cerebrovascular accident) - Plan: COMPLETE METABOLIC PANEL WITH GFR, Lipid panel, CBC with Differential/Platelet  Obstructive sleep apnea patient received Prevnar 13 today. His blood pressures adequately controlled. I will check a fasting lipid panel to ensure that his cholesterol is less than 70. I will also on tachycardia DME and schedule the patient for a CPAP supplies

## 2014-08-18 NOTE — Telephone Encounter (Signed)
-----   Message from Susy Frizzle, MD sent at 08/18/2014  9:39 AM EDT ----- Can we contact [facility] and the patient's CPAP supplies. He needs CWP 14 cm of water.

## 2014-08-18 NOTE — Telephone Encounter (Signed)
lmtrc to pt, need to know who he is getting supplies from [facility] vs [facility].

## 2014-08-21 ENCOUNTER — Other Ambulatory Visit: Payer: Self-pay | Admitting: Family Medicine

## 2014-08-21 MED ORDER — ROSUVASTATIN CALCIUM 40 MG PO TABS: 40.0000 mg | ORAL_TABLET | Freq: Every day | ORAL | Status: DC

## 2014-08-24 NOTE — Telephone Encounter (Signed)
lmtrc

## 2014-09-10 ENCOUNTER — Telehealth: Payer: Self-pay | Admitting: Family Medicine

## 2014-09-10 MED ORDER — ATORVASTATIN CALCIUM 80 MG PO TABS: 80.0000 mg | ORAL_TABLET | Freq: Every day | ORAL | Status: DC

## 2014-09-10 NOTE — Telephone Encounter (Signed)
The Crestor is to expensive.  Wants to go back on Lipitor.  OK?

## 2014-09-10 NOTE — Telephone Encounter (Signed)
Pt aware and states that the crestor is over $100 and would like to go back on lipitor and will try to eat better. Med sent to pharm.

## 2014-09-10 NOTE — Telephone Encounter (Signed)
LMTRC

## 2014-09-10 NOTE — Telephone Encounter (Signed)
LDL was 117 on lipitor but goal is <70.  Med not getting the job done.  If he still wants to go back on lipitor I will agree but crestor is better.

## 2014-09-22 ENCOUNTER — Ambulatory Visit: Payer: Self-pay | Admitting: Neurology

## 2014-11-03 ENCOUNTER — Ambulatory Visit: Payer: Self-pay | Admitting: Family

## 2014-11-03 ENCOUNTER — Other Ambulatory Visit (HOSPITAL_COMMUNITY): Payer: PPO

## 2014-11-04 ENCOUNTER — Encounter: Payer: Self-pay | Admitting: Family

## 2014-11-06 ENCOUNTER — Encounter: Payer: Self-pay | Admitting: Family

## 2014-11-06 ENCOUNTER — Ambulatory Visit (INDEPENDENT_AMBULATORY_CARE_PROVIDER_SITE_OTHER): Payer: PPO | Admitting: Family

## 2014-11-06 ENCOUNTER — Ambulatory Visit (HOSPITAL_COMMUNITY)
Admission: RE | Admit: 2014-11-06 | Discharge: 2014-11-06 | Disposition: A | Discharge status: Home / Self Care | Payer: PPO | Source: Ambulatory Visit | Attending: Family | Admitting: Family

## 2014-11-06 VITALS — BP 149/93 | HR 46 | Temp 97.1°F | Resp 14 | Ht 75.0 in | Wt 230.0 lb

## 2014-11-06 DIAGNOSIS — I714 Abdominal aortic aneurysm, without rupture, unspecified: Secondary | ICD-10-CM

## 2014-11-06 DIAGNOSIS — I10 Essential (primary) hypertension: Secondary | ICD-10-CM | POA: Diagnosis not present

## 2014-11-06 NOTE — Patient Instructions (Signed)
Abdominal Aortic Aneurysm An aneurysm is a weakened or damaged part of an artery wall that bulges from the normal force of blood pumping through the body. An abdominal aortic aneurysm is an aneurysm that occurs in the lower part of the aorta, the main artery of the body.  The major concern with an abdominal aortic aneurysm is that it can enlarge and burst (rupture) or blood can flow between the layers of the wall of the aorta through a tear (aorticdissection). Both of these conditions can cause bleeding inside the body and can be life threatening unless diagnosed and treated promptly. CAUSES  The exact cause of an abdominal aortic aneurysm is unknown. Some contributing factors are:   A hardening of the arteries caused by the buildup of fat and other substances in the lining of a blood vessel (arteriosclerosis).  Inflammation of the walls of an artery (arteritis).   Connective tissue diseases, such as Marfan syndrome.   Abdominal trauma.   An infection, such as syphilis or staphylococcus, in the wall of the aorta (infectious aortitis) caused by bacteria. RISK FACTORS  Risk factors that contribute to an abdominal aortic aneurysm may include:  Age older than 60 years.   High blood pressure (hypertension).  Male gender.  Ethnicity (white race).  Obesity.  Family history of aneurysm (first degree relatives only).  Tobacco use. PREVENTION  The following healthy lifestyle habits may help decrease your risk of abdominal aortic aneurysm:  Quitting smoking. Smoking can raise your blood pressure and cause arteriosclerosis.  Limiting or avoiding alcohol.  Keeping your blood pressure, blood sugar level, and cholesterol levels within normal limits.  Decreasing your salt intake. In somepeople, too much salt can raise blood pressure and increase your risk of abdominal aortic aneurysm.  Eating a diet low in saturated fats and cholesterol.  Increasing your fiber intake by including  whole grains, vegetables, and fruits in your diet. Eating these foods may help lower blood pressure.  Maintaining a healthy weight.  Staying physically active and exercising regularly. SYMPTOMS  The symptoms of abdominal aortic aneurysm may vary depending on the size and rate of growth of the aneurysm.Most grow slowly and do not have any symptoms. When symptoms do occur, they may include:  Pain (abdomen, side, lower back, or groin). The pain may vary in intensity. A sudden onset of severe pain may indicate that the aneurysm has ruptured.  Feeling full after eating only small amounts of food.  Nausea or vomiting or both.  Feeling a pulsating lump in the abdomen.  Feeling faint or passing out. DIAGNOSIS  Since most unruptured abdominal aortic aneurysms have no symptoms, they are often discovered during diagnostic exams for other conditions. An aneurysm may be found during the following procedures:  Ultrasonography (A one-time screening for abdominal aortic aneurysm by ultrasonography is also recommended for all men aged 65-75 years who have ever smoked).  X-ray exams.  A computed tomography (CT).  Magnetic resonance imaging (MRI).  Angiography or arteriography. TREATMENT  Treatment of an abdominal aortic aneurysm depends on the size of your aneurysm, your age, and risk factors for rupture. Medication to control blood pressure and pain may be used to manage aneurysms smaller than 6 cm. Regular monitoring for enlargement may be recommended by your caregiver if:  The aneurysm is 3-4 cm in size (an annual ultrasonography may be recommended).  The aneurysm is 4-4.5 cm in size (an ultrasonography every 6 months may be recommended).  The aneurysm is larger than 4.5 cm in   size (your caregiver may ask that you be examined by a vascular surgeon). If your aneurysm is larger than 6 cm, surgical repair may be recommended. There are two main methods for repair of an aneurysm:   Endovascular  repair (a minimally invasive surgery). This is done most often.  Open repair. This method is used if an endovascular repair is not possible.   This information is not intended to replace advice given to you by your health care provider. Make sure you discuss any questions you have with your health care provider.   Document Released: 10/26/2004 Document Revised: 05/13/2012 Document Reviewed: 02/16/2012 Elsevier Interactive Patient Education 2016 [employer]  

## 2014-11-06 NOTE — Progress Notes (Signed)
Filed Vitals:   11/06/14 0906 11/06/14 0911  BP: 141/88 149/93  Pulse: 49 46  Temp: 97.1 F (36.2 C)   TempSrc: Oral   Resp: 14   Height: 6\' 3"  (1.905 m)   Weight: 230 lb (104.327 kg)   SpO2: 98%

## 2014-11-06 NOTE — Progress Notes (Signed)
[facility] [place]  Established Abdominal Aortic Aneurysm  History of Present Illness  Matthew Luna is a 74 y.o. (1940-03-21) male patient of Dr. Kellie Simmering who was initially referred by Dr. Dennard Schaumann for evaluation of abdominal aortic aneurysm. The patient denies any abdominal or back symptoms. CT scan was obtained which revealed the aneurysm to be 4.3 centimeters in maximum diameter. He returns today for follow up.  Dr. Kellie Simmering last saw pt on 11/04/13. At that time CT angiogram of the abdomen and pelvis demonstrated an infrarenal abdominal aortic aneurysm which extends up to near the renal arteries with some laminated thrombus at that area. Maximum diameter at that time was slightly over 4 cm.  Pt has no known family history of aneurysms.   The patient denies claudication in legs with walking. The patient reports history of a TIA about March of 2016 as manifested by weakness in his right arm for about 15 minutes, the TV he was watching looked fuzzy which lasted for an hour, denies speech difficulties. Pt denies any cardiac problems.  He states he is not physically active, admits to no barriers to being active.  Pt Diabetic: No Pt smoker: non-smoker  Pt meds include: Statin : yes ASA: yes Other anticoagulants/antiplatelets: Plavix was stopped by his PCP about July 2016, pt does not know why this was stopped, denies bleeding issues, had some bruising issues.   Past Medical History  Diagnosis Date  . Mononeuritis of unspecified site   . Actinic keratosis   . Nonspecific abnormal results of thyroid function study   . Hypertrophy of prostate without urinary obstruction and other lower urinary tract symptoms (LUTS)   . Unspecified essential hypertension   . Stricture and stenosis of esophagus   . Left sided ulcerative (chronic) colitis   . Duodenitis without mention of hemorrhage   . Esophageal reflux   . Diverticulosis of colon (without mention of hemorrhage)   .  Prostate cancer     cryotherapy 2011 [facility])  . Sleep apnea     ahi 75, cpap 14   Past Surgical History  Procedure Laterality Date  . Cholecystectomy    . Prostate surgery    . Esophagogastroduodenoscopy  03/22/2011    Procedure: ESOPHAGOGASTRODUODENOSCOPY (EGD);  Surgeon: Inda Castle, MD;  Location: [facility];  Service: Endoscopy;  Laterality: N/A;  . Balloon dilation  03/22/2011    Procedure: BALLOON DILATION;  Surgeon: Inda Castle, MD;  Location: [facility];  Service: Endoscopy;  Laterality: N/A;  kelly/ebp   Social History Social History   Social History  . Marital Status: Married    Spouse Name: N/A  . Number of Children: 1  . Years of Education: Some colg   Occupational History  . Retired     Social History Main Topics  . Smoking status: Never Smoker   . Smokeless tobacco: Never Used  . Alcohol Use: 1.2 oz/week    2 Glasses of wine per week  . Drug Use: No  . Sexual Activity: Not on file   Other Topics Concern  . Not on file   Social History Narrative   1 -2 cups of coffee a day, some tea consumption    Family History Family History  Problem Relation Age of Onset  . Cancer    . Anesthesia problems Neg Hx   . Hypotension Neg Hx   . Malignant hyperthermia Neg Hx   . Pseudochol deficiency Neg Hx   . Cancer Mother   .  Snoring Father     Current Outpatient Prescriptions on File Prior to Visit  Medication Sig Dispense Refill  . aspirin EC 81 MG EC tablet Take 1 tablet (81 mg total) by mouth daily.    Marland Kitchen atorvastatin (LIPITOR) 80 MG tablet Take 1 tablet (80 mg total) by mouth daily. 90 tablet 3  . citalopram (CELEXA) 10 MG tablet Take 5 mg by mouth daily.    . clopidogrel (PLAVIX) 75 MG tablet TAKE 1 TABLET (75 MG TOTAL) BY MOUTH DAILY. 30 tablet 11  . fluticasone (FLONASE) 50 MCG/ACT nasal spray   11  . Misc. Throat Products (BREATHE RIGHT SNORE RELIEF MT) Use as directed 1 strip in the mouth or throat at bedtime.    . nebivolol (BYSTOLIC) 10 MG  tablet Take 10 mg by mouth daily.    . tamsulosin (FLOMAX) 0.4 MG CAPS capsule Take 1 capsule (0.4 mg total) by mouth daily after supper. For prostate 30 capsule 6  . traZODone (DESYREL) 50 MG tablet Use 1/2 to 1 po nightly 30 tablet 5   No current facility-administered medications on file prior to visit.   Allergies  Allergen Reactions  . Metronidazole Anaphylaxis  . Amoxicillin-Pot Clavulanate Other (See Comments)    UNKNOWN  . Ciprofloxacin Other (See Comments)    UNKNOWN  . Mesalamine     REACTION: Intolerance    ROS: See HPI for pertinent positives and negatives.  Physical Examination  Filed Vitals:   11/06/14 0906 11/06/14 0911  BP: 141/88 149/93  Pulse: 49 46  Temp: 97.1 F (36.2 C)   TempSrc: Oral   Resp: 14   Height: 6\' 3"  (1.905 m)   Weight: 230 lb (104.327 kg)   SpO2: 98%   .Body mass index is 28.75 kg/(m^2).  General: A&O x 3, WD, overweight male.  Pulmonary: Sym exp, good air movt, CTAB, no rales, rhonchi, or wheezing.  Cardiac: RRR, Nl S1, S2, no detected murmur.   Carotid Bruits Right Left   Negative Negative   Aorta is not palpable Radial pulses are 2+ palpable and =                          VASCULAR EXAM:                                                                                                         LE Pulses Right Left       FEMORAL  2+ palpable  2+ palpable        POPLITEAL  2+ palpable   2+ palpable       POSTERIOR TIBIAL  3+ palpable   2+ palpable        DORSALIS PEDIS      ANTERIOR TIBIAL 2+ palpable  2+ palpable      Gastrointestinal: soft, NTND, -G/R, - HSM, - masses palpated, - CVAT B.  Musculoskeletal: M/S 5/5 throughout, Extremities without ischemic changes.  Neurologic: CN 2-12 intact, Pain and light touch intact in extremities are intact, Motor exam as listed  above.  Non-Invasive Vascular Imaging  AAA Duplex (11/06/2014)  Previous size: 4.1 cm (Date: 11/04/13 [CT])  Current size:  4.6 cm (Date:  11/06/2014)  Medical Decision Making  The patient is a 74 y.o. male who presents with Asymptomatic AAA with 0.5 cm increase in size in a year.   Based on this patient's exam and diagnostic studies, the patient will follow up in 6 months  with the following studies: AAA duplex.  Consideration for repair of AAA would be made when the size is 5.5 cm, growth > 1 cm/yr, and symptomatic status.  I emphasized the importance of maximal medical management including strict control of blood pressure, blood glucose, and lipid levels, antiplatelet agents, obtaining regular exercise, and continued cessation of smoking.   The patient was given information about AAA including signs, symptoms, treatment, and how to minimize the risk of enlargement and rupture of aneurysms.    The patient was advised to call 911 should the patient experience sudden onset abdominal or back pain.   Thank you for allowing [place] to participate in this patient's care.  Clemon Chambers, RN, MSN, FNP-C [facility] [place] Office: [phone]  Clinic Physician: Bridgett Larsson  11/06/2014, 8:59 AM

## 2014-11-09 ENCOUNTER — Other Ambulatory Visit (HOSPITAL_COMMUNITY): Payer: PPO

## 2014-11-09 ENCOUNTER — Ambulatory Visit: Payer: Self-pay | Admitting: Family

## 2014-11-14 ENCOUNTER — Other Ambulatory Visit: Payer: Self-pay | Admitting: Family Medicine

## 2014-11-19 ENCOUNTER — Encounter: Payer: Self-pay | Admitting: Family Medicine

## 2014-11-19 ENCOUNTER — Ambulatory Visit (INDEPENDENT_AMBULATORY_CARE_PROVIDER_SITE_OTHER): Payer: PPO | Admitting: Family Medicine

## 2014-11-19 VITALS — BP 122/78 | HR 50 | Temp 97.7°F | Resp 16 | Ht 74.0 in | Wt 234.0 lb

## 2014-11-19 DIAGNOSIS — N400 Enlarged prostate without lower urinary tract symptoms: Secondary | ICD-10-CM | POA: Diagnosis not present

## 2014-11-19 DIAGNOSIS — Z8673 Personal history of transient ischemic attack (TIA), and cerebral infarction without residual deficits: Secondary | ICD-10-CM

## 2014-11-19 DIAGNOSIS — Z23 Encounter for immunization: Secondary | ICD-10-CM

## 2014-11-19 LAB — LIPID PANEL
Cholesterol: 151 mg/dL (ref 125–200)
HDL: 36 mg/dL — ABNORMAL LOW (ref >=40)
LDL Cholesterol: 97 mg/dL (ref <130)
Total CHOL/HDL Ratio: 4.2 Ratio (ref <=5.0)
Triglycerides: 91 mg/dL (ref <150)
VLDL: 18 mg/dL (ref <30)

## 2014-11-19 LAB — COMPLETE METABOLIC PANEL WITH GFR
ALT: 17 U/L (ref 9–46)
AST: 19 U/L (ref 10–35)
Albumin: 4 g/dL (ref 3.6–5.1)
Alkaline Phosphatase: 108 U/L (ref 40–115)
BUN: 18 mg/dL (ref 7–25)
CO2: 26 mmol/L (ref 20–31)
Calcium: 9 mg/dL (ref 8.6–10.3)
Chloride: 106 mmol/L (ref 98–110)
Creat: 1.44 mg/dL — ABNORMAL HIGH (ref 0.70–1.18)
GFR, Est African American: 55 mL/min — ABNORMAL LOW (ref >=60)
GFR, Est Non African American: 48 mL/min — ABNORMAL LOW (ref >=60)
Glucose, Bld: 81 mg/dL (ref 70–99)
Potassium: 4.5 mmol/L (ref 3.5–5.3)
Sodium: 140 mmol/L (ref 135–146)
Total Bilirubin: 0.7 mg/dL (ref 0.2–1.2)
Total Protein: 6.2 g/dL (ref 6.1–8.1)

## 2014-11-19 MED ORDER — FINASTERIDE 5 MG PO TABS: 5.0000 mg | ORAL_TABLET | Freq: Every day | ORAL | Status: DC

## 2014-11-19 NOTE — Addendum Note (Signed)
Addended by: Shary Decamp B on: 11/19/2014 12:07 PM   Modules accepted: Orders

## 2014-11-19 NOTE — Progress Notes (Signed)
Subjective:    Patient ID: Matthew Luna, male    DOB: 09-15-1940, 74 y.o.   MRN: [id number]  HPI 05/14/14 Patient was recently admitted for a TIA.  Patient was admitted with double vision, right arm numbness, and ataxia. He was diagnosed with a TIA. Symptoms resolve spontaneously after one hour. He did not require TPA. MRA of the neck revealed left vertebral artery high-grade stenosis. Patient was started on dual antiplatelet therapy with aspirin and Plavix for the next 3 months. After July he is to continue Plavix monotherapy indefinitely. Blood pressure fluctuated significantly during the hospitalization. The patient developed prerenal azotemia. He is supposed to have a BMP rechecked as an outpatient. Today he is doing well. His blood pressures well controlled at 110/80. He denies any chest pain shortness of breath or dyspnea on exertion. Patient is still requiring the Breathe Right strips and is not wearing his CPAP machine.  At that time, my plan was: Patient's blood pressure is controlled. I will have the patient repeat his sleep study while wearing his Breathe Right strips to see if his sleep apnea is actually adequately controlled. If not I would strongly encourage the patient to resume CPAP. Patient was found to have an LDL cholesterol of 170. He was started on Lipitor 80 mg a day. He is due for repeat fasting lipid panel in July. He is to continue dual antiplatelet therapy until July. After July he will continue Plavix alone. I will have the patient return to check a CBC and a CMP to monitor his electrolytes and his prerenal azotemia. Recheck the patient in 3 months and await the results of the sleep study  08/18/14 Patient has almost completed 3 months of dual antiplatelets therapy. He is scheduled to discontinue Plavix at the end of July. He is due to recheck his cholesterol today. He denies any myalgias or right upper quadrant pain on Lipitor. His blood pressure is well controlled at 110/90.  Patient has had a sleep study. He needs to wear CPAP with a CWP of 14. This has still not been scheduled to his neurologist.  At that time,, my plan was: patient received Prevnar 13 today. His blood pressures adequately controlled. I will check a fasting lipid panel to ensure that his cholesterol is less than 70. I will also on tachycardia DME and schedule the patient for a CPAP supplies  11/19/14 Patient is here today for follow-up. He is now wearing his CPAP although he admits he only wears it 50% of the time. However his headaches have stopped. He is currently taking aspirin 81 mg by mouth daily. He was supposed to be on Plavix indefinitely. His blood pressure is well controlled at 122/78. He is due to check a fasting lipid panel. His goal LDL cholesterol is less than 70. He is taking Flomax for BPH. This is helped some. However he continues to have nocturia 3-4 episodes a night. He is having weak urinary stream. He reports bladder discomfort. Past Medical History  Diagnosis Date  . Mononeuritis of unspecified site   . Actinic keratosis   . Nonspecific abnormal results of thyroid function study   . Hypertrophy of prostate without urinary obstruction and other lower urinary tract symptoms (LUTS)   . Unspecified essential hypertension   . Stricture and stenosis of esophagus   . Left sided ulcerative (chronic) colitis ([place])   . Duodenitis without mention of hemorrhage   . Esophageal reflux   . Diverticulosis of colon (without mention of  hemorrhage)   . Prostate cancer ([place])     cryotherapy 2011 [facility])  . Sleep apnea     ahi 75, cpap 14  . AAA (abdominal aortic aneurysm) [facility])    Past Surgical History  Procedure Laterality Date  . Cholecystectomy    . Prostate surgery    . Esophagogastroduodenoscopy  03/22/2011    Procedure: ESOPHAGOGASTRODUODENOSCOPY (EGD);  Surgeon: Inda Castle, MD;  Location: [facility];  Service: Endoscopy;  Laterality: N/A;  . Balloon dilation  03/22/2011     Procedure: BALLOON DILATION;  Surgeon: Inda Castle, MD;  Location: [facility];  Service: Endoscopy;  Laterality: N/A;  kelly/ebp   Current Outpatient Prescriptions on File Prior to Visit  Medication Sig Dispense Refill  . aspirin EC 81 MG EC tablet Take 1 tablet (81 mg total) by mouth daily.    Marland Kitchen atorvastatin (LIPITOR) 80 MG tablet Take 1 tablet (80 mg total) by mouth daily. 90 tablet 3  . citalopram (CELEXA) 10 MG tablet Take 5 mg by mouth daily.    . citalopram (CELEXA) 40 MG tablet TAKE 1 TABLET BY MOUTH EVERY DAY 30 tablet 11  . fluticasone (FLONASE) 50 MCG/ACT nasal spray   11  . Misc. Throat Products (BREATHE RIGHT SNORE RELIEF MT) Use as directed 1 strip in the mouth or throat at bedtime.    . nebivolol (BYSTOLIC) 10 MG tablet Take 10 mg by mouth daily.    . tamsulosin (FLOMAX) 0.4 MG CAPS capsule Take 1 capsule (0.4 mg total) by mouth daily after supper. For prostate 30 capsule 6   No current facility-administered medications on file prior to visit.   Allergies  Allergen Reactions  . Metronidazole Anaphylaxis  . Amoxicillin-Pot Clavulanate Other (See Comments)    UNKNOWN  . Ciprofloxacin Other (See Comments)    UNKNOWN  . Mesalamine     REACTION: Intolerance   Social History   Social History  . Marital Status: Married    Spouse Name: N/A  . Number of Children: 1  . Years of Education: Some colg   Occupational History  . Retired     Social History Main Topics  . Smoking status: Never Smoker   . Smokeless tobacco: Never Used  . Alcohol Use: 1.2 oz/week    2 Glasses of wine per week  . Drug Use: No  . Sexual Activity: Not on file   Other Topics Concern  . Not on file   Social History Narrative   1 -2 cups of coffee a day, some tea consumption       Review of Systems  All other systems reviewed and are negative.      Objective:   Physical Exam  Constitutional: He is oriented to person, place, and time. He appears well-developed and well-nourished.   Neck: Neck supple.  Cardiovascular: Normal rate, regular rhythm, normal heart sounds and intact distal pulses.   No murmur heard. Pulmonary/Chest: Effort normal and breath sounds normal. No respiratory distress. He has no wheezes. He has no rales.  Abdominal: Soft. Bowel sounds are normal. He exhibits no distension. There is no tenderness. There is no rebound and no guarding.  Lymphadenopathy:    He has no cervical adenopathy.  Neurological: He is alert and oriented to person, place, and time. He has normal reflexes. No cranial nerve deficit. He exhibits normal muscle tone. Coordination normal.  Vitals reviewed.         Assessment & Plan:  BPH (benign prostatic hyperplasia) - Plan: finasteride (  PROSCAR) 5 MG tablet  History of CVA (cerebrovascular accident) - Plan: COMPLETE METABOLIC PANEL WITH GFR, Lipid panel  patient needs to stop the aspirin and go back on the Plavix 75 mg by mouth daily. His blood pressures well controlled. I will check a fasting lipid panel. Goal LDL cholesterol is less than 70. He will receive his flu shot today. I will add finasteride 5 mg by mouth daily to Flomax to try to help alleviate the symptoms of BPH

## 2014-11-30 ENCOUNTER — Other Ambulatory Visit: Payer: Self-pay | Admitting: Family Medicine

## 2014-11-30 MED ORDER — ATORVASTATIN CALCIUM 80 MG PO TABS: 80.0000 mg | ORAL_TABLET | Freq: Every day | ORAL | Status: DC

## 2014-11-30 MED ORDER — CLOPIDOGREL BISULFATE 75 MG PO TABS: 75.0000 mg | ORAL_TABLET | Freq: Every day | ORAL | Status: DC

## 2014-12-15 ENCOUNTER — Other Ambulatory Visit: Payer: Self-pay | Admitting: Family Medicine

## 2014-12-16 MED ORDER — NEBIVOLOL HCL 10 MG PO TABS: 10.0000 mg | ORAL_TABLET | Freq: Every day | ORAL | Status: DC

## 2014-12-16 NOTE — Telephone Encounter (Signed)
Medication refilled per protocol. 

## 2015-01-25 ENCOUNTER — Other Ambulatory Visit: Payer: Self-pay | Admitting: Family Medicine

## 2015-01-26 NOTE — Telephone Encounter (Signed)
Medication refilled per protocol. 

## 2015-01-31 DIAGNOSIS — G459 Transient cerebral ischemic attack, unspecified: Secondary | ICD-10-CM

## 2015-01-31 HISTORY — DX: Transient cerebral ischemic attack, unspecified: G45.9

## 2015-03-17 ENCOUNTER — Ambulatory Visit: Payer: PPO | Admitting: Family Medicine

## 2015-04-26 DIAGNOSIS — M5413 Radiculopathy, cervicothoracic region: Secondary | ICD-10-CM | POA: Diagnosis not present

## 2015-04-26 DIAGNOSIS — M9902 Segmental and somatic dysfunction of thoracic region: Secondary | ICD-10-CM | POA: Diagnosis not present

## 2015-04-26 DIAGNOSIS — M9901 Segmental and somatic dysfunction of cervical region: Secondary | ICD-10-CM | POA: Diagnosis not present

## 2015-04-26 DIAGNOSIS — M546 Pain in thoracic spine: Secondary | ICD-10-CM | POA: Diagnosis not present

## 2015-05-04 ENCOUNTER — Encounter: Payer: Self-pay | Admitting: Family

## 2015-05-06 ENCOUNTER — Other Ambulatory Visit: Payer: Self-pay | Admitting: Family Medicine

## 2015-05-06 NOTE — Telephone Encounter (Signed)
Refill appropriate and filled per protocol. 

## 2015-05-11 ENCOUNTER — Ambulatory Visit: Payer: PPO | Admitting: Family

## 2015-05-11 ENCOUNTER — Ambulatory Visit (HOSPITAL_COMMUNITY): Payer: PPO

## 2015-05-12 DIAGNOSIS — M9902 Segmental and somatic dysfunction of thoracic region: Secondary | ICD-10-CM | POA: Diagnosis not present

## 2015-05-12 DIAGNOSIS — M546 Pain in thoracic spine: Secondary | ICD-10-CM | POA: Diagnosis not present

## 2015-05-12 DIAGNOSIS — M5413 Radiculopathy, cervicothoracic region: Secondary | ICD-10-CM | POA: Diagnosis not present

## 2015-05-12 DIAGNOSIS — M9901 Segmental and somatic dysfunction of cervical region: Secondary | ICD-10-CM | POA: Diagnosis not present

## 2015-05-13 ENCOUNTER — Encounter: Payer: Self-pay | Admitting: Family Medicine

## 2015-05-13 ENCOUNTER — Ambulatory Visit (INDEPENDENT_AMBULATORY_CARE_PROVIDER_SITE_OTHER): Payer: PPO | Admitting: Family Medicine

## 2015-05-13 VITALS — BP 128/78 | HR 82 | Temp 97.3°F | Resp 14 | Ht 74.0 in | Wt 236.0 lb

## 2015-05-13 DIAGNOSIS — R42 Dizziness and giddiness: Secondary | ICD-10-CM

## 2015-05-13 MED ORDER — CLOBETASOL PROPIONATE 0.05 % EX SOLN: 1.0000 "application " | Freq: Two times a day (BID) | CUTANEOUS | Status: DC

## 2015-05-13 NOTE — Progress Notes (Signed)
Subjective:    Patient ID: [facility], male    DOB: 02-09-1940, 75 y.o.   MRN: [id number]  HPI 05/14/14 Patient was recently admitted for a TIA.  Patient was admitted with double vision, right arm numbness, and ataxia. He was diagnosed with a TIA. Symptoms resolve spontaneously after one hour. He did not require TPA. MRA of the neck revealed left vertebral artery high-grade stenosis. Patient was started on dual antiplatelet therapy with aspirin and Plavix for the next 3 months. After July he is to continue Plavix monotherapy indefinitely. Blood pressure fluctuated significantly during the hospitalization. The patient developed prerenal azotemia. He is supposed to have a BMP rechecked as an outpatient. Today he is doing well. His blood pressures well controlled at 110/80. He denies any chest pain shortness of breath or dyspnea on exertion. Patient is still requiring the Breathe Right strips and is not wearing his CPAP machine.  At that time, my plan was: Patient's blood pressure is controlled. I will have the patient repeat his sleep study while wearing his Breathe Right strips to see if his sleep apnea is actually adequately controlled. If not I would strongly encourage the patient to resume CPAP. Patient was found to have an LDL cholesterol of 170. He was started on Lipitor 80 mg a day. He is due for repeat fasting lipid panel in July. He is to continue dual antiplatelet therapy until July. After July he will continue Plavix alone. I will have the patient return to check a CBC and a CMP to monitor his electrolytes and his prerenal azotemia. Recheck the patient in 3 months and await the results of the sleep study  08/18/14 Patient has almost completed 3 months of dual antiplatelets therapy. He is scheduled to discontinue Plavix at the end of July. He is due to recheck his cholesterol today. He denies any myalgias or right upper quadrant pain on Lipitor. His blood pressure is well controlled at 110/90.  Patient has had a sleep study. He needs to wear CPAP with a CWP of 14. This has still not been scheduled to his neurologist.  At that time,, my plan was: patient received Prevnar 13 today. His blood pressures adequately controlled. I will check a fasting lipid panel to ensure that his cholesterol is less than 70. I will also on tachycardia DME and schedule the patient for a CPAP supplies  11/19/14 Patient is here today for follow-up. He is now wearing his CPAP although he admits he only wears it 50% of the time. However his headaches have stopped. He is currently taking aspirin 81 mg by mouth daily. He was supposed to be on Plavix indefinitely. His blood pressure is well controlled at 122/78. He is due to check a fasting lipid panel. His goal LDL cholesterol is less than 70. He is taking Flomax for BPH. This is helped some. However he continues to have nocturia 3-4 episodes a night. He is having weak urinary stream. He reports bladder discomfort.  At that time, my plan was:  patient needs to stop the aspirin and go back on the Plavix 75 mg by mouth daily. His blood pressures well controlled. I will check a fasting lipid panel. Goal LDL cholesterol is less than 70. He will receive his flu shot today. I will add finasteride 5 mg by mouth daily to Flomax to try to help alleviate the symptoms of BPH   05/13/14 Patient reports dizziness and lightheadedness with standing. He describes the sensation as feeling foggy  all the time. He denies any blurry vision or headaches. He denies any weakness in his extremities. He denies any neurologic deficits. His blood pressure at home has been ranging between 110 and 120/70-80. Past Medical History  Diagnosis Date  . Mononeuritis of unspecified site   . Actinic keratosis   . Nonspecific abnormal results of thyroid function study   . Hypertrophy of prostate without urinary obstruction and other lower urinary tract symptoms (LUTS)   . Unspecified essential hypertension     . Stricture and stenosis of esophagus   . Left sided ulcerative (chronic) colitis ([place])   . Duodenitis without mention of hemorrhage   . Esophageal reflux   . Diverticulosis of colon (without mention of hemorrhage)   . Prostate cancer ([place])     cryotherapy 2011 [facility])  . Sleep apnea     ahi 75, cpap 14  . AAA (abdominal aortic aneurysm) [facility])    Past Surgical History  Procedure Laterality Date  . Cholecystectomy    . Prostate surgery    . Esophagogastroduodenoscopy  03/22/2011    Procedure: ESOPHAGOGASTRODUODENOSCOPY (EGD);  Surgeon: Inda Castle, MD;  Location: [facility];  Service: Endoscopy;  Laterality: N/A;  . Balloon dilation  03/22/2011    Procedure: BALLOON DILATION;  Surgeon: Inda Castle, MD;  Location: [facility];  Service: Endoscopy;  Laterality: N/A;  kelly/ebp   Current Outpatient Prescriptions on File Prior to Visit  Medication Sig Dispense Refill  . aspirin EC 81 MG EC tablet Take 1 tablet (81 mg total) by mouth daily.    Marland Kitchen atorvastatin (LIPITOR) 80 MG tablet Take 1 tablet (80 mg total) by mouth daily. 90 tablet 3  . citalopram (CELEXA) 10 MG tablet Take 5 mg by mouth daily.    . citalopram (CELEXA) 40 MG tablet TAKE 1 TABLET BY MOUTH EVERY DAY 30 tablet 11  . clopidogrel (PLAVIX) 75 MG tablet Take 1 tablet (75 mg total) by mouth daily. 90 tablet 3  . finasteride (PROSCAR) 5 MG tablet TAKE 1 TABLET (5 MG TOTAL) BY MOUTH DAILY. 90 tablet 1  . fluticasone (FLONASE) 50 MCG/ACT nasal spray   11  . Misc. Throat Products (BREATHE RIGHT SNORE RELIEF MT) Use as directed 1 strip in the mouth or throat at bedtime.    . nebivolol (BYSTOLIC) 10 MG tablet Take 1 tablet (10 mg total) by mouth daily. 90 tablet 0  . tamsulosin (FLOMAX) 0.4 MG CAPS capsule TAKE 1 CAPSULE (0.4 MG TOTAL) BY MOUTH DAILY AFTER SUPPER. FOR PROSTATE 30 capsule 6   No current facility-administered medications on file prior to visit.   Allergies  Allergen Reactions  . Metronidazole Anaphylaxis   . Amoxicillin-Pot Clavulanate Other (See Comments)    UNKNOWN  . Ciprofloxacin Other (See Comments)    UNKNOWN  . Mesalamine     REACTION: Intolerance   Social History   Social History  . Marital Status: Married    Spouse Name: N/A  . Number of Children: 1  . Years of Education: Some colg   Occupational History  . Retired     Social History Main Topics  . Smoking status: Never Smoker   . Smokeless tobacco: Never Used  . Alcohol Use: 1.2 oz/week    2 Glasses of wine per week  . Drug Use: No  . Sexual Activity: Not on file   Other Topics Concern  . Not on file   Social History Narrative   1 -2 cups of coffee a day, some  tea consumption       Review of Systems  All other systems reviewed and are negative.      Objective:   Physical Exam  Constitutional: He is oriented to person, place, and time. He appears well-developed and well-nourished.  Neck: Neck supple.  Cardiovascular: Normal rate, regular rhythm, normal heart sounds and intact distal pulses.   No murmur heard. Pulmonary/Chest: Effort normal and breath sounds normal. No respiratory distress. He has no wheezes. He has no rales.  Abdominal: Soft. Bowel sounds are normal. He exhibits no distension. There is no tenderness. There is no rebound and no guarding.  Lymphadenopathy:    He has no cervical adenopathy.  Neurological: He is alert and oriented to person, place, and time. He has normal reflexes. No cranial nerve deficit. He exhibits normal muscle tone. Coordination normal.  Vitals reviewed.         Assessment & Plan:  Orthostatic dizziness I will have the patient temporarily discontinue Flomax to see if the dizziness improves. Consider decreasing the dose of bystolic if not.  Patient is due for fasting lab work at his convenience

## 2015-05-25 ENCOUNTER — Other Ambulatory Visit: Payer: Self-pay | Admitting: Family Medicine

## 2015-06-01 ENCOUNTER — Ambulatory Visit (INDEPENDENT_AMBULATORY_CARE_PROVIDER_SITE_OTHER): Payer: PPO | Admitting: Family Medicine

## 2015-06-01 ENCOUNTER — Encounter: Payer: Self-pay | Admitting: Family Medicine

## 2015-06-01 ENCOUNTER — Other Ambulatory Visit: Payer: Self-pay | Admitting: Family Medicine

## 2015-06-01 VITALS — BP 118/68 | HR 74 | Temp 98.4°F | Resp 16 | Ht 74.0 in | Wt 232.0 lb

## 2015-06-01 DIAGNOSIS — R946 Abnormal results of thyroid function studies: Secondary | ICD-10-CM | POA: Diagnosis not present

## 2015-06-01 DIAGNOSIS — G609 Hereditary and idiopathic neuropathy, unspecified: Secondary | ICD-10-CM

## 2015-06-01 LAB — COMPLETE METABOLIC PANEL WITH GFR
ALT: 36 U/L (ref 9–46)
AST: 36 U/L — ABNORMAL HIGH (ref 10–35)
Albumin: 3.9 g/dL (ref 3.6–5.1)
Alkaline Phosphatase: 110 U/L (ref 40–115)
BUN: 22 mg/dL (ref 7–25)
CO2: 25 mmol/L (ref 20–31)
Calcium: 9.2 mg/dL (ref 8.6–10.3)
Chloride: 107 mmol/L (ref 98–110)
Creat: 1.56 mg/dL — ABNORMAL HIGH (ref 0.70–1.18)
GFR, Est African American: 50 mL/min — ABNORMAL LOW (ref >=60)
GFR, Est Non African American: 43 mL/min — ABNORMAL LOW (ref >=60)
Glucose, Bld: 73 mg/dL (ref 70–99)
Potassium: 4.4 mmol/L (ref 3.5–5.3)
Sodium: 140 mmol/L (ref 135–146)
Total Bilirubin: 0.5 mg/dL (ref 0.2–1.2)
Total Protein: 6.2 g/dL (ref 6.1–8.1)

## 2015-06-01 LAB — TSH: TSH: 9.94 mIU/L — ABNORMAL HIGH (ref 0.40–4.50)

## 2015-06-01 LAB — VITAMIN B12: Vitamin B-12: 364 pg/mL (ref 200–1100)

## 2015-06-01 MED ORDER — GABAPENTIN 300 MG PO CAPS: 300.0000 mg | ORAL_CAPSULE | Freq: Every day | ORAL | Status: DC

## 2015-06-01 NOTE — Progress Notes (Signed)
Subjective:    Patient ID: Matthew [facility], male    DOB: 1940/04/11, 75 y.o.   MRN: [id number]  HPI Patient reports that he has been suffering with burning stinging pain in the plantar aspects of both feet primarily at night for the last month or so. He denies any back pain. He denies any numbness or weakness in the legs. It is all isolated to the bottoms of his feet. He describes the sensation as though he were walking on hot coals.  It mainly occurs at night it does not bother him during the daytime. He has no history of diabetes. He drinks 2 glasses of wine at night but no more than that Past Medical History  Diagnosis Date  . Mononeuritis of unspecified site   . Actinic keratosis   . Nonspecific abnormal results of thyroid function study   . Hypertrophy of prostate without urinary obstruction and other lower urinary tract symptoms (LUTS)   . Unspecified essential hypertension   . Stricture and stenosis of esophagus   . Left sided ulcerative (chronic) colitis ([place])   . Duodenitis without mention of hemorrhage   . Esophageal reflux   . Diverticulosis of colon (without mention of hemorrhage)   . Prostate cancer ([place])     cryotherapy 2011 [facility])  . Sleep apnea     ahi 75, cpap 14  . AAA (abdominal aortic aneurysm) [facility])    Past Surgical History  Procedure Laterality Date  . Cholecystectomy    . Prostate surgery    . Esophagogastroduodenoscopy  03/22/2011    Procedure: ESOPHAGOGASTRODUODENOSCOPY (EGD);  Surgeon: Inda Castle, MD;  Location: [facility];  Service: Endoscopy;  Laterality: N/A;  . Balloon dilation  03/22/2011    Procedure: BALLOON DILATION;  Surgeon: Inda Castle, MD;  Location: [facility];  Service: Endoscopy;  Laterality: N/A;  kelly/ebp   Current Outpatient Prescriptions on File Prior to Visit  Medication Sig Dispense Refill  . aspirin EC 81 MG EC tablet Take 1 tablet (81 mg total) by mouth daily.    Marland Kitchen atorvastatin (LIPITOR) 80 MG tablet Take 1 tablet (80 mg  total) by mouth daily. 90 tablet 3  . BYSTOLIC 10 MG tablet TAKE 1 TABLET BY MOUTH EVERY DAY 90 tablet 4  . citalopram (CELEXA) 40 MG tablet TAKE 1 TABLET BY MOUTH EVERY DAY 30 tablet 11  . clobetasol (TEMOVATE) 0.05 % external solution Apply 1 application topically 2 (two) times daily. 50 mL 5  . finasteride (PROSCAR) 5 MG tablet TAKE 1 TABLET (5 MG TOTAL) BY MOUTH DAILY. 90 tablet 1  . fluticasone (FLONASE) 50 MCG/ACT nasal spray   11  . Misc. Throat Products (BREATHE RIGHT SNORE RELIEF MT) Use as directed 1 strip in the mouth or throat at bedtime.     No current facility-administered medications on file prior to visit.   Allergies  Allergen Reactions  . Metronidazole Anaphylaxis  . Amoxicillin-Pot Clavulanate Other (See Comments)    UNKNOWN  . Ciprofloxacin Other (See Comments)    UNKNOWN  . Mesalamine     REACTION: Intolerance   Social History   Social History  . Marital Status: Married    Spouse Name: N/A  . Number of Children: 1  . Years of Education: Some colg   Occupational History  . Retired     Social History Main Topics  . Smoking status: Never Smoker   . Smokeless tobacco: Never Used  . Alcohol Use: 1.2 oz/week    2  Glasses of wine per week  . Drug Use: No  . Sexual Activity: Not on file   Other Topics Concern  . Not on file   Social History Narrative   1 -2 cups of coffee a day, some tea consumption       Review of Systems  All other systems reviewed and are negative.      Objective:   Physical Exam  Constitutional: He is oriented to person, place, and time.  Cardiovascular: Normal rate, regular rhythm and normal heart sounds.   Pulmonary/Chest: Effort normal and breath sounds normal. No respiratory distress. He has no wheezes. He has no rales.  Musculoskeletal: He exhibits no edema.  Neurological: He is alert and oriented to person, place, and time. He has normal reflexes. He displays normal reflexes. No cranial nerve deficit. He exhibits  normal muscle tone. Coordination normal.  Vitals reviewed.         Assessment & Plan:  Idiopathic peripheral neuropathy (HCC) - Plan: COMPLETE METABOLIC PANEL WITH GFR, TSH, Vitamin B12, gabapentin (NEURONTIN) 300 MG capsule  Symptoms sound like peripheral neuropathy. I will check a CMP to monitor renal function, liver function, and sugar levels. I will check a thyroid test along with a vitamin B12 level. Meanwhile he can start gabapentin 300 mg by mouth daily at bedtime prior to bedtime for the neuropathic pain

## 2015-06-05 LAB — T4: T4, Total: 5.5 ug/dL (ref 4.5–12.0)

## 2015-06-14 DIAGNOSIS — M5413 Radiculopathy, cervicothoracic region: Secondary | ICD-10-CM | POA: Diagnosis not present

## 2015-06-14 DIAGNOSIS — M9902 Segmental and somatic dysfunction of thoracic region: Secondary | ICD-10-CM | POA: Diagnosis not present

## 2015-06-14 DIAGNOSIS — M9901 Segmental and somatic dysfunction of cervical region: Secondary | ICD-10-CM | POA: Diagnosis not present

## 2015-06-14 DIAGNOSIS — M546 Pain in thoracic spine: Secondary | ICD-10-CM | POA: Diagnosis not present

## 2015-06-25 DIAGNOSIS — M9901 Segmental and somatic dysfunction of cervical region: Secondary | ICD-10-CM | POA: Diagnosis not present

## 2015-06-25 DIAGNOSIS — M5413 Radiculopathy, cervicothoracic region: Secondary | ICD-10-CM | POA: Diagnosis not present

## 2015-06-25 DIAGNOSIS — M546 Pain in thoracic spine: Secondary | ICD-10-CM | POA: Diagnosis not present

## 2015-06-25 DIAGNOSIS — M9902 Segmental and somatic dysfunction of thoracic region: Secondary | ICD-10-CM | POA: Diagnosis not present

## 2015-07-13 DIAGNOSIS — M546 Pain in thoracic spine: Secondary | ICD-10-CM | POA: Diagnosis not present

## 2015-07-13 DIAGNOSIS — M9901 Segmental and somatic dysfunction of cervical region: Secondary | ICD-10-CM | POA: Diagnosis not present

## 2015-07-13 DIAGNOSIS — M9902 Segmental and somatic dysfunction of thoracic region: Secondary | ICD-10-CM | POA: Diagnosis not present

## 2015-07-13 DIAGNOSIS — M5413 Radiculopathy, cervicothoracic region: Secondary | ICD-10-CM | POA: Diagnosis not present

## 2015-08-11 ENCOUNTER — Ambulatory Visit: Payer: PPO | Admitting: Family

## 2015-08-11 ENCOUNTER — Other Ambulatory Visit (HOSPITAL_COMMUNITY): Payer: PPO

## 2015-08-27 ENCOUNTER — Encounter: Payer: Self-pay | Admitting: Family

## 2015-09-03 ENCOUNTER — Ambulatory Visit: Payer: PPO | Admitting: Family

## 2015-09-03 ENCOUNTER — Ambulatory Visit (HOSPITAL_COMMUNITY): Payer: PPO | Attending: Family

## 2015-09-03 ENCOUNTER — Other Ambulatory Visit (HOSPITAL_COMMUNITY): Payer: PPO

## 2015-10-13 DIAGNOSIS — M47819 Spondylosis without myelopathy or radiculopathy, site unspecified: Secondary | ICD-10-CM | POA: Diagnosis not present

## 2015-10-13 DIAGNOSIS — N200 Calculus of kidney: Secondary | ICD-10-CM | POA: Diagnosis not present

## 2015-10-13 DIAGNOSIS — R339 Retention of urine, unspecified: Secondary | ICD-10-CM | POA: Diagnosis not present

## 2015-10-13 DIAGNOSIS — N320 Bladder-neck obstruction: Secondary | ICD-10-CM | POA: Diagnosis not present

## 2015-10-13 DIAGNOSIS — C61 Malignant neoplasm of prostate: Secondary | ICD-10-CM | POA: Diagnosis not present

## 2015-10-14 ENCOUNTER — Other Ambulatory Visit: Payer: Self-pay | Admitting: Family Medicine

## 2015-10-28 DIAGNOSIS — N138 Other obstructive and reflux uropathy: Secondary | ICD-10-CM | POA: Diagnosis not present

## 2015-10-28 DIAGNOSIS — N200 Calculus of kidney: Secondary | ICD-10-CM | POA: Diagnosis not present

## 2015-10-28 DIAGNOSIS — R350 Frequency of micturition: Secondary | ICD-10-CM | POA: Diagnosis not present

## 2015-10-28 DIAGNOSIS — D414 Neoplasm of uncertain behavior of bladder: Secondary | ICD-10-CM | POA: Diagnosis not present

## 2015-10-28 DIAGNOSIS — N401 Enlarged prostate with lower urinary tract symptoms: Secondary | ICD-10-CM | POA: Diagnosis not present

## 2015-10-28 DIAGNOSIS — C61 Malignant neoplasm of prostate: Secondary | ICD-10-CM | POA: Diagnosis not present

## 2015-11-02 DIAGNOSIS — I714 Abdominal aortic aneurysm, without rupture: Secondary | ICD-10-CM | POA: Diagnosis not present

## 2015-11-02 DIAGNOSIS — N261 Atrophy of kidney (terminal): Secondary | ICD-10-CM | POA: Diagnosis not present

## 2015-11-02 DIAGNOSIS — N200 Calculus of kidney: Secondary | ICD-10-CM | POA: Diagnosis not present

## 2015-11-25 DIAGNOSIS — N200 Calculus of kidney: Secondary | ICD-10-CM | POA: Diagnosis not present

## 2015-11-30 ENCOUNTER — Ambulatory Visit (INDEPENDENT_AMBULATORY_CARE_PROVIDER_SITE_OTHER): Payer: PPO | Admitting: Vascular Surgery

## 2015-11-30 ENCOUNTER — Encounter (INDEPENDENT_AMBULATORY_CARE_PROVIDER_SITE_OTHER): Payer: Self-pay | Admitting: Vascular Surgery

## 2015-11-30 VITALS — BP 110/70 | HR 74 | Resp 16 | Ht 76.0 in | Wt 230.0 lb

## 2015-11-30 DIAGNOSIS — I714 Abdominal aortic aneurysm, without rupture, unspecified: Secondary | ICD-10-CM

## 2015-11-30 DIAGNOSIS — N183 Chronic kidney disease, stage 3 unspecified: Secondary | ICD-10-CM

## 2015-11-30 DIAGNOSIS — R1013 Epigastric pain: Secondary | ICD-10-CM

## 2015-11-30 DIAGNOSIS — C61 Malignant neoplasm of prostate: Secondary | ICD-10-CM

## 2015-11-30 DIAGNOSIS — G458 Other transient cerebral ischemic attacks and related syndromes: Secondary | ICD-10-CM | POA: Diagnosis not present

## 2015-11-30 DIAGNOSIS — I10 Essential (primary) hypertension: Secondary | ICD-10-CM | POA: Diagnosis not present

## 2015-12-01 ENCOUNTER — Other Ambulatory Visit: Payer: Self-pay | Admitting: Family Medicine

## 2015-12-06 ENCOUNTER — Other Ambulatory Visit (INDEPENDENT_AMBULATORY_CARE_PROVIDER_SITE_OTHER): Payer: Self-pay | Admitting: Vascular Surgery

## 2015-12-06 DIAGNOSIS — I714 Abdominal aortic aneurysm, without rupture, unspecified: Secondary | ICD-10-CM

## 2015-12-06 NOTE — Assessment & Plan Note (Signed)
Complicates imaging and repair of these AAA

## 2015-12-06 NOTE — Assessment & Plan Note (Signed)
blood pressure control important in reducing the progression of atherosclerotic disease and aneurysmal degeneration. On appropriate oral medications.  

## 2015-12-06 NOTE — Assessment & Plan Note (Signed)
Followed by Urology at [facility].

## 2015-12-06 NOTE — Assessment & Plan Note (Signed)
The patient has several urology issues and follows with his urologist at [facility]. A recent CT scan that they perform demonstrated an approximately 4.7-4.8 cm infrarenal abdominal aortic aneurysm. Looking back 3 years, this was 4.5 cm. This has had slight growth, but is certainly approaching the size for prophylactic repair of 5 cm. I would plan to see the patient back in about 3 months with a repeat CT scan for further evaluation and potential treatment planning. The pathophysiology and natural history of abdominal aortic aneurysm were discussed with the patient in detail.

## 2015-12-06 NOTE — Patient Instructions (Signed)
Abdominal Aortic Aneurysm An aneurysm is a weakened or damaged part of an artery wall that bulges from the normal force of blood pumping through the body. An abdominal aortic aneurysm is an aneurysm that occurs in the lower part of the aorta, the main artery of the body.  The major concern with an abdominal aortic aneurysm is that it can enlarge and burst (rupture) or blood can flow between the layers of the wall of the aorta through a tear (aorticdissection). Both of these conditions can cause bleeding inside the body and can be life threatening unless diagnosed and treated promptly. CAUSES  The exact cause of an abdominal aortic aneurysm is unknown. Some contributing factors are:   A hardening of the arteries caused by the buildup of fat and other substances in the lining of a blood vessel (arteriosclerosis).  Inflammation of the walls of an artery (arteritis).   Connective tissue diseases, such as Marfan syndrome.   Abdominal trauma.   An infection, such as syphilis or staphylococcus, in the wall of the aorta (infectious aortitis) caused by bacteria. RISK FACTORS  Risk factors that contribute to an abdominal aortic aneurysm may include:  Age older than 60 years.   High blood pressure (hypertension).  Male gender.  Ethnicity (white race).  Obesity.  Family history of aneurysm (first degree relatives only).  Tobacco use. PREVENTION  The following healthy lifestyle habits may help decrease your risk of abdominal aortic aneurysm:  Quitting smoking. Smoking can raise your blood pressure and cause arteriosclerosis.  Limiting or avoiding alcohol.  Keeping your blood pressure, blood sugar level, and cholesterol levels within normal limits.  Decreasing your salt intake. In somepeople, too much salt can raise blood pressure and increase your risk of abdominal aortic aneurysm.  Eating a diet low in saturated fats and cholesterol.  Increasing your fiber intake by including  whole grains, vegetables, and fruits in your diet. Eating these foods may help lower blood pressure.  Maintaining a healthy weight.  Staying physically active and exercising regularly. SYMPTOMS  The symptoms of abdominal aortic aneurysm may vary depending on the size and rate of growth of the aneurysm.Most grow slowly and do not have any symptoms. When symptoms do occur, they may include:  Pain (abdomen, side, lower back, or groin). The pain may vary in intensity. A sudden onset of severe pain may indicate that the aneurysm has ruptured.  Feeling full after eating only small amounts of food.  Nausea or vomiting or both.  Feeling a pulsating lump in the abdomen.  Feeling faint or passing out. DIAGNOSIS  Since most unruptured abdominal aortic aneurysms have no symptoms, they are often discovered during diagnostic exams for other conditions. An aneurysm may be found during the following procedures:  Ultrasonography (A one-time screening for abdominal aortic aneurysm by ultrasonography is also recommended for all men aged 65-75 years who have ever smoked).  X-ray exams.  A computed tomography (CT).  Magnetic resonance imaging (MRI).  Angiography or arteriography. TREATMENT  Treatment of an abdominal aortic aneurysm depends on the size of your aneurysm, your age, and risk factors for rupture. Medication to control blood pressure and pain may be used to manage aneurysms smaller than 6 cm. Regular monitoring for enlargement may be recommended by your caregiver if:  The aneurysm is 3-4 cm in size (an annual ultrasonography may be recommended).  The aneurysm is 4-4.5 cm in size (an ultrasonography every 6 months may be recommended).  The aneurysm is larger than 4.5 cm in   size (your caregiver may ask that you be examined by a vascular surgeon). If your aneurysm is larger than 6 cm, surgical repair may be recommended. There are two main methods for repair of an aneurysm:   Endovascular  repair (a minimally invasive surgery). This is done most often.  Open repair. This method is used if an endovascular repair is not possible.   This information is not intended to replace advice given to you by your health care provider. Make sure you discuss any questions you have with your health care provider.   Document Released: 10/26/2004 Document Revised: 05/13/2012 Document Reviewed: 02/16/2012 Elsevier Interactive Patient Education 2016 [employer]  

## 2015-12-06 NOTE — Assessment & Plan Note (Signed)
Although history of leg could be the cause of this, that is not entirely clear and not typical.

## 2015-12-06 NOTE — Progress Notes (Signed)
Patient ID: Matthew [facility], male   DOB: 1940/10/12, 75 y.o.   MRN: [id number]  Chief Complaint  Patient presents with  . New Evaluation    AAA    HPI Matthew Luna is a 75 y.o. male.  I am asked to see the patient by Matthew Luna for evaluation of his AAA.  The patient reports Chronic epigastric and abdominal Luna which has been present for some time. He had a recent CT scan of his abdomen and pelvis for his urologic issues including prostate cancer. This was done at [facility]. Report reads a 4.8 cm infrarenal abdominal aortic aneurysm. About 2-3 years ago was noted to have a 4.5 cm infrarenal aneurysm. He has no signs of peripheral embolization. He does not really describe claudication symptoms. He has no new tearing abdominal Luna or back Luna.   Past Medical History:  Diagnosis Date  . AAA (abdominal aortic aneurysm) ([facility])   . Actinic keratosis   . Diverticulosis of colon (without mention of hemorrhage)   . Duodenitis without mention of hemorrhage   . Esophageal reflux   . Hypertrophy of prostate without urinary obstruction and other lower urinary tract symptoms (LUTS)   . Left sided ulcerative (chronic) colitis ([place])   . Mononeuritis of unspecified site   . Nonspecific abnormal results of thyroid function study   . Prostate cancer ([place])    cryotherapy 2011 [facility])  . Sleep apnea    ahi 75, cpap 14  . Stricture and stenosis of esophagus   . Unspecified essential hypertension     Past Surgical History:  Procedure Laterality Date  . BALLOON DILATION  03/22/2011   Procedure: BALLOON DILATION;  Surgeon: Matthew Castle, MD;  Location: [facility];  Service: Endoscopy;  Laterality: N/A;  [facility]  . CHOLECYSTECTOMY    . ESOPHAGOGASTRODUODENOSCOPY  03/22/2011   Procedure: ESOPHAGOGASTRODUODENOSCOPY (EGD);  Surgeon: Matthew Castle, MD;  Location: [facility];  Service: Endoscopy;  Laterality: N/A;  . PROSTATE SURGERY      Family History  Problem Relation Age of Onset  .  Cancer    . Anesthesia problems Neg Hx   . Hypotension Neg Hx   . Malignant hyperthermia Neg Hx   . Pseudochol deficiency Neg Hx   . Cancer Mother   . Snoring Father      Social History Social History  Substance Use Topics  . Smoking status: Never Smoker  . Smokeless tobacco: Never Used  . Alcohol use 1.2 oz/week    2 Glasses of wine per week   No IV drug use  Allergies  Allergen Reactions  . Metronidazole Anaphylaxis  . Amoxicillin-Pot Clavulanate Other (See Comments)    UNKNOWN  . Ciprofloxacin Other (See Comments)    UNKNOWN  . Mesalamine     REACTION: Intolerance    Current Outpatient Prescriptions  Medication Sig Dispense Refill  . atorvastatin (LIPITOR) 80 MG tablet Take 1 tablet (80 mg total) by mouth daily. 90 tablet 3  . BYSTOLIC 10 MG tablet TAKE 1 TABLET BY MOUTH EVERY DAY 90 tablet 4  . clobetasol (TEMOVATE) 0.05 % external solution Apply 1 application topically 2 (two) times daily. 50 mL 5  . finasteride (PROSCAR) 5 MG tablet TAKE 1 TABLET BY MOUTH EVERY DAY 90 tablet 1  . fluticasone (FLONASE) 50 MCG/ACT nasal spray   11  . gabapentin (NEURONTIN) 300 MG capsule Take 1 capsule (300 mg total) by mouth at bedtime. 30 capsule 3  . Misc. Throat  Products (BREATHE RIGHT SNORE RELIEF MT) Use as directed 1 strip in the mouth or throat at bedtime.    . citalopram (CELEXA) 40 MG tablet TAKE 1 TABLET BY MOUTH EVERY DAY 30 tablet 8   No current facility-administered medications for this visit.       REVIEW OF SYSTEMS (Negative unless checked)  Constitutional: [] Weight loss  [] Fever  [] Chills Cardiac: [] Chest Luna   [] Chest pressure   [] Palpitations   [] Shortness of breath when laying flat   [] Shortness of breath at rest   [] Shortness of breath with exertion. Vascular:  [] Luna in legs with walking   [] Luna in legs at rest   [] Luna in legs when laying flat   [] Claudication   [] Luna in feet when walking  [] Luna in feet at rest  [] Luna in feet when laying flat    [] History of DVT   [] Phlebitis   [] Swelling in legs   [] Varicose veins   [] Non-healing ulcers Pulmonary:   [] Uses home oxygen   [] Productive cough   [] Hemoptysis   [] Wheeze  [x] COPD   [] Asthma Neurologic:  [] Dizziness  [] Blackouts   [] Seizures   [] History of stroke   [] History of TIA  [] Aphasia   [] Temporary blindness   [] Dysphagia   [] Weakness or numbness in arms   [] Weakness or numbness in legs Musculoskeletal:  [x] Arthritis   [] Joint swelling   [] Joint Luna   [] Low back Luna Hematologic:  [] Easy bruising  [] Easy bleeding   [] Hypercoagulable state   [] Anemic  [] Hepatitis Gastrointestinal:  [] Blood in stool   [] Vomiting blood  [] Gastroesophageal reflux/heartburn   [x] Abdominal Luna Genitourinary:  [] Chronic kidney disease   [x] Difficult urination  [x] Frequent urination  [] Burning with urination   [] Hematuria Skin:  [] Rashes   [] Ulcers   [] Wounds Psychological:  [] History of anxiety   []  History of major depression.    Physical Exam BP 110/70 (BP Location: Right Arm)   Pulse 74   Resp 16   Ht 6\' 4"  (1.93 m)   Wt 104.3 kg (230 lb)   BMI 28.00 kg/m  Gen:  WD/WN, NAD Head: [facility]/AT, No temporalis wasting. Prominent temp pulse not noted. Ear/Nose/Throat: Hearing grossly intact, nares w/o erythema or drainage, oropharynx w/o Erythema/Exudate Eyes: Conjunctiva clear, sclera non-icteric  Neck: trachea midline.  No bruit or JVD.  Pulmonary:  Good air movement, clear to auscultation bilaterally.  Cardiac: RRR, normal S1, S2, no Murmurs, rubs or gallops. Vascular:  Vessel Right Left  Radial Palpable Palpable  Ulnar Palpable Palpable  Brachial Palpable Palpable  Carotid Palpable, without bruit Palpable, without bruit  Aorta Enlarged, palpable N/A  Femoral Palpable Palpable  Popliteal Palpable Palpable  PT Palpable Palpable  DP Not Palpable Palpable   Gastrointestinal: soft, non-tender/non-distended. No guarding/reflex. No masses, surgical incisions, or scars. Musculoskeletal: M/S 5/5  throughout.  Extremities without ischemic changes.  No deformity or atrophy. Neurologic: Sensation grossly intact in extremities.  Symmetrical.  Speech is fluent. Motor exam as listed above. Psychiatric: Judgment intact, Mood & affect appropriate for pt's clinical situation. Dermatologic: No rashes or ulcers noted.  No cellulitis or open wounds. Lymph : No Cervical, Axillary, or Inguinal lymphadenopathy.  Radiology No results found.  Labs No results found for this or any previous visit (from the past 2160 hour(s)).  Assessment/Plan:  ABDOMINAL Luna, EPIGASTRIC Although history of leg could be the cause of this, that is not entirely clear and not typical.  Chronic kidney disease, stage 3 Complicates imaging and repair of these AAA  TIA (transient ischemic  attack) blood pressure control important in reducing the progression of atherosclerotic disease and aneurysmal degeneration. On appropriate oral medications.   AAA (abdominal aortic aneurysm) without rupture The patient has several urology issues and follows with his [facility]rologist at [facility]. A recent CT scan that they perform demonstrated an approximately 4.7-4.8 cm infrarenal abdominal aortic aneurysm. Looking back 3 years, this was 4.5 cm. This has had slight growth, but is certainly approaching the size for prophylactic repair of 5 cm. I would plan to see the patient back in about 3 months with a repeat CT scan for further evaluation and potential treatment planning. The pathophysiology and natural history of abdominal aortic aneurysm were discussed with the patient in detail.  CA of prostate Followed by Urology at [facility]      Matthew Luna 12/06/2015, 12:30 PM   This note was created with Dragon medical transcription system.  Any errors from dictation are unintentional.

## 2015-12-22 DIAGNOSIS — R300 Dysuria: Secondary | ICD-10-CM | POA: Diagnosis not present

## 2015-12-22 DIAGNOSIS — R339 Retention of urine, unspecified: Secondary | ICD-10-CM | POA: Diagnosis not present

## 2016-02-01 ENCOUNTER — Ambulatory Visit: Payer: PPO | Admitting: Family Medicine

## 2016-02-21 ENCOUNTER — Other Ambulatory Visit: Payer: Self-pay

## 2016-02-21 ENCOUNTER — Ambulatory Visit: Payer: PPO | Admitting: Gastroenterology

## 2016-02-22 ENCOUNTER — Ambulatory Visit (INDEPENDENT_AMBULATORY_CARE_PROVIDER_SITE_OTHER): Payer: PPO | Admitting: Vascular Surgery

## 2016-02-22 ENCOUNTER — Encounter (INDEPENDENT_AMBULATORY_CARE_PROVIDER_SITE_OTHER): Payer: Self-pay | Admitting: Vascular Surgery

## 2016-02-22 VITALS — BP 115/70 | HR 59 | Resp 16 | Wt 232.0 lb

## 2016-02-22 DIAGNOSIS — N183 Chronic kidney disease, stage 3 unspecified: Secondary | ICD-10-CM

## 2016-02-22 DIAGNOSIS — I714 Abdominal aortic aneurysm, without rupture, unspecified: Secondary | ICD-10-CM

## 2016-02-22 DIAGNOSIS — I10 Essential (primary) hypertension: Secondary | ICD-10-CM

## 2016-02-22 NOTE — Assessment & Plan Note (Signed)
blood pressure control important in reducing the progression of atherosclerotic disease and aneurysmal disease. On appropriate oral medications.  

## 2016-02-22 NOTE — Assessment & Plan Note (Signed)
Will need to limit contrast with any endovascular repair.

## 2016-02-22 NOTE — Patient Instructions (Signed)
Abdominal Aortic Aneurysm Blood pumps away from the heart through tubes (blood vessels) called arteries. Aneurysms are weak or damaged places in the wall of an artery. It bulges out like a balloon. An abdominal aortic aneurysm happens in the main artery of the body (aorta). It can burst or tear, causing bleeding inside the body. This is an emergency. It needs treatment right away. What are the causes? The exact cause is unknown. Things that could cause this problem include:  Fat and other substances building up in the lining of a tube.  Swelling of the walls of a blood vessel.  Certain tissue diseases.  Belly (abdominal) trauma.  An infection in the main artery of the body.  What increases the risk? There are things that make it more likely for you to have an aneurysm. These include:  Being over the age of 75 years old.  Having high blood pressure (hypertension).  Being a male.  Being white.  Being very overweight (obese).  Having a family history of aneurysm.  Using tobacco products.  What are the signs or symptoms? Symptoms depend on the size of the aneurysm and how fast it grows. There may not be symptoms. If symptoms occur, they can include:  Pain (belly, side, lower back, or groin).  Feeling full after eating a small amount of food.  Feeling sick to your stomach (nauseous), throwing up (vomiting), or both.  Feeling a lump in your belly that feels like it is beating (pulsating).  Feeling like you will pass out (faint).  How is this treated?  Medicine to control blood pressure and pain.  Imaging tests to see if the aneurysm gets bigger.  Surgery. How is this prevented? To lessen your chance of getting this condition:  Stop smoking. Stop chewing tobacco.  Limit or avoid alcohol.  Keep your blood pressure, blood sugar, and cholesterol within normal limits.  Eat less salt.  Eat foods low in saturated fats and cholesterol. These are found in animal and  whole dairy products.  Eat more fiber. Fiber is found in whole grains, vegetables, and fruits.  Keep a healthy weight.  Stay active and exercise often.  This information is not intended to replace advice given to you by your health care provider. Make sure you discuss any questions you have with your health care provider. Document Released: 05/13/2012 Document Revised: 06/24/2015 Document Reviewed: 02/16/2012 Elsevier Interactive Patient Education  2017 [employer]  

## 2016-02-22 NOTE — Assessment & Plan Note (Signed)
He is going to be getting a CT scan from his urologist in the near future. I have asked that they send [place] those results. Given his size approaching repair, continued close surveillance be necessary and I will plan on seeing him back in about 4 months with a duplex for follow-up.

## 2016-02-22 NOTE — Progress Notes (Signed)
MRN : [id number]  Matthew Luna is a 76 y.o. (28-Apr-1940) male who presents with chief complaint of  Chief Complaint  Patient presents with  . Follow-up  .  History of Present Illness: Patient returns today in follow up of AAA. He has not yet had his scan and is scheduled to see his urologist tomorrow who performs his CT scans every 3-4 months. He is having some abdominal fullness but no new back or abdominal pain. He has no signs of peripheral embolization.        Past Medical History:  Diagnosis Date  . AAA (abdominal aortic aneurysm) ([place])   . Actinic keratosis   . Diverticulosis of colon (without mention of hemorrhage)   . Duodenitis without mention of hemorrhage   . Esophageal reflux   . Hypertrophy of prostate without urinary obstruction and other lower urinary tract symptoms (LUTS)   . Left sided ulcerative (chronic) colitis ([place])   . Mononeuritis of unspecified site   . Nonspecific abnormal results of thyroid function study   . Prostate cancer ([place])    cryotherapy 2011 [facility])  . Sleep apnea    ahi 75, cpap 14  . Stricture and stenosis of esophagus   . Unspecified essential hypertension          Past Surgical History:  Procedure Laterality Date  . BALLOON DILATION  03/22/2011   Procedure: BALLOON DILATION;  Surgeon: Inda Castle, MD;  Location: [facility];  Service: Endoscopy;  Laterality: N/A;  kelly/ebp  . CHOLECYSTECTOMY    . ESOPHAGOGASTRODUODENOSCOPY  03/22/2011   Procedure: ESOPHAGOGASTRODUODENOSCOPY (EGD);  Surgeon: Inda Castle, MD;  Location: [facility];  Service: Endoscopy;  Laterality: N/A;  . PROSTATE SURGERY           Family History  Problem Relation Age of Onset  . Cancer    . Anesthesia problems Neg Hx   . Hypotension Neg Hx   . Malignant hyperthermia Neg Hx   . Pseudochol deficiency Neg Hx   . Cancer Mother   . Snoring Father      Social History       Social History   Substance Use  Topics   . Smoking status: Never Smoker   . Smokeless tobacco: Never Used   . Alcohol use 1.2 oz/week    2 Glasses of wine per week   No IV drug use       Allergies  Allergen Reactions  . Metronidazole Anaphylaxis  . Amoxicillin-Pot Clavulanate Other (See Comments)    UNKNOWN  . Ciprofloxacin Other (See Comments)    UNKNOWN  . Mesalamine     REACTION: Intolerance          Current Outpatient Prescriptions  Medication Sig Dispense Refill  . atorvastatin (LIPITOR) 80 MG tablet Take 1 tablet (80 mg total) by mouth daily. 90 tablet 3  . BYSTOLIC 10 MG tablet TAKE 1 TABLET BY MOUTH EVERY DAY 90 tablet 4  . clobetasol (TEMOVATE) 0.05 % external solution Apply 1 application topically 2 (two) times daily. 50 mL 5  . finasteride (PROSCAR) 5 MG tablet TAKE 1 TABLET BY MOUTH EVERY DAY 90 tablet 1  . fluticasone (FLONASE) 50 MCG/ACT nasal spray   11  . gabapentin (NEURONTIN) 300 MG capsule Take 1 capsule (300 mg total) by mouth at bedtime. 30 capsule 3  . Misc. Throat Products (BREATHE RIGHT SNORE RELIEF MT) Use as directed 1 strip in the mouth or throat at bedtime.    Marland Kitchen  citalopram (CELEXA) 40 MG tablet TAKE 1 TABLET BY MOUTH EVERY DAY 30 tablet 8   No current facility-administered medications for this visit.       REVIEW OF SYSTEMS (Negative unless checked)  Constitutional: [] Weight loss  [] Fever  [] Chills Cardiac: [] Chest pain   [] Chest pressure   [] Palpitations   [] Shortness of breath when laying flat   [] Shortness of breath at rest   [] Shortness of breath with exertion. Vascular:  [] Pain in legs with walking   [] Pain in legs at rest   [] Pain in legs when laying flat   [] Claudication   [] Pain in feet when walking  [] Pain in feet at rest  [] Pain in feet when laying flat   [] History of DVT   [] Phlebitis   [] Swelling in legs   [] Varicose veins   [] Non-healing ulcers Pulmonary:   [] Uses home oxygen   [] Productive cough   [] Hemoptysis   [] Wheeze  [x] COPD    [] Asthma Neurologic:  [] Dizziness  [] Blackouts   [] Seizures   [] History of stroke   [] History of TIA  [] Aphasia   [] Temporary blindness   [] Dysphagia   [] Weakness or numbness in arms   [] Weakness or numbness in legs Musculoskeletal:  [x] Arthritis   [] Joint swelling   [] Joint pain   [] Low back pain Hematologic:  [] Easy bruising  [] Easy bleeding   [] Hypercoagulable state   [] Anemic  [] Hepatitis Gastrointestinal:  [] Blood in stool   [] Vomiting blood  [] Gastroesophageal reflux/heartburn   [x] Abdominal pain Genitourinary:  [] Chronic kidney disease   [x] Difficult urination  [x] Frequent urination  [] Burning with urination   [] Hematuria Skin:  [] Rashes   [] Ulcers   [] Wounds Psychological:  [] History of anxiety   []  History of major depression.    Physical Exam BP 110/70 (BP Location: Right Arm)   Pulse 74   Resp 16   Ht 6\' 4"  (1.93 m)   Wt 104.3 kg (230 lb)   BMI 28.00 kg/m  Gen:  WD/WN, NAD Head: [place]/AT, No temporalis wasting. Prominent temp pulse not noted. Ear/Nose/Throat: Hearing grossly intact, nares w/o erythema or drainage, oropharynx w/o Erythema/Exudate Eyes: Conjunctiva clear, sclera non-icteric  Neck: trachea midline.  No JVD.  Pulmonary:  Good air movement, no use of accessory muscles Cardiac: RRR, normal S1, S2 Vascular:  Vessel Right Left  Radial Palpable Palpable                                   Gastrointestinal: soft, non-tender/non-distended. No guarding/reflex. No masses, surgical incisions, or scars. Aortic impulse enlarged. Musculoskeletal: M/S 5/5 throughout.  Extremities without ischemic changes.  No deformity or atrophy. Neurologic: Sensation grossly intact in extremities.  Symmetrical.  Speech is fluent. Motor exam as listed above. Psychiatric: Judgment intact, Mood & affect appropriate for pt's clinical situation. Dermatologic: No rashes or ulcers noted.  No cellulitis or open wounds. Lymph : No Cervical, Axillary, or Inguinal  lymphadenopathy.   Labs No results found for this or any previous visit (from the past 2160 hour(s)).  Radiology No results found.   Assessment/Plan  Essential hypertension blood pressure control important in reducing the progression of atherosclerotic disease and aneurysmal disease. On appropriate oral medications.   Chronic kidney disease, stage 3 Will need to limit contrast with any endovascular repair.  AAA (abdominal aortic aneurysm) without rupture He is going to be getting a CT scan from his urologist in the near future. I have asked that they send [place] those results.  Given his size approaching repair, continued close surveillance be necessary and I will plan on seeing him back in about 4 months with a duplex for follow-up.    Leotis Pain, MD  02/22/2016 10:46 AM    This note was created with Dragon medical transcription system.  Any errors from dictation are purely unintentional

## 2016-02-23 DIAGNOSIS — N261 Atrophy of kidney (terminal): Secondary | ICD-10-CM | POA: Diagnosis not present

## 2016-02-23 DIAGNOSIS — R350 Frequency of micturition: Secondary | ICD-10-CM | POA: Diagnosis not present

## 2016-02-23 DIAGNOSIS — I701 Atherosclerosis of renal artery: Secondary | ICD-10-CM | POA: Diagnosis not present

## 2016-02-23 DIAGNOSIS — C61 Malignant neoplasm of prostate: Secondary | ICD-10-CM | POA: Diagnosis not present

## 2016-02-23 DIAGNOSIS — N200 Calculus of kidney: Secondary | ICD-10-CM | POA: Diagnosis not present

## 2016-02-23 DIAGNOSIS — R339 Retention of urine, unspecified: Secondary | ICD-10-CM | POA: Diagnosis not present

## 2016-02-28 ENCOUNTER — Telehealth (INDEPENDENT_AMBULATORY_CARE_PROVIDER_SITE_OTHER): Payer: Self-pay

## 2016-02-28 NOTE — Telephone Encounter (Signed)
Patient called asking questions on when and where he should get a more recent ultrasound done at.

## 2016-02-28 NOTE — Telephone Encounter (Signed)
Patient dropped off imaging disc for review by Dr. Lucky Cowboy. Dr. Lucky Cowboy took a look at the disc and determined that the patient needed a more up-to-date- scan so that he can determine what actions needed to take place in order for him to address the issue of his DVT. I called the patient back to inform him of Dr. Bunnie Domino decision and the patient stated that he would contact his primary care to set up a new ultrasound, and that he would drop the new disc off after he has the scan done.

## 2016-02-29 ENCOUNTER — Telehealth (INDEPENDENT_AMBULATORY_CARE_PROVIDER_SITE_OTHER): Payer: Self-pay

## 2016-02-29 ENCOUNTER — Other Ambulatory Visit (INDEPENDENT_AMBULATORY_CARE_PROVIDER_SITE_OTHER): Payer: Self-pay | Admitting: Vascular Surgery

## 2016-02-29 DIAGNOSIS — I714 Abdominal aortic aneurysm, without rupture, unspecified: Secondary | ICD-10-CM

## 2016-02-29 NOTE — Telephone Encounter (Signed)
Patient called to cancel CT appointment made by [facility] and stated that he will go the appointment made by his primary doctor.

## 2016-03-08 ENCOUNTER — Ambulatory Visit: Admission: RE | Admit: 2016-03-08 | Payer: PPO | Source: Ambulatory Visit

## 2016-03-10 ENCOUNTER — Ambulatory Visit (INDEPENDENT_AMBULATORY_CARE_PROVIDER_SITE_OTHER): Payer: PPO | Admitting: Vascular Surgery

## 2016-03-10 ENCOUNTER — Encounter: Payer: Self-pay | Admitting: Physician Assistant

## 2016-03-13 ENCOUNTER — Encounter: Payer: Self-pay | Admitting: Physician Assistant

## 2016-03-15 ENCOUNTER — Ambulatory Visit: Payer: PPO | Admitting: Physician Assistant

## 2016-03-16 ENCOUNTER — Ambulatory Visit
Admission: RE | Admit: 2016-03-16 | Discharge: 2016-03-16 | Disposition: A | Discharge status: Home / Self Care | Payer: PPO | Source: Ambulatory Visit | Attending: Vascular Surgery | Admitting: Vascular Surgery

## 2016-03-16 DIAGNOSIS — R590 Localized enlarged lymph nodes: Secondary | ICD-10-CM | POA: Insufficient documentation

## 2016-03-16 DIAGNOSIS — I714 Abdominal aortic aneurysm, without rupture, unspecified: Secondary | ICD-10-CM

## 2016-03-16 DIAGNOSIS — I723 Aneurysm of iliac artery: Secondary | ICD-10-CM | POA: Insufficient documentation

## 2016-03-16 DIAGNOSIS — N261 Atrophy of kidney (terminal): Secondary | ICD-10-CM | POA: Insufficient documentation

## 2016-03-16 DIAGNOSIS — N280 Ischemia and infarction of kidney: Secondary | ICD-10-CM | POA: Insufficient documentation

## 2016-03-16 DIAGNOSIS — K5730 Diverticulosis of large intestine without perforation or abscess without bleeding: Secondary | ICD-10-CM | POA: Insufficient documentation

## 2016-03-16 DIAGNOSIS — K449 Diaphragmatic hernia without obstruction or gangrene: Secondary | ICD-10-CM | POA: Insufficient documentation

## 2016-03-16 LAB — POCT I-STAT CREATININE: Creatinine, Ser: 1.6 mg/dL — ABNORMAL HIGH (ref 0.61–1.24)

## 2016-03-16 MED ORDER — IOPAMIDOL (ISOVUE-370) INJECTION 76%
80.0000 mL | Freq: Once | INTRAVENOUS | Status: AC | PRN
  Administered 2016-03-16: 80 mL via INTRAVENOUS

## 2016-03-17 ENCOUNTER — Ambulatory Visit (INDEPENDENT_AMBULATORY_CARE_PROVIDER_SITE_OTHER): Payer: PPO | Admitting: Vascular Surgery

## 2016-03-17 ENCOUNTER — Encounter (INDEPENDENT_AMBULATORY_CARE_PROVIDER_SITE_OTHER): Payer: Self-pay | Admitting: Vascular Surgery

## 2016-03-17 VITALS — BP 120/84 | HR 57 | Resp 17 | Wt 232.0 lb

## 2016-03-17 DIAGNOSIS — N183 Chronic kidney disease, stage 3 unspecified: Secondary | ICD-10-CM

## 2016-03-17 DIAGNOSIS — I714 Abdominal aortic aneurysm, without rupture, unspecified: Secondary | ICD-10-CM

## 2016-03-17 DIAGNOSIS — I10 Essential (primary) hypertension: Secondary | ICD-10-CM

## 2016-03-17 NOTE — Progress Notes (Signed)
MRN : [id number]  Matthew Luna is a 76 y.o. (10/14/1940) male who presents with chief complaint of  Chief Complaint  Patient presents with  . Follow-up  .  History of Present Illness: Patient returns today in follow up of AAA. He has no aneurysm related symptoms and no new complaints today from his previous visit last month. He has undergone a CT angiogram of the abdomen and pelvis.  I have independently reviewed his CT angiogram would agree with the 5.1 cm infrarenal abdominal aortic aneurysm measurement that was made by the radiologist. He also has right renal atrophy      Past Medical History:  Diagnosis Date  . AAA (abdominal aortic aneurysm) ([place])   . Actinic keratosis   . Diverticulosis of colon (without mention of hemorrhage)   . Duodenitis without mention of hemorrhage   . Esophageal reflux   . Hypertrophy of prostate without urinary obstruction and other lower urinary tract symptoms (LUTS)   . Left sided ulcerative (chronic) colitis ([place])   . Mononeuritis of unspecified site   . Nonspecific abnormal results of thyroid function study   . Prostate cancer ([place])    cryotherapy 2011 [facility])  . Sleep apnea    ahi 75, cpap 14  . Stricture and stenosis of esophagus   . Unspecified essential hypertension          Past Surgical History:  Procedure Laterality Date  . BALLOON DILATION  03/22/2011   Procedure: BALLOON DILATION; Surgeon: Inda Castle, MD; Location: [facility]; Service: Endoscopy; Laterality: N/A; kelly/ebp  . CHOLECYSTECTOMY    . ESOPHAGOGASTRODUODENOSCOPY  03/22/2011   Procedure: ESOPHAGOGASTRODUODENOSCOPY (EGD); Surgeon: Inda Castle, MD; Location: [facility]; Service: Endoscopy; Laterality: N/A;  . PROSTATE SURGERY           Family History  Problem Relation Age of Onset  . Cancer    . Anesthesia problems Neg Hx   . Hypotension Neg Hx   . Malignant hyperthermia Neg Hx   . Pseudochol  deficiency Neg Hx   . Cancer Mother   . Snoring Father      Social History       Social History   Substance Use Topics   . Smoking status: Never Smoker   . Smokeless tobacco: Never Used   . Alcohol use 1.2 oz/week    2 Glasses of wine per week   No IV drug use       Allergies  Allergen Reactions  . Metronidazole Anaphylaxis  . Amoxicillin-Pot Clavulanate Other (See Comments)    UNKNOWN  . Ciprofloxacin Other (See Comments)    UNKNOWN  . Mesalamine     REACTION: Intolerance          Current Outpatient Prescriptions  Medication Sig Dispense Refill  . atorvastatin (LIPITOR) 80 MG tablet Take 1 tablet (80 mg total) by mouth daily. 90 tablet 3  . BYSTOLIC 10 MG tablet TAKE 1 TABLET BY MOUTH EVERY DAY 90 tablet 4  . clobetasol (TEMOVATE) 0.05 % external solution Apply 1 application topically 2 (two) times daily. 50 mL 5  . finasteride (PROSCAR) 5 MG tablet TAKE 1 TABLET BY MOUTH EVERY DAY 90 tablet 1  . fluticasone (FLONASE) 50 MCG/ACT nasal spray   11  . gabapentin (NEURONTIN) 300 MG capsule Take 1 capsule (300 mg total) by mouth at bedtime. 30 capsule 3  . Misc. Throat Products (BREATHE RIGHT SNORE RELIEF MT) Use as directed 1 strip in the mouth or throat at  bedtime.    . citalopram (CELEXA) 40 MG tablet TAKE 1 TABLET BY MOUTH EVERY DAY 30 tablet 8   No current facility-administered medications for this visit.       REVIEW OF SYSTEMS(Negative unless checked)  Constitutional: [] Weight loss[] Fever[] Chills Cardiac:[] Chest pain[] Chest pressure[] Palpitations [] Shortness of breath when laying flat [] Shortness of breath at rest [] Shortness of breath with exertion. Vascular: [] Pain in legs with walking[] Pain in legsat rest[] Pain in legs when laying flat [] Claudication [] Pain in feet when walking [] Pain in feet at rest [] Pain in feet when laying flat [] History of DVT [] Phlebitis [] Swelling in  legs [] Varicose veins [] Non-healing ulcers Pulmonary: [] Uses home oxygen [] Productive cough[] Hemoptysis [] Wheeze [x] COPD [] Asthma Neurologic: [] Dizziness [] Blackouts [] Seizures [] History of stroke [] History of TIA[] Aphasia [] Temporary blindness[] Dysphagia [] Weaknessor numbness in arms [] Weakness or numbnessin legs Musculoskeletal: [x] Arthritis [] Joint swelling [] Joint pain [] Low back pain Hematologic:[] Easy bruising[] Easy bleeding [] Hypercoagulable state [] Anemic [] Hepatitis Gastrointestinal:[] Blood in stool[] Vomiting blood[] Gastroesophageal reflux/heartburn[x] Abdominal pain Genitourinary: [] Chronic kidney disease [x] Difficulturination [x] Frequenturination [] Burning with urination[] Hematuria Skin: [] Rashes [] Ulcers [] Wounds Psychological: [] History of anxiety[] History of major depression.    Physical Exam BP 110/70 (BP Location: Right Arm)  Pulse 74  Resp 16  Ht 6\' 4"  (1.93 m)  Wt 104.3 kg (230 lb)  BMI 28.00 kg/m  Gen: WD/WN, NAD Head: [place]/AT, No temporalis wasting. Prominent temp pulse not noted. Ear/Nose/Throat: Hearing grossly intact, nares w/o erythema or drainage, oropharynx w/o Erythema/Exudate Eyes: Conjunctiva clear, sclera non-icteric  Neck: trachea midline. No JVD.  Pulmonary: Good air movement, no use of accessory muscles Cardiac: RRR, normal S1, S2 Vascular:  Vessel Right Left  Radial Palpable Palpable                                   Gastrointestinal: soft, non-tender/non-distended. No guarding/reflex. No masses, surgical incisions, or scars. Aortic impulse enlarged. Musculoskeletal: M/S 5/5 throughout. Extremities without ischemic changes. No deformity or atrophy. Neurologic: Sensation grossly intact in extremities. Symmetrical. Speech is fluent. Motor exam as listed above. Psychiatric: Judgment intact, Mood & affect appropriate for pt's  clinical situation. Dermatologic: No rashes or ulcers noted. No cellulitis or open wounds. Lymph : No Cervical, Axillary, or Inguinal lymphadenopathy.     Labs Recent Results (from the past 2160 hour(s))  I-STAT creatinine     Status: Abnormal   Collection Time: 03/16/16 10:10 AM  Result Value Ref Range   Creatinine, Ser 1.60 (H) 0.61 - 1.24 mg/dL    Radiology Ct Angio Abdomen Pelvis  W &/or Wo Contrast  Result Date: 03/16/2016 CLINICAL DATA:  F/U CT Angio A/P 11/04/2013. Pt c/o Right flank + RLQ pain intermittent worsening in the last 3 months. Hx Kidney stones. NKI No hx CA. Hx Prostate surg for BPH. EXAM: CTA ABDOMEN AND PELVIS WITH CONTRAST TECHNIQUE: Multidetector CT imaging of the abdomen and pelvis was performed using the standard protocol during bolus administration of intravenous contrast. Multiplanar reconstructed images and MIPs were obtained and reviewed to evaluate the vascular anatomy. CONTRAST:  80 mL Isovue 370 IV COMPARISON:  11/04/2013 and previous FINDINGS: VASCULAR Coronary calcifications. Aorta: Tortuous atheromatous visualized distal descending thoracic segment. Progressive nonocclusive mural thrombus in the suprarenal segment. Infrarenal fusiform aneurysm measured up to 5.1 cm maximum diameter (previously 4.1 cm), tapering to a diameter of 2.7 cm at the bifurcation. No dissection or stenosis. Celiac: Patent without evidence of aneurysm, dissection, vasculitis or significant stenosis. SMA: Patent without evidence of aneurysm, dissection, vasculitis or significant stenosis. Renals: Partially calcified left renal ostial  plaque without high-grade stenosis, patent distally. Chronic occlusion through the length of the right renal artery. IMA: Patent with short-segment origin stenosis, arising from the aneurysmal segment. Inflow: Fusiform ectasia of left common iliac up to 17 mm diameter, right common iliac up to 19 mm. Fusiform ectasia of right internal iliac artery up to 15 mm  diameter. The external iliac arteries are mildly tortuous without aneurysm, dissection, or stenosis. Proximal Outflow: Bilateral common femoral and visualized portions of the superficial and profunda femoral arteries are patent without evidence of aneurysm, dissection, vasculitis or significant stenosis. Veins: Patent hepatic veins, portal vein, SMV, splenic vein, bilateral renal veins, IVC. Incomplete opacification of the iliac venous systems probably secondary to scan timing. Review of the MIP images confirms the above findings. NON-VASCULAR Lower chest: No pleural or pericardial effusion. Visualized lung bases clear. Hepatobiliary: No focal liver abnormality is seen. Status post cholecystectomy. No biliary dilatation. Pancreas: Unremarkable. No pancreatic ductal dilatation or surrounding inflammatory changes. Spleen: Normal in size without focal abnormality. Adrenals/Urinary Tract: Chronic right renal parenchymal atrophy with heterogeneous collateral perfusion. Multiple left parapelvic cysts. Stable subcentimeter probable cyst, left lower pole. No solid renal mass or hydronephrosis. The ureters are decompressed. Urinary bladder incompletely distended. Stomach/Bowel: Small hiatal hernia. Stomach nondistended. Small bowel decompressed. Normal appendix. The colon is nondilated. Innumerable distal descending and sigmoid diverticula without significant adjacent inflammatory/ edematous change. Lymphatic: A few subcentimeter bilateral external iliac chain lymph nodes as before. Slight increase in prominence of retroperitoneal and periaortic adenopathy, none greater than 1 cm short axis diameter. No mesenteric adenopathy. Reproductive: Coarse calcifications centrally in the prostate. Other: No ascites.  No free air. Musculoskeletal: Left inguinal hernia containing only fat. Spondylitic changes L2-L5. No fracture or worrisome bone lesion. IMPRESSION: VASCULAR 1. Enlarging 5.1 cm infrarenal abdominal aortic aneurysm,  previously 4.1 cm 11/04/2013. Recommend followup by abdomen and pelvis CTA in 3-6 months, and vascular surgery referral/consultation if not already obtained. This recommendation follows ACR consensus guidelines: White Paper of the ACR Incidental Findings Committee II on Vascular Findings. J Am Coll Radiol 2013; 10:789-794. 2. Dilated common iliac arteries measuring 17 mm left,  19 mm right. 3. 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 NON-VASCULAR 1. Slight enlargement of retroperitoneal lymph nodes, possibly reactive but nonspecific. Stable bilateral small pelvic lymph nodes. 2. Chronic right renal atrophy. 3. Small hiatal hernia 4. Sigmoid and descending colon diverticulosis Electronically Signed   By: Lucrezia Europe M.D.   On: 03/16/2016 12:40     Assessment/Plan  Essential hypertension blood pressure control important in reducing the progression of atherosclerotic disease and aneurysmal disease. On appropriate oral medications.   Chronic kidney disease, stage 3 Will need to limit contrast with any endovascular repair.  AAA (abdominal aortic aneurysm) without rupture I have independently reviewed his CT angiogram would agree with the 5.1 cm infrarenal abdominal aortic aneurysm measurement that was made by the radiologist. He also has right renal atrophy. The patient has had significant enlargement of his abdominal aortic aneurysm now measuring 5.1 cm in maximum diameter. This has shown a significant enlargement and is a much more worrisome situation at this point. At this size, he reaches threshold for prophylactic repair. He should clearly have this fixed before he considers any other surgical therapy for his kidney stones or other reasons. He does have anatomy that is amenable to endovascular repair. I have discussed the risks and benefits of the procedure with the patient and his wife. They are agreeable to proceed and this  will be scheduled in the near future at his  convenience.    Leotis Pain, MD  03/17/2016 12:54 PM    This note was created with Dragon medical transcription system.  Any errors from dictation are purely unintentional

## 2016-03-17 NOTE — Patient Instructions (Signed)
Abdominal Aortic Aneurysm Endograft Repair Abdominal aortic aneurysm endograft repair is a surgery to fix an aortic aneurysm in the abdominal area. An aneurysm is a weak or damaged part of an artery wall that bulges out from the normal force of blood pumping through the body. An abdominal aortic aneurysm is an aneurysm that happens in the lower part of the aorta, which is the main artery of the body. The repair is often done if the aneurysm gets so large that it might burst (rupture). A ruptured aneurysm would cause bleeding inside the body that could put a person's life in danger. Before that happens, this procedure is needed to fix the problem. The procedure may also be done if the aneurysm causes symptoms such as pain in the back, abdomen, or side. In this procedure, a tube made of fabric and metal mesh (endograft or stent-graft) is placed in the weak part of the aorta to repair it. Tell a health care provider about:  Any allergies you have.  All medicines you are taking, including vitamins, herbs, eye drops, creams, and over-the-counter medicines.  Any problems you or family members have had with anesthetic medicines.  Any blood disorders you have.  Any surgeries you have had.  Any medical conditions you have.  Whether you are pregnant or may be pregnant. What are the risks? Generally, this is a safe procedure. However, problems may occur, including:  Infection of the graft or incision area.  Bleeding during the procedure or from the incision site.  Allergic reactions to medicines.  Damage to other structures or organs.  Blood leaking out around the endograft.  The endograft moving from where it was placed during surgery.  Blood flow through the graft becoming blocked.  Blood clots.  Kidney problems.  Blood flow to the legs becoming blocked (rare).  Rupture of the aorta even after the endograft repair is a success (rare). What happens before the procedure? Staying  hydrated  Follow instructions from your health care provider about hydration, which may include:  Up to 2 hours before the procedure - you may continue to drink clear liquids, such as water, clear fruit juice, black coffee, and plain tea. Eating and drinking restrictions  Follow instructions from your health care provider about eating and drinking, which may include:  8 hours before the procedure - stop eating heavy meals or foods such as meat, fried foods, or fatty foods.  6 hours before the procedure - stop eating light meals or foods, such as toast or cereal.  6 hours before the procedure - stop drinking milk or drinks that contain milk.  2 hours before the procedure - stop drinking clear liquids. Medicines  Ask your health care provider about:  Changing or stopping your regular medicines. This is especially important if you are taking diabetes medicines or blood thinners.  Taking medicines such as aspirin and ibuprofen. These medicines can thin your blood. Do not take these medicines before your procedure if your health care provider instructs you not to.  You may be given antibiotic medicine to help prevent infection. General instructions  You may need to have blood tests, a test to check heart rhythm (electrocardiogram, or ECG), or a test to check blood flow (angiogram) before the surgery.  Imaging tests will be done to check the size and location of the aneurysm. These tests could include an ultrasound, a CT scan, or an MRI.  Do not use any products that contain nicotine or tobacco-such as cigarettes and e-cigarettes-for   as long as possible before the surgery. If you need help quitting, ask your health care provider.  Ask your health care provider how your surgical site will be marked or identified.  Plan to have someone take you home from the hospital or clinic. What happens during the procedure?  To reduce your risk of infection:  Your health care team will wash or  sanitize their hands.  Your skin will be washed with soap.  Hair may be removed from the surgical area.  An IV tube will be inserted into one of your veins.  You will be given one or more of the following:  A medicine to help you relax (sedative).  A medicine to numb the area (local anesthetic).  A medicine to make you fall asleep (general anesthetic).  A medicine that is injected into an area of your body to numb everything below the injection site (regional anesthetic).  During the surgery:  Small incisions or a puncture will be made on one or both sides of the groin. Long, thin tubes (catheters) will be passed through the opening, put into the artery in your thigh, and moved up into the aneurysm in the aorta.  The health care provider will use live X-ray pictures to guide the endograft through the catheterto the place where the aneurysm is.  The endograft will be released to seal off the aneurysm and to line the aorta. It will keep blood from flowing into the aneurysm and will help keep it from rupturing. The endograft will stay in place and will not be taken out.  X-rays will be used to check where the endograft is placed and to make sure that it is where it should be.  The catheter will be taken out, and the incision will be closed with stitches (sutures). The procedure may vary among health care providers and hospitals. What happens after the procedure?  Your blood pressure, heart rate, breathing rate, and blood oxygen level will be monitored until the medicines you were given have worn off.  You will need to lie flat for a number of hours. Bending your legs can cause them to bleed and swell.  You will then be urged to get up and move around a number of times each day and to slowly become more active.  You will be given medicines to control pain.  Certain tests may be done after your procedure to check how well the endograft is working and to check its placement.  Do  not drive for 24 hours if you received a sedative. This information is not intended to replace advice given to you by your health care provider. Make sure you discuss any questions you have with your health care provider. Document Released: 06/04/2008 Document Revised: 08/06/2015 Document Reviewed: 04/12/2015 Elsevier Interactive Patient Education  2017 [employer]  

## 2016-03-17 NOTE — Assessment & Plan Note (Signed)
I have independently reviewed his CT angiogram would agree with the 5.1 cm infrarenal abdominal aortic aneurysm measurement that was made by the radiologist. He also has right renal atrophy. The patient has had significant enlargement of his abdominal aortic aneurysm now measuring 5.1 cm in maximum diameter. This has shown a significant enlargement and is a much more worrisome situation at this point. At this size, he reaches threshold for prophylactic repair. He should clearly have this fixed before he considers any other surgical therapy for his kidney stones or other reasons. He does have anatomy that is amenable to endovascular repair. I have discussed the risks and benefits of the procedure with the patient and his wife. They are agreeable to proceed and this will be scheduled in the near future at his convenience.

## 2016-03-20 ENCOUNTER — Ambulatory Visit: Payer: PPO | Admitting: Physician Assistant

## 2016-03-20 ENCOUNTER — Other Ambulatory Visit (INDEPENDENT_AMBULATORY_CARE_PROVIDER_SITE_OTHER): Payer: Self-pay

## 2016-03-21 ENCOUNTER — Ambulatory Visit: Payer: PPO | Admitting: Physician Assistant

## 2016-03-21 ENCOUNTER — Telehealth: Payer: Self-pay | Admitting: Cardiology

## 2016-03-21 NOTE — Telephone Encounter (Signed)
Received Cardiac Clearance from [facility] for patient to have Endovascular AAA repair on 03/29/16. Patient to be seen as new patient today with Dr. Yvone Neu. Cardiac clearance to be routed to Fax # [phone]. Form placed in red folder in "To Do" bin on Pamela's desk.

## 2016-03-22 ENCOUNTER — Ambulatory Visit (INDEPENDENT_AMBULATORY_CARE_PROVIDER_SITE_OTHER): Payer: PPO | Admitting: Cardiology

## 2016-03-22 ENCOUNTER — Encounter: Payer: Self-pay | Admitting: Cardiology

## 2016-03-22 VITALS — BP 100/74 | HR 60 | Ht 75.0 in | Wt 232.0 lb

## 2016-03-22 DIAGNOSIS — I10 Essential (primary) hypertension: Secondary | ICD-10-CM | POA: Diagnosis not present

## 2016-03-22 DIAGNOSIS — Z01810 Encounter for preprocedural cardiovascular examination: Secondary | ICD-10-CM

## 2016-03-22 DIAGNOSIS — R9431 Abnormal electrocardiogram [ECG] [EKG]: Secondary | ICD-10-CM

## 2016-03-22 DIAGNOSIS — I714 Abdominal aortic aneurysm, without rupture, unspecified: Secondary | ICD-10-CM

## 2016-03-22 NOTE — Patient Instructions (Addendum)
Testing/Procedures: Your physician has requested that you have an echocardiogram. Echocardiography is a painless test that uses sound waves to create images of your heart. It provides your doctor with information about the size and shape of your heart and how well your heart's chambers and valves are working. This procedure takes approximately one hour. There are no restrictions for this procedure.  [place]  Your caregiver has ordered a Stress Test with nuclear imaging. The purpose of this test is to evaluate the blood supply to your heart muscle. This procedure is referred to as a "Non-Invasive Stress Test." This is because other than having an IV started in your vein, nothing is inserted or "invades" your body. Cardiac stress tests are done to find areas of poor blood flow to the heart by determining the extent of coronary artery disease (CAD). Some patients exercise on a treadmill, which naturally increases the blood flow to your heart, while others who are  unable to walk on a treadmill due to physical limitations have a pharmacologic/chemical stress agent called Lexiscan . This medicine will mimic walking on a treadmill by temporarily increasing your coronary blood flow.   Please note: these test may take anywhere between 2-4 hours to complete  PLEASE REPORT TO [place] AT THE FIRST DESK WILL DIRECT YOU WHERE TO GO  Date of Procedure:_Tuesday March 28, 2016 at 09:30AM__  Arrival Time for Procedure:_Arrive at 09:15AM to register_  Instructions regarding medication:   __X__:  Hold Bystolic the night before procedure and morning of procedure    PLEASE NOTIFY THE OFFICE AT LEAST 24 HOURS IN ADVANCE IF YOU ARE UNABLE TO KEEP YOUR APPOINTMENT.  [phone] AND  PLEASE NOTIFY NUCLEAR MEDICINE AT [facility] AT LEAST 24 HOURS IN ADVANCE IF YOU ARE UNABLE TO KEEP YOUR APPOINTMENT. [phone]  How to prepare for your Myoview test:  1. Do not eat or drink  after midnight 2. No caffeine for 24 hours prior to test 3. No smoking 24 hours prior to test. 4. Your medication may be taken with water.  If your doctor stopped a medication because of this test, do not take that medication. 5. Ladies, please do not wear dresses.  Skirts or pants are appropriate. Please wear a short sleeve shirt. 6. No perfume, cologne or lotion. 7. Wear comfortable walking shoes. No heels!   Follow-Up: Your physician recommends that you schedule a follow-up appointment as needed. We will call you with results and if needed schedule follow up at that time.   It was a pleasure seeing you today here in the office. Please do not hesitate to give [place] a call back if you have any further questions. [place], BSN     Echocardiogram An echocardiogram, or echocardiography, uses sound waves (ultrasound) to produce an image of your heart. The echocardiogram is simple, painless, obtained within a short period of time, and offers valuable information to your health care provider. The images from an echocardiogram can provide information such as:  Evidence of coronary artery disease (CAD).  Heart size.  Heart muscle function.  Heart valve function.  Aneurysm detection.  Evidence of a past heart attack.  Fluid buildup around the heart.  Heart muscle thickening.  Assess heart valve function. Tell a health care provider about:  Any allergies you have.  All medicines you are taking, including vitamins, herbs, eye drops, creams, and over-the-counter medicines.  Any problems you or family members have had with anesthetic  medicines.  Any blood disorders you have.  Any surgeries you have had.  Any medical conditions you have.  Whether you are pregnant or may be pregnant. What happens before the procedure? No special preparation is needed. Eat and drink normally. What happens during the procedure?  In order to produce an image of your heart, gel  will be applied to your chest and a wand-like tool (transducer) will be moved over your chest. The gel will help transmit the sound waves from the transducer. The sound waves will harmlessly bounce off your heart to allow the heart images to be captured in real-time motion. These images will then be recorded.  You may need an IV to receive a medicine that improves the quality of the pictures. What happens after the procedure? You may return to your normal schedule including diet, activities, and medicines, unless your health care provider tells you otherwise. This information is not intended to replace advice given to you by your health care provider. Make sure you discuss any questions you have with your health care provider. Document Released: 01/14/2000 Document Revised: 09/04/2015 Document Reviewed: 09/23/2012 Elsevier Interactive Patient Education  2017 [place]. Pharmacologic Stress Electrocardiogram A pharmacologic stress electrocardiogram is a heart (cardiac) test that uses nuclear imaging to evaluate the blood supply to your heart. This test may also be called a pharmacologic stress electrocardiography. Pharmacologic means that a medicine is used to increase your heart rate and blood pressure.  This stress test is done to find areas of poor blood flow to the heart by determining the extent of coronary artery disease (CAD). Some people exercise on a treadmill, which naturally increases the blood flow to the heart. For those people unable to exercise on a treadmill, a medicine is used. This medicine stimulates your heart and will cause your heart to beat harder and more quickly, as if you were exercising.  Pharmacologic stress tests can help determine:  The adequacy of blood flow to your heart during increased levels of activity in order to clear you for discharge home.  The extent of coronary artery blockage caused by CAD.  Your prognosis if you have suffered a heart attack.  The  effectiveness of cardiac procedures done, such as an angioplasty, which can increase the circulation in your coronary arteries.  Causes of chest pain or pressure. LET [facility] CARE PROVIDER KNOW ABOUT:  Any allergies you have.  All medicines you are taking, including vitamins, herbs, eye drops, creams, and over-the-counter medicines.  Previous problems you or members of your family have had with the use of anesthetics.  Any blood disorders you have.  Previous surgeries you have had.  Medical conditions you have.  Possibility of pregnancy, if this applies.  If you are currently breastfeeding. RISKS AND COMPLICATIONS Generally, this is a safe procedure. However, as with any procedure, complications can occur. Possible complications include:  You develop pain or pressure in the following areas:  Chest.  Jaw or neck.  Between your shoulder blades.  Radiating down your left arm.  Headache.  Dizziness or light-headedness.  Shortness of breath.  Increased or irregular heartbeat.  Low blood pressure.  Nausea or vomiting.  Flushing.  Redness going up the arm and slight pain during injection of medicine.  Heart attack (rare). BEFORE THE PROCEDURE   Avoid all forms of caffeine for 24 hours before your test or as directed by your health care provider. This includes coffee, tea (even decaffeinated tea), caffeinated sodas, chocolate, cocoa, and certain  pain medicines.  Follow your health care provider's instructions regarding eating and drinking before the test.  Take your medicines as directed at regular times with water unless instructed otherwise. Exceptions may include:  If you have diabetes, ask how you are to take your insulin or pills. It is common to adjust insulin dosing the morning of the test.  If you are taking beta-blocker medicines, it is important to talk to your health care provider about these medicines well before the date of your test. Taking  beta-blocker medicines may interfere with the test. In some cases, these medicines need to be changed or stopped 24 hours or more before the test.  If you wear a nitroglycerin patch, it may need to be removed prior to the test. Ask your health care provider if the patch should be removed before the test.  If you use an inhaler for any breathing condition, bring it with you to the test.  If you are an outpatient, bring a snack so you can eat right after the stress phase of the test.  Do not smoke for 4 hours prior to the test or as directed by your health care provider.  Do not apply lotions, powders, creams, or oils on your chest prior to the test.  Wear comfortable shoes and clothing. Let your health care provider know if you were unable to complete or follow the preparations for your test. PROCEDURE   Multiple patches (electrodes) will be put on your chest. If needed, small areas of your chest may be shaved to get better contact with the electrodes. Once the electrodes are attached to your body, multiple wires will be attached to the electrodes, and your heart rate will be monitored.  An IV access will be started. A nuclear trace (isotope) is given. The isotope may be given intravenously, or it may be swallowed. Nuclear refers to several types of radioactive isotopes, and the nuclear isotope lights up the arteries so that the nuclear images are clear. The isotope is absorbed by your body. This results in low radiation exposure.  A resting nuclear image is taken to show how your heart functions at rest.  A medicine is given through the IV access.  A second scan is done about 1 hour after the medicine injection and determines how your heart functions under stress.  During this stress phase, you will be connected to an electrocardiogram machine. Your blood pressure and oxygen levels will be monitored. AFTER THE PROCEDURE   Your heart rate and blood pressure will be monitored after the  test.  You may return to your normal schedule, including diet,activities, and medicines, unless your health care provider tells you otherwise. This information is not intended to replace advice given to you by your health care provider. Make sure you discuss any questions you have with your health care provider. Document Released: 06/04/2008 Document Revised: 01/21/2013 Document Reviewed: 09/23/2012 Elsevier Interactive Patient Education  2017 [employer].

## 2016-03-22 NOTE — Progress Notes (Signed)
Cardiology Office Note   Date:  03/22/2016   ID:  Matthew Luna, DOB 11-16-1940, MRN [id number]  Referring Doctor:  Odette Fraction, MD  Dr. Lucky Cowboy  Cardiologist:   Wende Bushy, MD   Reason for consultation:  Chief Complaint  Patient presents with  . other    New Patient. AV&VS surgical clearence. Pt c/o stomach pain on right side and dizziness.Reviewed meds with pt verbally.      History of Present Illness: Matthew Luna is a 76 y.o. male who presents for Preoperative cardiac evaluation prior to AAA repair  Patient is being seen by Dr. Lucky Cowboy and was recommended to proceed with AAA repair due to increase in size, 5.1 cm now.  Patient denies chest pain and shortness of breath. He does not routinely exercise, may go on the Bowflex sometimes. No significant symptoms.  No PND, orthopnea, edema. No palpitations. Some lightheadedness with change in position but not bothersome to him. No syncope.   ROS:  Please see the history of present illness. Aside from mentioned under HPI, all other systems are reviewed and negative.     Past Medical History:  Diagnosis Date  . AAA (abdominal aortic aneurysm) ([place])   . Actinic keratosis   . Diverticulosis of colon (without mention of hemorrhage)   . Duodenitis without mention of hemorrhage   . Esophageal reflux   . Hypertrophy of prostate without urinary obstruction and other lower urinary tract symptoms (LUTS)   . Left sided ulcerative (chronic) colitis ([place])   . Mononeuritis of unspecified site   . Nonspecific abnormal results of thyroid function study   . Prostate cancer ([place])    cryotherapy 2011 [facility])  . Sleep apnea    ahi 75, cpap 14  . Stricture and stenosis of esophagus   . Unspecified essential hypertension     Past Surgical History:  Procedure Laterality Date  . BALLOON DILATION  03/22/2011   Procedure: BALLOON DILATION;  Surgeon: Inda Castle, MD;  Location: [facility];  Service: Endoscopy;  Laterality: N/A;   kelly/ebp  . CHOLECYSTECTOMY    . ESOPHAGOGASTRODUODENOSCOPY  03/22/2011   Procedure: ESOPHAGOGASTRODUODENOSCOPY (EGD);  Surgeon: Inda Castle, MD;  Location: [facility];  Service: Endoscopy;  Laterality: N/A;  . PROSTATE SURGERY       reports that he has never smoked. He has never used smokeless tobacco. He reports that he drinks about 1.2 oz of alcohol per week . He reports that he does not use drugs.   family history includes Cancer in his mother; Snoring in his father.   Outpatient Medications Prior to Visit  Medication Sig Dispense Refill  . citalopram (CELEXA) 40 MG tablet TAKE 1 TABLET BY MOUTH EVERY DAY (Patient taking differently: Take 20mg s once daily) 30 tablet 8  . clobetasol (TEMOVATE) 0.05 % external solution Apply 1 application topically 2 (two) times daily. (Patient taking differently: Apply 1 application topically daily as needed (dry skin). ) 50 mL 5  . fluticasone (FLONASE) 50 MCG/ACT nasal spray Place 1 spray into both nostrils daily as needed for allergies.   11  . OVER THE COUNTER MEDICATION Apply 1 Dose topically 2 (two) times daily. nervestra - otc nerve pain med    . oxybutynin (DITROPAN) 5 MG tablet Take 5 mg by mouth 3 (three) times daily.    . Vitamins-Lipotropics (LIPOGEN SG) CAPS Take 1 capsule by mouth daily.    Marland Kitchen BYSTOLIC 10 MG tablet TAKE 1 TABLET BY MOUTH EVERY DAY (Patient  not taking: Reported on 03/22/2016) 90 tablet 4  . finasteride (PROSCAR) 5 MG tablet TAKE 1 TABLET BY MOUTH EVERY DAY (Patient not taking: Reported on 03/22/2016) 90 tablet 1  . Misc. Throat Products (BREATHE RIGHT SNORE RELIEF MT) Use as directed 1 strip in the mouth or throat at bedtime.     No facility-administered medications prior to visit.      Allergies: Metronidazole; Amoxicillin-pot clavulanate; Ciprofloxacin; and Mesalamine    PHYSICAL EXAM: VS:  BP 100/74 (BP Location: Left Arm, Patient Position: Sitting, Cuff Size: Normal)   Pulse 60   Ht 6\' 3"  (1.905 m)   Wt 232 lb  (105.2 kg)   BMI 29.00 kg/m  , Body mass index is 29 kg/m. Wt Readings from Last 3 Encounters:  03/22/16 232 lb (105.2 kg)  03/17/16 232 lb (105.2 kg)  02/22/16 232 lb (105.2 kg)    GENERAL:  well developed, well nourished, not in acute distress HEENT: normocephalic, pink conjunctivae, anicteric sclerae, no xanthelasma, normal dentition, oropharynx clear NECK:  no neck vein engorgement, JVP normal, no hepatojugular reflux, carotid upstroke brisk and symmetric, no bruit, no thyromegaly, no lymphadenopathy LUNGS:  good respiratory effort, clear to auscultation bilaterally CV:  PMI not displaced, no thrills, no lifts, S1 and S2 within normal limits, no palpable S3 or S4, no murmurs, no rubs, no gallops ABD:  Soft, nontender, nondistended, normoactive bowel sounds, no abdominal aortic bruit, no hepatomegaly, no splenomegaly MS: nontender back, no kyphosis, no scoliosis, no joint deformities EXT:  2+ DP/PT pulses, no edema, no varicosities, no cyanosis, no clubbing SKIN: warm, nondiaphoretic, normal turgor, no ulcers NEUROPSYCH: alert, oriented to person, place, and time, sensory/motor grossly intact, normal mood, appropriate affect  Recent Labs: 06/01/2015: ALT 36; BUN 22; Potassium 4.4; Sodium 140; TSH 9.94 03/16/2016: Creatinine, Ser 1.60   Lipid Panel    Component Value Date/Time   CHOL 151 11/19/2014 0924   TRIG 91 11/19/2014 0924   HDL 36 (L) 11/19/2014 0924   CHOLHDL 4.2 11/19/2014 0924   VLDL 18 11/19/2014 0924   LDLCALC 97 11/19/2014 0924     Other studies Reviewed:  EKG:  The ekg from 03/22/2016 was personally reviewed by me and it revealed sinus rhythm, left axis deviation, abnormal EKG  Additional studies/ records that were reviewed personally reviewed by me today include: None available   ASSESSMENT AND PLAN: Preoperative cardiac evaluation prior to AAA repair Limited functional capacity, recommend further evaluation with pharmacologic nuclear stress test and  echocardiogram. If no ischemia, patient will be intermediate cardiac risk.  Hypertension Blood pressure is borderline, diastolic dose was recently reduced. Continue to monitor. Patient not significantly symptomatic. Continue beta blocker especially in light of AAA.  AAA Pt going for repair c/o Dr. Lucky Cowboy Recommended LDL goal is less than 10.   Current medicines are reviewed at length with the patient today.  The patient does not have concerns regarding medicines.  Labs/ tests ordered today include:  Orders Placed This Encounter  Procedures  . NM Myocar Multi W/Spect W/Wall Motion / EF  . EKG 12-Lead  . ECHOCARDIOGRAM COMPLETE     Disposition:   FU with undersigned after tests ptn  Thank you for this consultation. We will forwarding this consultation to referring physician.   I spent at least 45 minutes with the patient today and more than 50% of the time was spent counseling the patient and coordinating care.    Signed, Wende Bushy, MD  03/22/2016 11:45 AM    [facility]  This note was generated in part with voice recognition software and I apologize for any typographical errors that were not detected and corrected.

## 2016-03-23 ENCOUNTER — Encounter
Admission: RE | Admit: 2016-03-23 | Discharge: 2016-03-23 | Disposition: A | Discharge status: Home / Self Care | Payer: PPO | Source: Ambulatory Visit | Attending: Vascular Surgery | Admitting: Vascular Surgery

## 2016-03-23 DIAGNOSIS — K2980 Duodenitis without bleeding: Secondary | ICD-10-CM | POA: Diagnosis not present

## 2016-03-23 DIAGNOSIS — I672 Cerebral atherosclerosis: Secondary | ICD-10-CM | POA: Insufficient documentation

## 2016-03-23 DIAGNOSIS — R1013 Epigastric pain: Secondary | ICD-10-CM | POA: Diagnosis not present

## 2016-03-23 DIAGNOSIS — K222 Esophageal obstruction: Secondary | ICD-10-CM | POA: Insufficient documentation

## 2016-03-23 DIAGNOSIS — I10 Essential (primary) hypertension: Secondary | ICD-10-CM | POA: Insufficient documentation

## 2016-03-23 DIAGNOSIS — Z01812 Encounter for preprocedural laboratory examination: Secondary | ICD-10-CM | POA: Insufficient documentation

## 2016-03-23 DIAGNOSIS — C61 Malignant neoplasm of prostate: Secondary | ICD-10-CM | POA: Diagnosis not present

## 2016-03-23 DIAGNOSIS — N183 Chronic kidney disease, stage 3 (moderate): Secondary | ICD-10-CM | POA: Diagnosis not present

## 2016-03-23 DIAGNOSIS — G4733 Obstructive sleep apnea (adult) (pediatric): Secondary | ICD-10-CM | POA: Diagnosis not present

## 2016-03-23 DIAGNOSIS — I714 Abdominal aortic aneurysm, without rupture: Secondary | ICD-10-CM | POA: Diagnosis not present

## 2016-03-23 DIAGNOSIS — K5150 Left sided colitis without complications: Secondary | ICD-10-CM | POA: Insufficient documentation

## 2016-03-23 DIAGNOSIS — N400 Enlarged prostate without lower urinary tract symptoms: Secondary | ICD-10-CM | POA: Insufficient documentation

## 2016-03-23 HISTORY — DX: Polyneuropathy, unspecified: G62.9

## 2016-03-23 LAB — CBC
HCT: 42.6 % (ref 40.0–52.0)
Hemoglobin: 14.1 g/dL (ref 13.0–18.0)
MCH: 33 pg (ref 26.0–34.0)
MCHC: 33.2 g/dL (ref 32.0–36.0)
MCV: 99.5 fL (ref 80.0–100.0)
Platelets: 220 10*3/uL (ref 150–440)
RBC: 4.28 MIL/uL — ABNORMAL LOW (ref 4.40–5.90)
RDW: 14.2 % (ref 11.5–14.5)
WBC: 6.7 10*3/uL (ref 3.8–10.6)

## 2016-03-23 LAB — BASIC METABOLIC PANEL
Anion gap: 6 (ref 5–15)
BUN: 18 mg/dL (ref 6–20)
CO2: 27 mmol/L (ref 22–32)
Calcium: 8.7 mg/dL — ABNORMAL LOW (ref 8.9–10.3)
Chloride: 107 mmol/L (ref 101–111)
Creatinine, Ser: 1.59 mg/dL — ABNORMAL HIGH (ref 0.61–1.24)
GFR calc Af Amer: 48 mL/min — ABNORMAL LOW (ref >60)
GFR calc non Af Amer: 41 mL/min — ABNORMAL LOW (ref >60)
Glucose, Bld: 100 mg/dL — ABNORMAL HIGH (ref 65–99)
Potassium: 3.8 mmol/L (ref 3.5–5.1)
Sodium: 140 mmol/L (ref 135–145)

## 2016-03-23 LAB — APTT: aPTT: 27 seconds (ref 24–36)

## 2016-03-23 LAB — SURGICAL PCR SCREEN
MRSA, PCR: NEGATIVE
Staphylococcus aureus: NEGATIVE

## 2016-03-23 LAB — PROTIME-INR
INR: 0.99
Prothrombin Time: 13.1 seconds (ref 11.4–15.2)

## 2016-03-23 NOTE — Patient Instructions (Addendum)
  Your procedure is scheduled on: 03/29/16 Wed report at 9:00 am Report to [facility] ([place] at registration desk in [place].) To find out your arrival time please call [phone] if have any questions. Remember: Instructions that are not followed completely may result in serious medical risk, up to and including death, or upon the discretion of your surgeon and anesthesiologist your surgery may need to be rescheduled.    _x___ 1. Do not eat food or drink liquids after midnight. No gum chewing or hard candies.     __x__ 2. No Alcohol for 24 hours before or after surgery.   __x__3. No Smoking for 24 prior to surgery.   ____  4. Bring all medications with you on the day of surgery if instructed.    __x__ 5. Notify your doctor if there is any change in your medical condition     (cold, fever, infections).     Do not wear jewelry, make-up, hairpins, clips or nail polish.  Do not wear lotions, powders, or perfumes. You may wear deodorant.  Do not shave 48 hours prior to surgery. Men may shave face and neck.  Do not bring valuables to the hospital.    [facility] is not responsible for any belongings or valuables.               Contacts, dentures or bridgework may not be worn into surgery.  Leave your suitcase in the car. After surgery it may be brought to your room.  For patients admitted to the hospital, discharge time is determined by your treatment team.   Patients discharged the day of surgery will not be allowed to drive home.  You will need someone to drive you home and stay with you the night of your procedure.    Please read over the following fact sheets that you were given:   [facility] Preparing for Surgery and or MRSA Information   _x___ Take these medicines the morning of surgery with A SIP OF WATER:    1. citalopram (CELEXA)  2.  3.  4.  5.  6.  ____Fleets enema or Magnesium Citrate as directed.   _x___ Use CHG Soap  or sage wipes as directed on instruction sheet   ____ Use inhalers on the day of surgery and bring to hospital day of surgery  ____ Stop metformin 2 days prior to surgery    ____ Take 1/2 of usual insulin dose the night before surgery and none on the morning of           surgery.   ____ Stop Aspirin, Coumadin, Pllavix ,Eliquis, Effient, or Pradaxa  x__ Stop Anti-inflammatories such as Advil, Aleve, Ibuprofen, Motrin, Naproxen,          Naprosyn, Goodies powders or aspirin products. Ok to take Tylenol.   ____ Stop supplements until after surgery.    ____ Bring C-Pap to the hospital.

## 2016-03-23 NOTE — Pre-Procedure Instructions (Signed)
Cardiac clearance by Dr Wende Bushy, requested by Dr Bunnie Domino office.

## 2016-03-28 ENCOUNTER — Encounter (HOSPITAL_BASED_OUTPATIENT_CLINIC_OR_DEPARTMENT_OTHER)
Admission: RE | Admit: 2016-03-28 | Discharge: 2016-03-28 | Disposition: A | Discharge status: Home / Self Care | Payer: PPO | Source: Ambulatory Visit | Attending: Cardiology | Admitting: Cardiology

## 2016-03-28 ENCOUNTER — Encounter: Payer: Self-pay | Admitting: Cardiology

## 2016-03-28 DIAGNOSIS — Z888 Allergy status to other drugs, medicaments and biological substances status: Secondary | ICD-10-CM | POA: Diagnosis not present

## 2016-03-28 DIAGNOSIS — Z880 Allergy status to penicillin: Secondary | ICD-10-CM | POA: Diagnosis not present

## 2016-03-28 DIAGNOSIS — N280 Ischemia and infarction of kidney: Secondary | ICD-10-CM | POA: Diagnosis not present

## 2016-03-28 DIAGNOSIS — Z8546 Personal history of malignant neoplasm of prostate: Secondary | ICD-10-CM | POA: Diagnosis not present

## 2016-03-28 DIAGNOSIS — I714 Abdominal aortic aneurysm, without rupture: Secondary | ICD-10-CM | POA: Diagnosis not present

## 2016-03-28 DIAGNOSIS — N183 Chronic kidney disease, stage 3 (moderate): Secondary | ICD-10-CM | POA: Diagnosis not present

## 2016-03-28 DIAGNOSIS — F05 Delirium due to known physiological condition: Secondary | ICD-10-CM | POA: Diagnosis not present

## 2016-03-28 DIAGNOSIS — G4730 Sleep apnea, unspecified: Secondary | ICD-10-CM | POA: Diagnosis not present

## 2016-03-28 DIAGNOSIS — Z01810 Encounter for preprocedural cardiovascular examination: Secondary | ICD-10-CM | POA: Insufficient documentation

## 2016-03-28 DIAGNOSIS — Z87892 Personal history of anaphylaxis: Secondary | ICD-10-CM | POA: Diagnosis not present

## 2016-03-28 DIAGNOSIS — K219 Gastro-esophageal reflux disease without esophagitis: Secondary | ICD-10-CM | POA: Diagnosis not present

## 2016-03-28 DIAGNOSIS — I129 Hypertensive chronic kidney disease with stage 1 through stage 4 chronic kidney disease, or unspecified chronic kidney disease: Secondary | ICD-10-CM | POA: Diagnosis not present

## 2016-03-28 DIAGNOSIS — Z881 Allergy status to other antibiotic agents status: Secondary | ICD-10-CM | POA: Diagnosis not present

## 2016-03-28 LAB — NM MYOCAR MULTI W/SPECT W/WALL MOTION / EF
LV dias vol: 79 mL (ref 62–150)
LV sys vol: 30 mL
Peak HR: 73 {beats}/min
Percent HR: 50 %
Rest HR: 50 {beats}/min
TID: 1.14

## 2016-03-28 MED ORDER — TECHNETIUM TC 99M TETROFOSMIN IV KIT
12.6530 | PACK | Freq: Once | INTRAVENOUS | Status: AC | PRN
  Administered 2016-03-28: 12.653 via INTRAVENOUS

## 2016-03-28 MED ORDER — REGADENOSON 0.4 MG/5ML IV SOLN
0.4000 mg | Freq: Once | INTRAVENOUS | Status: AC
  Administered 2016-03-28: 0.4 mg via INTRAVENOUS

## 2016-03-28 MED ORDER — TECHNETIUM TC 99M TETROFOSMIN IV KIT
33.6060 | PACK | Freq: Once | INTRAVENOUS | Status: AC | PRN
  Administered 2016-03-28: 33.606 via INTRAVENOUS

## 2016-03-28 NOTE — Pre-Procedure Instructions (Signed)
SEE DR INGAL'S NOTE FROM 03/23/16 ON CHART. INTERMEDIATE RISK IF STRESS NEGATIVE. NEGATIVE STRESS 03/28/16 UNDER RESULTS REVIEW

## 2016-03-29 ENCOUNTER — Encounter
Admission: RE | Disposition: A | Discharge status: Home / Self Care | Payer: Self-pay | Source: Ambulatory Visit | Attending: Vascular Surgery

## 2016-03-29 ENCOUNTER — Inpatient Hospital Stay
Admission: RE | Admit: 2016-03-29 | Discharge: 2016-03-30 | DRG: 269 | Disposition: A | Discharge status: Home / Self Care | Payer: PPO | Source: Ambulatory Visit | Attending: Vascular Surgery | Admitting: Vascular Surgery

## 2016-03-29 ENCOUNTER — Inpatient Hospital Stay: Payer: PPO | Admitting: Registered Nurse

## 2016-03-29 ENCOUNTER — Inpatient Hospital Stay: Payer: PPO

## 2016-03-29 DIAGNOSIS — G4730 Sleep apnea, unspecified: Secondary | ICD-10-CM | POA: Diagnosis not present

## 2016-03-29 DIAGNOSIS — N280 Ischemia and infarction of kidney: Secondary | ICD-10-CM | POA: Diagnosis not present

## 2016-03-29 DIAGNOSIS — F05 Delirium due to known physiological condition: Secondary | ICD-10-CM | POA: Diagnosis not present

## 2016-03-29 DIAGNOSIS — I129 Hypertensive chronic kidney disease with stage 1 through stage 4 chronic kidney disease, or unspecified chronic kidney disease: Secondary | ICD-10-CM | POA: Diagnosis not present

## 2016-03-29 DIAGNOSIS — I10 Essential (primary) hypertension: Secondary | ICD-10-CM | POA: Diagnosis not present

## 2016-03-29 DIAGNOSIS — Z880 Allergy status to penicillin: Secondary | ICD-10-CM

## 2016-03-29 DIAGNOSIS — Z881 Allergy status to other antibiotic agents status: Secondary | ICD-10-CM | POA: Diagnosis not present

## 2016-03-29 DIAGNOSIS — Z87892 Personal history of anaphylaxis: Secondary | ICD-10-CM

## 2016-03-29 DIAGNOSIS — I714 Abdominal aortic aneurysm, without rupture, unspecified: Secondary | ICD-10-CM | POA: Diagnosis present

## 2016-03-29 DIAGNOSIS — N183 Chronic kidney disease, stage 3 (moderate): Secondary | ICD-10-CM | POA: Diagnosis not present

## 2016-03-29 DIAGNOSIS — K219 Gastro-esophageal reflux disease without esophagitis: Secondary | ICD-10-CM | POA: Diagnosis not present

## 2016-03-29 DIAGNOSIS — Z888 Allergy status to other drugs, medicaments and biological substances status: Secondary | ICD-10-CM

## 2016-03-29 DIAGNOSIS — I739 Peripheral vascular disease, unspecified: Secondary | ICD-10-CM | POA: Diagnosis not present

## 2016-03-29 DIAGNOSIS — Z8546 Personal history of malignant neoplasm of prostate: Secondary | ICD-10-CM | POA: Diagnosis not present

## 2016-03-29 HISTORY — PX: ENDOVASCULAR REPAIR/STENT GRAFT: CATH118280

## 2016-03-29 LAB — ABO/RH: ABO/RH(D): O POS

## 2016-03-29 LAB — GLUCOSE, CAPILLARY: Glucose-Capillary: 115 mg/dL — ABNORMAL HIGH (ref 65–99)

## 2016-03-29 LAB — MRSA PCR SCREENING: MRSA by PCR: NEGATIVE

## 2016-03-29 SURGERY — ENDOVASCULAR REPAIR/STENT GRAFT
Anesthesia: General

## 2016-03-29 MED ORDER — NITROGLYCERIN IN D5W 200-5 MCG/ML-% IV SOLN: 5.0000 ug/min | INTRAVENOUS | Status: DC

## 2016-03-29 MED ORDER — GLYCOPYRROLATE 0.2 MG/ML IJ SOLN
INTRAMUSCULAR | Status: AC
  Filled 2016-03-29: qty 1

## 2016-03-29 MED ORDER — SODIUM CHLORIDE 0.9 % IV SOLN: 500.0000 mL | Freq: Once | INTRAVENOUS | Status: DC | PRN

## 2016-03-29 MED ORDER — FLUTICASONE PROPIONATE 50 MCG/ACT NA SUSP
1.0000 | Freq: Every day | NASAL | Status: DC | PRN
  Filled 2016-03-29: qty 16

## 2016-03-29 MED ORDER — HYDRALAZINE HCL 20 MG/ML IJ SOLN: 5.0000 mg | INTRAMUSCULAR | Status: DC | PRN

## 2016-03-29 MED ORDER — ORAL CARE MOUTH RINSE: 15.0000 mL | Freq: Two times a day (BID) | OROMUCOSAL | Status: DC

## 2016-03-29 MED ORDER — FAMOTIDINE IN NACL 20-0.9 MG/50ML-% IV SOLN
20.0000 mg | Freq: Two times a day (BID) | INTRAVENOUS | Status: DC
  Administered 2016-03-29 – 2016-03-30 (×3): 20 mg via INTRAVENOUS
  Filled 2016-03-29 (×3): qty 50

## 2016-03-29 MED ORDER — ROCURONIUM BROMIDE 50 MG/5ML IV SOLN
INTRAVENOUS | Status: AC
  Filled 2016-03-29: qty 1

## 2016-03-29 MED ORDER — GUAIFENESIN-DM 100-10 MG/5ML PO SYRP: 15.0000 mL | ORAL_SOLUTION | ORAL | Status: DC | PRN

## 2016-03-29 MED ORDER — ONDANSETRON HCL 4 MG/2ML IJ SOLN: 4.0000 mg | Freq: Four times a day (QID) | INTRAMUSCULAR | Status: DC | PRN

## 2016-03-29 MED ORDER — MIDAZOLAM HCL 2 MG/2ML IJ SOLN
INTRAMUSCULAR | Status: AC
  Filled 2016-03-29: qty 2

## 2016-03-29 MED ORDER — FENTANYL CITRATE (PF) 100 MCG/2ML IJ SOLN
INTRAMUSCULAR | Status: AC
  Filled 2016-03-29: qty 2

## 2016-03-29 MED ORDER — PHENYLEPHRINE HCL 10 MG/ML IJ SOLN
INTRAMUSCULAR | Status: DC | PRN
  Administered 2016-03-29: 50 ug via INTRAVENOUS

## 2016-03-29 MED ORDER — LABETALOL HCL 5 MG/ML IV SOLN: 10.0000 mg | INTRAVENOUS | Status: DC | PRN

## 2016-03-29 MED ORDER — ACETAMINOPHEN 650 MG RE SUPP: 325.0000 mg | RECTAL | Status: DC | PRN

## 2016-03-29 MED ORDER — LIDOCAINE HCL (PF) 2 % IJ SOLN
INTRAMUSCULAR | Status: AC
  Filled 2016-03-29: qty 2

## 2016-03-29 MED ORDER — MIDAZOLAM HCL 2 MG/2ML IJ SOLN
INTRAMUSCULAR | Status: DC | PRN
  Administered 2016-03-29: 1 mg via INTRAVENOUS

## 2016-03-29 MED ORDER — DOCUSATE SODIUM 100 MG PO CAPS
100.0000 mg | ORAL_CAPSULE | Freq: Every day | ORAL | Status: DC
  Administered 2016-03-30: 100 mg via ORAL
  Filled 2016-03-29: qty 1

## 2016-03-29 MED ORDER — ONDANSETRON HCL 4 MG/2ML IJ SOLN
INTRAMUSCULAR | Status: DC | PRN
  Administered 2016-03-29: 4 mg via INTRAVENOUS

## 2016-03-29 MED ORDER — HEPARIN SODIUM (PORCINE) 1000 UNIT/ML IJ SOLN
INTRAMUSCULAR | Status: DC | PRN
  Administered 2016-03-29: 5 mL via INTRAVENOUS

## 2016-03-29 MED ORDER — CLINDAMYCIN PHOSPHATE 300 MG/50ML IV SOLN
INTRAVENOUS | Status: AC
  Filled 2016-03-29: qty 50

## 2016-03-29 MED ORDER — POTASSIUM CHLORIDE CRYS ER 20 MEQ PO TBCR
20.0000 meq | EXTENDED_RELEASE_TABLET | Freq: Every day | ORAL | Status: DC | PRN
Start: 2016-03-29 — End: 2016-03-30

## 2016-03-29 MED ORDER — CITALOPRAM HYDROBROMIDE 20 MG PO TABS
40.0000 mg | ORAL_TABLET | Freq: Every day | ORAL | Status: DC
  Administered 2016-03-30: 40 mg via ORAL
  Filled 2016-03-29 (×2): qty 2

## 2016-03-29 MED ORDER — MAGNESIUM SULFATE 2 GM/50ML IV SOLN: 2.0000 g | Freq: Every day | INTRAVENOUS | Status: DC | PRN

## 2016-03-29 MED ORDER — NEBIVOLOL HCL 5 MG PO TABS
5.0000 mg | ORAL_TABLET | Freq: Every day | ORAL | Status: DC
  Administered 2016-03-29: 5 mg via ORAL
  Filled 2016-03-29: qty 1

## 2016-03-29 MED ORDER — ONDANSETRON HCL 4 MG/2ML IJ SOLN: 4.0000 mg | Freq: Once | INTRAMUSCULAR | Status: DC | PRN

## 2016-03-29 MED ORDER — ROCURONIUM BROMIDE 100 MG/10ML IV SOLN
INTRAVENOUS | Status: DC | PRN
  Administered 2016-03-29: 10 mg via INTRAVENOUS
  Administered 2016-03-29: 40 mg via INTRAVENOUS

## 2016-03-29 MED ORDER — OXYBUTYNIN CHLORIDE 5 MG PO TABS
5.0000 mg | ORAL_TABLET | Freq: Three times a day (TID) | ORAL | Status: DC
  Administered 2016-03-29 – 2016-03-30 (×3): 5 mg via ORAL
  Filled 2016-03-29 (×3): qty 1

## 2016-03-29 MED ORDER — CLINDAMYCIN PHOSPHATE 300 MG/50ML IV SOLN
300.0000 mg | Freq: Four times a day (QID) | INTRAVENOUS | Status: DC
  Administered 2016-03-29: 300 mg via INTRAVENOUS

## 2016-03-29 MED ORDER — FAMOTIDINE 20 MG PO TABS: 20.0000 mg | ORAL_TABLET | Freq: Once | ORAL | Status: AC

## 2016-03-29 MED ORDER — SODIUM CHLORIDE 0.9 % IV SOLN
INTRAVENOUS | Status: DC
  Administered 2016-03-29: 14:00:00 via INTRAVENOUS

## 2016-03-29 MED ORDER — FAMOTIDINE 20 MG PO TABS
ORAL_TABLET | ORAL | Status: AC
  Administered 2016-03-29: 20 mg
  Filled 2016-03-29: qty 1

## 2016-03-29 MED ORDER — FENTANYL CITRATE (PF) 100 MCG/2ML IJ SOLN
INTRAMUSCULAR | Status: AC
Start: 2016-03-29 — End: 2016-03-29
  Filled 2016-03-29: qty 2

## 2016-03-29 MED ORDER — CLOBETASOL PROPIONATE 0.05 % EX SOLN: 1.0000 "application " | Freq: Every day | CUTANEOUS | Status: DC | PRN

## 2016-03-29 MED ORDER — FENTANYL CITRATE (PF) 100 MCG/2ML IJ SOLN
INTRAMUSCULAR | Status: DC | PRN
  Administered 2016-03-29 (×4): 50 ug via INTRAVENOUS

## 2016-03-29 MED ORDER — SUGAMMADEX SODIUM 200 MG/2ML IV SOLN
INTRAVENOUS | Status: AC
  Filled 2016-03-29: qty 2

## 2016-03-29 MED ORDER — ACETAMINOPHEN 325 MG PO TABS: 325.0000 mg | ORAL_TABLET | ORAL | Status: DC | PRN

## 2016-03-29 MED ORDER — CLINDAMYCIN PHOSPHATE 300 MG/50ML IV SOLN
300.0000 mg | Freq: Four times a day (QID) | INTRAVENOUS | Status: AC
  Administered 2016-03-29 (×2): 300 mg via INTRAVENOUS
  Filled 2016-03-29 (×2): qty 50

## 2016-03-29 MED ORDER — DOPAMINE-DEXTROSE 3.2-5 MG/ML-% IV SOLN: 3.0000 ug/kg/min | INTRAVENOUS | Status: DC

## 2016-03-29 MED ORDER — HEPARIN SODIUM (PORCINE) 1000 UNIT/ML IJ SOLN
INTRAMUSCULAR | Status: AC
  Filled 2016-03-29: qty 1

## 2016-03-29 MED ORDER — ONDANSETRON HCL 4 MG/2ML IJ SOLN
INTRAMUSCULAR | Status: AC
  Filled 2016-03-29: qty 2

## 2016-03-29 MED ORDER — EPHEDRINE SULFATE 50 MG/ML IJ SOLN
INTRAMUSCULAR | Status: DC | PRN
  Administered 2016-03-29: 5 mg via INTRAVENOUS

## 2016-03-29 MED ORDER — GLYCOPYRROLATE 0.2 MG/ML IJ SOLN
INTRAMUSCULAR | Status: DC | PRN
  Administered 2016-03-29: 0.2 mg via INTRAVENOUS

## 2016-03-29 MED ORDER — IOPAMIDOL (ISOVUE-300) INJECTION 61%
INTRAVENOUS | Status: DC | PRN
  Administered 2016-03-29: 40 mL via INTRA_ARTERIAL

## 2016-03-29 MED ORDER — PROPOFOL 10 MG/ML IV BOLUS
INTRAVENOUS | Status: DC | PRN
  Administered 2016-03-29: 30 mg via INTRAVENOUS
  Administered 2016-03-29: 100 mg via INTRAVENOUS

## 2016-03-29 MED ORDER — PHENOL 1.4 % MT LIQD
1.0000 | OROMUCOSAL | Status: DC | PRN
  Filled 2016-03-29: qty 177

## 2016-03-29 MED ORDER — OXYCODONE-ACETAMINOPHEN 5-325 MG PO TABS: 1.0000 | ORAL_TABLET | ORAL | Status: DC | PRN

## 2016-03-29 MED ORDER — LIDOCAINE HCL (CARDIAC) 20 MG/ML IV SOLN
INTRAVENOUS | Status: DC | PRN
  Administered 2016-03-29: 80 mg via INTRAVENOUS

## 2016-03-29 MED ORDER — SUGAMMADEX SODIUM 200 MG/2ML IV SOLN
INTRAVENOUS | Status: DC | PRN
  Administered 2016-03-29: 200 mg via INTRAVENOUS

## 2016-03-29 MED ORDER — FENTANYL CITRATE (PF) 100 MCG/2ML IJ SOLN
25.0000 ug | INTRAMUSCULAR | Status: DC | PRN
  Administered 2016-03-29: 25 ug via INTRAVENOUS

## 2016-03-29 MED ORDER — LIPOGEN SG PO CAPS: 1.0000 | ORAL_CAPSULE | Freq: Every day | ORAL | Status: DC

## 2016-03-29 MED ORDER — SODIUM CHLORIDE 0.9 % IV SOLN
INTRAVENOUS | Status: DC | PRN
  Administered 2016-03-29: 20 ug/min via INTRAVENOUS

## 2016-03-29 MED ORDER — DEXAMETHASONE SODIUM PHOSPHATE 10 MG/ML IJ SOLN
INTRAMUSCULAR | Status: AC
  Filled 2016-03-29: qty 1

## 2016-03-29 MED ORDER — DEXAMETHASONE SODIUM PHOSPHATE 10 MG/ML IJ SOLN
INTRAMUSCULAR | Status: DC | PRN
  Administered 2016-03-29: 10 mg via INTRAVENOUS

## 2016-03-29 MED ORDER — METOPROLOL TARTRATE 5 MG/5ML IV SOLN: 2.0000 mg | INTRAVENOUS | Status: DC | PRN

## 2016-03-29 MED ORDER — ALUM & MAG HYDROXIDE-SIMETH 200-200-20 MG/5ML PO SUSP: 15.0000 mL | ORAL | Status: DC | PRN

## 2016-03-29 MED ORDER — SODIUM CHLORIDE 0.9 % IV SOLN
INTRAVENOUS | Status: DC
Start: 2016-03-29 — End: 2016-03-29
  Administered 2016-03-29: 09:00:00 via INTRAVENOUS

## 2016-03-29 MED ORDER — PROPOFOL 10 MG/ML IV BOLUS
INTRAVENOUS | Status: AC
  Filled 2016-03-29: qty 20

## 2016-03-29 SURGICAL SUPPLY — 46 items
BLADE SURG 15 STRL LF DISP TIS (BLADE) ×1 IMPLANT
BLADE SURG 15 STRL SS (BLADE) ×2
BLADE SURG SZ11 CARB STEEL (BLADE) ×3 IMPLANT
BRUSH SCRUB 4% CHG (MISCELLANEOUS) ×3 IMPLANT
CATH ACCU-VU SIZ PIG 5F 70CM (CATHETERS) ×3 IMPLANT
CATH BALLN CODA 9X100X32 (BALLOONS) ×3 IMPLANT
CATH BEACON 5.038 65CM KMP-01 (CATHETERS) ×3 IMPLANT
COVER DRAPE FLUORO 36X44 (DRAPES) ×3 IMPLANT
DERMABOND ADVANCED (GAUZE/BANDAGES/DRESSINGS) ×2
DERMABOND ADVANCED .7 DNX12 (GAUZE/BANDAGES/DRESSINGS) ×1 IMPLANT
DEVICE CLOSURE PERCLS PRGLD 6F (VASCULAR PRODUCTS) ×8 IMPLANT
DEVICE PRESTO INFLATION (MISCELLANEOUS) ×6 IMPLANT
DEVICE SAFEGUARD 24CM (GAUZE/BANDAGES/DRESSINGS) ×6 IMPLANT
DEVICE TORQUE (MISCELLANEOUS) ×3 IMPLANT
DRYSEAL FLEXSHEATH 12FR 33CM (SHEATH) ×2
DRYSEAL FLEXSHEATH 18FR 33CM (SHEATH) ×2
ELECT CAUTERY BLADE 6.4 (BLADE) ×3 IMPLANT
EXCLUDER TNK 28.5X14.5X18CM (Endovascular Graft) ×1 IMPLANT
EXCLUDER TRUNK 28.5X14.5X18CM (Endovascular Graft) ×3 IMPLANT
GLIDEWIRE STIFF .35X180X3 HYDR (WIRE) ×3 IMPLANT
GLOVE BIO SURGEON STRL SZ7 (GLOVE) ×9 IMPLANT
GLOVE BIO SURGEON STRL SZ8 (GLOVE) ×3 IMPLANT
GOWN STRL REUS W/ TWL LRG LVL3 (GOWN DISPOSABLE) ×2 IMPLANT
GOWN STRL REUS W/ TWL XL LVL3 (GOWN DISPOSABLE) ×2 IMPLANT
GOWN STRL REUS W/TWL LRG LVL3 (GOWN DISPOSABLE) ×4
GOWN STRL REUS W/TWL XL LVL3 (GOWN DISPOSABLE) ×4
LEG CONTRALATERAL 16X20X13.5 (Vascular Products) ×2 IMPLANT
NEEDLE ENTRY 21GA 7CM ECHOTIP (NEEDLE) ×3 IMPLANT
PACK BASIN MAJOR ARMC (MISCELLANEOUS) ×3 IMPLANT
PERCLOSE PROGLIDE 6F (VASCULAR PRODUCTS) ×24
SET INTRO CAPELLA COAXIAL (SET/KITS/TRAYS/PACK) ×3 IMPLANT
SHEATH 9FRX11 (SHEATH) ×6 IMPLANT
SHEATH BRITE TIP 6FRX11 (SHEATH) ×6 IMPLANT
SHEATH DRYSEAL FLEX 12FR 33CM (SHEATH) ×1 IMPLANT
SHEATH DRYSEAL FLEX 18FR 33CM (SHEATH) ×1 IMPLANT
SPONGE XRAY 4X4 16PLY STRL (MISCELLANEOUS) ×6 IMPLANT
STENT GRAFT CONTRALAT 20X13.5 (Vascular Products) ×1 IMPLANT
SUT MNCRL 4-0 (SUTURE) ×2
SUT MNCRL 4-0 27XMFL (SUTURE) ×1
SUT PROLENE 6 0 BV (SUTURE) ×3 IMPLANT
SUTURE MNCRL 4-0 27XMF (SUTURE) ×1 IMPLANT
SYR 20CC LL (SYRINGE) ×3 IMPLANT
TOWEL OR 17X26 4PK STRL BLUE (TOWEL DISPOSABLE) ×3 IMPLANT
TUBING CONTRAST HIGH PRESS 72 (TUBING) ×3 IMPLANT
WIRE AMPLATZ SSTIFF .035X260CM (WIRE) ×6 IMPLANT
WIRE J 3MM .035X145CM (WIRE) ×9 IMPLANT

## 2016-03-29 NOTE — Anesthesia Preprocedure Evaluation (Signed)
Anesthesia Evaluation  Patient identified by MRN, date of birth, ID band Patient awake    Reviewed: Allergy & Precautions, H&P , NPO status , Patient's Chart, lab work & pertinent test results, reviewed documented beta blocker date and time   History of Anesthesia Complications Negative for: history of anesthetic complications  Airway Mallampati: III  TM Distance: >3 FB Neck ROM: full    Dental  (+) Implants, Poor Dentition   Pulmonary neg shortness of breath, sleep apnea , neg COPD, neg recent URI,           Cardiovascular Exercise Tolerance: Good hypertension, (-) angina+ Peripheral Vascular Disease  (-) CAD, (-) Past MI, (-) Cardiac Stents and (-) CABG (-) dysrhythmias (-) Valvular Problems/Murmurs     Neuro/Psych neg Seizures TIA Neuromuscular disease negative psych ROS   GI/Hepatic Neg liver ROS, PUD, GERD  ,  Endo/Other  negative endocrine ROS  Renal/GU CRFRenal disease  negative genitourinary   Musculoskeletal   Abdominal   Peds  Hematology negative hematology ROS (+)   Anesthesia Other Findings Past Medical History: No date: AAA (abdominal aortic aneurysm) ([facility]) No date: Actinic keratosis No date: Diverticulosis of colon (without mention of he* No date: Duodenitis without mention of hemorrhage No date: Esophageal reflux No date: Hypertrophy of prostate without urinary obstru* No date: Left sided ulcerative (chronic) colitis ([facility]) No date: Mononeuritis of unspecified site No date: Neuropathy ([facility]) No date: Nonspecific abnormal results of thyroid functi* No date: Prostate cancer ([place])     Comment: cryotherapy 2011 ([facility]) No date: Sleep apnea     Comment: ahi 75, cpap 14 No date: Stricture and stenosis of esophagus No date: Unspecified essential hypertension   Reproductive/Obstetrics negative OB ROS                             Anesthesia Physical Anesthesia Plan  ASA:  III  Anesthesia Plan: General   Post-op Pain Management:    Induction:   Airway Management Planned:   Additional Equipment:   Intra-op Plan:   Post-operative Plan:   Informed Consent: I have reviewed the patients History and Physical, chart, labs and discussed the procedure including the risks, benefits and alternatives for the proposed anesthesia with the patient or authorized representative who has indicated his/her understanding and acceptance.   Dental Advisory Given  Plan Discussed with: Anesthesiologist, CRNA and Surgeon  Anesthesia Plan Comments:         Anesthesia Quick Evaluation

## 2016-03-29 NOTE — Anesthesia Post-op Follow-up Note (Cosign Needed)
Anesthesia QCDR form completed.        

## 2016-03-29 NOTE — Anesthesia Procedure Notes (Addendum)
Procedure Name: Intubation Date/Time: 03/29/2016 10:25 AM Performed by: Hedda Slade Pre-anesthesia Checklist: Patient identified, Patient being monitored, Timeout performed, Emergency Drugs available and Suction available Patient Re-evaluated:Patient Re-evaluated prior to inductionOxygen Delivery Method: Circle system utilized Preoxygenation: Pre-oxygenation with 100% oxygen Intubation Type: IV induction Ventilation: Mask ventilation without difficulty and Oral airway inserted - appropriate to patient size Laryngoscope Size: Mac and 4 Grade View: Grade II Tube type: Oral Tube size: 7.5 mm Number of attempts: 3 Airway Equipment and Method: Stylet Placement Confirmation: ETT inserted through vocal cords under direct vision,  positive ETCO2 and breath sounds checked- equal and bilateral Secured at: 22 cm Tube secured with: Tape Dental Injury: Teeth and Oropharynx as per pre-operative assessment  Comments: Anterior airway, grade II view on first two attempts but unable to advance ETT through cords

## 2016-03-29 NOTE — Transfer of Care (Signed)
Immediate Anesthesia Transfer of Care Note  Patient: Matthew Luna  Procedure(s) Performed: Procedure(s): Endovascular Repair/Stent Graft (N/A)  Patient Location: PACU  Anesthesia Type:General  Level of Consciousness: awake, alert  and oriented  Airway & Oxygen Therapy: Patient Spontanous Breathing and Patient connected to face mask oxygen  Post-op Assessment: Report given to RN and Post -op Vital signs reviewed and stable  Post vital signs: Reviewed and stable  Last Vitals:  Vitals:   03/29/16 0900 03/29/16 1250  BP: (!) 144/75 (!) 148/98  Pulse: (!) 54 78  Resp: 14 19  Temp: 36.4 C (!) 36 C    Last Pain:  Vitals:   03/29/16 0900  TempSrc: Oral         Complications: No apparent anesthesia complications

## 2016-03-29 NOTE — Op Note (Addendum)
    OPERATIVE NOTE   PROCEDURE: 1. US guidance for vascular access, bilateral femoral arteries 2. Catheter placement into aorta from bilateral femoral approaches 3. Placement of a 28 mm 18 cm length Gore Excluder Endoprosthesis main body left with a 20 mm by 14 cm right contralateral limb 4. ProGlide closure devices bilateral femoral arteries  PRE-OPERATIVE DIAGNOSIS: AAA  POST-OPERATIVE DIAGNOSIS: same  SURGEON: Leotis Pain, MD and Hortencia Pilar, MD - Co-surgeons  ASSISTANT: Dr. Jamesetta So, MD  ANESTHESIA: General  ESTIMATED BLOOD LOSS: 200 cc  FINDING(S): 1.  AAA  SPECIMEN(S):  none  INDICATIONS:   HOLMES PILOTTI is a 76 y.o. male who presents with 5 cm AAA. The anatomy was suitable for endovascular repair.  Risks and benefits of repair in an endovascular fashion were discussed and informed consent was obtained.  DESCRIPTION: After obtaining full informed written consent, the patient was brought back to the operating room and placed supine upon the operating table.  The patient received IV antibiotics prior to induction.  After obtaining adequate anesthesia, the patient was prepped and draped in the standard fashion for endovascular AAA repair.  We then began by gaining access to both femoral arteries with US guidance with me working on the left and Dr. Delana Meyer working on the right.  The femoral arteries were found to be patent and accessed without difficulty with a needle under ultrasound guidance without difficulty on each side and permanent images were recorded.  We then placed 2 proglide devices on each side in a pre-close fashion and placed 8 French sheaths. The patient was then given 5000 units of intravenous heparin. The Pigtail catheter was placed into the aorta from the left side. Using this image, we selected a 28 mm diameter, 18 cm length Main body device.  Over a stiff wire, an 16 French sheath was placed on the left. The main body was then placed through the 18 French  sheath on the left. A Kumpe catheter was placed up the right side and a magnified image at the renal arteries was performed. The main body was then deployed just below the left renal artery as the right renal artery was chronically occluded with an atrophic right kidney. The Kumpe catheter was used to cannulate the contralateral gate without difficulty and successful cannulation was confirmed by twirling the pigtail catheter in the main body. We then placed a stiff wire and a retrograde arteriogram was performed through the right femoral sheath. We upsized to the 12 [place] sheath for the contralateral limb on the right and a 20 mm diameter by 14 cm length right limb was selected and deployed. The main body deployment was then completed. Based off the angiographic findings, extension limbs were not necessary. All junction points and seals zones were treated with the compliant balloon. The pigtail catheter was then replaced and a completion angiogram was performed.  No Endoleak was detected on completion angiography. The renal artert was found to be widely patent. At this point we elected to terminate the procedure. We secured the pro glide devices for hemostasis on the femoral arteries. The skin incision was closed with a 4-0 Monocryl. Dermabond and pressure dressing were placed. The patient was taken to the recovery room in stable condition having tolerated the procedure well.  COMPLICATIONS: none  CONDITION: stable  Leotis Pain  03/29/2016, 12:15 PM   This note was created with Dragon Medical transcription system. Any errors in dictation are purely unintentional.

## 2016-03-29 NOTE — Op Note (Signed)
OPERATIVE NOTE   PROCEDURE: 1. US guidance for vascular access, bilateral femoral arteries 2. Catheter placement into aorta from bilateral femoral approaches 3. Placement of a 28 x 14 x 18 cc 3 Gore Excluder Endoprosthesis main body with a 20 x 14 contralateral limb 4. ProGlide closure devices bilateral femoral arteries  PRE-OPERATIVE DIAGNOSIS: AAA  POST-OPERATIVE DIAGNOSIS: same  SURGEON: Hortencia Pilar, MD and Leotis Pain, MD - Co-surgeons  ASSISTANT: Dolores Frame, M.D.  ANESTHESIA: general  ESTIMATED BLOOD LOSS: 50 cc  FINDING(S): 1.  AAA  SPECIMEN(S):  none  INDICATIONS:   Matthew Luna is a 76 y.o. y.o. male who presents with an abdominal aortic aneurysm that has now grown greater than 5 cm. CT angiography demonstrates he is a good candidate for endovascular repair. The risks and benefits have been reviewed all questions have been answered the patient agrees to proceed with endovascular repair of his abdominal aortic aneurysm to prevent lethal rupture.  DESCRIPTION: After obtaining full informed written consent, the patient was brought back to the operating room and placed supine upon the operating table.  The patient received IV antibiotics prior to induction.  After obtaining adequate anesthesia, the patient was prepped and draped in the standard fashion for endovascular AAA repair.  We then began by gaining access to both femoral arteries with US guidance with me working on the right and Dr. Lucky Cowboy working on the left.  The femoral arteries were found to be patent and accessed without difficulty with a needle under ultrasound guidance without difficulty on each side and permanent images were recorded.  We then placed 2 proglide devices on each side in a pre-close fashion and placed 8 French sheaths.  The patient was then given  5000 units of intravenous heparin.   The Pigtail catheter was placed into the aorta from the  left side. Using this image, we selected a 28 x 14 x  18 Gore Main body device.  Over a stiff wire, an 4 French sheath was placed. The main body was then placed through the 18 French sheath. A Kumpe catheter was placed up the right side and a magnified image at the renal arteries was performed. The main body was then deployed just below the lowest renal artery, which in this case was the left. The Kumpe catheter and Glidewire was used to cannulate the contralateral gate without difficulty and successful cannulation was confirmed by twirling the pigtail catheter in the main body. We then placed a stiff wire and a retrograde arteriogram was performed through the right femoral sheath. We upsized to the 12 [place] sheath for the contralateral limb and a 20 x 14 limb was selected and deployed. The main body deployment was then completed. Based off the angiographic findings, extension limbs were not necessary.  All junction points and seals zones were treated with the compliant balloon.   The pigtail catheter was then replaced and a completion angiogram was performed.   No Endoleak was detected on completion angiography. The left renal artery was found to be widely patent (prior to the case based on the CT angiogram the right renal artery is chronically occluded and the right kidney measures proximally 7 cm).  Right renal artery occlusion was also identified on our very first image    At this point we elected to terminate the procedure. We secured the pro glide devices for hemostasis on the femoral arteries. The skin incision was closed with a 4-0 Monocryl. Dermabond and pressure dressing were placed.  The patient was taken to the recovery room in stable condition having tolerated the procedure well.  COMPLICATIONS: none  CONDITION: stable  Katha Cabal  06/06/2014, 3:51 PM

## 2016-03-29 NOTE — H&P (Signed)
[facility] History & Physical Update  The patient was interviewed and re-examined.  The patient's previous History and Physical has been reviewed and is unchanged.  There is no change in the plan of care. We plan to proceed with the scheduled procedure.  Leotis Pain, MD  03/29/2016, 9:47 AM

## 2016-03-29 NOTE — Plan of Care (Signed)
Problem: Pain Managment: Goal: General experience of comfort will improve Outcome: Progressing No complaints of pain during shift. Patient did complain of leg cramps and requested vinegar packs from dietary which patient ingested and relieved cramps.  Problem: Physical Regulation: Goal: Postoperative complications will be avoided or minimized Outcome: Progressing Level 2 site with large hematoma still present on right side (Left side level 0) after procedure but did not increase past marks made by PACU RN. Pulses equal and palpable in feet. MD, Dr. Lucky Cowboy, came by twice in afternoon to check on patient and site.

## 2016-03-30 ENCOUNTER — Encounter: Payer: Self-pay | Admitting: Family Medicine

## 2016-03-30 LAB — CBC
HCT: 36.7 % — ABNORMAL LOW (ref 40.0–52.0)
Hemoglobin: 12.6 g/dL — ABNORMAL LOW (ref 13.0–18.0)
MCH: 33.7 pg (ref 26.0–34.0)
MCHC: 34.4 g/dL (ref 32.0–36.0)
MCV: 98 fL (ref 80.0–100.0)
Platelets: 187 10*3/uL (ref 150–440)
RBC: 3.74 MIL/uL — ABNORMAL LOW (ref 4.40–5.90)
RDW: 14.6 % — ABNORMAL HIGH (ref 11.5–14.5)
WBC: 18.5 10*3/uL — ABNORMAL HIGH (ref 3.8–10.6)

## 2016-03-30 LAB — BASIC METABOLIC PANEL
Anion gap: 7 (ref 5–15)
BUN: 29 mg/dL — ABNORMAL HIGH (ref 6–20)
CO2: 26 mmol/L (ref 22–32)
Calcium: 8.5 mg/dL — ABNORMAL LOW (ref 8.9–10.3)
Chloride: 105 mmol/L (ref 101–111)
Creatinine, Ser: 2.01 mg/dL — ABNORMAL HIGH (ref 0.61–1.24)
GFR calc Af Amer: 36 mL/min — ABNORMAL LOW (ref >60)
GFR calc non Af Amer: 31 mL/min — ABNORMAL LOW (ref >60)
Glucose, Bld: 120 mg/dL — ABNORMAL HIGH (ref 65–99)
Potassium: 5.1 mmol/L (ref 3.5–5.1)
Sodium: 138 mmol/L (ref 135–145)

## 2016-03-30 MED ORDER — THIAMINE HCL 100 MG/ML IJ SOLN: 100.0000 mg | Freq: Every day | INTRAMUSCULAR | Status: DC

## 2016-03-30 MED ORDER — HYDROCODONE-ACETAMINOPHEN 5-325 MG PO TABS: 1.0000 | ORAL_TABLET | Freq: Four times a day (QID) | ORAL | 0 refills | Status: DC | PRN

## 2016-03-30 MED ORDER — LORAZEPAM 2 MG PO TABS: 0.0000 mg | ORAL_TABLET | Freq: Four times a day (QID) | ORAL | Status: DC

## 2016-03-30 MED ORDER — FOLIC ACID 1 MG PO TABS
1.0000 mg | ORAL_TABLET | Freq: Every day | ORAL | Status: DC
  Administered 2016-03-30: 1 mg via ORAL
  Filled 2016-03-30: qty 1

## 2016-03-30 MED ORDER — VITAMIN B-1 100 MG PO TABS
100.0000 mg | ORAL_TABLET | Freq: Every day | ORAL | Status: DC
Start: 2016-03-30 — End: 2016-03-30
  Administered 2016-03-30: 100 mg via ORAL
  Filled 2016-03-30: qty 1

## 2016-03-30 MED ORDER — CLOPIDOGREL BISULFATE 75 MG PO TABS: 75.0000 mg | ORAL_TABLET | Freq: Every day | ORAL | 11 refills | Status: DC

## 2016-03-30 MED ORDER — LORAZEPAM 2 MG PO TABS: 0.0000 mg | ORAL_TABLET | Freq: Two times a day (BID) | ORAL | Status: DC

## 2016-03-30 MED ORDER — LORAZEPAM 2 MG/ML IJ SOLN: 1.0000 mg | Freq: Four times a day (QID) | INTRAMUSCULAR | Status: DC | PRN

## 2016-03-30 MED ORDER — HYDROCODONE-ACETAMINOPHEN 5-325 MG PO TABS: 1.0000 | ORAL_TABLET | Freq: Four times a day (QID) | ORAL | Status: DC | PRN

## 2016-03-30 MED ORDER — ADULT MULTIVITAMIN W/MINERALS CH
1.0000 | ORAL_TABLET | Freq: Every day | ORAL | Status: DC
Start: 2016-03-30 — End: 2016-03-30
  Administered 2016-03-30: 1 via ORAL
  Filled 2016-03-30: qty 1

## 2016-03-30 MED ORDER — LORAZEPAM 0.5 MG PO TABS: 1.0000 mg | ORAL_TABLET | Freq: Four times a day (QID) | ORAL | Status: DC | PRN

## 2016-03-30 NOTE — Progress Notes (Signed)
Pt left floor with wife and Psychologist, occupational. Has discharge paperwork. Knows when he took his last medications and when to take the evening medications. Received prescriptions and knows when his follow up appointments are.

## 2016-03-30 NOTE — Progress Notes (Signed)
Left pedal pulse has progressively become more and more faint throughout the shift.  Dr. Delana Meyer informed about the Left pedal pulse absent to palpation and doppler at this time. Left posterior tibialis pulse present with doppler. No change in color, temperature, or capillary refill of left foot and patient not complaining of any pain, numbness, or tingling in left foot..  Left groin PAD deflated to 0cc of air but kept in place per MD.   Also spoke to Dr. Delana Meyer regarding a Level 1 hematoma with some bruising at the Left groin site and improving status of Level 2 hematoma at Right groin site.  Right groin PAD deflated to 30cc for now and will continue to monitor closely.

## 2016-03-30 NOTE — Progress Notes (Signed)
Pt ready to be discharged. MD not concerned about B/C increasing, kidney function still good. Normal for WBC to spike after surgery. Did void after foley removed. Brusing to right and left groin and hip, not concerning per MD.No pain reported, only soreness. Ambulated around hall without incident. Unbalanced at times, but steady gait. Oxygen sats dropped when coughed but immediately back to 90s on RA. Had confusion this AM. Night nurse reported he had confusion last PM. Pt asked for beer with his medications. Charge nurse called MD to get ciwa-ar orders. Wife at bedside.

## 2016-03-30 NOTE — Progress Notes (Signed)
[facility]  Daily Progress Note   Subjective  - 1 Day Post-Op  A fair bit of confusion overnight and this morning.  Better now with wife at bedside to redirect him.   No bleeding overnight.    Objective Vitals:   03/30/16 0800 03/30/16 0819 03/30/16 0900 03/30/16 1000  BP: (!) 148/111 138/86 (!) 147/94 (!) 156/99  Pulse: 61 63 61 63  Resp: 19 19 (!) 21 17  Temp:  97.6 F (36.4 C)    TempSrc:  Oral    SpO2: 93% 95% 96% 97%  Weight:      Height:        Intake/Output Summary (Last 24 hours) at 03/30/16 1038 Last data filed at 03/30/16 0600  Gross per 24 hour  Intake           1422.5 ml  Output             1165 ml  Net            257.5 ml    PULM  CTAB CV  RRR VASC  Groins with moderate bruising.  No discrete hematoma or signs of continued bleeding.  Feet warm  Laboratory CBC    Component Value Date/Time   WBC 18.5 (H) 03/30/2016 0545   HGB 12.6 (L) 03/30/2016 0545   HCT 36.7 (L) 03/30/2016 0545   PLT 187 03/30/2016 0545    BMET    Component Value Date/Time   NA 138 03/30/2016 0545   K 5.1 03/30/2016 0545   CL 105 03/30/2016 0545   CO2 26 03/30/2016 0545   GLUCOSE 120 (H) 03/30/2016 0545   BUN 29 (H) 03/30/2016 0545   CREATININE 2.01 (H) 03/30/2016 0545   CREATININE 1.56 (H) 06/01/2015 0832   CALCIUM 8.5 (L) 03/30/2016 0545   GFRNONAA 31 (L) 03/30/2016 0545   GFRNONAA 43 (L) 06/01/2015 0832   GFRAA 36 (L) 03/30/2016 0545   GFRAA 50 (L) 06/01/2015 [id number]    Assessment/Planning: POD #1 s/p EVAR   Doing Ok  Will plan discharge later today.  May improve confusion to be at home.  Was likely sundowning  RTC 2-3 weeks for wound check    Leotis Pain  03/30/2016, 10:38 AM

## 2016-03-30 NOTE — Anesthesia Postprocedure Evaluation (Signed)
Anesthesia Post Note  Patient: Matthew Luna  Procedure(s) Performed: Procedure(s) (LRB): Endovascular Repair/Stent Graft (N/A)  Patient location during evaluation: ICU Anesthesia Type: General Level of consciousness: awake and alert Pain management: pain level controlled Vital Signs Assessment: post-procedure vital signs reviewed and stable Respiratory status: spontaneous breathing, nonlabored ventilation, respiratory function stable and patient connected to nasal cannula oxygen Cardiovascular status: blood pressure returned to baseline and stable Postop Assessment: no signs of nausea or vomiting Anesthetic complications: no     Last Vitals:  Vitals:   03/30/16 0600 03/30/16 0610  BP: (!) 140/110 (!) 139/98  Pulse: 76 62  Resp: 16 13  Temp:      Last Pain:  Vitals:   03/30/16 0400  TempSrc: Oral  PainSc: 0-No pain                 Alison Stalling

## 2016-03-30 NOTE — Progress Notes (Signed)
Patient with new onset of confusion this morning.  VSS.  Patient follow commands  Patient easily reoriented.  Wife at bedside. Wife verbalized patient has occasional glass of wine, but patients says he drinks daily.   Dr. Lucky Cowboy paged.  Lab work reviewed over the phone with Dr. Lucky Cowboy and new onset of confusion discussed. Orders obtained for CIWA protocol.

## 2016-03-30 NOTE — Discharge Summary (Signed)
[facility]    Discharge Summary    Patient ID:  Matthew Luna MRN: [id number] DOB/AGE: 1940-11-03 76 y.o.  Admit date: 03/29/2016 Discharge date: 03/30/2016 Date of Surgery: 03/29/2016 Surgeon: Surgeon(s): Algernon Huxley, MD Katha Cabal, MD  Admission Diagnosis: AAA (abdominal aortic aneurysm) without rupture [facility]) [I71.4]  Discharge Diagnoses:  AAA (abdominal aortic aneurysm) without rupture [facility]) [I71.4]  Secondary Diagnoses: Past Medical History:  Diagnosis Date  . AAA (abdominal aortic aneurysm) ([place])   . Actinic keratosis   . Diverticulosis of colon (without mention of hemorrhage)   . Duodenitis without mention of hemorrhage   . Esophageal reflux   . Hypertrophy of prostate without urinary obstruction and other lower urinary tract symptoms (LUTS)   . Left sided ulcerative (chronic) colitis ([place])   . Mononeuritis of unspecified site   . Neuropathy ([place])   . Nonspecific abnormal results of thyroid function study   . Prostate cancer ([place])    cryotherapy 2011 [facility])  . Sleep apnea    ahi 75, cpap 14  . Stricture and stenosis of esophagus   . Unspecified essential hypertension     Procedure(s): Endovascular Repair/Stent Graft  Discharged Condition: good  HPI:  Patient here for elective AAA repair.  Hospital Course:  JAQUELINE Luna is a 76 y.o. male is S/P Neither Procedure(s): Endovascular Repair/Stent Graft Extubated: POD # 0 Physical exam: moderate groin bruising.  Feet warm Post-op wounds clean, dry, intact or healing well Pt. Ambulating, voiding and taking PO diet without difficulty. Pt pain controlled with PO pain meds. Labs as below Complications:none  Consults:    Significant Diagnostic Studies: CBC Lab Results  Component Value Date   WBC 18.5 (H) 03/30/2016   HGB 12.6 (L) 03/30/2016   HCT 36.7 (L) 03/30/2016   MCV 98.0 03/30/2016   PLT 187 03/30/2016    BMET    Component Value Date/Time   NA 138  03/30/2016 0545   K 5.1 03/30/2016 0545   CL 105 03/30/2016 0545   CO2 26 03/30/2016 0545   GLUCOSE 120 (H) 03/30/2016 0545   BUN 29 (H) 03/30/2016 0545   CREATININE 2.01 (H) 03/30/2016 0545   CREATININE 1.56 (H) 06/01/2015 0832   CALCIUM 8.5 (L) 03/30/2016 0545   GFRNONAA 31 (L) 03/30/2016 0545   GFRNONAA 43 (L) 06/01/2015 0832   GFRAA 36 (L) 03/30/2016 0545   GFRAA 50 (L) 06/01/2015 0832   COAG Lab Results  Component Value Date   INR 0.99 03/23/2016   INR 0.97 05/10/2014     Disposition:  Discharge to :Home Discharge Instructions    Call MD for:  redness, tenderness, or signs of infection (pain, swelling, bleeding, redness, odor or green/yellow discharge around incision site)    Complete by:  As directed    Call MD for:  severe or increased pain, loss or decreased feeling  in affected limb(s)    Complete by:  As directed    Call MD for:  temperature >100.5    Complete by:  As directed    Driving Restrictions    Complete by:  As directed    No driving for 48 hours   Lifting restrictions    Complete by:  As directed    No lifting for 5 days   No dressing needed    Complete by:  As directed    Replace only if drainage present   Resume previous diet    Complete by:  As directed  Allergies as of 03/30/2016      Reactions   Metronidazole Anaphylaxis   Amoxicillin-pot Clavulanate Other (See Comments)   UNKNOWN   Ciprofloxacin Other (See Comments)   UNKNOWN   Mesalamine    REACTION: Intolerance      Medication List    TAKE these medications   citalopram 40 MG tablet Commonly known as:  CELEXA TAKE 1 TABLET BY MOUTH EVERY DAY What changed:  See the new instructions.   clobetasol 0.05 % external solution Commonly known as:  TEMOVATE Apply 1 application topically 2 (two) times daily. What changed:  when to take this  reasons to take this   clopidogrel 75 MG tablet Commonly known as:  PLAVIX Take 1 tablet (75 mg total) by mouth daily.   fluticasone  50 MCG/ACT nasal spray Commonly known as:  FLONASE Place 1 spray into both nostrils daily as needed for allergies.   HYDROcodone-acetaminophen 5-325 MG tablet Commonly known as:  NORCO/VICODIN Take 1-2 tablets by mouth every 6 (six) hours as needed for moderate pain.   LIPOGEN SG Caps Take 1 capsule by mouth daily.   nebivolol 10 MG tablet Commonly known as:  BYSTOLIC Take 5 mg by mouth at bedtime.   OVER THE COUNTER MEDICATION Apply 1 Dose topically 2 (two) times daily. nervestra - otc nerve pain med   oxybutynin 5 MG tablet Commonly known as:  DITROPAN Take 5 mg by mouth 3 (three) times daily.      Verbal and written Discharge instructions given to the patient. Wound care per Discharge AVS Follow-up Information    KIMBERLY A STEGMAYER, PA-C Follow up in 3 week(s).   Specialty:  Physician Assistant Why:  with [facility] Contact information: [place] [address] [phone]           Signed: Leotis Pain, MD  03/30/2016, 10:43 AM

## 2016-04-02 LAB — TYPE AND SCREEN
ABO/RH(D): O POS
Antibody Screen: NEGATIVE
Unit division: 0
Unit division: 0

## 2016-04-02 LAB — BPAM RBC
Blood Product Expiration Date: 201803272359
Blood Product Expiration Date: 201803272359
Unit Type and Rh: 5100
Unit Type and Rh: 5100

## 2016-04-02 LAB — PREPARE RBC (CROSSMATCH)

## 2016-04-13 ENCOUNTER — Other Ambulatory Visit: Payer: Self-pay

## 2016-04-13 ENCOUNTER — Ambulatory Visit (INDEPENDENT_AMBULATORY_CARE_PROVIDER_SITE_OTHER): Payer: PPO

## 2016-04-13 DIAGNOSIS — Z01810 Encounter for preprocedural cardiovascular examination: Secondary | ICD-10-CM

## 2016-05-09 DIAGNOSIS — N200 Calculus of kidney: Secondary | ICD-10-CM | POA: Diagnosis not present

## 2016-05-09 DIAGNOSIS — N4889 Other specified disorders of penis: Secondary | ICD-10-CM | POA: Diagnosis not present

## 2016-05-09 DIAGNOSIS — R350 Frequency of micturition: Secondary | ICD-10-CM | POA: Diagnosis not present

## 2016-05-09 DIAGNOSIS — R339 Retention of urine, unspecified: Secondary | ICD-10-CM | POA: Diagnosis not present

## 2016-05-09 DIAGNOSIS — C61 Malignant neoplasm of prostate: Secondary | ICD-10-CM | POA: Diagnosis not present

## 2016-05-09 DIAGNOSIS — I878 Other specified disorders of veins: Secondary | ICD-10-CM | POA: Diagnosis not present

## 2016-05-17 DIAGNOSIS — N200 Calculus of kidney: Secondary | ICD-10-CM | POA: Diagnosis not present

## 2016-05-23 ENCOUNTER — Ambulatory Visit (INDEPENDENT_AMBULATORY_CARE_PROVIDER_SITE_OTHER): Payer: PPO | Admitting: Nurse Practitioner

## 2016-05-23 ENCOUNTER — Encounter: Payer: Self-pay | Admitting: Nurse Practitioner

## 2016-05-23 VITALS — BP 130/80 | HR 80 | Ht 75.0 in | Wt 225.2 lb

## 2016-05-23 DIAGNOSIS — Z1211 Encounter for screening for malignant neoplasm of colon: Secondary | ICD-10-CM | POA: Diagnosis not present

## 2016-05-23 DIAGNOSIS — Z8719 Personal history of other diseases of the digestive system: Secondary | ICD-10-CM | POA: Diagnosis not present

## 2016-05-23 DIAGNOSIS — R1310 Dysphagia, unspecified: Secondary | ICD-10-CM | POA: Diagnosis not present

## 2016-05-23 MED ORDER — OMEPRAZOLE 20 MG PO CPDR: 20.0000 mg | DELAYED_RELEASE_CAPSULE | ORAL | 2 refills | Status: DC

## 2016-05-23 NOTE — Progress Notes (Addendum)
HPI:  Patient is a 76 year old male, formerly followed by Dr. Deatra Ina, not seen in several years. He has a history of esophageal strictures and a remote history of left sided colitis. Last EGD with balloon dilation of a moderate GEJ stricture was February 2013. Patient did fine after his last dilation. A few months ago he started having mild dysphagia to some solids, mainly meats. He gets heartburn about every 7-10 days depending on diet. He does not take acid suppressants. No odynophagia. His weight is stable. Patient was started on oxybutin 3 times a day a couple months ago. Patient has no other GI complaints.    Past Medical History:  Diagnosis Date  . AAA (abdominal aortic aneurysm) ([place])   . Actinic keratosis   . Diverticulosis of colon (without mention of hemorrhage)   . Duodenitis without mention of hemorrhage   . Esophageal reflux   . Hypertrophy of prostate without urinary obstruction and other lower urinary tract symptoms (LUTS)   . Left sided ulcerative (chronic) colitis ([place])   . Mononeuritis of unspecified site   . Neuropathy   . Nonspecific abnormal results of thyroid function study   . Prostate cancer ([place])    cryotherapy 2011 [facility])  . Sleep apnea    ahi 75, cpap 14  . Stricture and stenosis of esophagus   . Unspecified essential hypertension      Past Surgical History:  Procedure Laterality Date  . ABDOMINAL AORTIC ANEURYSM REPAIR     2018  . BALLOON DILATION  03/22/2011   Procedure: BALLOON DILATION;  Surgeon: Inda Castle, MD;  Location: [facility];  Service: Endoscopy;  Laterality: N/A;  kelly/ebp  . CHOLECYSTECTOMY    . ENDOVASCULAR REPAIR/STENT GRAFT N/A 03/29/2016   Procedure: Endovascular Repair/Stent Graft;  Surgeon: Algernon Huxley, MD;  Location: [place] [facility];  Service: Cardiovascular;  Laterality: N/A;  . ESOPHAGOGASTRODUODENOSCOPY  03/22/2011   Procedure: ESOPHAGOGASTRODUODENOSCOPY (EGD);  Surgeon: Inda Castle, MD;  Location: [facility];  Service: Endoscopy;  Laterality: N/A;  . PROSTATE SURGERY     Family History  Problem Relation Age of Onset  . Cancer Mother   . Snoring Father   . Cancer    . Anesthesia problems Neg Hx   . Hypotension Neg Hx   . Malignant hyperthermia Neg Hx   . Pseudochol deficiency Neg Hx    Social History  Substance Use Topics  . Smoking status: Never Smoker  . Smokeless tobacco: Never Used  . Alcohol use 4.2 oz/week    7 Glasses of wine per week   Current Outpatient Prescriptions  Medication Sig Dispense Refill  . citalopram (CELEXA) 40 MG tablet TAKE 1 TABLET BY MOUTH EVERY DAY (Patient taking differently: Take 20mg s once daily) 30 tablet 8  . clobetasol (TEMOVATE) 0.05 % external solution Apply 1 application topically 2 (two) times daily. (Patient taking differently: Apply 1 application topically daily as needed (dry skin). ) 50 mL 5  . fluticasone (FLONASE) 50 MCG/ACT nasal spray Place 1 spray into both nostrils daily as needed for allergies.   11  . HYDROcodone-acetaminophen (NORCO/VICODIN) 5-325 MG tablet Take 1-2 tablets by mouth every 6 (six) hours as needed for moderate pain. 30 tablet 0  . nebivolol (BYSTOLIC) 10 MG tablet Take 5 mg by mouth at bedtime.     Marland Kitchen OVER THE COUNTER MEDICATION Apply 1 Dose topically 2 (two) times daily. nervestra - otc nerve pain med    . oxybutynin (DITROPAN)  5 MG tablet Take 5 mg by mouth 3 (three) times daily.    . Vitamins-Lipotropics (LIPOGEN SG) CAPS Take 1 capsule by mouth daily.    Marland Kitchen omeprazole (PRILOSEC) 20 MG capsule Take 1 capsule (20 mg total) by mouth every morning. 30 minutes before breakfast 30 capsule 2   No current facility-administered medications for this visit.    Allergies  Allergen Reactions  . Metronidazole Anaphylaxis  . Amoxicillin-Pot Clavulanate Other (See Comments)    UNKNOWN  . Ciprofloxacin Other (See Comments)    UNKNOWN  . Mesalamine     REACTION: Intolerance     Review of Systems: All systems reviewed  and negative except where noted in HPI.    Physical Exam: BP 130/80   Pulse 80   Ht 6\' 3"  (1.905 m)   Wt 225 lb 3.2 oz (102.2 kg)   SpO2 98%   BMI 28.15 kg/m  Constitutional:  Well-developed, white male male in no acute distress. Psychiatric: Normal mood and affect. Behavior is normal. EENT: Conjunctivae are normal. No scleral icterus. Neck supple.  Cardiovascular: Normal rate, regular rhythm.  Pulmonary/chest: Effort normal and breath sounds normal. No wheezing, rales or rhonchi. Abdominal: Soft, nondistended, nontender. Bowel sounds active throughout. There are no masses palpable. Extremities: no edema Lymphadenopathy: No cervical adenopathy noted. Neurological: Alert and oriented to person place and time. Skin: Skin is warm and dry. No rashes noted.   ASSESSMENT AND PLAN:  38. 76 year old male with recurrent solid food dysphagia. Symptoms are mild, mainly has problems swallowing meat.  EGD with stricture dilation done 2009, 2010 and 2013. Did well until a few months ago. Rule out recurrent stricture but also consider dysmotility / esophagitis from untreated GERD symptoms.  -Advised to start Prilosec 20 mg daily 30 minutes before breakfast -obtain barium swallow with tablet -If barium swallow abnormal will proceed with upper endoscopy with dilation. Otherwise, will continue to treat for reflux and reevaluate in a few weeks.   2. Hx of left sided colitis, presumably UC and dating back to 2009 . Last sigmoidoscopy in 2011 with findings of left sided colitis, path c/w IBD. I don't see that he has had a colonoscopy in many years. He has obviously been in clinical remission off meds for years. I do think he should have a colonoscopy, at least for colon cancer screening. If he ends up needing an EGD then can try and get the two done at same time.    Tye Savoy, NP  05/23/2016, 10:01 AM  Addendum: Reviewed and agree with initial management. Agree with colonoscopy recommendation May  also need/warrant upper endoscopy with possible dilation but will await barium swallow with tablet Jerene Bears, MD

## 2016-05-23 NOTE — Patient Instructions (Addendum)
You have been scheduled for a Barium Esophogram at [facility] (1st floor of the hospital) on 05/30/16 at 1000 am. Please arrive 15 minutes prior to your appointment for registration. Make certain not to have anything to eat or drink 4 hours prior to your test. If you need to reschedule for any reason, please contact radiology at [phone] to do so. __________________________________________________________________ A barium swallow is an examination that concentrates on views of the esophagus. This tends to be a double contrast exam (barium and two liquids which, when combined, create a gas to distend the wall of the oesophagus) or single contrast (non-ionic iodine based). The study is usually tailored to your symptoms so a good history is essential. Attention is paid during the study to the form, structure and configuration of the esophagus, looking for functional disorders (such as aspiration, dysphagia, achalasia, motility and reflux) EXAMINATION You may be asked to change into a gown, depending on the type of swallow being performed. A radiologist and radiographer will perform the procedure. The radiologist will advise you of the type of contrast selected for your procedure and direct you during the exam. You will be asked to stand, sit or lie in several different positions and to hold a small amount of fluid in your mouth before being asked to swallow while the imaging is performed .In some instances you may be asked to swallow barium coated marshmallows to assess the motility of a solid food bolus. The exam can be recorded as a digital or video fluoroscopy procedure. POST PROCEDURE It will take 1-2 days for the barium to pass through your system. To facilitate this, it is important, unless otherwise directed, to increase your fluids for the next 24-48hrs and to resume your normal diet.  This test typically takes about 30 minutes to  perform. __________________________________________________________________________________+  Will call with results.  We have sent the following medications to your pharmacy for you to pick up at your convenience: Omeprazole 20 mg  You have been given GERD literature.  Thank you for choosing me and [facility].  Tye Savoy, NP

## 2016-05-30 ENCOUNTER — Ambulatory Visit (HOSPITAL_COMMUNITY)
Admission: RE | Admit: 2016-05-30 | Discharge: 2016-05-30 | Disposition: A | Discharge status: Home / Self Care | Payer: PPO | Source: Ambulatory Visit | Attending: Nurse Practitioner | Admitting: Nurse Practitioner

## 2016-05-30 DIAGNOSIS — K222 Esophageal obstruction: Secondary | ICD-10-CM | POA: Diagnosis not present

## 2016-05-30 DIAGNOSIS — K449 Diaphragmatic hernia without obstruction or gangrene: Secondary | ICD-10-CM | POA: Insufficient documentation

## 2016-05-30 DIAGNOSIS — R1310 Dysphagia, unspecified: Secondary | ICD-10-CM | POA: Diagnosis not present

## 2016-06-06 ENCOUNTER — Ambulatory Visit (AMBULATORY_SURGERY_CENTER): Payer: Self-pay | Admitting: *Deleted

## 2016-06-06 ENCOUNTER — Ambulatory Visit (HOSPITAL_COMMUNITY): Payer: PPO

## 2016-06-06 VITALS — Ht 75.0 in | Wt 226.0 lb

## 2016-06-06 DIAGNOSIS — R1310 Dysphagia, unspecified: Secondary | ICD-10-CM

## 2016-06-06 DIAGNOSIS — Z1211 Encounter for screening for malignant neoplasm of colon: Secondary | ICD-10-CM

## 2016-06-06 MED ORDER — NA SULFATE-K SULFATE-MG SULF 17.5-3.13-1.6 GM/177ML PO SOLN: 1.0000 | Freq: Once | ORAL | 0 refills | Status: AC

## 2016-06-06 NOTE — Progress Notes (Signed)
No egg or soy allergy known to patient  No issues with past sedation with any surgeries  or procedures, no intubation problems  No diet pills per patient No home 02 use per patient  No blood thinners per patient  Pt denies issues with constipation  No A fib or A flutter  EMMI video not sent as pt has no e mail   

## 2016-06-20 ENCOUNTER — Encounter (INDEPENDENT_AMBULATORY_CARE_PROVIDER_SITE_OTHER): Payer: Self-pay | Admitting: Vascular Surgery

## 2016-06-20 ENCOUNTER — Ambulatory Visit (INDEPENDENT_AMBULATORY_CARE_PROVIDER_SITE_OTHER): Payer: PPO

## 2016-06-20 ENCOUNTER — Ambulatory Visit (INDEPENDENT_AMBULATORY_CARE_PROVIDER_SITE_OTHER): Payer: PPO | Admitting: Vascular Surgery

## 2016-06-20 ENCOUNTER — Other Ambulatory Visit (INDEPENDENT_AMBULATORY_CARE_PROVIDER_SITE_OTHER): Payer: Self-pay | Admitting: Vascular Surgery

## 2016-06-20 VITALS — BP 183/95 | HR 46 | Resp 14 | Ht 75.0 in | Wt 226.0 lb

## 2016-06-20 DIAGNOSIS — I714 Abdominal aortic aneurysm, without rupture, unspecified: Secondary | ICD-10-CM

## 2016-06-20 DIAGNOSIS — I10 Essential (primary) hypertension: Secondary | ICD-10-CM

## 2016-06-20 DIAGNOSIS — N200 Calculus of kidney: Secondary | ICD-10-CM

## 2016-06-20 NOTE — Assessment & Plan Note (Signed)
His duplex today shows a patent stent graft without endoleak with a maximal diameter of the aorta of approximately 5.1 cm. He is doing well. His aneurysm has been successfully repaired. He has never strictured or limitations secondary to his aneurysm, and would not be at increased risk of any subsequent procedures for other issues. I will recheck him in 6 months with a duplex.

## 2016-06-20 NOTE — Assessment & Plan Note (Signed)
blood pressure control important in reducing the progression of atherosclerotic disease. On appropriate oral medications.  

## 2016-06-20 NOTE — Progress Notes (Signed)
Patient ID: Matthew Luna, male   DOB: 19-May-1940, 76 y.o.   MRN: [id number]  Chief Complaint  Patient presents with  . Re-evaluation    Ultrasound follow up    HPI Matthew Luna is a 76 y.o. male.  Patient returns for follow-up of his abdominal aortic aneurysm. He is about 3 months status post endovascular repair of this aneurysm. He is doing well with only some mild claudication symptoms currently. He denies any abdominal pain or signs of peripheral embolization. He still has known kidney stone issues and has some questions about that today, but he follows with Dr. Jacqlyn Larsen regularly for this. His duplex today shows a patent stent graft without endoleak with a maximal diameter of the aorta of approximately 5.1 cm.   Past Medical History:  Diagnosis Date  . AAA (abdominal aortic aneurysm) ([place])   . Actinic keratosis   . Allergy    mild   . Anxiety   . Diverticulosis of colon (without mention of hemorrhage)   . Duodenitis without mention of hemorrhage   . Esophageal reflux   . Hypertrophy of prostate without urinary obstruction and other lower urinary tract symptoms (LUTS)   . Left sided ulcerative (chronic) colitis ([place])   . Mononeuritis of unspecified site   . Neuropathy   . Nonspecific abnormal results of thyroid function study   . Prostate cancer ([place])    cryotherapy 2011 [facility])  . Sleep apnea    ahi 75, cpap 14  . Stricture and stenosis of esophagus   . TIA (transient ischemic attack) 2017   pt states was told mini stroke  . Unspecified essential hypertension     Past Surgical History:  Procedure Laterality Date  . ABDOMINAL AORTIC ANEURYSM REPAIR     2018  . BALLOON DILATION  03/22/2011   Procedure: BALLOON DILATION;  Surgeon: Inda Castle, MD;  Location: [facility];  Service: Endoscopy;  Laterality: N/A;  kelly/ebp  . CHOLECYSTECTOMY    . COLONOSCOPY    . ENDOVASCULAR REPAIR/STENT GRAFT N/A 03/29/2016   Procedure: Endovascular Repair/Stent Graft;  Surgeon:  Algernon Huxley, MD;  Location: [place] [facility];  Service: Cardiovascular;  Laterality: N/A;  . ESOPHAGOGASTRODUODENOSCOPY  03/22/2011   Procedure: ESOPHAGOGASTRODUODENOSCOPY (EGD);  Surgeon: Inda Castle, MD;  Location: [facility];  Service: Endoscopy;  Laterality: N/A;  . PROSTATE SURGERY    . UPPER GASTROINTESTINAL ENDOSCOPY        Allergies  Allergen Reactions  . Metronidazole Anaphylaxis  . Amoxicillin-Pot Clavulanate Other (See Comments)    UNKNOWN  . Ciprofloxacin Other (See Comments)    UNKNOWN  . Mesalamine     REACTION: Intolerance    Current Outpatient Prescriptions  Medication Sig Dispense Refill  . citalopram (CELEXA) 40 MG tablet TAKE 1 TABLET BY MOUTH EVERY DAY (Patient taking differently: Take 20mg s once daily) 30 tablet 8  . clobetasol (TEMOVATE) 0.05 % external solution Apply 1 application topically 2 (two) times daily. (Patient taking differently: Apply 1 application topically daily as needed (dry skin). ) 50 mL 5  . finasteride (PROSCAR) 5 MG tablet Take 5 mg by mouth.    . fluticasone (FLONASE) 50 MCG/ACT nasal spray Place 1 spray into both nostrils daily as needed for allergies.   11  . nebivolol (BYSTOLIC) 10 MG tablet Take 5 mg by mouth at bedtime.     Marland Kitchen OVER THE COUNTER MEDICATION Apply 1 Dose topically 2 (two) times daily. nervestra - otc nerve pain med    .  Vitamins-Lipotropics (LIPOGEN SG) CAPS Take 1 capsule by mouth daily.    Marland Kitchen HYDROcodone-acetaminophen (NORCO/VICODIN) 5-325 MG tablet Take 1-2 tablets by mouth every 6 (six) hours as needed for moderate pain. (Patient not taking: Reported on 06/20/2016) 30 tablet 0  . omeprazole (PRILOSEC) 20 MG capsule Take 1 capsule (20 mg total) by mouth every morning. 30 minutes before breakfast (Patient not taking: Reported on 06/20/2016) 30 capsule 2   No current facility-administered medications for this visit.         Physical Exam BP (!) 183/95 (BP Location: Right Arm)   Pulse (!) 46   Resp 14   Ht  6\' 3"  (1.905 m)   Wt 226 lb (102.5 kg)   BMI 28.25 kg/m  Gen:  WD/WN, NAD Skin: incision C/D/I     Assessment/Plan:  Essential hypertension blood pressure control important in reducing the progression of atherosclerotic disease. On appropriate oral medications.   Calculus of kidney Follows with urology for this  AAA (abdominal aortic aneurysm) without rupture ([place]) His duplex today shows a patent stent graft without endoleak with a maximal diameter of the aorta of approximately 5.1 cm. He is doing well. His aneurysm has been successfully repaired. He has never strictured or limitations secondary to his aneurysm, and would not be at increased risk of any subsequent procedures for other issues. I will recheck him in 6 months with a duplex.      Leotis Pain 06/20/2016, 9:44 AM   This note was created with Dragon medical transcription system.  Any errors from dictation are unintentional.

## 2016-06-20 NOTE — Assessment & Plan Note (Signed)
Follows with urology for this. 

## 2016-06-28 ENCOUNTER — Encounter: Payer: Self-pay | Admitting: Internal Medicine

## 2016-06-28 DIAGNOSIS — M546 Pain in thoracic spine: Secondary | ICD-10-CM | POA: Diagnosis not present

## 2016-06-28 DIAGNOSIS — M5413 Radiculopathy, cervicothoracic region: Secondary | ICD-10-CM | POA: Diagnosis not present

## 2016-06-28 DIAGNOSIS — M9901 Segmental and somatic dysfunction of cervical region: Secondary | ICD-10-CM | POA: Diagnosis not present

## 2016-06-28 DIAGNOSIS — M9902 Segmental and somatic dysfunction of thoracic region: Secondary | ICD-10-CM | POA: Diagnosis not present

## 2016-06-28 DIAGNOSIS — I10 Essential (primary) hypertension: Secondary | ICD-10-CM | POA: Diagnosis not present

## 2016-07-06 ENCOUNTER — Telehealth: Payer: Self-pay | Admitting: Internal Medicine

## 2016-07-06 ENCOUNTER — Telehealth: Payer: Self-pay | Admitting: Nurse Practitioner

## 2016-07-06 NOTE — Telephone Encounter (Signed)
See the office note and the DG esophagus study. Needs screening colon because of the IBD (discussed in his office visit). The EGD with dilation is based on the imaging study where a possible Schatzki's ring was found. This could be the cause of the swallow issue. This was also discussed with the patient. Information has been left on his voicemail per his request.

## 2016-07-10 NOTE — Telephone Encounter (Signed)
Patient calling back regarding this stating that he does not want to have the colonoscopy. Best # [phone]

## 2016-07-10 NOTE — Telephone Encounter (Signed)
Returned patient's call and he states that he does not want the colonoscopy.  He just wants EGD with Dil. I explained to him that we recommend he has the colonoscopy but he insist that he does not want it or need it.  I cancelled the colon, so he is scheduled for only EGD.

## 2016-07-11 ENCOUNTER — Encounter: Payer: Self-pay | Admitting: Internal Medicine

## 2016-07-11 ENCOUNTER — Ambulatory Visit (AMBULATORY_SURGERY_CENTER): Payer: PPO | Admitting: Internal Medicine

## 2016-07-11 VITALS — BP 157/86 | HR 48 | Temp 97.8°F | Resp 13 | Ht 75.0 in | Wt 226.0 lb

## 2016-07-11 DIAGNOSIS — R933 Abnormal findings on diagnostic imaging of other parts of digestive tract: Secondary | ICD-10-CM | POA: Diagnosis not present

## 2016-07-11 DIAGNOSIS — K2950 Unspecified chronic gastritis without bleeding: Secondary | ICD-10-CM

## 2016-07-11 DIAGNOSIS — K2980 Duodenitis without bleeding: Secondary | ICD-10-CM

## 2016-07-11 DIAGNOSIS — R1310 Dysphagia, unspecified: Secondary | ICD-10-CM | POA: Diagnosis not present

## 2016-07-11 DIAGNOSIS — K222 Esophageal obstruction: Secondary | ICD-10-CM

## 2016-07-11 MED ORDER — SODIUM CHLORIDE 0.9 % IV SOLN: 500.0000 mL | INTRAVENOUS | Status: DC

## 2016-07-11 NOTE — Progress Notes (Signed)
Spontaneous respirations throughout. VSS. Resting comfortably. To PACU on room air. Report to  Michelle RN. 

## 2016-07-11 NOTE — Patient Instructions (Addendum)
YOU HAD AN ENDOSCOPIC PROCEDURE TODAY AT [facility]:   Refer to the procedure report that was given to you for any specific questions about what was found during the examination.  If the procedure report does not answer your questions, please call your gastroenterologist to clarify.  If you requested that your care partner not be given the details of your procedure findings, then the procedure report has been included in a sealed envelope for you to review at your convenience later.  YOU SHOULD EXPECT: Some feelings of bloating in the abdomen. Passage of more gas than usual.  Walking can help get rid of the air that was put into your GI tract during the procedure and reduce the bloating. If you had a lower endoscopy (such as a colonoscopy or flexible sigmoidoscopy) you may notice spotting of blood in your stool or on the toilet paper. If you underwent a bowel prep for your procedure, you may not have a normal bowel movement for a few days.  Please Note:  You might notice some irritation and congestion in your nose or some drainage.  This is from the oxygen used during your procedure.  There is no need for concern and it should clear up in a day or so.  SYMPTOMS TO REPORT IMMEDIATELY:   Following upper endoscopy (EGD)  Vomiting of blood or coffee ground material  New chest pain or pain under the shoulder blades  Painful or persistently difficult swallowing  New shortness of breath  Fever of 100F or higher  Black, tarry-looking stools  For urgent or emergent issues, a gastroenterologist can be reached at any hour by calling [phone].   DIET:  We do recommend a small meal at first, but then you may proceed to your regular diet.  Drink plenty of fluids but you should avoid alcoholic beverages for 24 hours.  ACTIVITY:  You should plan to take it easy for the rest of today and you should NOT DRIVE or use heavy machinery until tomorrow (because of the sedation medicines used  during the test).    FOLLOW UP: Our staff will call the number listed on your records the next business day following your procedure to check on you and address any questions or concerns that you may have regarding the information given to you following your procedure. If we do not reach you, we will leave a message.  However, if you are feeling well and you are not experiencing any problems, there is no need to return our call.  We will assume that you have returned to your regular daily activities without incident.  If any biopsies were taken you will be contacted by phone or by letter within the next 1-3 weeks.  Please call us at [phone] if you have not heard about the biopsies in 3 weeks.   Await for biopsy results Hiatal hernia (handout given) Post Esophageal dilation diet (handout given)  Esophagitis and Stricture (handout given) Repeat upper Endoscopy for retreatment if pesisent or further dysphagia (EGD can be repeat in 3-4 weeks if Dysphagia persists   SIGNATURES/CONFIDENTIALITY: You and/or your care partner have signed paperwork which will be entered into your electronic medical record.  These signatures attest to the fact that that the information above on your After Visit Summary has been reviewed and is understood.  Full responsibility of the confidentiality of this discharge information lies with you and/or your care-partner.

## 2016-07-11 NOTE — Op Note (Signed)
[facility] Patient Name: Matthew Luna Procedure Date: 07/11/2016 3:25 PM MRN: [id number] Endoscopist: Jerene Bears , MD Age: 76 Referring MD:  Date of Birth: September 15, 1940 Gender: Male Account #: [id number] Procedure:                Upper GI endoscopy Indications:              Dysphagia, Abnormal cine-esophagram Medicines:                Monitored Anesthesia Care Procedure:                Pre-Anesthesia Assessment:                           - Prior to the procedure, a History and Physical                            was performed, and patient medications and                            allergies were reviewed. The patient's tolerance of                            previous anesthesia was also reviewed. The risks                            and benefits of the procedure and the sedation                            options and risks were discussed with the patient.                            All questions were answered, and informed consent                            was obtained. Prior Anticoagulants: The patient has                            taken no previous anticoagulant or antiplatelet                            agents. ASA Grade Assessment: III - A patient with                            severe systemic disease. After reviewing the risks                            and benefits, the patient was deemed in                            satisfactory condition to undergo the procedure.                           After obtaining informed consent, the endoscope was  passed under direct vision. Throughout the                            procedure, the patient's blood pressure, pulse, and                            oxygen saturations were monitored continuously. The                            Model GIF-HQ190 [id number]) scope was introduced                            through the mouth, and advanced to the second part                            of duodenum. The  upper GI endoscopy was                            accomplished without difficulty. The patient                            tolerated the procedure well. Scope In: Scope Out: Findings:                 A low-grade of narrowing Schatzki ring (acquired)                            was found at the gastroesophageal junction (42 cm                            from incisors). There was minimal heme with passage                            of the adult upper endoscope. A TTS dilator was                            passed through the scope. Dilation with an 12-12-11                            mm balloon dilator was performed to 13 mm (stopping                            after 11 and 12 mm). The dilation site was examined                            and showed moderate improvement in luminal                            narrowing with mucosal rent in 3 locations at the                            ring. Estimated blood loss was minimal.  A 4 cm hiatal hernia was present.                           The entire examined stomach was normal. Biopsies                            were taken with a cold forceps for histology and                            Helicobacter pylori testing.                           Localized moderate inflammation characterized by                            erythema was found in the duodenal bulb.                           The second portion of the duodenum was normal. Complications:            No immediate complications. Estimated Blood Loss:     Estimated blood loss was minimal. Impression:               - Low-grade of narrowing Schatzki ring. Dilated to                            13 mm                           - 4 cm hiatal hernia.                           - Normal stomach. Biopsied.                           - Duodenitis.                           - Normal second portion of the duodenum. Recommendation:           - Patient has a contact number available for                             emergencies. The signs and symptoms of potential                            delayed complications were discussed with the                            patient. Return to normal activities tomorrow.                            Written discharge instructions were provided to the                            patient.                           -  Soft diet today, then advance diet as tolerated.                           - Continue present medications.                           - Await pathology results.                           - Repeat upper endoscopy for retreatment if                            persistent or further dysphagia (EGD can be repeat                            in 3-4 weeks if dysphagia persists).                           - Colonoscopy for surveillance was again                            recommended, but patient currently not interested                            in proceeding with colonoscopy. Jerene Bears, MD 07/11/2016 3:52:45 PM This report has been signed electronically.

## 2016-07-11 NOTE — Progress Notes (Signed)
Called to room to assist during endoscopic procedure.  Patient ID and intended procedure confirmed with present staff. Received instructions for my participation in the procedure from the performing physician.  

## 2016-07-12 ENCOUNTER — Telehealth: Payer: Self-pay

## 2016-07-12 NOTE — Telephone Encounter (Signed)
  Follow up Call-  Call back number 07/11/2016  Post procedure Call Back phone  # [phone]  Permission to leave phone message Yes  Some recent data might be hidden     Patient questions:  Do you have a fever, pain , or abdominal swelling? No. Pain Score  0 *  Have you tolerated food without any problems? Yes.    Have you been able to return to your normal activities? Yes.    Do you have any questions about your discharge instructions: Diet   No. Medications  No. Follow up visit  No.  Do you have questions or concerns about your Care? No.  Actions: * If pain score is 4 or above: No action needed, pain <4.

## 2016-07-17 ENCOUNTER — Encounter: Payer: Self-pay | Admitting: Internal Medicine

## 2016-07-21 ENCOUNTER — Other Ambulatory Visit: Payer: Self-pay | Admitting: Family Medicine

## 2016-07-24 ENCOUNTER — Other Ambulatory Visit: Payer: Self-pay

## 2016-07-24 MED ORDER — OMEPRAZOLE 20 MG PO CPDR: 20.0000 mg | DELAYED_RELEASE_CAPSULE | ORAL | 1 refills | Status: DC

## 2016-07-24 NOTE — Telephone Encounter (Signed)
Pharmacy requesting a 90 day supply.  New prescription sent.

## 2016-07-25 ENCOUNTER — Other Ambulatory Visit: Payer: Self-pay | Admitting: Family Medicine

## 2016-07-25 MED ORDER — CITALOPRAM HYDROBROMIDE 40 MG PO TABS: 40.0000 mg | ORAL_TABLET | Freq: Every day | ORAL | 0 refills | Status: DC

## 2016-08-01 ENCOUNTER — Other Ambulatory Visit: Payer: Self-pay | Admitting: Family Medicine

## 2016-08-08 DIAGNOSIS — C61 Malignant neoplasm of prostate: Secondary | ICD-10-CM | POA: Diagnosis not present

## 2016-08-08 DIAGNOSIS — N200 Calculus of kidney: Secondary | ICD-10-CM | POA: Diagnosis not present

## 2016-08-08 DIAGNOSIS — R350 Frequency of micturition: Secondary | ICD-10-CM | POA: Diagnosis not present

## 2016-08-24 ENCOUNTER — Encounter: Payer: Self-pay | Admitting: Family Medicine

## 2016-08-24 ENCOUNTER — Ambulatory Visit (INDEPENDENT_AMBULATORY_CARE_PROVIDER_SITE_OTHER): Payer: PPO | Admitting: Family Medicine

## 2016-08-24 VITALS — BP 132/80 | HR 72 | Temp 98.2°F | Resp 16 | Ht 74.0 in | Wt 226.0 lb

## 2016-08-24 DIAGNOSIS — Z8673 Personal history of transient ischemic attack (TIA), and cerebral infarction without residual deficits: Secondary | ICD-10-CM

## 2016-08-24 DIAGNOSIS — H538 Other visual disturbances: Secondary | ICD-10-CM | POA: Diagnosis not present

## 2016-08-24 DIAGNOSIS — I714 Abdominal aortic aneurysm, without rupture, unspecified: Secondary | ICD-10-CM

## 2016-08-24 DIAGNOSIS — I10 Essential (primary) hypertension: Secondary | ICD-10-CM

## 2016-08-24 DIAGNOSIS — I672 Cerebral atherosclerosis: Secondary | ICD-10-CM

## 2016-08-24 NOTE — Progress Notes (Signed)
Subjective:    Patient ID: Matthew Luna, male    DOB: 02/27/1940, 76 y.o.   MRN: [id number]  HPI She has not been seen in a year. Since last time I saw the patient, he underwent endovascular repair of a AAA and has done well after surgery. He denies any chest pain shortness of breath or dyspnea on exertion. However the patient is no longer taking a statin medication. He is no longer taking aspirin. Past medical history is significant for a TIA. He has chronic dizziness which I attribute to possible vertebrobasilar insufficiency. MRA of the brain in 2016 revealed:  MRA HEAD: High-grade stenosis versus occlusion of proximal RIGHT P1 segment, mid to high-grade stenosis LEFT proximal P2 segment.  Atherosclerosis/stenosis of bilateral superior cerebellar arteries, possible occlusion on the LEFT.  Mild stenosis RIGHT supraclinoid internal carotid artery. Today I spent more than 20 minutes with the patient reviewing the findings of his previous MRA. I explained to him that he was taking an aspirin to prevent future strokes. He was also taking Lipitor in the past to prevent worsening of his atherosclerosis. He had forgot to take the aspirin. He had stopped Lipitor because he thought it contributed to his dizziness. His biggest concern today is dry mouth and dizziness. I again explained to him that some of the dizziness could be related to arterial insufficiency in the posterior circulation. However some of it could also be medication induced. He is on oxybutynin for overactive bladder. He thinks that Celexa is giving him dry mouth. I explained to him that the oxybutynin is most likely causing his dry mouth. He can also be contributing to his dizziness and grogginess. He also complains of blurry vision first thing in the morning. Throughout the day his vision improves. His blood pressure at home has been averaging 140-150/80-90. He is only taking one half tablet of Bystolic every day (5 mg). Past  Medical History:  Diagnosis Date  . AAA (abdominal aortic aneurysm) ([place])   . Actinic keratosis   . Allergy    mild   . Anxiety   . Diverticulosis of colon (without mention of hemorrhage)   . Duodenitis without mention of hemorrhage   . Esophageal reflux   . Hypertrophy of prostate without urinary obstruction and other lower urinary tract symptoms (LUTS)   . Left sided ulcerative (chronic) colitis ([place])   . Mononeuritis of unspecified site   . Neuropathy   . Nonspecific abnormal results of thyroid function study   . Prostate cancer ([place])    cryotherapy 2011 [facility])  . Sleep apnea    ahi 75, cpap 14  . Stricture and stenosis of esophagus   . TIA (transient ischemic attack) 2017   pt states was told mini stroke  . Unspecified essential hypertension    Past Surgical History:  Procedure Laterality Date  . ABDOMINAL AORTIC ANEURYSM REPAIR     2018  . BALLOON DILATION  03/22/2011   Procedure: BALLOON DILATION;  Surgeon: Matthew Castle, MD;  Location: [facility];  Service: Endoscopy;  Laterality: N/A;  kelly/ebp  . CHOLECYSTECTOMY    . COLONOSCOPY    . ENDOVASCULAR REPAIR/STENT GRAFT N/A 03/29/2016   Procedure: Endovascular Repair/Stent Graft;  Surgeon: Matthew Huxley, MD;  Location: [facility];  Service: Cardiovascular;  Laterality: N/A;  . ESOPHAGOGASTRODUODENOSCOPY  03/22/2011   Procedure: ESOPHAGOGASTRODUODENOSCOPY (EGD);  Surgeon: Matthew Castle, MD;  Location: [facility];  Service: Endoscopy;  Laterality: N/A;  . PROSTATE SURGERY    .  UPPER GASTROINTESTINAL ENDOSCOPY     Current Outpatient Prescriptions on File Prior to Visit  Medication Sig Dispense Refill  . citalopram (CELEXA) 40 MG tablet Take 1 tablet (40 mg total) by mouth daily. 90 tablet 0  . clobetasol (TEMOVATE) 0.05 % external solution Apply 1 application topically 2 (two) times daily. (Patient taking differently: Apply 1 application topically daily as needed (dry skin). ) 50 mL 5  . finasteride (PROSCAR)  5 MG tablet Take 5 mg by mouth.    . fluticasone (FLONASE) 50 MCG/ACT nasal spray Place 1 spray into both nostrils daily as needed for allergies.   11  . nebivolol (BYSTOLIC) 10 MG tablet Take 5 mg by mouth at bedtime.     Marland Kitchen omeprazole (PRILOSEC) 20 MG capsule Take 1 capsule (20 mg total) by mouth every morning. 30 minutes before breakfast 90 capsule 1  . OVER THE COUNTER MEDICATION Apply 1 Dose topically 2 (two) times daily. nervestra - otc nerve pain med    . Vitamins-Lipotropics (LIPOGEN SG) CAPS Take 1 capsule by mouth daily.     Current Facility-Administered Medications on File Prior to Visit  Medication Dose Route Frequency Provider Last Rate Last Dose  . 0.9 %  sodium chloride infusion  500 mL Intravenous Continuous Matthew Luna, Matthew Lines, MD       Allergies  Allergen Reactions  . Metronidazole Anaphylaxis  . Amoxicillin-Pot Clavulanate Other (See Comments)    UNKNOWN  . Ciprofloxacin Other (See Comments)    UNKNOWN  . Mesalamine     REACTION: Intolerance   Social History   Social History  . Marital status: Married    Spouse name: N/A  . Number of children: 1  . Years of education: Some colg   Occupational History  . Retired     Social History Main Topics  . Smoking status: Never Smoker  . Smokeless tobacco: Never Used  . Alcohol use 4.2 oz/week    7 Glasses of wine per week  . Drug use: No  . Sexual activity: No   Other Topics Concern  . Not on file   Social History Narrative   1 -2 cups of coffee a day, some tea consumption       Review of Systems  All other systems reviewed and are negative.      Objective:   Physical Exam  Constitutional: He is oriented to person, place, and time. He appears well-developed and well-nourished. No distress.  Neck: No JVD present. No thyromegaly present.  Cardiovascular: Normal rate, regular rhythm and normal heart sounds.  Exam reveals no gallop and no friction rub.   No murmur heard. Pulmonary/Chest: Effort normal and  breath sounds normal. No respiratory distress. He has no wheezes. He has no rales.  Abdominal: Soft. Bowel sounds are normal. He exhibits no distension and no mass. There is no tenderness. There is no rebound and no guarding.  Lymphadenopathy:    He has no cervical adenopathy.  Neurological: He is alert and oriented to person, place, and time. He has normal reflexes. No cranial nerve deficit. He exhibits normal muscle tone. Coordination normal.  Skin: He is not diaphoretic.  Vitals reviewed.         Assessment & Plan:  History of CVA (cerebrovascular accident) - Plan: COMPLETE METABOLIC PANEL WITH GFR, CBC with Differential/Platelet, Lipid panel  Benign essential HTN - Plan: COMPLETE METABOLIC PANEL WITH GFR, CBC with Differential/Platelet, Lipid panel  Intracranial atherosclerosis - Plan: COMPLETE METABOLIC PANEL WITH GFR, CBC with  Differential/Platelet, Lipid panel  AAA (abdominal aortic aneurysm) without rupture (HCC)  Emphasized to the patient that he must take an aspirin 81 mg a day for secondary prevention of stroke. I would also like him to increase bystolic to 10 mg a day to try to keep his blood pressure less than 140/90. I will check a fasting lipid panel. Goal LDL cholesterol is less than 70. I would recommend starting him back on a statin. He does not want to take Lipitor and therefore I will switch him to Crestor 20 mg a day based on the results of his lab work. Because of his dry mouth, and his dizziness, I will discontinue oxybutynin and replace with Myrbetriq 25 mg a day and recheck his symptoms in 2 weeks. I recommended that he decrease Celexa to 20 mg a day because of his age. I will also consult an ophthalmologist concerning his blurry vision

## 2016-08-25 ENCOUNTER — Encounter: Payer: Self-pay | Admitting: Family Medicine

## 2016-08-25 ENCOUNTER — Other Ambulatory Visit: Payer: Self-pay | Admitting: Family Medicine

## 2016-08-25 LAB — CBC WITH DIFFERENTIAL/PLATELET
Basophils Absolute: 73 cells/uL (ref 0–200)
Basophils Relative: 1 %
Eosinophils Absolute: 876 cells/uL — ABNORMAL HIGH (ref 15–500)
Eosinophils Relative: 12 %
HCT: 43.1 % (ref 38.5–50.0)
Hemoglobin: 14.1 g/dL (ref 13.0–17.0)
Lymphocytes Relative: 34 %
Lymphs Abs: 2482 cells/uL (ref 850–3900)
MCH: 32.4 pg (ref 27.0–33.0)
MCHC: 32.7 g/dL (ref 32.0–36.0)
MCV: 99.1 fL (ref 80.0–100.0)
MPV: 9.6 fL (ref 7.5–12.5)
Monocytes Absolute: 511 cells/uL (ref 200–950)
Monocytes Relative: 7 %
Neutro Abs: 3358 cells/uL (ref 1500–7800)
Neutrophils Relative %: 46 %
Platelets: 240 10*3/uL (ref 140–400)
RBC: 4.35 MIL/uL (ref 4.20–5.80)
RDW: 15.1 % — ABNORMAL HIGH (ref 11.0–15.0)
WBC: 7.3 10*3/uL (ref 3.8–10.8)

## 2016-08-25 LAB — COMPLETE METABOLIC PANEL WITH GFR
ALT: 7 U/L — ABNORMAL LOW (ref 9–46)
AST: 13 U/L (ref 10–35)
Albumin: 3.8 g/dL (ref 3.6–5.1)
Alkaline Phosphatase: 75 U/L (ref 40–115)
BUN: 21 mg/dL (ref 7–25)
CO2: 27 mmol/L (ref 20–31)
Calcium: 9.1 mg/dL (ref 8.6–10.3)
Chloride: 105 mmol/L (ref 98–110)
Creat: 1.93 mg/dL — ABNORMAL HIGH (ref 0.70–1.18)
GFR, Est African American: 39 mL/min — ABNORMAL LOW (ref >=60)
GFR, Est Non African American: 33 mL/min — ABNORMAL LOW (ref >=60)
Glucose, Bld: 64 mg/dL — ABNORMAL LOW (ref 70–99)
Potassium: 4.6 mmol/L (ref 3.5–5.3)
Sodium: 139 mmol/L (ref 135–146)
Total Bilirubin: 0.5 mg/dL (ref 0.2–1.2)
Total Protein: 6.4 g/dL (ref 6.1–8.1)

## 2016-08-25 LAB — LIPID PANEL
Cholesterol: 209 mg/dL — ABNORMAL HIGH (ref <200)
HDL: 35 mg/dL — ABNORMAL LOW (ref >40)
LDL Cholesterol: 139 mg/dL — ABNORMAL HIGH (ref <100)
Total CHOL/HDL Ratio: 6 Ratio — ABNORMAL HIGH (ref <5.0)
Triglycerides: 177 mg/dL — ABNORMAL HIGH (ref <150)
VLDL: 35 mg/dL — ABNORMAL HIGH (ref <30)

## 2016-08-25 MED ORDER — ASPIRIN EC 81 MG PO TBEC: 81.0000 mg | DELAYED_RELEASE_TABLET | Freq: Every day | ORAL | 11 refills | Status: DC

## 2016-08-25 MED ORDER — ROSUVASTATIN CALCIUM 20 MG PO TABS: 20.0000 mg | ORAL_TABLET | Freq: Every day | ORAL | 1 refills | Status: DC

## 2016-08-29 DIAGNOSIS — H2513 Age-related nuclear cataract, bilateral: Secondary | ICD-10-CM | POA: Diagnosis not present

## 2016-08-31 ENCOUNTER — Telehealth: Payer: Self-pay | Admitting: Family Medicine

## 2016-08-31 MED ORDER — MIRABEGRON ER 25 MG PO TB24: 25.0000 mg | ORAL_TABLET | Freq: Every day | ORAL | 11 refills | Status: DC

## 2016-08-31 NOTE — Telephone Encounter (Signed)
Pt needs [place] to call in script for myrbetriq sent to [facility].

## 2016-08-31 NOTE — Telephone Encounter (Signed)
Medication called/sent to requested pharmacy  

## 2016-09-29 DIAGNOSIS — M9902 Segmental and somatic dysfunction of thoracic region: Secondary | ICD-10-CM | POA: Diagnosis not present

## 2016-09-29 DIAGNOSIS — M546 Pain in thoracic spine: Secondary | ICD-10-CM | POA: Diagnosis not present

## 2016-09-29 DIAGNOSIS — M545 Low back pain: Secondary | ICD-10-CM | POA: Diagnosis not present

## 2016-09-29 DIAGNOSIS — M9901 Segmental and somatic dysfunction of cervical region: Secondary | ICD-10-CM | POA: Diagnosis not present

## 2016-09-29 DIAGNOSIS — M9903 Segmental and somatic dysfunction of lumbar region: Secondary | ICD-10-CM | POA: Diagnosis not present

## 2016-10-30 ENCOUNTER — Ambulatory Visit (INDEPENDENT_AMBULATORY_CARE_PROVIDER_SITE_OTHER): Payer: PPO | Admitting: Family Medicine

## 2016-10-30 ENCOUNTER — Encounter: Payer: Self-pay | Admitting: Family Medicine

## 2016-10-30 VITALS — BP 130/78 | HR 59 | Temp 97.5°F | Resp 16 | Ht 74.0 in | Wt 232.0 lb

## 2016-10-30 DIAGNOSIS — L821 Other seborrheic keratosis: Secondary | ICD-10-CM

## 2016-10-30 DIAGNOSIS — N401 Enlarged prostate with lower urinary tract symptoms: Secondary | ICD-10-CM

## 2016-10-30 DIAGNOSIS — R3912 Poor urinary stream: Secondary | ICD-10-CM

## 2016-10-30 MED ORDER — DUTASTERIDE-TAMSULOSIN HCL 0.5-0.4 MG PO CAPS: 1.0000 | ORAL_CAPSULE | Freq: Every day | ORAL | 5 refills | Status: DC

## 2016-10-30 NOTE — Progress Notes (Signed)
Subjective:    Patient ID: Matthew Luna, male    DOB: October 18, 1940, 76 y.o.   MRN: [id number]  HPI Patient has 2 seborrheic keratoses that he would like removed. There is a 4 mm papule on his right cheek below his right eyelid. He also has a 1 cm brown warty papule above his right clavicle. He is requesting that we treat both of these to remove them. He also has 3 erythematous lesions on his back. One is at the level of T7. One is at the level of T4 in the center of his back. One is off to the right over his right scapula. Each is approximately 6-8 mm in diameter. There are erythematous scaly macules and appear to be actinic keratoses. He also reports recalcitrant BPH. He is been tried on Flomax in the past. He has been tried on finasteride in the past. He is been tried on several medicines for overactive bladder. His urologist at [facility] including oxybutynin as well as myrbetriq.  As of right now, none of the medications have helped. His biggest symptom includes polyuria and nocturia. He has 5-6 episodes every night where he has to wake up and urinate. He never feels like he is emptying his bladder completely. He also reports multiple episodes during the day where he has to rush to the bathroom he reports a weak urinary stream. Past Medical History:  Diagnosis Date  . AAA (abdominal aortic aneurysm) ([place])   . Actinic keratosis   . Allergy    mild   . Anxiety   . CKD (chronic kidney disease) stage 3, GFR 30-59 ml/min ([facility])   . Diverticulosis of colon (without mention of hemorrhage)   . Duodenitis without mention of hemorrhage   . Esophageal reflux   . Hypertrophy of prostate without urinary obstruction and other lower urinary tract symptoms (LUTS)   . Left sided ulcerative (chronic) colitis ([place])   . Mononeuritis of unspecified site   . Neuropathy   . Nonspecific abnormal results of thyroid function study   . Prostate cancer ([place])    cryotherapy 2011 [facility])  . Sleep apnea    ahi 75, cpap 14  .  Stricture and stenosis of esophagus   . TIA (transient ischemic attack) 2017   pt states was told mini stroke  . Unspecified essential hypertension    Past Surgical History:  Procedure Laterality Date  . ABDOMINAL AORTIC ANEURYSM REPAIR     2018  . BALLOON DILATION  03/22/2011   Procedure: BALLOON DILATION;  Surgeon: Inda Castle, MD;  Location: [facility];  Service: Endoscopy;  Laterality: N/A;  kelly/ebp  . CHOLECYSTECTOMY    . COLONOSCOPY    . ENDOVASCULAR REPAIR/STENT GRAFT N/A 03/29/2016   Procedure: Endovascular Repair/Stent Graft;  Surgeon: Algernon Huxley, MD;  Location: [facility];  Service: Cardiovascular;  Laterality: N/A;  . ESOPHAGOGASTRODUODENOSCOPY  03/22/2011   Procedure: ESOPHAGOGASTRODUODENOSCOPY (EGD);  Surgeon: Inda Castle, MD;  Location: [facility];  Service: Endoscopy;  Laterality: N/A;  . PROSTATE SURGERY    . UPPER GASTROINTESTINAL ENDOSCOPY     Current Outpatient Prescriptions on File Prior to Visit  Medication Sig Dispense Refill  . aspirin EC 81 MG tablet Take 1 tablet (81 mg total) by mouth daily. 30 tablet 11  . citalopram (CELEXA) 40 MG tablet Take 1 tablet (40 mg total) by mouth daily. 90 tablet 0  . clobetasol (TEMOVATE) 0.05 % external solution Apply 1 application topically 2 (two) times daily. (  Patient taking differently: Apply 1 application topically daily as needed (dry skin). ) 50 mL 5  . fluticasone (FLONASE) 50 MCG/ACT nasal spray Place 1 spray into both nostrils daily as needed for allergies.   11  . nebivolol (BYSTOLIC) 10 MG tablet Take 5 mg by mouth at bedtime.     Marland Kitchen omeprazole (PRILOSEC) 20 MG capsule Take 1 capsule (20 mg total) by mouth every morning. 30 minutes before breakfast 90 capsule 1  . OVER THE COUNTER MEDICATION Apply 1 Dose topically 2 (two) times daily. nervestra - otc nerve pain med    . rosuvastatin (CRESTOR) 20 MG tablet Take 1 tablet (20 mg total) by mouth daily. 90 tablet 1  . Vitamins-Lipotropics (LIPOGEN SG)  CAPS Take 1 capsule by mouth daily.    . mirabegron ER (MYRBETRIQ) 25 MG TB24 tablet Take 1 tablet (25 mg total) by mouth daily. (Patient not taking: Reported on 10/30/2016) 30 tablet 11   Current Facility-Administered Medications on File Prior to Visit  Medication Dose Route Frequency Provider Last Rate Last Dose  . 0.9 %  sodium chloride infusion  500 mL Intravenous Continuous Pyrtle, Lajuan Lines, MD       Allergies  Allergen Reactions  . Metronidazole Anaphylaxis  . Amoxicillin-Pot Clavulanate Other (See Comments)    UNKNOWN  . Ciprofloxacin Other (See Comments)    UNKNOWN  . Mesalamine     REACTION: Intolerance   Social History   Social History  . Marital status: Married    Spouse name: N/A  . Number of children: 1  . Years of education: Some colg   Occupational History  . Retired     Social History Main Topics  . Smoking status: Never Smoker  . Smokeless tobacco: Never Used  . Alcohol use 4.2 oz/week    7 Glasses of wine per week  . Drug use: No  . Sexual activity: No   Other Topics Concern  . Not on file   Social History Narrative   1 -2 cups of coffee a day, some tea consumption       Review of Systems  All other systems reviewed and are negative.      Objective:   Physical Exam  HENT:  Head:    Cardiovascular: Normal rate, regular rhythm and normal heart sounds.   Pulmonary/Chest: Effort normal. No respiratory distress. He has no wheezes. He has no rales.  Skin: Rash noted.     Vitals reviewed.   See hpi      Assessment & Plan:  Seborrheic keratoses  Benign prostatic hyperplasia with weak urinary stream  I will treat his seborrheic keratoses with liquid nitrogen cryotherapy. The lesion on his right cheek and just above his right clavicle were each treated with 30 seconds of liquid nitrogen cryotherapy. Wound care was discussed. The 3 lesions on his back as diagrammed, I believe her actinic keratoses. These were each treated with 30 seconds of  liquid nitrogen cryotherapy. If they persist, I will proceed with a biopsy. I believe his lower urinary tract symptoms are due to a combination of BPH as well as overactive bladder. I will first maximize treatment with BPH by starting the patient on jalyn 1 tab poqday and recheck in 2 months.

## 2016-11-11 ENCOUNTER — Other Ambulatory Visit: Payer: Self-pay | Admitting: Family Medicine

## 2016-11-27 ENCOUNTER — Encounter: Payer: Self-pay | Admitting: Family Medicine

## 2016-11-27 ENCOUNTER — Ambulatory Visit (INDEPENDENT_AMBULATORY_CARE_PROVIDER_SITE_OTHER): Payer: PPO | Admitting: Family Medicine

## 2016-11-27 VITALS — BP 110/76 | HR 58 | Temp 97.9°F | Resp 18 | Ht 74.0 in | Wt 228.0 lb

## 2016-11-27 DIAGNOSIS — I10 Essential (primary) hypertension: Secondary | ICD-10-CM

## 2016-11-27 DIAGNOSIS — I672 Cerebral atherosclerosis: Secondary | ICD-10-CM

## 2016-11-27 DIAGNOSIS — Z8673 Personal history of transient ischemic attack (TIA), and cerebral infarction without residual deficits: Secondary | ICD-10-CM | POA: Diagnosis not present

## 2016-11-27 LAB — COMPLETE METABOLIC PANEL WITH GFR
AG Ratio: 1.4 (calc) (ref 1.0–2.5)
ALT: 11 U/L (ref 9–46)
AST: 15 U/L (ref 10–35)
Albumin: 3.9 g/dL (ref 3.6–5.1)
Alkaline phosphatase (APISO): 88 U/L (ref 40–115)
BUN/Creatinine Ratio: 15 (calc) (ref 6–22)
BUN: 25 mg/dL (ref 7–25)
CO2: 29 mmol/L (ref 20–32)
Calcium: 9.6 mg/dL (ref 8.6–10.3)
Chloride: 105 mmol/L (ref 98–110)
Creat: 1.71 mg/dL — ABNORMAL HIGH (ref 0.70–1.18)
GFR, Est African American: 44 mL/min/{1.73_m2} — ABNORMAL LOW (ref >=60)
GFR, Est Non African American: 38 mL/min/{1.73_m2} — ABNORMAL LOW (ref >=60)
Globulin: 2.7 g/dL (calc) (ref 1.9–3.7)
Glucose, Bld: 96 mg/dL (ref 65–99)
Potassium: 5 mmol/L (ref 3.5–5.3)
Sodium: 140 mmol/L (ref 135–146)
Total Bilirubin: 0.8 mg/dL (ref 0.2–1.2)
Total Protein: 6.6 g/dL (ref 6.1–8.1)

## 2016-11-27 LAB — CBC WITH DIFFERENTIAL/PLATELET
Basophils Absolute: 98 cells/uL (ref 0–200)
Basophils Relative: 1 %
Eosinophils Absolute: 911 cells/uL — ABNORMAL HIGH (ref 15–500)
Eosinophils Relative: 9.3 %
HCT: 43.2 % (ref 38.5–50.0)
Hemoglobin: 14.7 g/dL (ref 13.2–17.1)
Lymphs Abs: 2048 cells/uL (ref 850–3900)
MCH: 32.6 pg (ref 27.0–33.0)
MCHC: 34 g/dL (ref 32.0–36.0)
MCV: 95.8 fL (ref 80.0–100.0)
MPV: 9.7 fL (ref 7.5–12.5)
Monocytes Relative: 5.7 %
Neutro Abs: 6184 cells/uL (ref 1500–7800)
Neutrophils Relative %: 63.1 %
Platelets: 258 10*3/uL (ref 140–400)
RBC: 4.51 10*6/uL (ref 4.20–5.80)
RDW: 13.6 % (ref 11.0–15.0)
Total Lymphocyte: 20.9 %
WBC mixed population: 559 cells/uL (ref 200–950)
WBC: 9.8 10*3/uL (ref 3.8–10.8)

## 2016-11-27 LAB — LIPID PANEL
Cholesterol: 181 mg/dL (ref <200)
HDL: 47 mg/dL (ref >40)
LDL Cholesterol (Calc): 114 mg/dL (calc) — ABNORMAL HIGH
Non-HDL Cholesterol (Calc): 134 mg/dL (calc) — ABNORMAL HIGH (ref <130)
Total CHOL/HDL Ratio: 3.9 (calc) (ref <5.0)
Triglycerides: 98 mg/dL (ref <150)

## 2016-11-27 NOTE — Progress Notes (Signed)
Subjective:    Patient ID: Matthew Luna, male    DOB: October 20, 1940, 76 y.o.   MRN: [id number]  HPI  07/2016 He has not been seen in a year. Since last time I saw the patient, he underwent endovascular repair of a AAA and has done well after surgery. He denies any chest pain shortness of breath or dyspnea on exertion. However the patient is no longer taking a statin medication. He is no longer taking aspirin. Past medical history is significant for a TIA. He has chronic dizziness which I attribute to possible vertebrobasilar insufficiency. MRA of the brain in 2016 revealed:  MRA HEAD: High-grade stenosis versus occlusion of proximal RIGHT P1 segment, mid to high-grade stenosis LEFT proximal P2 segment.  Atherosclerosis/stenosis of bilateral superior cerebellar arteries, possible occlusion on the LEFT.  Mild stenosis RIGHT supraclinoid internal carotid artery. Today I spent more than 20 minutes with the patient reviewing the findings of his previous MRA. I explained to him that he was taking an aspirin to prevent future strokes. He was also taking Lipitor in the past to prevent worsening of his atherosclerosis. He had forgot to take the aspirin. He had stopped Lipitor because he thought it contributed to his dizziness. His biggest concern today is dry mouth and dizziness. I again explained to him that some of the dizziness could be related to arterial insufficiency in the posterior circulation. However some of it could also be medication induced. He is on oxybutynin for overactive bladder. He thinks that Celexa is giving him dry mouth. I explained to him that the oxybutynin is most likely causing his dry mouth. He can also be contributing to his dizziness and grogginess. He also complains of blurry vision first thing in the morning. Throughout the day his vision improves. His blood pressure at home has been averaging 140-150/80-90. He is only taking one half tablet of Bystolic every day (5 mg).  At  that time, my plan was: Emphasized to the patient that he must take an aspirin 81 mg a day for secondary prevention of stroke. I would also like him to increase bystolic to 10 mg a day to try to keep his blood pressure less than 140/90. I will check a fasting lipid panel. Goal LDL cholesterol is less than 70. I would recommend starting him back on a statin. He does not want to take Lipitor and therefore I will switch him to Crestor 20 mg a day based on the results of his lab work. Because of his dry mouth, and his dizziness, I will discontinue oxybutynin and replace with Myrbetriq 25 mg a day and recheck his symptoms in 2 weeks. I recommended that he decrease Celexa to 20 mg a day because of his age. I will also consult an ophthalmologist concerning his blurry vision  11/27/16  In July, total cholesterol was 209 and LDL was 139 off statin therapy.  The patient is now compliant with Crestor.  He denies any myalgias.  He denies any right upper quadrant pain.  He is taking aspirin 81 mg a day.  He denies any bleeding or bruising.  He is due today to recheck a fasting lipid panel.  He is now 2 years removed from his TIA.  Overall he is doing well aside from nocturia which we are trying to address recently with the addition of Jalyn.   Past Medical History:  Diagnosis Date  . AAA (abdominal aortic aneurysm) ([place])   . Actinic keratosis   . Allergy  mild   . Anxiety   . CKD (chronic kidney disease) stage 3, GFR 30-59 ml/min (HCC)   . Diverticulosis of colon (without mention of hemorrhage)   . Duodenitis without mention of hemorrhage   . Esophageal reflux   . Hypertrophy of prostate without urinary obstruction and other lower urinary tract symptoms (LUTS)   . Left sided ulcerative (chronic) colitis ([place])   . Mononeuritis of unspecified site   . Neuropathy   . Nonspecific abnormal results of thyroid function study   . Prostate cancer ([place])    cryotherapy 2011 [facility])  . Sleep apnea    ahi 75, cpap 14    . Stricture and stenosis of esophagus   . TIA (transient ischemic attack) 2017   pt states was told mini stroke  . Unspecified essential hypertension    Past Surgical History:  Procedure Laterality Date  . ABDOMINAL AORTIC ANEURYSM REPAIR     2018  . BALLOON DILATION  03/22/2011   Procedure: BALLOON DILATION;  Surgeon: Inda Castle, MD;  Location: [facility];  Service: Endoscopy;  Laterality: N/A;  kelly/ebp  . CHOLECYSTECTOMY    . COLONOSCOPY    . ENDOVASCULAR REPAIR/STENT GRAFT N/A 03/29/2016   Procedure: Endovascular Repair/Stent Graft;  Surgeon: Algernon Huxley, MD;  Location: [place] [facility];  Service: Cardiovascular;  Laterality: N/A;  . ESOPHAGOGASTRODUODENOSCOPY  03/22/2011   Procedure: ESOPHAGOGASTRODUODENOSCOPY (EGD);  Surgeon: Inda Castle, MD;  Location: [facility];  Service: Endoscopy;  Laterality: N/A;  . PROSTATE SURGERY    . UPPER GASTROINTESTINAL ENDOSCOPY     Current Outpatient Prescriptions on File Prior to Visit  Medication Sig Dispense Refill  . aspirin EC 81 MG tablet Take 1 tablet (81 mg total) by mouth daily. 30 tablet 11  . citalopram (CELEXA) 40 MG tablet TAKE 1 TABLET BY MOUTH EVERY DAY 90 tablet 0  . Dutasteride-Tamsulosin HCl (JALYN) 0.5-0.4 MG CAPS Take 1 capsule by mouth daily. 30 capsule 5  . nebivolol (BYSTOLIC) 10 MG tablet Take 5 mg by mouth at bedtime.     . clobetasol (TEMOVATE) 0.05 % external solution Apply 1 application topically 2 (two) times daily. (Patient taking differently: Apply 1 application topically daily as needed (dry skin). ) 50 mL 5  . fluticasone (FLONASE) 50 MCG/ACT nasal spray Place 1 spray into both nostrils daily as needed for allergies.   11  . omeprazole (PRILOSEC) 20 MG capsule Take 1 capsule (20 mg total) by mouth every morning. 30 minutes before breakfast 90 capsule 1  . OVER THE COUNTER MEDICATION Apply 1 Dose topically 2 (two) times daily. nervestra - otc nerve pain med    . rosuvastatin (CRESTOR) 20 MG tablet  Take 1 tablet (20 mg total) by mouth daily. 90 tablet 1  . Vitamins-Lipotropics (LIPOGEN SG) CAPS Take 1 capsule by mouth daily.     Current Facility-Administered Medications on File Prior to Visit  Medication Dose Route Frequency Provider Last Rate Last Dose  . 0.9 %  sodium chloride infusion  500 mL Intravenous Continuous Pyrtle, Lajuan Lines, MD       Allergies  Allergen Reactions  . Metronidazole Anaphylaxis  . Amoxicillin-Pot Clavulanate Other (See Comments)    UNKNOWN  . Ciprofloxacin Other (See Comments)    UNKNOWN  . Mesalamine     REACTION: Intolerance   Social History   Social History  . Marital status: Married    Spouse name: N/A  . Number of children: 1  . Years of education:  Some colg   Occupational History  . Retired     Social History Main Topics  . Smoking status: Never Smoker  . Smokeless tobacco: Never Used  . Alcohol use 4.2 oz/week    7 Glasses of wine per week  . Drug use: No  . Sexual activity: No   Other Topics Concern  . Not on file   Social History Narrative   1 -2 cups of coffee a day, some tea consumption       Review of Systems  All other systems reviewed and are negative.      Objective:   Physical Exam  Constitutional: He is oriented to person, place, and time. He appears well-developed and well-nourished. No distress.  Neck: No JVD present. No thyromegaly present.  Cardiovascular: Normal rate, regular rhythm and normal heart sounds.  Exam reveals no gallop and no friction rub.   No murmur heard. Pulmonary/Chest: Effort normal and breath sounds normal. No respiratory distress. He has no wheezes. He has no rales.  Abdominal: Soft. Bowel sounds are normal. He exhibits no distension and no mass. There is no tenderness. There is no rebound and no guarding.  Lymphadenopathy:    He has no cervical adenopathy.  Neurological: He is alert and oriented to person, place, and time. He has normal reflexes. No cranial nerve deficit. He exhibits  normal muscle tone. Coordination normal.  Skin: He is not diaphoretic.  Vitals reviewed.         Assessment & Plan:  History of CVA (cerebrovascular accident)  Benign essential HTN  Intracranial atherosclerosis Blood pressure today is outstanding.  I will check a fasting lipid panel.  Goal LDL cholesterol is less than 70.  I strongly encourage the patient to be compliant with his Crestor as well as his aspirin.  I explained the intracranial atherosclerosis that he has and its contributions possibly to his dizziness and his previous TIA.  He states that he will be compliant taking Crestor moving forward.  He did not realize the interplay between cholesterol and stroke until now

## 2016-12-14 ENCOUNTER — Encounter: Payer: Self-pay | Admitting: Family Medicine

## 2016-12-26 ENCOUNTER — Encounter (INDEPENDENT_AMBULATORY_CARE_PROVIDER_SITE_OTHER): Payer: Self-pay

## 2016-12-26 ENCOUNTER — Ambulatory Visit (INDEPENDENT_AMBULATORY_CARE_PROVIDER_SITE_OTHER): Payer: PPO | Admitting: Vascular Surgery

## 2016-12-26 ENCOUNTER — Ambulatory Visit (INDEPENDENT_AMBULATORY_CARE_PROVIDER_SITE_OTHER): Payer: PPO

## 2016-12-26 VITALS — BP 170/97 | HR 60 | Resp 18 | Ht 75.0 in

## 2016-12-26 DIAGNOSIS — N183 Chronic kidney disease, stage 3 unspecified: Secondary | ICD-10-CM

## 2016-12-26 DIAGNOSIS — I714 Abdominal aortic aneurysm, without rupture, unspecified: Secondary | ICD-10-CM

## 2016-12-26 DIAGNOSIS — I10 Essential (primary) hypertension: Secondary | ICD-10-CM

## 2016-12-26 NOTE — Assessment & Plan Note (Signed)
His duplex demonstrates significant decrease in the size of his aneurysm now 4.7 cm in maximal diameter.  The stent graft has no endoleak and is widely patent.  Doing well status post endovascular repair earlier this year.  Recheck in 6 months.

## 2016-12-26 NOTE — Patient Instructions (Signed)
Abdominal Aortic Aneurysm Blood pumps away from the heart through tubes (blood vessels) called arteries. Aneurysms are weak or damaged places in the wall of an artery. It bulges out like a balloon. An abdominal aortic aneurysm happens in the main artery of the body (aorta). It can burst or tear, causing bleeding inside the body. This is an emergency. It needs treatment right away. What are the causes? The exact cause is unknown. Things that could cause this problem include:  Fat and other substances building up in the lining of a tube.  Swelling of the walls of a blood vessel.  Certain tissue diseases.  Belly (abdominal) trauma.  An infection in the main artery of the body.  What increases the risk? There are things that make it more likely for you to have an aneurysm. These include:  Being over the age of 75 years old.  Having high blood pressure (hypertension).  Being a male.  Being white.  Being very overweight (obese).  Having a family history of aneurysm.  Using tobacco products.  What are the signs or symptoms? Symptoms depend on the size of the aneurysm and how fast it grows. There may not be symptoms. If symptoms occur, they can include:  Pain (belly, side, lower back, or groin).  Feeling full after eating a small amount of food.  Feeling sick to your stomach (nauseous), throwing up (vomiting), or both.  Feeling a lump in your belly that feels like it is beating (pulsating).  Feeling like you will pass out (faint).  How is this treated?  Medicine to control blood pressure and pain.  Imaging tests to see if the aneurysm gets bigger.  Surgery. How is this prevented? To lessen your chance of getting this condition:  Stop smoking. Stop chewing tobacco.  Limit or avoid alcohol.  Keep your blood pressure, blood sugar, and cholesterol within normal limits.  Eat less salt.  Eat foods low in saturated fats and cholesterol. These are found in animal and  whole dairy products.  Eat more fiber. Fiber is found in whole grains, vegetables, and fruits.  Keep a healthy weight.  Stay active and exercise often.  This information is not intended to replace advice given to you by your health care provider. Make sure you discuss any questions you have with your health care provider. Document Released: 05/13/2012 Document Revised: 06/24/2015 Document Reviewed: 02/16/2012 Elsevier Interactive Patient Education  2017 [employer]  

## 2016-12-26 NOTE — Progress Notes (Signed)
MRN : [id number]  Matthew Luna is a 76 y.o. (1940/02/01) male who presents with chief complaint of  Chief Complaint  Patient presents with  . Follow-up    82mos EVAR  .  History of Present Illness: Patient returns today in follow up of AAA.  He is 9 months s/p EVAR.  He is doing well without complaints today.  His duplex demonstrates significant decrease in the size of his aneurysm now 4.7 cm in maximal diameter.  The stent graft has no endoleak and is widely patent.  Current Outpatient Medications  Medication Sig Dispense Refill  . aspirin EC 81 MG tablet Take 1 tablet (81 mg total) by mouth daily. 30 tablet 11  . citalopram (CELEXA) 40 MG tablet TAKE 1 TABLET BY MOUTH EVERY DAY 90 tablet 0  . clobetasol (TEMOVATE) 0.05 % external solution Apply 1 application topically 2 (two) times daily. (Patient taking differently: Apply 1 application topically daily as needed (dry skin). ) 50 mL 5  . Dutasteride-Tamsulosin HCl (JALYN) 0.5-0.4 MG CAPS Take 1 capsule by mouth daily. 30 capsule 5  . fluticasone (FLONASE) 50 MCG/ACT nasal spray Place 1 spray into both nostrils daily as needed for allergies.   11  . nebivolol (BYSTOLIC) 10 MG tablet Take 5 mg by mouth at bedtime.     Marland Kitchen omeprazole (PRILOSEC) 20 MG capsule Take 1 capsule (20 mg total) by mouth every morning. 30 minutes before breakfast 90 capsule 1  . OVER THE COUNTER MEDICATION Apply 1 Dose topically 2 (two) times daily. nervestra - otc nerve pain med    . rosuvastatin (CRESTOR) 20 MG tablet Take 1 tablet (20 mg total) by mouth daily. (Patient taking differently: Take 40 mg daily by mouth. ) 90 tablet 1  . Vitamins-Lipotropics (LIPOGEN SG) CAPS Take 1 capsule by mouth daily.     Current Facility-Administered Medications  Medication Dose Route Frequency Provider Last Rate Last Dose  . 0.9 %  sodium chloride infusion  500 mL Intravenous Continuous Pyrtle, Lajuan Lines, MD        Past Medical History:  Diagnosis Date  . AAA (abdominal aortic  aneurysm) ([place])   . Actinic keratosis   . Allergy    mild   . Anxiety   . CKD (chronic kidney disease) stage 3, GFR 30-59 ml/min (HCC)   . Diverticulosis of colon (without mention of hemorrhage)   . Duodenitis without mention of hemorrhage   . Esophageal reflux   . Hypertrophy of prostate without urinary obstruction and other lower urinary tract symptoms (LUTS)   . Left sided ulcerative (chronic) colitis ([place])   . Mononeuritis of unspecified site   . Neuropathy   . Nonspecific abnormal results of thyroid function study   . Prostate cancer ([place])    cryotherapy 2011 [facility])  . Sleep apnea    ahi 75, cpap 14  . Stricture and stenosis of esophagus   . TIA (transient ischemic attack) 2017   pt states was told mini stroke  . Unspecified essential hypertension     Past Surgical History:  Procedure Laterality Date  . ABDOMINAL AORTIC ANEURYSM REPAIR     2018  . BALLOON DILATION  03/22/2011   Procedure: BALLOON DILATION;  Surgeon: Inda Castle, MD;  Location: [facility];  Service: Endoscopy;  Laterality: N/A;  kelly/ebp  . CHOLECYSTECTOMY    . COLONOSCOPY    . ENDOVASCULAR REPAIR/STENT GRAFT N/A 03/29/2016   Procedure: Endovascular Repair/Stent Graft;  Surgeon: Algernon Huxley, MD;  Location: [place] [facility];  Service: Cardiovascular;  Laterality: N/A;  . ESOPHAGOGASTRODUODENOSCOPY  03/22/2011   Procedure: ESOPHAGOGASTRODUODENOSCOPY (EGD);  Surgeon: Inda Castle, MD;  Location: [facility];  Service: Endoscopy;  Laterality: N/A;  . PROSTATE SURGERY    . UPPER GASTROINTESTINAL ENDOSCOPY      Social History Social History   Tobacco Use  . Smoking status: Never Smoker  . Smokeless tobacco: Never Used  Substance Use Topics  . Alcohol use: Yes    Alcohol/week: 4.2 oz    Types: 7 Glasses of wine per week  . Drug use: No    Family History Family History  Problem Relation Age of Onset  . Cancer Mother   . Snoring Father   . Cancer Unknown   . Anesthesia problems Neg Hx    . Hypotension Neg Hx   . Malignant hyperthermia Neg Hx   . Pseudochol deficiency Neg Hx   . Colon cancer Neg Hx   . Colon polyps Neg Hx   . Esophageal cancer Neg Hx   . Stomach cancer Neg Hx   . Rectal cancer Neg Hx     Allergies  Allergen Reactions  . Metronidazole Anaphylaxis  . Amoxicillin-Pot Clavulanate Other (See Comments)    UNKNOWN  . Ciprofloxacin Other (See Comments)    UNKNOWN  . Mesalamine     REACTION: Intolerance     REVIEW OF SYSTEMS (Negative unless checked)  Constitutional: [] Weight loss  [] Fever  [] Chills Cardiac: [] Chest pain   [] Chest pressure   [] Palpitations   [] Shortness of breath when laying flat   [] Shortness of breath at rest   [] Shortness of breath with exertion. Vascular:  [] Pain in legs with walking   [] Pain in legs at rest   [] Pain in legs when laying flat   [] Claudication   [] Pain in feet when walking  [] Pain in feet at rest  [] Pain in feet when laying flat   [] History of DVT   [] Phlebitis   [x] Swelling in legs   [] Varicose veins   [] Non-healing ulcers Pulmonary:   [] Uses home oxygen   [] Productive cough   [] Hemoptysis   [] Wheeze  [] COPD   [] Asthma Neurologic:  [] Dizziness  [] Blackouts   [] Seizures   [] History of stroke   [] History of TIA  [] Aphasia   [] Temporary blindness   [] Dysphagia   [] Weakness or numbness in arms   [] Weakness or numbness in legs Musculoskeletal:  [x] Arthritis   [] Joint swelling   [] Joint pain   [x] Low back pain Hematologic:  [] Easy bruising  [] Easy bleeding   [] Hypercoagulable state   [] Anemic   Gastrointestinal:  [] Blood in stool   [] Vomiting blood  [] Gastroesophageal reflux/heartburn   [] Abdominal pain Genitourinary:  [x] Chronic kidney disease   [] Difficult urination  [] Frequent urination  [] Burning with urination   [x] Hematuria Skin:  [] Rashes   [] Ulcers   [] Wounds Psychological:  [] History of anxiety   []  History of major depression.  Physical Examination  BP (!) 170/97 (BP Location: Right Arm)   Pulse 60   Resp 18    Ht 6\' 3"  (1.905 m)   BMI 28.50 kg/m  Gen:  WD/WN, NAD Head: [place]/AT, No temporalis wasting. Ear/Nose/Throat: Hearing grossly intact, nares w/o erythema or drainage, trachea midline Eyes: Conjunctiva clear. Sclera non-icteric Neck: Supple.  No JVD.  Pulmonary:  Good air movement, no use of accessory muscles.  Cardiac: RRR, normal S1, S2 Vascular:  Vessel Right Left  Radial Palpable Palpable  Musculoskeletal: M/S 5/5 throughout.  No deformity or atrophy. Neurologic: Sensation grossly intact in extremities.  Symmetrical.  Speech is fluent.  Psychiatric: Judgment intact, Mood & affect appropriate for pt's clinical situation. Dermatologic: No rashes or ulcers noted.  No cellulitis or open wounds.       Labs Recent Results (from the past 2160 hour(s))  CBC with Differential/Platelet     Status: Abnormal   Collection Time: 11/27/16  9:27 AM  Result Value Ref Range   WBC 9.8 3.8 - 10.8 Thousand/uL   RBC 4.51 4.20 - 5.80 Million/uL   Hemoglobin 14.7 13.2 - 17.1 g/dL   HCT 43.2 38.5 - 50.0 %   MCV 95.8 80.0 - 100.0 fL   MCH 32.6 27.0 - 33.0 pg   MCHC 34.0 32.0 - 36.0 g/dL   RDW 13.6 11.0 - 15.0 %   Platelets 258 140 - 400 Thousand/uL   MPV 9.7 7.5 - 12.5 fL   Neutro Abs 6,184 1,500 - 7,800 cells/uL   Lymphs Abs 2,048 850 - 3,900 cells/uL   WBC mixed population 559 200 - 950 cells/uL   Eosinophils Absolute 911 (H) 15 - 500 cells/uL   Basophils Absolute 98 0 - 200 cells/uL   Neutrophils Relative % 63.1 %   Total Lymphocyte 20.9 %   Monocytes Relative 5.7 %   Eosinophils Relative 9.3 %   Basophils Relative 1.0 %  COMPLETE METABOLIC PANEL WITH GFR     Status: Abnormal   Collection Time: 11/27/16  9:27 AM  Result Value Ref Range   Glucose, Bld 96 65 - 99 mg/dL    Comment: .            Fasting reference interval .    BUN 25 7 - 25 mg/dL   Creat 1.71 (H) 0.70 - 1.18 mg/dL    Comment: For patients >33 years of age, the reference  limit for Creatinine is approximately 13% higher for people identified as African-American. .    GFR, Est Non African American 38 (L) > OR = 60 mL/min/1.67m2   GFR, Est African American 44 (L) > OR = 60 mL/min/1.45m2   BUN/Creatinine Ratio 15 6 - 22 (calc)   Sodium 140 135 - 146 mmol/L   Potassium 5.0 3.5 - 5.3 mmol/L   Chloride 105 98 - 110 mmol/L   CO2 29 20 - 32 mmol/L   Calcium 9.6 8.6 - 10.3 mg/dL   Total Protein 6.6 6.1 - 8.1 g/dL   Albumin 3.9 3.6 - 5.1 g/dL   Globulin 2.7 1.9 - 3.7 g/dL (calc)   AG Ratio 1.4 1.0 - 2.5 (calc)   Total Bilirubin 0.8 0.2 - 1.2 mg/dL   Alkaline phosphatase (APISO) 88 40 - 115 U/L   AST 15 10 - 35 U/L   ALT 11 9 - 46 U/L  Lipid panel     Status: Abnormal   Collection Time: 11/27/16  9:27 AM  Result Value Ref Range   Cholesterol 181 <200 mg/dL   HDL 47 >40 mg/dL   Triglycerides 98 <150 mg/dL   LDL Cholesterol (Calc) 114 (H) mg/dL (calc)    Comment: Reference range: <100 . Desirable range <100 mg/dL for primary prevention;   <70 mg/dL for patients with CHD or diabetic patients  with > or = 2 CHD risk factors. Marland Kitchen LDL-C is now calculated using the Martin-Hopkins  calculation, which is a validated novel method providing  better accuracy than the Friedewald equation in the  estimation of LDL-C.  Cresenciano Genre et al. Annamaria Helling. 4383;818(40): 2061-2068  (http://education.[facility].com/faq/FAQ164)    Total CHOL/HDL Ratio 3.9 <5.0 (calc)   Non-HDL Cholesterol (Calc) 134 (H) <130 mg/dL (calc)    Comment: For patients with diabetes plus 1 major ASCVD risk  factor, treating to a non-HDL-C goal of <100 mg/dL  (LDL-C of <70 mg/dL) is considered a therapeutic  option.     Radiology No results found.    Assessment/Plan Essential hypertension blood pressure control important in reducing the progression of atherosclerotic disease. On appropriate oral medications.   Calculus of kidney Follows with urology for this   AAA (abdominal aortic  aneurysm) without rupture (HCC) His duplex demonstrates significant decrease in the size of his aneurysm now 4.7 cm in maximal diameter.  The stent graft has no endoleak and is widely patent.  Doing well status post endovascular repair earlier this year.  Recheck in 6 months.    Leotis Pain, MD  12/26/2016 11:10 AM    This note was created with Dragon medical transcription system.  Any errors from dictation are purely unintentional

## 2017-02-08 DIAGNOSIS — R339 Retention of urine, unspecified: Secondary | ICD-10-CM | POA: Diagnosis not present

## 2017-02-08 DIAGNOSIS — R350 Frequency of micturition: Secondary | ICD-10-CM | POA: Diagnosis not present

## 2017-02-08 DIAGNOSIS — C61 Malignant neoplasm of prostate: Secondary | ICD-10-CM | POA: Diagnosis not present

## 2017-02-08 DIAGNOSIS — N200 Calculus of kidney: Secondary | ICD-10-CM | POA: Diagnosis not present

## 2017-02-20 ENCOUNTER — Other Ambulatory Visit: Payer: Self-pay | Admitting: Family Medicine

## 2017-03-01 ENCOUNTER — Other Ambulatory Visit: Payer: Self-pay | Admitting: Nurse Practitioner

## 2017-04-05 ENCOUNTER — Ambulatory Visit (INDEPENDENT_AMBULATORY_CARE_PROVIDER_SITE_OTHER): Payer: PPO | Admitting: Family Medicine

## 2017-04-05 ENCOUNTER — Encounter: Payer: Self-pay | Admitting: Family Medicine

## 2017-04-05 VITALS — BP 132/78 | HR 70 | Temp 98.0°F | Resp 18 | Ht 74.0 in | Wt 234.0 lb

## 2017-04-05 DIAGNOSIS — R350 Frequency of micturition: Secondary | ICD-10-CM | POA: Diagnosis not present

## 2017-04-05 DIAGNOSIS — L821 Other seborrheic keratosis: Secondary | ICD-10-CM | POA: Diagnosis not present

## 2017-04-05 NOTE — Progress Notes (Signed)
Subjective:    Patient ID: Matthew Luna, male    DOB: 1940-02-06, 77 y.o.   MRN: [id number]  Medication Refill     07/2016 He has not been seen in a year. Since last time I saw the patient, he underwent endovascular repair of a AAA and has done well after surgery. He denies any chest pain shortness of breath or dyspnea on exertion. However the patient is no longer taking a statin medication. He is no longer taking aspirin. Past medical history is significant for a TIA. He has chronic dizziness which I attribute to possible vertebrobasilar insufficiency. MRA of the brain in 2016 revealed:  MRA HEAD: High-grade stenosis versus occlusion of proximal RIGHT P1 segment, mid to high-grade stenosis LEFT proximal P2 segment.  Atherosclerosis/stenosis of bilateral superior cerebellar arteries, possible occlusion on the LEFT.  Mild stenosis RIGHT supraclinoid internal carotid artery. Today I spent more than 20 minutes with the patient reviewing the findings of his previous MRA. I explained to him that he was taking an aspirin to prevent future strokes. He was also taking Lipitor in the past to prevent worsening of his atherosclerosis. He had forgot to take the aspirin. He had stopped Lipitor because he thought it contributed to his dizziness. His biggest concern today is dry mouth and dizziness. I again explained to him that some of the dizziness could be related to arterial insufficiency in the posterior circulation. However some of it could also be medication induced. He is on oxybutynin for overactive bladder. He thinks that Celexa is giving him dry mouth. I explained to him that the oxybutynin is most likely causing his dry mouth. He can also be contributing to his dizziness and grogginess. He also complains of blurry vision first thing in the morning. Throughout the day his vision improves. His blood pressure at home has been averaging 140-150/80-90. He is only taking one half tablet of Bystolic  every day (5 mg).  At that time, my plan was: Emphasized to the patient that he must take an aspirin 81 mg a day for secondary prevention of stroke. I would also like him to increase bystolic to 10 mg a day to try to keep his blood pressure less than 140/90. I will check a fasting lipid panel. Goal LDL cholesterol is less than 70. I would recommend starting him back on a statin. He does not want to take Lipitor and therefore I will switch him to Crestor 20 mg a day based on the results of his lab work. Because of his dry mouth, and his dizziness, I will discontinue oxybutynin and replace with Myrbetriq 25 mg a day and recheck his symptoms in 2 weeks. I recommended that he decrease Celexa to 20 mg a day because of his age. I will also consult an ophthalmologist concerning his blurry vision  11/27/16  In July, total cholesterol was 209 and LDL was 139 off statin therapy.  The patient is now compliant with Crestor.  He denies any myalgias.  He denies any right upper quadrant pain.  He is taking aspirin 81 mg a day.  He denies any bleeding or bruising.  He is due today to recheck a fasting lipid panel.  He is now 2 years removed from his TIA.  Overall he is doing well aside from nocturia which we are trying to address recently with the addition of Jalyn.  At that time, my plan was: Blood pressure today is outstanding.  I will check a fasting lipid panel.  Goal LDL cholesterol is less than 70.  I strongly encourage the patient to be compliant with his Crestor as well as his aspirin.  I explained the intracranial atherosclerosis that he has and its contributions possibly to his dizziness and his previous TIA.  He states that he will be compliant taking Crestor moving forward.  He did not realize the interplay between cholesterol and stroke until now  04/05/17 Patient is here today requesting cryotherapy on 4 lesions on his neck. There is a 4-5 mm seborrheic keratosis on the posterior aspect of his left shoulder over  the trapezius. There is a 4 mm seborrheic keratosis on the trapezius of his right shoulder. There are 2 very small 2-3 mm brown skin tags versus "SK's" on the right sternocleidomastoid muscle. None of these lesions seem cancerous. However they're irritating the patient and he would like to try to have them removed. He also continues to report nocturia. He states that he has to urinate every hour during the night. He reports weak dribbling stream. This is not improved on Jalyn.  He is also taking myrbetriq.   Past Medical History:  Diagnosis Date  . AAA (abdominal aortic aneurysm) ([place])   . Actinic keratosis   . Allergy    mild   . Anxiety   . CKD (chronic kidney disease) stage 3, GFR 30-59 ml/min (HCC)   . Diverticulosis of colon (without mention of hemorrhage)   . Duodenitis without mention of hemorrhage   . Esophageal reflux   . Hypertrophy of prostate without urinary obstruction and other lower urinary tract symptoms (LUTS)   . Left sided ulcerative (chronic) colitis ([place])   . Mononeuritis of unspecified site   . Neuropathy   . Nonspecific abnormal results of thyroid function study   . Prostate cancer ([place])    cryotherapy 2011 [facility])  . Sleep apnea    ahi 75, cpap 14  . Stricture and stenosis of esophagus   . TIA (transient ischemic attack) 2017   pt states was told mini stroke  . Unspecified essential hypertension    Past Surgical History:  Procedure Laterality Date  . ABDOMINAL AORTIC ANEURYSM REPAIR     2018  . BALLOON DILATION  03/22/2011   Procedure: BALLOON DILATION;  Surgeon: Inda Castle, MD;  Location: [facility];  Service: Endoscopy;  Laterality: N/A;  kelly/ebp  . CHOLECYSTECTOMY    . COLONOSCOPY    . ENDOVASCULAR REPAIR/STENT GRAFT N/A 03/29/2016   Procedure: Endovascular Repair/Stent Graft;  Surgeon: Algernon Huxley, MD;  Location: [facility];  Service: Cardiovascular;  Laterality: N/A;  . ESOPHAGOGASTRODUODENOSCOPY  03/22/2011   Procedure:  ESOPHAGOGASTRODUODENOSCOPY (EGD);  Surgeon: Inda Castle, MD;  Location: [facility];  Service: Endoscopy;  Laterality: N/A;  . PROSTATE SURGERY    . UPPER GASTROINTESTINAL ENDOSCOPY     Current Outpatient Medications on File Prior to Visit  Medication Sig Dispense Refill  . aspirin EC 81 MG tablet Take 1 tablet (81 mg total) by mouth daily. 30 tablet 11  . citalopram (CELEXA) 40 MG tablet TAKE 1 TABLET BY MOUTH EVERY DAY 90 tablet 0  . clobetasol (TEMOVATE) 0.05 % external solution Apply 1 application topically 2 (two) times daily. (Patient taking differently: Apply 1 application topically daily as needed (dry skin). ) 50 mL 5  . Dutasteride-Tamsulosin HCl (JALYN) 0.5-0.4 MG CAPS Take 1 capsule by mouth daily. 30 capsule 5  . fluticasone (FLONASE) 50 MCG/ACT nasal spray Place 1 spray into both nostrils daily  as needed for allergies.   11  . mirabegron ER (MYRBETRIQ) 25 MG TB24 tablet Take 25 mg by mouth daily.    . nebivolol (BYSTOLIC) 10 MG tablet Take 5 mg by mouth at bedtime.     Marland Kitchen omeprazole (PRILOSEC) 20 MG capsule TAKE 1 CAPSULE (20 MG TOTAL) BY MOUTH EVERY MORNING. 30 MINUTES BEFORE BREAKFAST 90 capsule 1  . OVER THE COUNTER MEDICATION Apply 1 Dose topically 2 (two) times daily. nervestra - otc nerve pain med    . rosuvastatin (CRESTOR) 20 MG tablet TAKE 1 TABLET BY MOUTH EVERY DAY 90 tablet 1  . Vitamins-Lipotropics (LIPOGEN SG) CAPS Take 1 capsule by mouth daily.     Current Facility-Administered Medications on File Prior to Visit  Medication Dose Route Frequency Provider Last Rate Last Dose  . 0.9 %  sodium chloride infusion  500 mL Intravenous Continuous Pyrtle, Lajuan Lines, MD       Allergies  Allergen Reactions  . Metronidazole Anaphylaxis  . Amoxicillin-Pot Clavulanate Other (See Comments)    UNKNOWN  . Ciprofloxacin Other (See Comments)    UNKNOWN  . Mesalamine     REACTION: Intolerance   Social History   Socioeconomic History  . Marital status: Married    Spouse  name: Not on file  . Number of children: 1  . Years of education: Some colg  . Highest education level: Not on file  Social Needs  . Financial resource strain: Not on file  . Food insecurity - worry: Not on file  . Food insecurity - inability: Not on file  . Transportation needs - medical: Not on file  . Transportation needs - non-medical: Not on file  Occupational History  . Occupation: Retired   Tobacco Use  . Smoking status: Never Smoker  . Smokeless tobacco: Never Used  Substance and Sexual Activity  . Alcohol use: Yes    Alcohol/week: 4.2 oz    Types: 7 Glasses of wine per week  . Drug use: No  . Sexual activity: No  Other Topics Concern  . Not on file  Social History Narrative   1 -2 cups of coffee a day, some tea consumption       Review of Systems  All other systems reviewed and are negative.      Objective:   Physical Exam  Constitutional: He is oriented to person, place, and time. He appears well-developed and well-nourished. No distress.  Neck:    Cardiovascular: Normal rate, regular rhythm and normal heart sounds. Exam reveals no gallop and no friction rub.  No murmur heard. Pulmonary/Chest: Effort normal and breath sounds normal. No respiratory distress. He has no wheezes. He has no rales.  Musculoskeletal:       Back:  Neurological: He is alert and oriented to person, place, and time. He has normal reflexes.  Skin: He is not diaphoretic.  Vitals reviewed.         Assessment & Plan:  Seborrheic keratosis  Urinary frequency Each of the 4 lesions drawn in red on the diagram were treated with liquid nitrogen cryotherapy for a total of 30 seconds each.  Patient tolerated this well without complication. Urinary frequency persist. However patient is mainly complaining of weak stream. He does not completely empty his bladder at night. Therefore I have recommended that he stop myrbetriq given that this may be exacerbating his BPH and increasing his  nocturia. I encouraged him to continue his Jalyn.

## 2017-04-18 ENCOUNTER — Other Ambulatory Visit: Payer: Self-pay | Admitting: Family Medicine

## 2017-05-03 ENCOUNTER — Other Ambulatory Visit: Payer: Self-pay | Admitting: Family Medicine

## 2017-06-19 ENCOUNTER — Ambulatory Visit: Payer: PPO | Admitting: Nurse Practitioner

## 2017-06-19 ENCOUNTER — Encounter

## 2017-06-26 ENCOUNTER — Ambulatory Visit (INDEPENDENT_AMBULATORY_CARE_PROVIDER_SITE_OTHER): Payer: PPO

## 2017-06-26 ENCOUNTER — Encounter (INDEPENDENT_AMBULATORY_CARE_PROVIDER_SITE_OTHER): Payer: Self-pay | Admitting: Vascular Surgery

## 2017-06-26 ENCOUNTER — Ambulatory Visit (INDEPENDENT_AMBULATORY_CARE_PROVIDER_SITE_OTHER): Payer: PPO | Admitting: Vascular Surgery

## 2017-06-26 VITALS — BP 168/94 | HR 48 | Resp 16 | Ht 73.0 in | Wt 232.0 lb

## 2017-06-26 DIAGNOSIS — I10 Essential (primary) hypertension: Secondary | ICD-10-CM

## 2017-06-26 DIAGNOSIS — I714 Abdominal aortic aneurysm, without rupture, unspecified: Secondary | ICD-10-CM

## 2017-06-26 NOTE — Assessment & Plan Note (Signed)
His duplex today shows continued decrease of the sac size now down to about 4.5 cm in maximal diameter.  Previously this was 4.7 cm 6 months ago.  No endoleak was identified. From his aneurysm standpoint, we can extend his follow-ups to an annual basis.  He will contact our office with any problems in the interim.

## 2017-06-26 NOTE — Progress Notes (Signed)
MRN : [id number]  Matthew Luna is a 77 y.o. (07-Oct-1940) male who presents with chief complaint of  Chief Complaint  Patient presents with  . Follow-up    6 month EVAR  .  History of Present Illness: Patient returns today in follow up of his aneurysm.  He is about 15 months status post endovascular repair of his aneurysm.  He is doing well without complaints from his aneurysm today.  He has some chronic low back pain but no signs of peripheral embolization and no worsening pain.  His duplex today shows continued decrease of the sac size now down to about 4.5 cm in maximal diameter.  Previously this was 4.7 cm 6 months ago.  No endoleak was identified.  Current Outpatient Medications  Medication Sig Dispense Refill  . aspirin EC 81 MG tablet Take 1 tablet (81 mg total) by mouth daily. 30 tablet 11  . citalopram (CELEXA) 40 MG tablet TAKE 1 TABLET BY MOUTH EVERY DAY 90 tablet 0  . clobetasol (TEMOVATE) 0.05 % external solution Apply 1 application topically 2 (two) times daily. (Patient taking differently: Apply 1 application topically daily as needed (dry skin). ) 50 mL 5  . Dutasteride-Tamsulosin HCl (JALYN) 0.5-0.4 MG CAPS Take 1 capsule by mouth daily. 30 capsule 5  . fluticasone (FLONASE) 50 MCG/ACT nasal spray Place 1 spray into both nostrils daily as needed for allergies.   11  . nebivolol (BYSTOLIC) 10 MG tablet Take 5 mg by mouth at bedtime.     Marland Kitchen omeprazole (PRILOSEC) 20 MG capsule Take 1 capsule (20 mg total) by mouth every morning. 30 minutes before breakfast 90 capsule 1  . OVER THE COUNTER MEDICATION Apply 1 Dose topically 2 (two) times daily. nervestra - otc nerve pain med    . rosuvastatin (CRESTOR) 20 MG tablet Take 1 tablet (20 mg total) by mouth daily. (Patient taking differently: Take 40 mg daily by mouth. ) 90 tablet 1  . Vitamins-Lipotropics (LIPOGEN SG) CAPS Take 1 capsule by mouth daily.              Current Facility-Administered Medications  Medication  Dose Route Frequency Provider Last Rate Last Dose  . 0.9 %  sodium chloride infusion  500 mL Intravenous Continuous Pyrtle, Lajuan Lines, MD            Past Medical History:  Diagnosis Date  . AAA (abdominal aortic aneurysm) ([place])   . Actinic keratosis   . Allergy    mild   . Anxiety   . CKD (chronic kidney disease) stage 3, GFR 30-59 ml/min (HCC)   . Diverticulosis of colon (without mention of hemorrhage)   . Duodenitis without mention of hemorrhage   . Esophageal reflux   . Hypertrophy of prostate without urinary obstruction and other lower urinary tract symptoms (LUTS)   . Left sided ulcerative (chronic) colitis ([place])   . Mononeuritis of unspecified site   . Neuropathy   . Nonspecific abnormal results of thyroid function study   . Prostate cancer ([place])    cryotherapy 2011 [facility])  . Sleep apnea    ahi 75, cpap 14  . Stricture and stenosis of esophagus   . TIA (transient ischemic attack) 2017   pt states was told mini stroke  . Unspecified essential hypertension     Past Surgical History:  Procedure Laterality Date  . ABDOMINAL AORTIC ANEURYSM REPAIR     2018  . BALLOON DILATION  03/22/2011   Procedure: BALLOON DILATION;  Surgeon: Inda Castle, MD;  Location: [facility];  Service: Endoscopy;  Laterality: N/A;  kelly/ebp  . CHOLECYSTECTOMY    . COLONOSCOPY    . ENDOVASCULAR REPAIR/STENT GRAFT N/A 03/29/2016   Procedure: Endovascular Repair/Stent Graft;  Surgeon: Algernon Huxley, MD;  Location: [facility];  Service: Cardiovascular;  Laterality: N/A;  . ESOPHAGOGASTRODUODENOSCOPY  03/22/2011   Procedure: ESOPHAGOGASTRODUODENOSCOPY (EGD);  Surgeon: Inda Castle, MD;  Location: [facility];  Service: Endoscopy;  Laterality: N/A;  . PROSTATE SURGERY    . UPPER GASTROINTESTINAL ENDOSCOPY      Social History Social History        Tobacco Use  . Smoking status: Never Smoker  . Smokeless tobacco: Never Used  Substance Use  Topics  . Alcohol use: Yes    Alcohol/week: 4.2 oz    Types: 7 Glasses of wine per week  . Drug use: No    Family History      Family History  Problem Relation Age of Onset  . Cancer Mother   . Snoring Father   . Cancer Unknown   . Anesthesia problems Neg Hx   . Hypotension Neg Hx   . Malignant hyperthermia Neg Hx   . Pseudochol deficiency Neg Hx   . Colon cancer Neg Hx   . Colon polyps Neg Hx   . Esophageal cancer Neg Hx   . Stomach cancer Neg Hx   . Rectal cancer Neg Hx          Allergies  Allergen Reactions  . Metronidazole Anaphylaxis  . Amoxicillin-Pot Clavulanate Other (See Comments)    UNKNOWN  . Ciprofloxacin Other (See Comments)    UNKNOWN  . Mesalamine     REACTION: Intolerance     REVIEW OF SYSTEMS (Negative unless checked)  Constitutional: [] Weight loss  [] Fever  [] Chills Cardiac: [] Chest pain   [] Chest pressure   [] Palpitations   [] Shortness of breath when laying flat   [] Shortness of breath at rest   [] Shortness of breath with exertion. Vascular:  [] Pain in legs with walking   [] Pain in legs at rest   [] Pain in legs when laying flat   [] Claudication   [] Pain in feet when walking  [] Pain in feet at rest  [] Pain in feet when laying flat   [] History of DVT   [] Phlebitis   [x] Swelling in legs   [] Varicose veins   [] Non-healing ulcers Pulmonary:   [] Uses home oxygen   [] Productive cough   [] Hemoptysis   [] Wheeze  [] COPD   [] Asthma Neurologic:  [] Dizziness  [] Blackouts   [] Seizures   [] History of stroke   [] History of TIA  [] Aphasia   [] Temporary blindness   [] Dysphagia   [] Weakness or numbness in arms   [] Weakness or numbness in legs Musculoskeletal:  [x] Arthritis   [] Joint swelling   [] Joint pain   [x] Low back pain Hematologic:  [] Easy bruising  [] Easy bleeding   [] Hypercoagulable state   [] Anemic   Gastrointestinal:  [] Blood in stool   [] Vomiting blood  [] Gastroesophageal reflux/heartburn   [] Abdominal pain Genitourinary:   [x] Chronic kidney disease   [] Difficult urination  [] Frequent urination  [] Burning with urination   [x] Hematuria Skin:  [] Rashes   [] Ulcers   [] Wounds Psychological:  [] History of anxiety   []  History of major depression.      Physical Examination  BP (!) 168/94 (BP Location: Right Arm, Patient Position: Sitting)   Pulse (!) 48   Resp 16   Ht 6\' 1"  (1.854 m)  Wt 232 lb (105.2 kg)   BMI 30.61 kg/m  Gen:  WD/WN, NAD Head: [place]/AT, No temporalis wasting. Ear/Nose/Throat: Hearing grossly intact, nares w/o erythema or drainage Eyes: Conjunctiva clear. Sclera non-icteric Neck: Supple.  Trachea midline Pulmonary:  Good air movement, no use of accessory muscles.  Cardiac: somewhat irregular Vascular:  Vessel Right Left  Radial Palpable Palpable                                   Gastrointestinal: soft, non-tender/non-distended. No increased aortic impulse Musculoskeletal: M/S 5/5 throughout.  No deformity or atrophy. Neurologic: Sensation grossly intact in extremities.  Symmetrical.  Speech is fluent.  Psychiatric: Judgment intact, Mood & affect appropriate for pt's clinical situation. Dermatologic: No rashes or ulcers noted.  No cellulitis or open wounds.       Labs No results found for this or any previous visit (from the past 2160 hour(s)).  Radiology No results found.  Assessment/Plan Essential hypertension blood pressure control important in reducing the progression of atherosclerotic disease. On appropriate oral medications.   Calculus of kidney Follows with urology for this   AAA (abdominal aortic aneurysm) without rupture ([place]) His duplex today shows continued decrease of the sac size now down to about 4.5 cm in maximal diameter.  Previously this was 4.7 cm 6 months ago.  No endoleak was identified. From his aneurysm standpoint, we can extend his follow-ups to an annual basis.  He will contact our office with any problems in the interim.    Leotis Pain, MD  06/26/2017 10:16 AM    This note was created with Dragon medical transcription system.  Any errors from dictation are purely unintentional

## 2017-06-26 NOTE — Patient Instructions (Signed)
Abdominal Aortic Aneurysm Blood pumps away from the heart through tubes (blood vessels) called arteries. Aneurysms are weak or damaged places in the wall of an artery. It bulges out like a balloon. An abdominal aortic aneurysm happens in the main artery of the body (aorta). It can burst or tear, causing bleeding inside the body. This is an emergency. It needs treatment right away. What are the causes? The exact cause is unknown. Things that could cause this problem include:  Fat and other substances building up in the lining of a tube.  Swelling of the walls of a blood vessel.  Certain tissue diseases.  Belly (abdominal) trauma.  An infection in the main artery of the body.  What increases the risk? There are things that make it more likely for you to have an aneurysm. These include:  Being over the age of 76 years old.  Having high blood pressure (hypertension).  Being a male.  Being white.  Being very overweight (obese).  Having a family history of aneurysm.  Using tobacco products.  What are the signs or symptoms? Symptoms depend on the size of the aneurysm and how fast it grows. There may not be symptoms. If symptoms occur, they can include:  Pain (belly, side, lower back, or groin).  Feeling full after eating a small amount of food.  Feeling sick to your stomach (nauseous), throwing up (vomiting), or both.  Feeling a lump in your belly that feels like it is beating (pulsating).  Feeling like you will pass out (faint).  How is this treated?  Medicine to control blood pressure and pain.  Imaging tests to see if the aneurysm gets bigger.  Surgery. How is this prevented? To lessen your chance of getting this condition:  Stop smoking. Stop chewing tobacco.  Limit or avoid alcohol.  Keep your blood pressure, blood sugar, and cholesterol within normal limits.  Eat less salt.  Eat foods low in saturated fats and cholesterol. These are found in animal and  whole dairy products.  Eat more fiber. Fiber is found in whole grains, vegetables, and fruits.  Keep a healthy weight.  Stay active and exercise often.  This information is not intended to replace advice given to you by your health care provider. Make sure you discuss any questions you have with your health care provider. Document Released: 05/13/2012 Document Revised: 06/24/2015 Document Reviewed: 02/16/2012 Elsevier Interactive Patient Education  2017 [employer]  

## 2017-06-28 ENCOUNTER — Encounter: Payer: Self-pay | Admitting: Family Medicine

## 2017-06-28 ENCOUNTER — Ambulatory Visit (INDEPENDENT_AMBULATORY_CARE_PROVIDER_SITE_OTHER): Payer: PPO | Admitting: Family Medicine

## 2017-06-28 VITALS — BP 118/70 | HR 60 | Temp 97.8°F | Resp 18 | Ht 74.0 in | Wt 234.0 lb

## 2017-06-28 DIAGNOSIS — A6920 Lyme disease, unspecified: Secondary | ICD-10-CM | POA: Diagnosis not present

## 2017-06-28 MED ORDER — DOXYCYCLINE HYCLATE 100 MG PO TABS
100.0000 mg | ORAL_TABLET | Freq: Two times a day (BID) | ORAL | 0 refills | Status: DC
Start: 2017-06-28 — End: 2017-07-11

## 2017-06-28 MED ORDER — MOMETASONE FUROATE 0.1 % EX CREA: 1.0000 "application " | TOPICAL_CREAM | Freq: Every day | CUTANEOUS | 0 refills | Status: DC

## 2017-06-28 NOTE — Progress Notes (Signed)
Subjective:    Patient ID: Matthew Luna, male    DOB: [birth year], 77 y.o.   MRN: [id number]  HPI Patient has a circular rash on his anterior right shin for 1 week.  Rash consists of a circular arrangement of petechiae that have coalesced into one larger ring.  They are non-blanchable.  The rash is not well-circumscribed but it is circular in appearance.  It is roughly 4 inches in diameter.  There is some mild central clearing.  Patient believes that there was a tick or other insect bite in the center that he felt over a week ago.  He has been working in a high tall grass and believes that is where he encountered insect.  The rash does not itch.  It does not hurt.  He denies any other rash around his body.  He denies any flulike symptoms Past Medical History:  Diagnosis Date  . AAA (abdominal aortic aneurysm) ([place])   . Actinic keratosis   . Allergy    mild   . Anxiety   . CKD (chronic kidney disease) stage 3, GFR 30-59 ml/min (HCC)   . Diverticulosis of colon (without mention of hemorrhage)   . Duodenitis without mention of hemorrhage   . Esophageal reflux   . Hypertrophy of prostate without urinary obstruction and other lower urinary tract symptoms (LUTS)   . Left sided ulcerative (chronic) colitis ([place])   . Mononeuritis of unspecified site   . Neuropathy   . Nonspecific abnormal results of thyroid function study   . Prostate cancer ([place])    cryotherapy 2011 [facility])  . Sleep apnea    ahi 75, cpap 14  . Stricture and stenosis of esophagus   . TIA (transient ischemic attack) 2017   pt states was told mini stroke  . Unspecified essential hypertension    Past Surgical History:  Procedure Laterality Date  . ABDOMINAL AORTIC ANEURYSM REPAIR     2018  . BALLOON DILATION  03/22/2011   Procedure: BALLOON DILATION;  Surgeon: Inda Castle, MD;  Location: [facility];  Service: Endoscopy;  Laterality: N/A;  kelly/ebp  . CHOLECYSTECTOMY    . COLONOSCOPY    . ENDOVASCULAR REPAIR/STENT  GRAFT N/A 03/29/2016   Procedure: Endovascular Repair/Stent Graft;  Surgeon: Algernon Huxley, MD;  Location: [facility];  Service: Cardiovascular;  Laterality: N/A;  . ESOPHAGOGASTRODUODENOSCOPY  03/22/2011   Procedure: ESOPHAGOGASTRODUODENOSCOPY (EGD);  Surgeon: Inda Castle, MD;  Location: [facility];  Service: Endoscopy;  Laterality: N/A;  . PROSTATE SURGERY    . UPPER GASTROINTESTINAL ENDOSCOPY     Current Outpatient Medications on File Prior to Visit  Medication Sig Dispense Refill  . aspirin EC 81 MG tablet Take 1 tablet (81 mg total) by mouth daily. 30 tablet 11  . citalopram (CELEXA) 40 MG tablet TAKE 1 TABLET BY MOUTH EVERY DAY 90 tablet 3  . Dutasteride-Tamsulosin HCl 0.5-0.4 MG CAPS TAKE 1 CAPSULE BY MOUTH EVERY DAY 30 capsule 5  . finasteride (PROSCAR) 5 MG tablet Take by mouth.    . mirabegron ER (MYRBETRIQ) 25 MG TB24 tablet Take 25 mg by mouth daily.    . nebivolol (BYSTOLIC) 10 MG tablet Take 5 mg by mouth at bedtime.     Marland Kitchen omeprazole (PRILOSEC) 20 MG capsule TAKE 1 CAPSULE (20 MG TOTAL) BY MOUTH EVERY MORNING. 30 MINUTES BEFORE BREAKFAST 90 capsule 1  . rosuvastatin (CRESTOR) 20 MG tablet TAKE 1 TABLET BY MOUTH EVERY DAY 90 tablet 1  .  Vitamins-Lipotropics (LIPOGEN SG) CAPS Take 1 capsule by mouth daily.     Current Facility-Administered Medications on File Prior to Visit  Medication Dose Route Frequency Provider Last Rate Last Dose  . 0.9 %  sodium chloride infusion  500 mL Intravenous Continuous Pyrtle, Lajuan Lines, MD       Allergies  Allergen Reactions  . Metronidazole Anaphylaxis  . Amoxicillin-Pot Clavulanate Other (See Comments)    UNKNOWN  . Ciprofloxacin Other (See Comments)    UNKNOWN  . Mesalamine     REACTION: Intolerance   Social History   Socioeconomic History  . Marital status: Married    Spouse name: Not on file  . Number of children: 1  . Years of education: Some colg  . Highest education level: Not on file  Occupational History  .  Occupation: Retired   Scientific laboratory technician  . Financial resource strain: Not on file  . Food insecurity:    Worry: Not on file    Inability: Not on file  . Transportation needs:    Medical: Not on file    Non-medical: Not on file  Tobacco Use  . Smoking status: Never Smoker  . Smokeless tobacco: Never Used  Substance and Sexual Activity  . Alcohol use: Yes    Alcohol/week: 4.2 oz    Types: 7 Glasses of wine per week  . Drug use: No  . Sexual activity: Never  Lifestyle  . Physical activity:    Days per week: Not on file    Minutes per session: Not on file  . Stress: Not on file  Relationships  . Social connections:    Talks on phone: Not on file    Gets together: Not on file    Attends religious service: Not on file    Active member of club or organization: Not on file    Attends meetings of clubs or organizations: Not on file    Relationship status: Not on file  . Intimate partner violence:    Fear of current or ex partner: Not on file    Emotionally abused: Not on file    Physically abused: Not on file    Forced sexual activity: Not on file  Other Topics Concern  . Not on file  Social History Narrative   1 -2 cups of coffee a day, some tea consumption       Review of Systems  All other systems reviewed and are negative.      Objective:   Physical Exam  Constitutional: He appears well-developed and well-nourished. No distress.  Neck: No JVD present. No thyromegaly present.  Cardiovascular: Normal rate, regular rhythm and normal heart sounds.  Pulmonary/Chest: Effort normal and breath sounds normal.  Abdominal: Soft. Bowel sounds are normal.  Lymphadenopathy:    He has no cervical adenopathy.  Skin: Rash noted. He is not diaphoretic. There is erythema.     Vitals reviewed.         Assessment & Plan:  Erythema migrans (Lyme disease) - Plan: doxycycline (VIBRA-TABS) 100 MG tablet, mometasone (ELOCON) 0.1 % cream, B. burgdorfi antibodies by WB  The rash is  unusual in appearance.  It is not clearly erythema migrans but that is the closest thing that I can think of that would have this appearance.  Therefore I will treat the patient for possible early Lyme disease with doxycycline 100 mg p.o. twice daily for 10 days.  I will obtain Lyme titers.  I will also give the patient Elocon cream  to apply twice daily to the affected area in case this is some type of unusual contact dermatitis.  Recheck in 7 to 14 days or sooner if worsening

## 2017-07-04 ENCOUNTER — Other Ambulatory Visit: Payer: Self-pay | Admitting: Family Medicine

## 2017-07-04 LAB — B. BURGDORFI ANTIBODIES BY WB
B burgdorferi IgG Abs (IB): NEGATIVE
B burgdorferi IgM Abs (IB): POSITIVE — AB
Lyme Disease 18 kD IgG: NONREACTIVE
Lyme Disease 23 kD IgG: NONREACTIVE
Lyme Disease 23 kD IgM: REACTIVE — AB
Lyme Disease 28 kD IgG: NONREACTIVE
Lyme Disease 30 kD IgG: NONREACTIVE
Lyme Disease 39 kD IgG: NONREACTIVE
Lyme Disease 39 kD IgM: NONREACTIVE
Lyme Disease 41 kD IgG: NONREACTIVE
Lyme Disease 41 kD IgM: REACTIVE — AB
Lyme Disease 45 kD IgG: NONREACTIVE
Lyme Disease 58 kD IgG: NONREACTIVE
Lyme Disease 66 kD IgG: NONREACTIVE
Lyme Disease 93 kD IgG: NONREACTIVE

## 2017-07-04 MED ORDER — DOXYCYCLINE HYCLATE 100 MG PO TABS
100.0000 mg | ORAL_TABLET | Freq: Two times a day (BID) | ORAL | 0 refills | Status: DC
Start: 2017-07-04 — End: 2017-10-15

## 2017-07-11 ENCOUNTER — Encounter: Payer: Self-pay | Admitting: Nurse Practitioner

## 2017-07-11 ENCOUNTER — Ambulatory Visit (INDEPENDENT_AMBULATORY_CARE_PROVIDER_SITE_OTHER): Payer: PPO | Admitting: Nurse Practitioner

## 2017-07-11 VITALS — BP 134/70 | HR 64 | Ht 74.0 in | Wt 232.4 lb

## 2017-07-11 DIAGNOSIS — K219 Gastro-esophageal reflux disease without esophagitis: Secondary | ICD-10-CM

## 2017-07-11 DIAGNOSIS — R1310 Dysphagia, unspecified: Secondary | ICD-10-CM

## 2017-07-11 MED ORDER — OMEPRAZOLE 40 MG PO CPDR: 40.0000 mg | DELAYED_RELEASE_CAPSULE | Freq: Every day | ORAL | 3 refills | Status: DC

## 2017-07-11 NOTE — Progress Notes (Addendum)
IMPRESSION and PLAN:    [age] yo male with chronic dysphagia, hx of esophageal strictures requiring dilation. Last EGD with dilation of low grade Schatzki's ring was exactly one year ago. Unfortunately patient got little, if any benefit. Returns with persistent intermittent solid food dysphagia.  -Will discuss with Dr. Hilarie Fredrickson but I don't know that repeat EGD with dilation would be beneficial especially since he didn't get any benefit from previous one. Maybe manometry next step  -taking Doxycycline for tick bite. I stressed importance of taking at least a full glass of water with Doxycycline, since it can esophageal ulcers .    #2.  GERD.  He gets heartburn several times a week but takes Prilosec 20 mg only as needed -Antireflux measures discussed.  No significant nocturnal symptoms -Increase Prilosec to 40 mg every morning 30 minutes before breakfast    Addendum: Reviewed and agree with management. Given dilation only possible to 13 mm after last endoscopy, I would recommend we repeat the endoscopy for repeat dilation.  If we are able to dilate further than 13 mm I expect symptoms would improve further.  Pyrtle, Lajuan Lines, MD    HPI:    Chief Complaint: persistent dysphagia.    Patient is a 77 yo male with hx of AAA s/p repair ~ 1.5 years ago, hx of CKD, HTN, and nephrolithiasis. He is known to Dr. Hilarie Fredrickson for hx of esophageal stricture requiring dilation. I saw him May 2018 with recurrent dysphagia.  He underwent repeat EGD with dilation June 2018 EGD findings:  A low-grade narrowing, Schatzki's ring, found at the GE junction.  Balloon dilation performed to 13 mm.  There was moderate improvement with mucosal rent in the narrowing following dilation.  Stomach was normal, biopsies were taken for histology.  There was moderate information in the duodenal bulb.  Gastric biopsy showed mild inflammation, no H. pylori  Patient has persistent solid food dysphagia . Inquires whether repeat  EGD would be of value. Doesn't feel dilation helped. He gets heartburn several times a week, takes Prilosec 20 mg as needed. Not too much nocturnal hearburn. Takes Nexium sometimes, thinks Prilosec may work better.   Review of systems:     No chest pain, no SOB, no fevers, no urinary sx   Past Medical History:  Diagnosis Date  . AAA (abdominal aortic aneurysm) ([place])   . Actinic keratosis   . Allergy    mild   . Anxiety   . CKD (chronic kidney disease) stage 3, GFR 30-59 ml/min (HCC)   . Diverticulosis of colon (without mention of hemorrhage)   . Duodenitis without mention of hemorrhage   . Esophageal reflux   . Hypertrophy of prostate without urinary obstruction and other lower urinary tract symptoms (LUTS)   . Left sided ulcerative (chronic) colitis ([place])   . Mononeuritis of unspecified site   . Neuropathy   . Nonspecific abnormal results of thyroid function study   . Prostate cancer ([place])    cryotherapy 2011 [facility])  . Schatzki's ring   . Sleep apnea    ahi 75, cpap 14  . Stricture and stenosis of esophagus   . TIA (transient ischemic attack) 2017   pt states was told mini stroke  . Unspecified essential hypertension     Patient's surgical history, family medical history, social history, medications and allergies were all reviewed in Epic   Creatinine clearance cannot be calculated (Patient's most recent lab result is older than the maximum 21  days allowed.)   Physical Exam:     BP 134/70   Pulse 64   Ht 6\' 2"  (1.88 m)   Wt 232 lb 6.4 oz (105.4 kg)   BMI 29.84 kg/m   GENERAL:  Pleasant male in NAD PSYCH: : Cooperative, normal affect EENT:  conjunctiva pink, mucous membranes moist, neck supple without masses CARDIAC:  RRR,  no peripheral edema PULM: Normal respiratory effort, lungs CTA bilaterally, no wheezing ABDOMEN:  Nondistended, soft, nontender. No obvious masses,  normal bowel sounds SKIN:  turgor, no lesions seen Musculoskeletal:  Normal muscle tone, normal  strength NEURO: Alert and oriented x 3, no focal neurologic deficits   Tye Savoy , NP 07/11/2017, 10:37 AM

## 2017-07-11 NOTE — Patient Instructions (Signed)
If you are age 76 or older, your body mass index should be between 23-30. Your Body mass index is 29.84 kg/m. If this is out of the aforementioned range listed, please consider follow up with your Primary Care Provider.  If you are age 22 or younger, your body mass index should be between 19-25. Your Body mass index is 29.84 kg/m. If this is out of the aformentioned range listed, please consider follow up with your Primary Care Provider.   We have sent the following medications to your pharmacy for you to pick up at your convenience: Prilosec 40 mg daily.  Will call you after talking with Dr. Hilarie Fredrickson.  Thank you for choosing me and [facility].   Tye Savoy, NP

## 2017-07-12 ENCOUNTER — Encounter: Payer: Self-pay | Admitting: Nurse Practitioner

## 2017-07-12 ENCOUNTER — Telehealth: Payer: Self-pay

## 2017-07-12 NOTE — Telephone Encounter (Signed)
Left a message to call back.

## 2017-07-12 NOTE — Telephone Encounter (Signed)
-----   Message from Willia Craze, NP sent at 07/12/2017  3:44 PM EDT ----- Matthew Luna, will you tell Matthew Luna please that I spoke personally to Dr. Hilarie Fredrickson.  He would like to repeat EGD with dilation.  He reviewed the last procedure and given the presence of wrents which are small tears that were made after dilating only to about 50mm he feels stricture pretty tight and could benefit from repeat stretching. Can you arrange for the procedure to be done please. Thanks

## 2017-07-17 ENCOUNTER — Encounter: Payer: Self-pay | Admitting: Internal Medicine

## 2017-07-17 ENCOUNTER — Other Ambulatory Visit: Payer: Self-pay

## 2017-07-17 DIAGNOSIS — R1310 Dysphagia, unspecified: Secondary | ICD-10-CM

## 2017-07-18 NOTE — Telephone Encounter (Signed)
Patient agrees to this plan. He says he signed forms at his appointment. He states he cannot come in for another pre-visit. He will bring papers with him.

## 2017-07-25 ENCOUNTER — Other Ambulatory Visit: Payer: Self-pay

## 2017-07-25 ENCOUNTER — Ambulatory Visit (AMBULATORY_SURGERY_CENTER): Payer: PPO | Admitting: Internal Medicine

## 2017-07-25 ENCOUNTER — Encounter: Payer: Self-pay | Admitting: Internal Medicine

## 2017-07-25 VITALS — BP 142/86 | HR 67 | Temp 97.3°F | Resp 18 | Ht 74.0 in | Wt 232.4 lb

## 2017-07-25 DIAGNOSIS — R1310 Dysphagia, unspecified: Secondary | ICD-10-CM | POA: Diagnosis not present

## 2017-07-25 DIAGNOSIS — Z8673 Personal history of transient ischemic attack (TIA), and cerebral infarction without residual deficits: Secondary | ICD-10-CM | POA: Diagnosis not present

## 2017-07-25 DIAGNOSIS — R1319 Other dysphagia: Secondary | ICD-10-CM

## 2017-07-25 DIAGNOSIS — K222 Esophageal obstruction: Secondary | ICD-10-CM

## 2017-07-25 MED ORDER — SODIUM CHLORIDE 0.9 % IV SOLN: 500.0000 mL | Freq: Once | INTRAVENOUS | Status: DC

## 2017-07-25 NOTE — Op Note (Signed)
[facility] Patient Name: Matthew Luna Procedure Date: 07/25/2017 10:05 AM MRN: [id number] Endoscopist: Jerene Bears , MD Age: 77 Referring MD:  Date of Birth: 07-22-1940 Gender: Male Account #: [id number] Procedure:                Upper GI endoscopy Indications:              Dysphagia Medicines:                Monitored Anesthesia Care Procedure:                Pre-Anesthesia Assessment:                           - Prior to the procedure, a History and Physical                            was performed, and patient medications and                            allergies were reviewed. The patient's tolerance of                            previous anesthesia was also reviewed. The risks                            and benefits of the procedure and the sedation                            options and risks were discussed with the patient.                            All questions were answered, and informed consent                            was obtained. Prior Anticoagulants: The patient has                            taken no previous anticoagulant or antiplatelet                            agents. ASA Grade Assessment: III - A patient with                            severe systemic disease. After reviewing the risks                            and benefits, the patient was deemed in                            satisfactory condition to undergo the procedure.                           After obtaining informed consent, the endoscope was  passed under direct vision. Throughout the                            procedure, the patient's blood pressure, pulse, and                            oxygen saturations were monitored continuously. The                            Model GIF-HQ190 [id number]) scope was introduced                            through the mouth, and advanced to the second part                            of duodenum. The upper GI endoscopy was                         accomplished without difficulty. The patient                            tolerated the procedure well. Scope In: Scope Out: Findings:                 A low-grade of narrowing Schatzki ring was found at                            the gastroesophageal junction. A TTS dilator was                            passed through the scope. Dilation with a                            13.5-14.5-15.5 mm balloon dilator was performed to                            13.5 mm. The dilation site was examined and showed                            moderate mucosal disruption. Cold forceps were used                            to further disrupt the Schatzki's ring.                           A 2 cm hiatal hernia was present.                           Patchy mildly erythematous mucosa without bleeding                            was found in the gastric antrum. This was                            previously biopsied  and benign.                           The examined duodenum was normal. Complications:            No immediate complications. Estimated Blood Loss:     Estimated blood loss was minimal. Impression:               - Low-grade of narrowing Schatzki ring. Dilated to                            13.5 mm; cold forceps as above.                           - 2 cm hiatal hernia.                           - Erythematous mucosa in the antrum.                           - Normal examined duodenum. Recommendation:           - Patient has a contact number available for                            emergencies. The signs and symptoms of potential                            delayed complications were discussed with the                            patient. Return to normal activities tomorrow.                            Written discharge instructions were provided to the                            patient.                           - Resume previous diet.                           - Continue present  medications.                           - Repeat upper endoscopy as needed for retreatment.                            Please call in 2 weeks to let me know if swallowing                            trouble has improved. If so, but still an issue                            repeat dilation can be performed in 3-4 weeks. Jerene Bears, MD 07/25/2017 10:27:05  AM This report has been signed electronically.

## 2017-07-25 NOTE — Progress Notes (Signed)
Report given to PACU, vss 

## 2017-07-25 NOTE — Progress Notes (Signed)
Pt's states no medical or surgical changes since previsit or office visit. 

## 2017-07-25 NOTE — Progress Notes (Signed)
Called to room to assist during endoscopic procedure.  Patient ID and intended procedure confirmed with present staff. Received instructions for my participation in the procedure from the performing physician.  

## 2017-07-26 ENCOUNTER — Telehealth: Payer: Self-pay | Admitting: *Deleted

## 2017-07-26 NOTE — Telephone Encounter (Signed)
  Follow up Call-  Call back number 07/25/2017 07/11/2016  Post procedure Call Back phone  # [phone]  Permission to leave phone message Yes Yes  Some recent data might be hidden     Patient questions:  Do you have a fever, pain , or abdominal swelling? No. Pain Score  0 *  Have you tolerated food without any problems? Yes.    Have you been able to return to your normal activities? Yes.    Do you have any questions about your discharge instructions: Diet   No. Medications  No. Follow up visit  No.  Do you have questions or concerns about your Care? No.  Actions: * If pain score is 4 or above: No action needed, pain <4.

## 2017-08-13 ENCOUNTER — Other Ambulatory Visit: Payer: Self-pay | Admitting: Family Medicine

## 2017-08-20 ENCOUNTER — Encounter: Payer: Self-pay | Admitting: Neurology

## 2017-08-21 ENCOUNTER — Other Ambulatory Visit: Payer: Self-pay | Admitting: Family Medicine

## 2017-09-09 ENCOUNTER — Other Ambulatory Visit: Payer: Self-pay | Admitting: Internal Medicine

## 2017-09-10 ENCOUNTER — Other Ambulatory Visit: Payer: Self-pay

## 2017-09-10 MED ORDER — MIRABEGRON ER 25 MG PO TB24: 25.0000 mg | ORAL_TABLET | Freq: Every day | ORAL | 0 refills | Status: DC

## 2017-09-10 NOTE — Progress Notes (Signed)
Patient called in asking for a Mybetriq. Mybetriq sent to the pharmacy.

## 2017-10-09 ENCOUNTER — Other Ambulatory Visit: Payer: Self-pay | Admitting: Nurse Practitioner

## 2017-10-15 ENCOUNTER — Encounter: Payer: Self-pay | Admitting: Family Medicine

## 2017-10-15 ENCOUNTER — Ambulatory Visit (INDEPENDENT_AMBULATORY_CARE_PROVIDER_SITE_OTHER): Payer: PPO | Admitting: Family Medicine

## 2017-10-15 VITALS — BP 138/80 | HR 78 | Temp 97.8°F | Resp 18 | Ht 74.0 in | Wt 227.0 lb

## 2017-10-15 DIAGNOSIS — R350 Frequency of micturition: Secondary | ICD-10-CM | POA: Diagnosis not present

## 2017-10-15 DIAGNOSIS — Z23 Encounter for immunization: Secondary | ICD-10-CM

## 2017-10-15 NOTE — Progress Notes (Signed)
Subjective:    Patient ID: Matthew Luna, male    DOB: [birth year], 77 y.o.   MRN: [id number]  Patient is here today reporting frequent episodes of nocturia.  He states that he wakes up 4 and 5 times every evening to go and urinate.  Believes the Myrbetriq we started him on has been somewhat beneficial however it has only helped a little bit.  In the past he tried oxybutynin in the care of a urologist however that medication made him feel extremely dizzy and drowsy.  He denies frequency during the day.  He states that he urinates approximately 3-4 times a day.  He states that he has a normal stream.  There is no evidence of urinary retention or symptoms of urinary retention.  Past medical history is significant for prostate cancer treated with cryotherapy at [facility] in 2011.  Prostate exam is performed today and shows no prostatic enlargement.  There is no tenderness to palpation. Past Medical History:  Diagnosis Date  . AAA (abdominal aortic aneurysm) ([place])   . Actinic keratosis   . Allergy    mild   . Anxiety   . CKD (chronic kidney disease) stage 3, GFR 30-59 ml/min (HCC)   . Diverticulosis of colon (without mention of hemorrhage)   . Duodenitis without mention of hemorrhage   . Esophageal reflux   . Hypertrophy of prostate without urinary obstruction and other lower urinary tract symptoms (LUTS)   . Left sided ulcerative (chronic) colitis ([place])   . Mononeuritis of unspecified site   . Neuropathy   . Nonspecific abnormal results of thyroid function study   . Prostate cancer ([place])    cryotherapy 2011 [facility])  . Schatzki's ring   . Sleep apnea    ahi 75, cpap 14  . Stricture and stenosis of esophagus   . TIA (transient ischemic attack) 2017   pt states was told mini stroke  . Unspecified essential hypertension    Past Surgical History:  Procedure Laterality Date  . ABDOMINAL AORTIC ANEURYSM REPAIR     2018  . BALLOON DILATION  03/22/2011   Procedure: BALLOON DILATION;  Surgeon: Inda Castle, MD;  Location: [facility];  Service: Endoscopy;  Laterality: N/A;  kelly/ebp  . CHOLECYSTECTOMY    . COLONOSCOPY    . ENDOVASCULAR REPAIR/STENT GRAFT N/A 03/29/2016   Procedure: Endovascular Repair/Stent Graft;  Surgeon: Algernon Huxley, MD;  Location: [facility];  Service: Cardiovascular;  Laterality: N/A;  . ESOPHAGOGASTRODUODENOSCOPY  03/22/2011   Procedure: ESOPHAGOGASTRODUODENOSCOPY (EGD);  Surgeon: Inda Castle, MD;  Location: [facility];  Service: Endoscopy;  Laterality: N/A;  . PROSTATE SURGERY    . UPPER GASTROINTESTINAL ENDOSCOPY     Current Outpatient Medications on File Prior to Visit  Medication Sig Dispense Refill  . aspirin EC 81 MG tablet Take 1 tablet (81 mg total) by mouth daily. 30 tablet 11  . citalopram (CELEXA) 40 MG tablet TAKE 1 TABLET BY MOUTH EVERY DAY 90 tablet 3  . mirabegron ER (MYRBETRIQ) 25 MG TB24 tablet Take 1 tablet (25 mg total) by mouth daily. 90 tablet 0  . nebivolol (BYSTOLIC) 10 MG tablet Take 5 mg by mouth at bedtime.     . Vitamins-Lipotropics (LIPOGEN SG) CAPS Take 1 capsule by mouth daily.     No current facility-administered medications on file prior to visit.    Allergies  Allergen Reactions  . Metronidazole Anaphylaxis  . Amoxicillin-Pot Clavulanate Other (See Comments)  UNKNOWN  . Ciprofloxacin Other (See Comments)    UNKNOWN  . Mesalamine     REACTION: Intolerance   Social History   Socioeconomic History  . Marital status: Married    Spouse name: Not on file  . Number of children: 1  . Years of education: Some colg  . Highest education level: Not on file  Occupational History  . Occupation: Retired   Scientific laboratory technician  . Financial resource strain: Not on file  . Food insecurity:    Worry: Not on file    Inability: Not on file  . Transportation needs:    Medical: Not on file    Non-medical: Not on file  Tobacco Use  . Smoking status: Never Smoker  . Smokeless tobacco: Never Used  Substance and Sexual  Activity  . Alcohol use: Yes    Alcohol/week: 7.0 standard drinks    Types: 7 Glasses of wine per week  . Drug use: No  . Sexual activity: Never  Lifestyle  . Physical activity:    Days per week: Not on file    Minutes per session: Not on file  . Stress: Not on file  Relationships  . Social connections:    Talks on phone: Not on file    Gets together: Not on file    Attends religious service: Not on file    Active member of club or organization: Not on file    Attends meetings of clubs or organizations: Not on file    Relationship status: Not on file  . Intimate partner violence:    Fear of current or ex partner: Not on file    Emotionally abused: Not on file    Physically abused: Not on file    Forced sexual activity: Not on file  Other Topics Concern  . Not on file  Social History Narrative   1 -2 cups of coffee a day, some tea consumption       Review of Systems  All other systems reviewed and are negative.      Objective:   Physical Exam  Constitutional: He is oriented to person, place, and time. He appears well-developed and well-nourished. No distress.  Neck: No JVD present. No thyromegaly present.  Cardiovascular: Normal rate, regular rhythm and normal heart sounds. Exam reveals no gallop and no friction rub.  No murmur heard. Pulmonary/Chest: Effort normal and breath sounds normal. No respiratory distress. He has no wheezes. He has no rales.  Abdominal: Soft. Bowel sounds are normal. He exhibits no distension and no mass. There is no tenderness. There is no rebound and no guarding.  Genitourinary: Prostate normal. Prostate is not enlarged and not tender.  Lymphadenopathy:    He has no cervical adenopathy.  Neurological: He is alert and oriented to person, place, and time. He has normal reflexes. No cranial nerve deficit. He exhibits normal muscle tone. Coordination normal.  Skin: He is not diaphoretic.  Vitals reviewed.         Assessment & Plan:    Urinary frequency - Plan: CBC with Differential/Platelet, COMPLETE METABOLIC PANEL WITH GFR, PSA, Urinalysis, Routine w reflex microscopic  Need for prophylactic vaccination and inoculation against influenza - Plan: Flu Vaccine QUAD 36+ mos IM Prostate exam does not suggest BPH.  I will check a PSA.  I will also check a urinalysis to evaluate for urinary tract infection, etc.  I will check a CMP to evaluate for diabetes as a possible cause of urinary frequency.  However I  believe the majority of his symptoms are due to overactive bladder.  I recommended increasing Myrbetriq to 50 mg a day.  If lab work is normal and this is not beneficial, consider adding Vesicare to Myrbetriq.

## 2017-10-16 LAB — COMPLETE METABOLIC PANEL WITH GFR
AG Ratio: 1.6 (calc) (ref 1.0–2.5)
ALT: 7 U/L — ABNORMAL LOW (ref 9–46)
AST: 13 U/L (ref 10–35)
Albumin: 4 g/dL (ref 3.6–5.1)
Alkaline phosphatase (APISO): 81 U/L (ref 40–115)
BUN/Creatinine Ratio: 15 (calc) (ref 6–22)
BUN: 25 mg/dL (ref 7–25)
CO2: 25 mmol/L (ref 20–32)
Calcium: 9.8 mg/dL (ref 8.6–10.3)
Chloride: 108 mmol/L (ref 98–110)
Creat: 1.72 mg/dL — ABNORMAL HIGH (ref 0.70–1.18)
GFR, Est African American: 44 mL/min/{1.73_m2} — ABNORMAL LOW (ref >=60)
GFR, Est Non African American: 38 mL/min/{1.73_m2} — ABNORMAL LOW (ref >=60)
Globulin: 2.5 g/dL (calc) (ref 1.9–3.7)
Glucose, Bld: 78 mg/dL (ref 65–99)
Potassium: 4.3 mmol/L (ref 3.5–5.3)
Sodium: 144 mmol/L (ref 135–146)
Total Bilirubin: 0.6 mg/dL (ref 0.2–1.2)
Total Protein: 6.5 g/dL (ref 6.1–8.1)

## 2017-10-16 LAB — CBC WITH DIFFERENTIAL/PLATELET
Basophils Absolute: 125 cells/uL (ref 0–200)
Basophils Relative: 1.5 %
Eosinophils Absolute: 789 cells/uL — ABNORMAL HIGH (ref 15–500)
Eosinophils Relative: 9.5 %
HCT: 45 % (ref 38.5–50.0)
Hemoglobin: 15.2 g/dL (ref 13.2–17.1)
Lymphs Abs: 2706 cells/uL (ref 850–3900)
MCH: 32.8 pg (ref 27.0–33.0)
MCHC: 33.8 g/dL (ref 32.0–36.0)
MCV: 97 fL (ref 80.0–100.0)
MPV: 10 fL (ref 7.5–12.5)
Monocytes Relative: 6.9 %
Neutro Abs: 4109 cells/uL (ref 1500–7800)
Neutrophils Relative %: 49.5 %
Platelets: 262 10*3/uL (ref 140–400)
RBC: 4.64 10*6/uL (ref 4.20–5.80)
RDW: 13.6 % (ref 11.0–15.0)
Total Lymphocyte: 32.6 %
WBC mixed population: 573 cells/uL (ref 200–950)
WBC: 8.3 10*3/uL (ref 3.8–10.8)

## 2017-10-16 LAB — URINALYSIS, ROUTINE W REFLEX MICROSCOPIC
Bilirubin Urine: NEGATIVE
Glucose, UA: NEGATIVE
Hgb urine dipstick: NEGATIVE
Ketones, ur: NEGATIVE
Leukocytes, UA: NEGATIVE
Nitrite: NEGATIVE
Protein, ur: NEGATIVE
Specific Gravity, Urine: 1.027 (ref 1.001–1.03)
pH: <=5 (ref 5.0–8.0)

## 2017-10-16 LAB — PSA: PSA: <0.1 ng/mL (ref <=4.0)

## 2017-10-30 ENCOUNTER — Other Ambulatory Visit: Payer: Self-pay | Admitting: Family Medicine

## 2017-11-24 ENCOUNTER — Other Ambulatory Visit: Payer: Self-pay | Admitting: Family Medicine

## 2017-12-05 IMAGING — RF DG ESOPHAGUS
12 series · 12 of 12 positions shown · non-contrast
Comparison: None.

CLINICAL DATA: Difficulty swallowing.

EXAM:
ESOPHOGRAM / BARIUM SWALLOW / BARIUM TABLET STUDY
TECHNIQUE: Combined double contrast and single contrast examination performed
using effervescent crystals, thick barium liquid, and thin barium
liquid. The patient was observed with fluoroscopy swallowing a 13 mm
barium sulphate tablet.
FLUOROSCOPY TIME:  Fluoroscopy Time:  2 minutes
Number of Acquired Spot Images: 0

[Series 1: cp_standard · 0.17mm/px · 1 of 1 slices shown (1 of 12)]
[im 1/1]
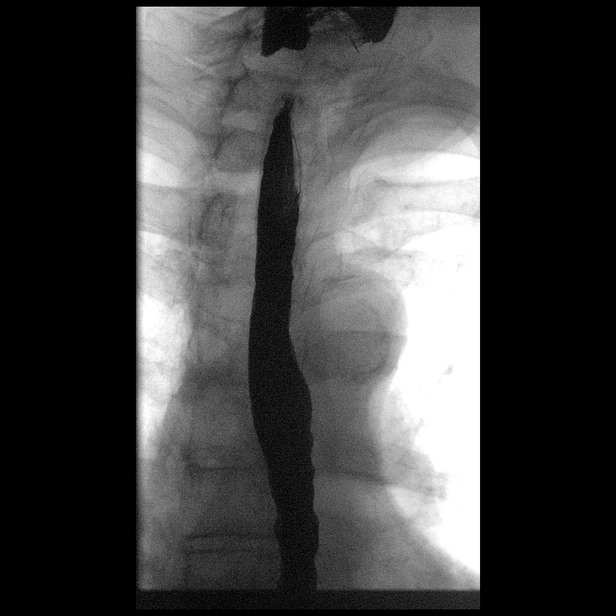

[Series 2: cp_standard · 0.17mm/px · 1 of 1 slices shown (2 of 12)]
[im 1/1]
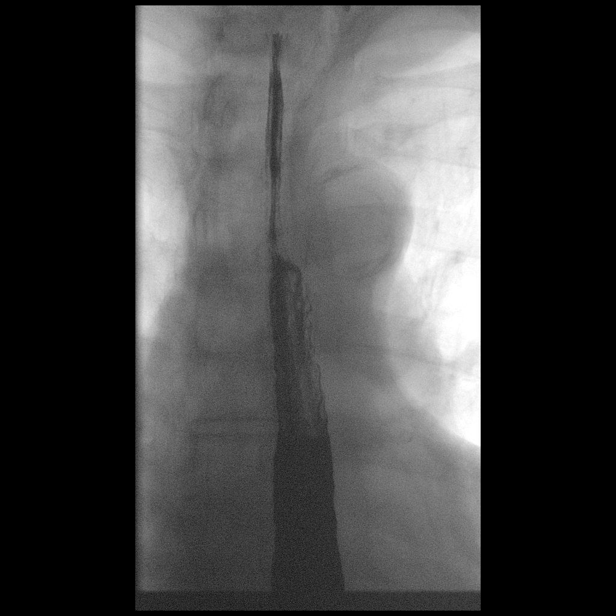

[Series 3: cp_standard · 0.17mm/px · 1 of 1 slices shown (3 of 12)]
[im 1/1]
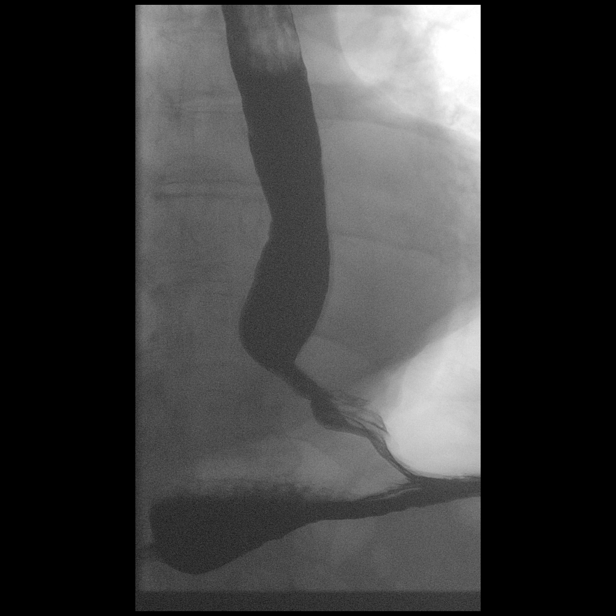

[Series 4: cp_standard · 0.17mm/px · 1 of 1 slices shown (4 of 12)]
[im 1/1]
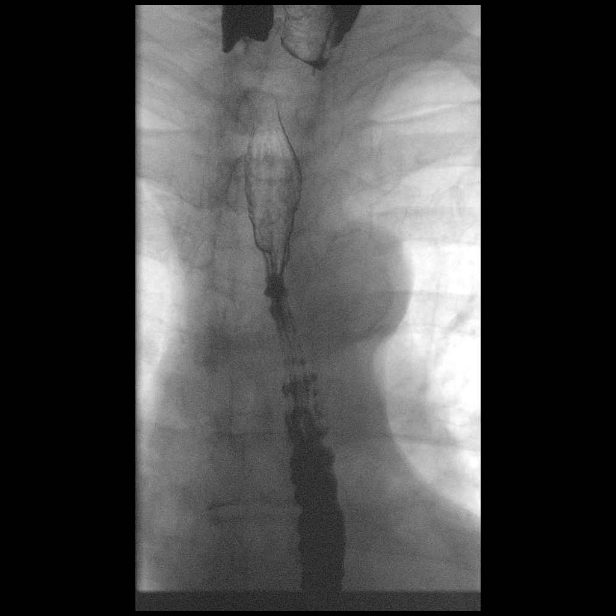

[Series 5: cp_standard · 0.17mm/px · 1 of 1 slices shown (5 of 12)]
[im 1/1]
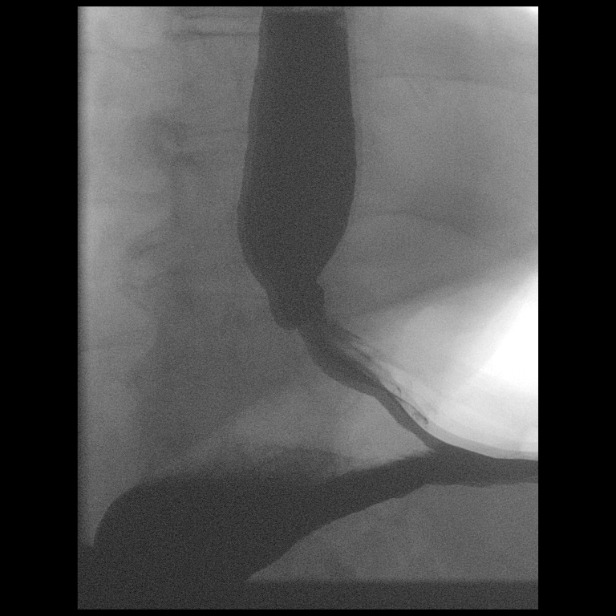

[Series 6: cp_standard · 0.18mm/px · 1 of 1 slices shown (6 of 12)]
[im 1/1]
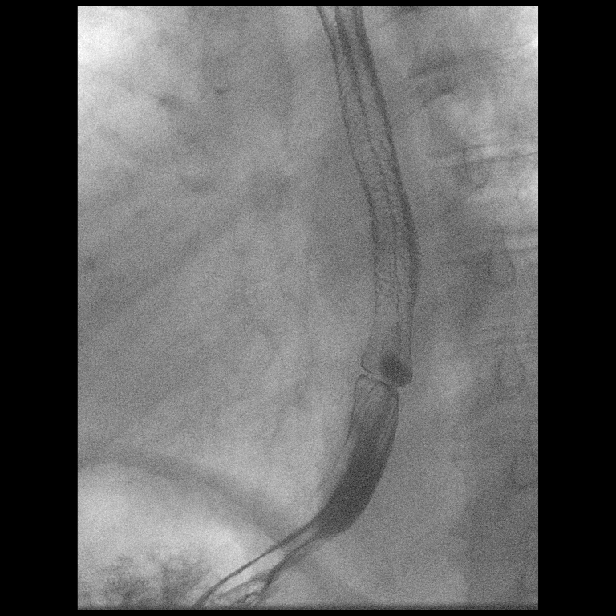

[Series 7: cp_standard · 0.18mm/px · 1 of 1 slices shown (7 of 12)]
[im 1/1]
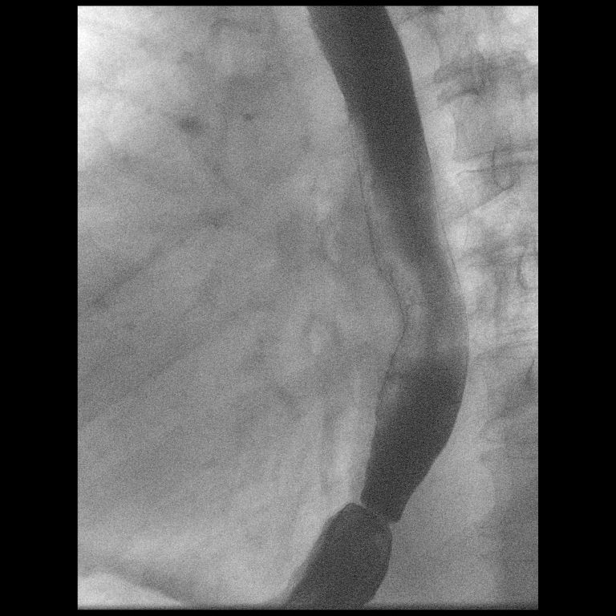

[Series 8: cp_standard · 0.18mm/px · 1 of 1 slices shown (8 of 12)]
[im 1/1]
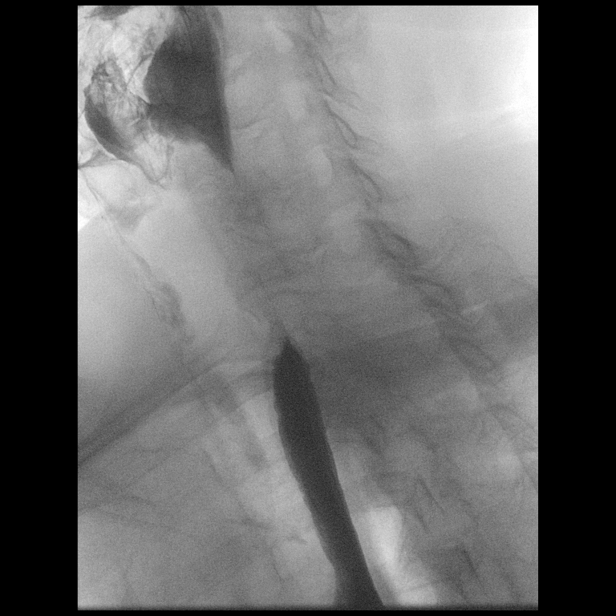

[Series 9: cp_standard · 0.18mm/px · 1 of 1 slices shown (9 of 12)]
[im 1/1]
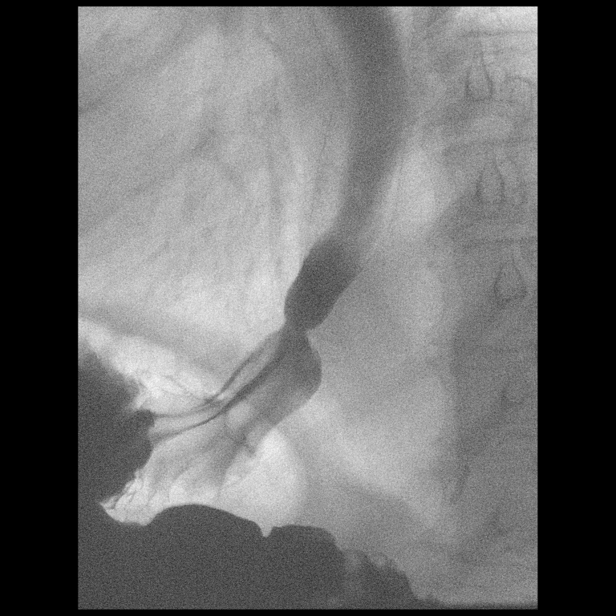

[Series 10: cp_standard · 0.18mm/px · 1 of 1 slices shown (10 of 12)]
[im 1/1]
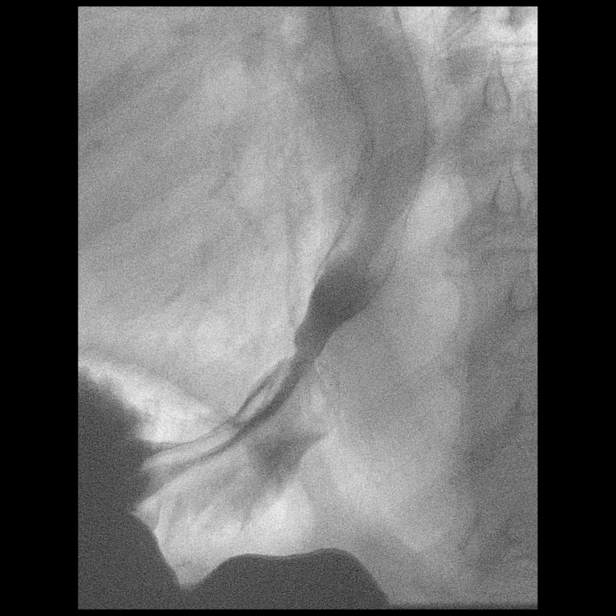

[Series 11: cp_standard · 0.17mm/px · 1 of 1 slices shown (11 of 12)]
[im 1/1]
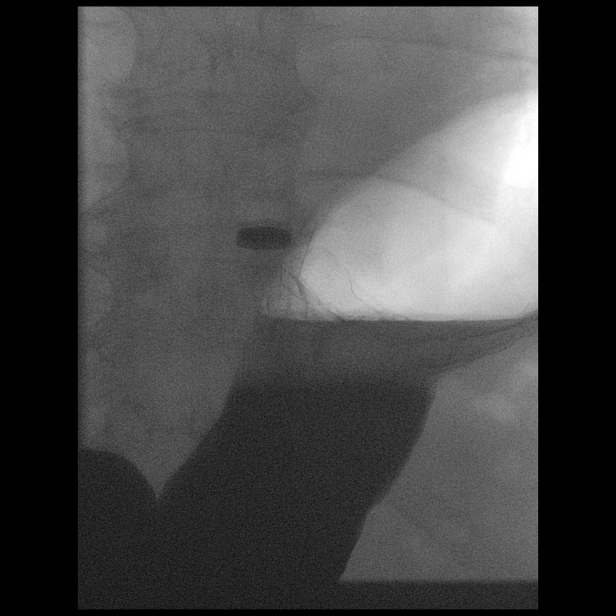

[Series 12: cp_standard · 0.17mm/px · 1 of 1 slices shown (12 of 12)]
[im 1/1]
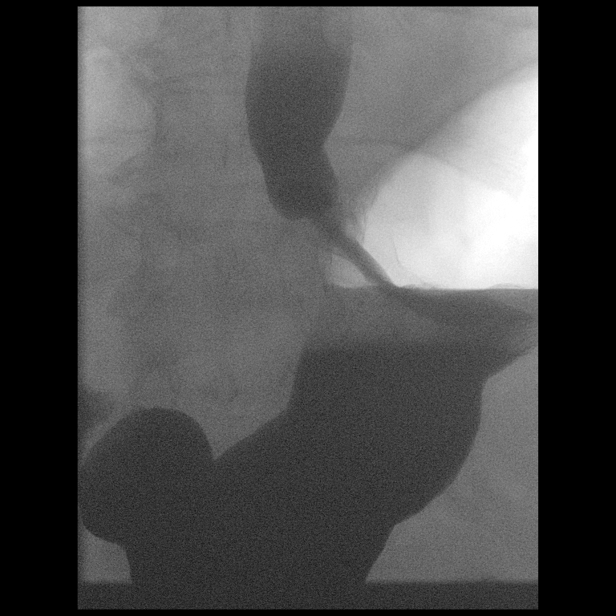

[12 of 12 positions shown; findings below may reference images not displayed]

FINDINGS: There is a small hiatal hernia identified. Just above the hiatal
hernia is a Schatzki's ring. The diameter of the esophagus at the
level of the ring is approximately 6 mm. A 13 mm barium tab was
ingested which was unable to pass through the ring.

Motility of the esophagus is unremarkable.  No reflux identified.
IMPRESSION: 1. Small hiatal hernia with Schatzki's ring just above the level of
the hernia. The diameter of the esophagus at the level of the ring
is approximately 6 mm.

## 2017-12-12 ENCOUNTER — Other Ambulatory Visit: Payer: Self-pay | Admitting: Family Medicine

## 2017-12-12 MED ORDER — MIRABEGRON ER 50 MG PO TB24: 50.0000 mg | ORAL_TABLET | Freq: Every day | ORAL | 5 refills | Status: DC

## 2017-12-19 ENCOUNTER — Telehealth: Payer: Self-pay | Admitting: Family Medicine

## 2017-12-19 NOTE — Telephone Encounter (Signed)
What is he using it for, I am glad to send it in for seb dermatitis.

## 2017-12-19 NOTE — Telephone Encounter (Signed)
Pt called in stating he wants "MGP" for top of his head called in to [facility] [address].

## 2017-12-19 NOTE — Telephone Encounter (Signed)
Ok to send this into his pharmacy?

## 2017-12-20 MED ORDER — CLOBETASOL PROPIONATE 0.05 % EX SOLN: 1.0000 "application " | Freq: Two times a day (BID) | CUTANEOUS | 5 refills | Status: DC

## 2017-12-20 NOTE — Telephone Encounter (Signed)
Pt got this from his dermatologist to treat the scaling on his scalp. He was dx'd with seborrheic dermatitis. Per Dr. Dennard Schaumann ok to send in rx - med sent.

## 2018-01-15 DIAGNOSIS — H6693 Otitis media, unspecified, bilateral: Secondary | ICD-10-CM | POA: Diagnosis not present

## 2018-01-15 DIAGNOSIS — H6123 Impacted cerumen, bilateral: Secondary | ICD-10-CM | POA: Diagnosis not present

## 2018-01-15 DIAGNOSIS — H9190 Unspecified hearing loss, unspecified ear: Secondary | ICD-10-CM | POA: Diagnosis not present

## 2018-02-17 ENCOUNTER — Other Ambulatory Visit: Payer: Self-pay | Admitting: Family Medicine

## 2018-04-01 ENCOUNTER — Telehealth: Payer: Self-pay | Admitting: Family Medicine

## 2018-04-01 MED ORDER — CLOBETASOL PROPIONATE 0.05 % EX SOLN: 1.0000 "application " | Freq: Two times a day (BID) | CUTANEOUS | 5 refills | Status: DC

## 2018-04-01 MED ORDER — NEBIVOLOL HCL 10 MG PO TABS: 5.0000 mg | ORAL_TABLET | Freq: Every day | ORAL | 1 refills | Status: DC

## 2018-04-01 NOTE — Telephone Encounter (Signed)
Pt needs refill on clobetasol and bystolic to [facility].

## 2018-04-01 NOTE — Telephone Encounter (Signed)
Medication called/sent to requested pharmacy  

## 2018-04-11 ENCOUNTER — Ambulatory Visit (INDEPENDENT_AMBULATORY_CARE_PROVIDER_SITE_OTHER): Payer: PPO | Admitting: Family Medicine

## 2018-04-11 ENCOUNTER — Other Ambulatory Visit: Payer: Self-pay

## 2018-04-11 ENCOUNTER — Encounter: Payer: Self-pay | Admitting: Family Medicine

## 2018-04-11 VITALS — BP 110/78 | HR 70 | Temp 98.1°F | Resp 16 | Ht 74.0 in | Wt 225.0 lb

## 2018-04-11 DIAGNOSIS — G609 Hereditary and idiopathic neuropathy, unspecified: Secondary | ICD-10-CM

## 2018-04-11 DIAGNOSIS — K5792 Diverticulitis of intestine, part unspecified, without perforation or abscess without bleeding: Secondary | ICD-10-CM | POA: Diagnosis not present

## 2018-04-11 MED ORDER — AMOXICILLIN-POT CLAVULANATE 875-125 MG PO TABS: 1.0000 | ORAL_TABLET | Freq: Two times a day (BID) | ORAL | 0 refills | Status: DC

## 2018-04-11 MED ORDER — GABAPENTIN 300 MG PO CAPS: 300.0000 mg | ORAL_CAPSULE | Freq: Every day | ORAL | 3 refills | Status: DC

## 2018-04-11 NOTE — Progress Notes (Signed)
Subjective:    Patient ID: Matthew Luna, male    DOB: 1940-04-03, 78 y.o.   MRN: [id number]  Patient presents today with 2 specific concerns.  #1 he reports pain in both feet right greater than left.  The pain is a burning stinging pain.  It is worse primarily at night.  He describes it like standing on hot asphalt.  He also reports numbness.  He has no sensation to 10 g monofilament distal to the MTP joints on his right foot.  He also has diminished operation sensation and cold sensation.  The pain keeps him awake at night.  It burns and throbs only in his feet.  It does not radiate into his lower back or down his legs.  He also complains of one-week history of left lower quadrant abdominal pain.  The pain is sharp.  It comes and goes in waves.  He is tender to palpation in the left lower quadrant.  He denies any hematuria or dysuria.  He has reported some constipation recently.  A week ago he states that he was extremely constipated however that has improved however the pain continues to occur.  Today he has voluntary guarding but no rebound on palpation of the left lower quadrant  Past Medical History:  Diagnosis Date  . AAA (abdominal aortic aneurysm) ([place])   . Actinic keratosis   . Allergy    mild   . Anxiety   . CKD (chronic kidney disease) stage 3, GFR 30-59 ml/min (HCC)   . Diverticulosis of colon (without mention of hemorrhage)   . Duodenitis without mention of hemorrhage   . Esophageal reflux   . Hypertrophy of prostate without urinary obstruction and other lower urinary tract symptoms (LUTS)   . Left sided ulcerative (chronic) colitis ([place])   . Mononeuritis of unspecified site   . Neuropathy   . Nonspecific abnormal results of thyroid function study   . Prostate cancer ([place])    cryotherapy 2011 [facility])  . Schatzki's ring   . Sleep apnea    ahi 75, cpap 14  . Stricture and stenosis of esophagus   . TIA (transient ischemic attack) 2017   pt states was told mini stroke  .  Unspecified essential hypertension    Past Surgical History:  Procedure Laterality Date  . ABDOMINAL AORTIC ANEURYSM REPAIR     2018  . BALLOON DILATION  03/22/2011   Procedure: BALLOON DILATION;  Surgeon: Inda Castle, MD;  Location: [facility];  Service: Endoscopy;  Laterality: N/A;  kelly/ebp  . CHOLECYSTECTOMY    . COLONOSCOPY    . ENDOVASCULAR REPAIR/STENT GRAFT N/A 03/29/2016   Procedure: Endovascular Repair/Stent Graft;  Surgeon: Algernon Huxley, MD;  Location: [facility];  Service: Cardiovascular;  Laterality: N/A;  . ESOPHAGOGASTRODUODENOSCOPY  03/22/2011   Procedure: ESOPHAGOGASTRODUODENOSCOPY (EGD);  Surgeon: Inda Castle, MD;  Location: [facility];  Service: Endoscopy;  Laterality: N/A;  . PROSTATE SURGERY    . UPPER GASTROINTESTINAL ENDOSCOPY     Current Outpatient Medications on File Prior to Visit  Medication Sig Dispense Refill  . aspirin EC 81 MG tablet Take 1 tablet (81 mg total) by mouth daily. 30 tablet 11  . citalopram (CELEXA) 40 MG tablet TAKE 1 TABLET BY MOUTH EVERY DAY 90 tablet 3  . clobetasol (TEMOVATE) 0.05 % external solution Apply 1 application topically 2 (two) times daily. 50 mL 5  . Dutasteride-Tamsulosin HCl 0.5-0.4 MG CAPS TAKE 1 CAPSULE BY MOUTH EVERY DAY  90 capsule 1  . mirabegron ER (MYRBETRIQ) 50 MG TB24 tablet Take 1 tablet (50 mg total) by mouth daily. 30 tablet 5  . nebivolol (BYSTOLIC) 10 MG tablet Take 0.5 tablets (5 mg total) by mouth at bedtime. 90 tablet 1  . rosuvastatin (CRESTOR) 20 MG tablet TAKE 1 TABLET BY MOUTH EVERY DAY 90 tablet 1  . Vitamins-Lipotropics (LIPOGEN SG) CAPS Take 1 capsule by mouth daily.     No current facility-administered medications on file prior to visit.    Allergies  Allergen Reactions  . Metronidazole Anaphylaxis  . Amoxicillin-Pot Clavulanate Other (See Comments)    UNKNOWN  . Ciprofloxacin Other (See Comments)    UNKNOWN  . Mesalamine     REACTION: Intolerance   Social History    Socioeconomic History  . Marital status: Married    Spouse name: Not on file  . Number of children: 1  . Years of education: Some colg  . Highest education level: Not on file  Occupational History  . Occupation: Retired   Scientific laboratory technician  . Financial resource strain: Not on file  . Food insecurity:    Worry: Not on file    Inability: Not on file  . Transportation needs:    Medical: Not on file    Non-medical: Not on file  Tobacco Use  . Smoking status: Never Smoker  . Smokeless tobacco: Never Used  Substance and Sexual Activity  . Alcohol use: Yes    Alcohol/week: 7.0 standard drinks    Types: 7 Glasses of wine per week  . Drug use: No  . Sexual activity: Never  Lifestyle  . Physical activity:    Days per week: Not on file    Minutes per session: Not on file  . Stress: Not on file  Relationships  . Social connections:    Talks on phone: Not on file    Gets together: Not on file    Attends religious service: Not on file    Active member of club or organization: Not on file    Attends meetings of clubs or organizations: Not on file    Relationship status: Not on file  . Intimate partner violence:    Fear of current or ex partner: Not on file    Emotionally abused: Not on file    Physically abused: Not on file    Forced sexual activity: Not on file  Other Topics Concern  . Not on file  Social History Narrative   1 -2 cups of coffee a day, some tea consumption       Review of Systems  All other systems reviewed and are negative.      Objective:   Physical Exam  Constitutional: He is oriented to person, place, and time. He appears well-developed and well-nourished. No distress.  Neck: No JVD present. No thyromegaly present.  Cardiovascular: Normal rate, regular rhythm and normal heart sounds. Exam reveals no gallop and no friction rub.  No murmur heard. Pulmonary/Chest: Effort normal and breath sounds normal. No respiratory distress. He has no wheezes. He has no  rales.  Abdominal: Soft. Bowel sounds are normal. He exhibits no distension and no mass. There is abdominal tenderness. There is guarding. There is no rebound.    Genitourinary:    Prostate normal.  Prostate is not enlarged and not tender.  Lymphadenopathy:    He has no cervical adenopathy.  Neurological: He is alert and oriented to person, place, and time. He has normal strength and  normal reflexes. A sensory deficit is present. No cranial nerve deficit. He exhibits normal muscle tone. Coordination normal.  Skin: He is not diaphoretic.  Vitals reviewed.         Assessment & Plan:  I suspect the patient may have a mild case of diverticulitis plus constipation.  I recommended trying Linzess 145 mcg p.o. daily and use Augmentin 875 mg p.o. twice daily for 10 days.  Patient had an anaphylactic reaction to metronidazole.  He had delirium on Cipro.  He states that he is taken penicillin in the past without problems even though Augmentin is listed as an allergy.  He believes the allergy was upset stomach.  Therefore I will use this medication as his option is limited.    Patient also has peripheral neuropathy in his right foot greater than his left foot as well as difficulty sleeping.  We will try gabapentin 300 mg p.o. nightly for both neuropathy and insomnia.  Patient requested to take imipramine that was prescribed by another doctor 8 years ago to help him sleep at night.  I have recommended avoiding that if we are going to try the gabapentin for his neuropathy.

## 2018-04-17 DIAGNOSIS — M9903 Segmental and somatic dysfunction of lumbar region: Secondary | ICD-10-CM | POA: Diagnosis not present

## 2018-04-17 DIAGNOSIS — M5137 Other intervertebral disc degeneration, lumbosacral region: Secondary | ICD-10-CM | POA: Diagnosis not present

## 2018-05-03 ENCOUNTER — Other Ambulatory Visit: Payer: Self-pay | Admitting: Family Medicine

## 2018-06-18 ENCOUNTER — Ambulatory Visit (INDEPENDENT_AMBULATORY_CARE_PROVIDER_SITE_OTHER): Payer: PPO | Admitting: Family Medicine

## 2018-06-18 ENCOUNTER — Encounter: Payer: Self-pay | Admitting: Family Medicine

## 2018-06-18 ENCOUNTER — Other Ambulatory Visit: Payer: Self-pay

## 2018-06-18 VITALS — BP 138/76 | HR 68 | Temp 98.6°F | Resp 18 | Ht 74.0 in | Wt 225.0 lb

## 2018-06-18 DIAGNOSIS — L03011 Cellulitis of right finger: Secondary | ICD-10-CM | POA: Diagnosis not present

## 2018-06-18 MED ORDER — SULFAMETHOXAZOLE-TRIMETHOPRIM 800-160 MG PO TABS: 1.0000 | ORAL_TABLET | Freq: Two times a day (BID) | ORAL | 0 refills | Status: DC

## 2018-06-18 MED ORDER — HYDROCODONE-ACETAMINOPHEN 5-325 MG PO TABS: 1.0000 | ORAL_TABLET | Freq: Four times a day (QID) | ORAL | 0 refills | Status: DC | PRN

## 2018-06-18 NOTE — Progress Notes (Signed)
Subjective:    Patient ID: Matthew Luna, male    DOB: 07-29-1940, 78 y.o.   MRN: [id number]  Patient presents today with a swollen red painful right fourth digit.  Symptoms began about 1 week ago.  He "smashed" the tip of his finger with a pry bar while working on a motor.  Initially there was no pain.  There was no redness.  About 3 days ago, the pain became moderate in intensity.  The finger distal to the DIP joint became red swollen and tender.  Patient has had pus drained from underneath the proximal nail fold.  He is poked it several times with a sterilized needle to try to release the pus.  Finger began to throb yesterday prompting him to make this appointment.  The distal portion of his right fourth digit from roughly the level of the DIP joint all the way down to the tip of the finger is red circumferentially.  There is significant swelling in the proximal nail fold.  There is a trace amount of pus visible underneath the proximal nail fold.  The anger is throbbing and tender to touch  Past Medical History:  Diagnosis Date  . AAA (abdominal aortic aneurysm) ([place])   . Actinic keratosis   . Allergy    mild   . Anxiety   . CKD (chronic kidney disease) stage 3, GFR 30-59 ml/min (HCC)   . Diverticulosis of colon (without mention of hemorrhage)   . Duodenitis without mention of hemorrhage   . Esophageal reflux   . Hypertrophy of prostate without urinary obstruction and other lower urinary tract symptoms (LUTS)   . Left sided ulcerative (chronic) colitis ([place])   . Mononeuritis of unspecified site   . Neuropathy   . Nonspecific abnormal results of thyroid function study   . Prostate cancer ([place])    cryotherapy 2011 [facility])  . Schatzki's ring   . Sleep apnea    ahi 75, cpap 14  . Stricture and stenosis of esophagus   . TIA (transient ischemic attack) 2017   pt states was told mini stroke  . Unspecified essential hypertension    Past Surgical History:  Procedure Laterality Date  .  ABDOMINAL AORTIC ANEURYSM REPAIR     2018  . BALLOON DILATION  03/22/2011   Procedure: BALLOON DILATION;  Surgeon: Inda Castle, MD;  Location: [facility];  Service: Endoscopy;  Laterality: N/A;  kelly/ebp  . CHOLECYSTECTOMY    . COLONOSCOPY    . ENDOVASCULAR REPAIR/STENT GRAFT N/A 03/29/2016   Procedure: Endovascular Repair/Stent Graft;  Surgeon: Algernon Huxley, MD;  Location: [facility];  Service: Cardiovascular;  Laterality: N/A;  . ESOPHAGOGASTRODUODENOSCOPY  03/22/2011   Procedure: ESOPHAGOGASTRODUODENOSCOPY (EGD);  Surgeon: Inda Castle, MD;  Location: [facility];  Service: Endoscopy;  Laterality: N/A;  . PROSTATE SURGERY    . UPPER GASTROINTESTINAL ENDOSCOPY     Current Outpatient Medications on File Prior to Visit  Medication Sig Dispense Refill  . amoxicillin-clavulanate (AUGMENTIN) 875-125 MG tablet Take 1 tablet by mouth 2 (two) times daily. 20 tablet 0  . aspirin EC 81 MG tablet Take 1 tablet (81 mg total) by mouth daily. 30 tablet 11  . citalopram (CELEXA) 40 MG tablet TAKE 1 TABLET BY MOUTH EVERY DAY 90 tablet 3  . clobetasol (TEMOVATE) 0.05 % external solution Apply 1 application topically 2 (two) times daily. 50 mL 5  . gabapentin (NEURONTIN) 300 MG capsule TAKE 1 CAPSULE (300 MG TOTAL) BY  MOUTH AT BEDTIME. FOR NEUROPATHY 90 capsule 2  . imipramine (TOFRANIL) 25 MG tablet Take 25 mg by mouth at bedtime.    . mirabegron ER (MYRBETRIQ) 50 MG TB24 tablet Take 1 tablet (50 mg total) by mouth daily. (Patient taking differently: Take 25 mg by mouth daily. ) 30 tablet 5  . nebivolol (BYSTOLIC) 10 MG tablet Take 0.5 tablets (5 mg total) by mouth at bedtime. 90 tablet 1   No current facility-administered medications on file prior to visit.    Allergies  Allergen Reactions  . Metronidazole Anaphylaxis  . Amoxicillin-Pot Clavulanate Other (See Comments)    UNKNOWN  . Ciprofloxacin Other (See Comments)    UNKNOWN  . Mesalamine     REACTION: Intolerance   Social  History   Socioeconomic History  . Marital status: Married    Spouse name: Not on file  . Number of children: 1  . Years of education: Some colg  . Highest education level: Not on file  Occupational History  . Occupation: Retired   Scientific laboratory technician  . Financial resource strain: Not on file  . Food insecurity:    Worry: Not on file    Inability: Not on file  . Transportation needs:    Medical: Not on file    Non-medical: Not on file  Tobacco Use  . Smoking status: Never Smoker  . Smokeless tobacco: Never Used  Substance and Sexual Activity  . Alcohol use: Yes    Alcohol/week: 7.0 standard drinks    Types: 7 Glasses of wine per week  . Drug use: No  . Sexual activity: Never  Lifestyle  . Physical activity:    Days per week: Not on file    Minutes per session: Not on file  . Stress: Not on file  Relationships  . Social connections:    Talks on phone: Not on file    Gets together: Not on file    Attends religious service: Not on file    Active member of club or organization: Not on file    Attends meetings of clubs or organizations: Not on file    Relationship status: Not on file  . Intimate partner violence:    Fear of current or ex partner: Not on file    Emotionally abused: Not on file    Physically abused: Not on file    Forced sexual activity: Not on file  Other Topics Concern  . Not on file  Social History Narrative   1 -2 cups of coffee a day, some tea consumption       Review of Systems  All other systems reviewed and are negative.      Objective:   Physical Exam  Constitutional: He appears well-developed and well-nourished. No distress.  Cardiovascular: Normal rate, regular rhythm and normal heart sounds. Exam reveals no gallop and no friction rub.  No murmur heard. Pulmonary/Chest: Effort normal and breath sounds normal. No respiratory distress. He has no wheezes. He has no rales.  Abdominal: Soft. Bowel sounds are normal.  Musculoskeletal:      Right hand: He exhibits tenderness, deformity and swelling.       Hands:  Neurological: He has normal strength. A sensory deficit is present.  Skin: He is not diaphoretic.  Vitals reviewed. + Please see the description in the present illness.  Patient has erythema wrapping around the entire distal fourth digit of his right hand from the DIP joint distally.  There is also a trace amount of  pus weeping from underneath the proximal nail fold        Assessment & Plan:  Cellulitis of the fourth digit with paronychia  I anesthetized the right fourth finger using a digital block with 0.1% lidocaine with out epinephrine.  A tourniquet was applied at the base of the finger.  Using a scalpel, I inserted the tip of the scalpel underneath the proximal nail fold and release blood and pus which was cultured.  I then probed and cleaned the area thoroughly with Q-tips using hydrogen peroxide.  No residual pus was expressed from the wound.  Begin Bactrim double strength tablets 1 p.o. twice daily immediately.  Recheck in 48 hours.  The wound was covered with Neosporin wrapped with petroleum gauze and then wrapped with coban.  Tourniquet was removed.  Total tourniquet time was less than 2 minutes.  Estimated blood loss was 5 mL

## 2018-06-19 NOTE — Addendum Note (Signed)
Addended by: Shary Decamp B on: 06/19/2018 09:08 AM   Modules accepted: Orders

## 2018-06-20 ENCOUNTER — Encounter: Payer: Self-pay | Admitting: Family Medicine

## 2018-06-20 ENCOUNTER — Other Ambulatory Visit: Payer: Self-pay

## 2018-06-20 ENCOUNTER — Ambulatory Visit (INDEPENDENT_AMBULATORY_CARE_PROVIDER_SITE_OTHER): Payer: PPO | Admitting: Family Medicine

## 2018-06-20 VITALS — BP 180/100 | HR 62 | Temp 98.3°F | Resp 16 | Ht 74.0 in | Wt 225.0 lb

## 2018-06-20 DIAGNOSIS — L03011 Cellulitis of right finger: Secondary | ICD-10-CM

## 2018-06-20 NOTE — Progress Notes (Signed)
Subjective:    Patient ID: Matthew Luna, male    DOB: [birth year], 78 y.o.   MRN: [id number]  06/18/18 Patient presents today with a swollen red painful right fourth digit.  Symptoms began about 1 week ago.  He "smashed" the tip of his finger with a pry bar while working on a motor.  Initially there was no pain.  There was no redness.  About 3 days ago, the pain became moderate in intensity.  The finger distal to the DIP joint became red swollen and tender.  Patient has had pus drained from underneath the proximal nail fold.  He is poked it several times with a sterilized needle to try to release the pus.  Finger began to throb yesterday prompting him to make this appointment.  The distal portion of his right fourth digit from roughly the level of the DIP joint all the way down to the tip of the finger is red circumferentially.  There is significant swelling in the proximal nail fold.  There is a trace amount of pus visible underneath the proximal nail fold.  The anger is throbbing and tender to touch.  At that time, my plan was: I anesthetized the right fourth finger using a digital block with 0.1% lidocaine with out epinephrine.  A tourniquet was applied at the base of the finger.  Using a scalpel, I inserted the tip of the scalpel underneath the proximal nail fold and release blood and pus which was cultured.  I then probed and cleaned the area thoroughly with Q-tips using hydrogen peroxide.  No residual pus was expressed from the wound.  Begin Bactrim double strength tablets 1 p.o. twice daily immediately.  Recheck in 48 hours.  The wound was covered with Neosporin wrapped with petroleum gauze and then wrapped with coban.  Tourniquet was removed.  Total tourniquet time was less than 2 minutes.  Estimated blood loss was 5 mL  06/20/18 Patient is here today for follow-up.  Redness has subsided and faded since his last visit although still present.  Patient states his finger is still throbbing at night  although better.  There is no further purulent drainage coming from under the proximal nail fold.  I undressed his finger today.  The erythema is less.  Palpation of the proximal nail fold does not express any additional pus.  However the finger is still quite tender.  Past Medical History:  Diagnosis Date  . AAA (abdominal aortic aneurysm) ([place])   . Actinic keratosis   . Allergy    mild   . Anxiety   . CKD (chronic kidney disease) stage 3, GFR 30-59 ml/min (HCC)   . Diverticulosis of colon (without mention of hemorrhage)   . Duodenitis without mention of hemorrhage   . Esophageal reflux   . Hypertrophy of prostate without urinary obstruction and other lower urinary tract symptoms (LUTS)   . Left sided ulcerative (chronic) colitis ([place])   . Mononeuritis of unspecified site   . Neuropathy   . Nonspecific abnormal results of thyroid function study   . Prostate cancer ([place])    cryotherapy 2011 [facility])  . Schatzki's ring   . Sleep apnea    ahi 75, cpap 14  . Stricture and stenosis of esophagus   . TIA (transient ischemic attack) 2017   pt states was told mini stroke  . Unspecified essential hypertension    Past Surgical History:  Procedure Laterality Date  . ABDOMINAL AORTIC ANEURYSM REPAIR  2018  Larrie Kass DILATION  03/22/2011   Procedure: Larrie Kass DILATION;  Surgeon: Inda Castle, MD;  Location: [facility];  Service: Endoscopy;  Laterality: N/A;  kelly/ebp  . CHOLECYSTECTOMY    . COLONOSCOPY    . ENDOVASCULAR REPAIR/STENT GRAFT N/A 03/29/2016   Procedure: Endovascular Repair/Stent Graft;  Surgeon: Algernon Huxley, MD;  Location: [facility];  Service: Cardiovascular;  Laterality: N/A;  . ESOPHAGOGASTRODUODENOSCOPY  03/22/2011   Procedure: ESOPHAGOGASTRODUODENOSCOPY (EGD);  Surgeon: Inda Castle, MD;  Location: [facility];  Service: Endoscopy;  Laterality: N/A;  . PROSTATE SURGERY    . UPPER GASTROINTESTINAL ENDOSCOPY     Current Outpatient Medications on File  Prior to Visit  Medication Sig Dispense Refill  . aspirin EC 81 MG tablet Take 1 tablet (81 mg total) by mouth daily. 30 tablet 11  . citalopram (CELEXA) 40 MG tablet TAKE 1 TABLET BY MOUTH EVERY DAY 90 tablet 3  . clobetasol (TEMOVATE) 0.05 % external solution Apply 1 application topically 2 (two) times daily. 50 mL 5  . gabapentin (NEURONTIN) 300 MG capsule TAKE 1 CAPSULE (300 MG TOTAL) BY MOUTH AT BEDTIME. FOR NEUROPATHY 90 capsule 2  . HYDROcodone-acetaminophen (NORCO) 5-325 MG tablet Take 1 tablet by mouth every 6 (six) hours as needed for moderate pain. 10 tablet 0  . imipramine (TOFRANIL) 25 MG tablet Take 25 mg by mouth at bedtime.    . mirabegron ER (MYRBETRIQ) 50 MG TB24 tablet Take 1 tablet (50 mg total) by mouth daily. (Patient taking differently: Take 25 mg by mouth daily. ) 30 tablet 5  . nebivolol (BYSTOLIC) 10 MG tablet Take 0.5 tablets (5 mg total) by mouth at bedtime. 90 tablet 1  . sulfamethoxazole-trimethoprim (BACTRIM DS) 800-160 MG tablet Take 1 tablet by mouth 2 (two) times daily. 14 tablet 0   No current facility-administered medications on file prior to visit.    Allergies  Allergen Reactions  . Metronidazole Anaphylaxis  . Amoxicillin-Pot Clavulanate Other (See Comments)    UNKNOWN  . Ciprofloxacin Other (See Comments)    UNKNOWN  . Mesalamine     REACTION: Intolerance   Social History   Socioeconomic History  . Marital status: Married    Spouse name: Not on file  . Number of children: 1  . Years of education: Some colg  . Highest education level: Not on file  Occupational History  . Occupation: Retired   Scientific laboratory technician  . Financial resource strain: Not on file  . Food insecurity:    Worry: Not on file    Inability: Not on file  . Transportation needs:    Medical: Not on file    Non-medical: Not on file  Tobacco Use  . Smoking status: Never Smoker  . Smokeless tobacco: Never Used  Substance and Sexual Activity  . Alcohol use: Yes    Alcohol/week:  7.0 standard drinks    Types: 7 Glasses of wine per week  . Drug use: No  . Sexual activity: Never  Lifestyle  . Physical activity:    Days per week: Not on file    Minutes per session: Not on file  . Stress: Not on file  Relationships  . Social connections:    Talks on phone: Not on file    Gets together: Not on file    Attends religious service: Not on file    Active member of club or organization: Not on file    Attends meetings of clubs or organizations: Not  on file    Relationship status: Not on file  . Intimate partner violence:    Fear of current or ex partner: Not on file    Emotionally abused: Not on file    Physically abused: Not on file    Forced sexual activity: Not on file  Other Topics Concern  . Not on file  Social History Narrative   1 -2 cups of coffee a day, some tea consumption       Review of Systems  All other systems reviewed and are negative.      Objective:   Physical Exam  Constitutional: He appears well-developed and well-nourished. No distress.  Cardiovascular: Normal rate, regular rhythm and normal heart sounds. Exam reveals no gallop and no friction rub.  No murmur heard. Pulmonary/Chest: Effort normal and breath sounds normal. No respiratory distress. He has no wheezes. He has no rales.  Abdominal: Soft. Bowel sounds are normal.  Musculoskeletal:     Right hand: He exhibits tenderness, deformity and swelling.       Hands:  Neurological: He has normal strength.  Skin: He is not diaphoretic.  Vitals reviewed.         Assessment & Plan:  Paronychia of finger, right  Cellulitis of finger of right hand  Erythema has faded although still present but the wound seems to be improving and the cellulitis seems to be improving.  There is no additional pus leaking from under the proximal nail fold.  I coated the tip of his finger and Silvadene.  I wrapped it with petroleum gauze and then wrapped it gently with coban.  We discussed wound  care and how to perform wound care daily.  Finish his antibiotic.  Recheck next week if not substantially better or sooner if getting worse.

## 2018-06-21 ENCOUNTER — Ambulatory Visit (INDEPENDENT_AMBULATORY_CARE_PROVIDER_SITE_OTHER): Payer: PPO | Admitting: Family Medicine

## 2018-06-21 ENCOUNTER — Encounter: Payer: Self-pay | Admitting: Family Medicine

## 2018-06-21 VITALS — BP 146/80 | HR 76 | Temp 98.7°F | Resp 18 | Ht 74.0 in | Wt 225.0 lb

## 2018-06-21 DIAGNOSIS — L03011 Cellulitis of right finger: Secondary | ICD-10-CM

## 2018-06-21 MED ORDER — CLINDAMYCIN HCL 300 MG PO CAPS: 300.0000 mg | ORAL_CAPSULE | Freq: Four times a day (QID) | ORAL | 0 refills | Status: DC

## 2018-06-21 NOTE — Progress Notes (Signed)
Subjective:    Patient ID: Matthew Luna, male    DOB: December 30, 1940, 78 y.o.   MRN: [id number]  06/18/18 Patient presents today with a swollen red painful right fourth digit.  Symptoms began about 1 week ago.  He "smashed" the tip of his finger with a pry bar while working on a motor.  Initially there was no pain.  There was no redness.  About 3 days ago, the pain became moderate in intensity.  The finger distal to the DIP joint became red swollen and tender.  Patient has had pus drained from underneath the proximal nail fold.  He has poked it several times with a sterilized needle to try to release the pus.  Finger began to throb yesterday prompting him to make this appointment.  The distal portion of his right fourth digit from roughly the level of the DIP joint all the way down to the tip of the finger is red circumferentially.  There is significant swelling in the proximal nail fold.  There is a trace amount of pus visible underneath the proximal nail fold.  The finger is throbbing and tender to touch.  At that time, my plan was: I anesthetized the right fourth finger using a digital block with 0.1% lidocaine with out epinephrine.  A tourniquet was applied at the base of the finger.  Using a scalpel, I inserted the tip of the scalpel underneath the proximal nail fold and released blood and pus which was cultured.  I then probed and cleaned the area thoroughly with Q-tips using hydrogen peroxide.  No residual pus was expressed from the wound.  Begin Bactrim double strength tablets 1 p.o. twice daily immediately.  Recheck in 48 hours.  The wound was covered with Neosporin wrapped with petroleum gauze and then wrapped with coban.  Tourniquet was removed.  Total tourniquet time was less than 2 minutes.  Estimated blood loss was 5 mL  06/20/18 Patient is here today for follow-up.  Redness has subsided and faded since his last visit although still present.  Patient states his finger is still throbbing at night  although better.  There is no further purulent drainage coming from under the proximal nail fold.  I undressed his finger today.  The erythema is less.  Palpation of the proximal nail fold does not express any additional pus.  However the finger is still quite tender.  At that time, my plan was: Erythema has faded although still present but the wound seems to be improving and the cellulitis seems to be improving.  There is no additional pus leaking from under the proximal nail fold.  I coated the tip of his finger and Silvadene.  I wrapped it with petroleum gauze and then wrapped it gently with coban.  We discussed wound care and how to perform wound care daily.  Finish his antibiotic.  Recheck next week if not substantially better or sooner if getting worse.  06/21/18 Patient appears today because he is concerned that the finger looks worse.  Erythema has spread slightly proximally.  Previously it was 2.5 cm in length.  Currently is 3 cm in length.  There is also purulent material draining from underneath the proximal nail fold again.  Culture results so far have only revealed gram-positive cocci in pairs.  Patient states that he has been trying to clean the pus out from underneath the nail fold by sterilizing a knife and using that. Past Medical History:  Diagnosis Date  . AAA (abdominal  aortic aneurysm) ([place])   . Actinic keratosis   . Allergy    mild   . Anxiety   . CKD (chronic kidney disease) stage 3, GFR 30-59 ml/min (HCC)   . Diverticulosis of colon (without mention of hemorrhage)   . Duodenitis without mention of hemorrhage   . Esophageal reflux   . Hypertrophy of prostate without urinary obstruction and other lower urinary tract symptoms (LUTS)   . Left sided ulcerative (chronic) colitis ([place])   . Mononeuritis of unspecified site   . Neuropathy   . Nonspecific abnormal results of thyroid function study   . Prostate cancer ([place])    cryotherapy 2011 [facility])  . Schatzki's ring   . Sleep  apnea    ahi 75, cpap 14  . Stricture and stenosis of esophagus   . TIA (transient ischemic attack) 2017   pt states was told mini stroke  . Unspecified essential hypertension    Past Surgical History:  Procedure Laterality Date  . ABDOMINAL AORTIC ANEURYSM REPAIR     2018  . BALLOON DILATION  03/22/2011   Procedure: BALLOON DILATION;  Surgeon: Inda Castle, MD;  Location: [facility];  Service: Endoscopy;  Laterality: N/A;  kelly/ebp  . CHOLECYSTECTOMY    . COLONOSCOPY    . ENDOVASCULAR REPAIR/STENT GRAFT N/A 03/29/2016   Procedure: Endovascular Repair/Stent Graft;  Surgeon: Algernon Huxley, MD;  Location: [facility];  Service: Cardiovascular;  Laterality: N/A;  . ESOPHAGOGASTRODUODENOSCOPY  03/22/2011   Procedure: ESOPHAGOGASTRODUODENOSCOPY (EGD);  Surgeon: Inda Castle, MD;  Location: [facility];  Service: Endoscopy;  Laterality: N/A;  . PROSTATE SURGERY    . UPPER GASTROINTESTINAL ENDOSCOPY     Current Outpatient Medications on File Prior to Visit  Medication Sig Dispense Refill  . aspirin EC 81 MG tablet Take 1 tablet (81 mg total) by mouth daily. 30 tablet 11  . citalopram (CELEXA) 40 MG tablet TAKE 1 TABLET BY MOUTH EVERY DAY 90 tablet 3  . clobetasol (TEMOVATE) 0.05 % external solution Apply 1 application topically 2 (two) times daily. 50 mL 5  . gabapentin (NEURONTIN) 300 MG capsule TAKE 1 CAPSULE (300 MG TOTAL) BY MOUTH AT BEDTIME. FOR NEUROPATHY 90 capsule 2  . HYDROcodone-acetaminophen (NORCO) 5-325 MG tablet Take 1 tablet by mouth every 6 (six) hours as needed for moderate pain. 10 tablet 0  . imipramine (TOFRANIL) 25 MG tablet Take 25 mg by mouth at bedtime.    . mirabegron ER (MYRBETRIQ) 50 MG TB24 tablet Take 1 tablet (50 mg total) by mouth daily. (Patient taking differently: Take 25 mg by mouth daily. ) 30 tablet 5  . nebivolol (BYSTOLIC) 10 MG tablet Take 0.5 tablets (5 mg total) by mouth at bedtime. 90 tablet 1  . sulfamethoxazole-trimethoprim (BACTRIM  DS) 800-160 MG tablet Take 1 tablet by mouth 2 (two) times daily. 14 tablet 0   No current facility-administered medications on file prior to visit.    Allergies  Allergen Reactions  . Metronidazole Anaphylaxis  . Amoxicillin-Pot Clavulanate Other (See Comments)    UNKNOWN  . Ciprofloxacin Other (See Comments)    UNKNOWN  . Mesalamine     REACTION: Intolerance   Social History   Socioeconomic History  . Marital status: Married    Spouse name: Not on file  . Number of children: 1  . Years of education: Some colg  . Highest education level: Not on file  Occupational History  . Occupation: Retired   Scientific laboratory technician  .  Financial resource strain: Not on file  . Food insecurity:    Worry: Not on file    Inability: Not on file  . Transportation needs:    Medical: Not on file    Non-medical: Not on file  Tobacco Use  . Smoking status: Never Smoker  . Smokeless tobacco: Never Used  Substance and Sexual Activity  . Alcohol use: Yes    Alcohol/week: 7.0 standard drinks    Types: 7 Glasses of wine per week  . Drug use: No  . Sexual activity: Never  Lifestyle  . Physical activity:    Days per week: Not on file    Minutes per session: Not on file  . Stress: Not on file  Relationships  . Social connections:    Talks on phone: Not on file    Gets together: Not on file    Attends religious service: Not on file    Active member of club or organization: Not on file    Attends meetings of clubs or organizations: Not on file    Relationship status: Not on file  . Intimate partner violence:    Fear of current or ex partner: Not on file    Emotionally abused: Not on file    Physically abused: Not on file    Forced sexual activity: Not on file  Other Topics Concern  . Not on file  Social History Narrative   1 -2 cups of coffee a day, some tea consumption       Review of Systems  All other systems reviewed and are negative.      Objective:   Physical Exam   Constitutional: He appears well-developed and well-nourished. No distress.  Cardiovascular: Normal rate, regular rhythm and normal heart sounds. Exam reveals no gallop and no friction rub.  No murmur heard. Pulmonary/Chest: Effort normal and breath sounds normal. No respiratory distress. He has no wheezes. He has no rales.  Abdominal: Soft. Bowel sounds are normal.  Musculoskeletal:     Right hand: He exhibits tenderness, deformity and swelling.       Hands:  Neurological: He has normal strength.    Skin: He is not diaphoretic.  Vitals reviewed.  Finger appears worse today.  There is erythema from the DIP joint all the way to the tip of the finger.  However now the length is approximately 3 cm.  There is yellow purulent material draining from underneath the proximal nail fold.  Patient states that the finger is not painful.  There is also some swelling of the finger pad.  This is also slightly erythematous.         Assessment & Plan:  Cellulitis of finger of right hand  Paronychia of finger, right  I discussed with the patient that I am concerned about possible underlying osteomyelitis.  I explained that the only way to determine this is to get an MRI by going to the emergency room at this time as I am not able to schedule this as an outpatient procedure on Friday evening.  Patient declines going to the emergency room.  I am concerned about possible MRSA despite the culture results.  Therefore I will add clindamycin 300 mg p.o. 4 times daily.  I also anesthetized the finger with a digital block again.  Using a metal blade , I drained additional purulent material from underneath the proximal nail fold.  I was able to insert a Q-tip soaked in hydrogen peroxide under the proximal nail fold to clean  out the cavity of the purulent material.  A repeat wound culture was sent as well of this material I also made a vertical incision on the finger pad itself to inspect for possible felon given the  swelling and worsening physical appearance.  No pus was expressed from the finger pad suggesting against a felon.  Therefore I am concerned that if the condition worsens the patient may have osteomyelitis.  We discussed at length.  If the finger worsens, he is to go to the emergency room over the weekend.  We discussed the risk of osteomyelitis.  He will try clindamycin.  He will discontinue probing the wound with a pocket knife or needle on his own as I am concerned that this could be contaminating the wound and propagating the infection.  Patient will undress the finger in the morning.  He will clean it with soapy water dry it thoroughly and then cover it with Polysporin and wrap it with a Band-Aid.  He is to continue this daily and come back in early next week for reassessment.  If worsening he needs to go directly to the emergency room for possible osteomyelitis.  Patient understands and agrees with the plan

## 2018-06-22 LAB — WOUND CULTURE
MICRO NUMBER:: 491800
SPECIMEN QUALITY:: ADEQUATE

## 2018-06-25 ENCOUNTER — Other Ambulatory Visit: Payer: Self-pay

## 2018-06-25 ENCOUNTER — Ambulatory Visit (INDEPENDENT_AMBULATORY_CARE_PROVIDER_SITE_OTHER): Payer: PPO | Admitting: Family Medicine

## 2018-06-25 ENCOUNTER — Encounter: Payer: Self-pay | Admitting: Family Medicine

## 2018-06-25 VITALS — BP 128/76 | HR 62 | Temp 97.9°F | Resp 18 | Ht 74.0 in | Wt 221.0 lb

## 2018-06-25 DIAGNOSIS — L03011 Cellulitis of right finger: Secondary | ICD-10-CM

## 2018-06-25 MED ORDER — ONDANSETRON HCL 4 MG PO TABS: 4.0000 mg | ORAL_TABLET | Freq: Three times a day (TID) | ORAL | 0 refills | Status: DC | PRN

## 2018-06-25 MED ORDER — DOXYCYCLINE HYCLATE 100 MG PO TABS: 100.0000 mg | ORAL_TABLET | Freq: Two times a day (BID) | ORAL | 0 refills | Status: DC

## 2018-06-25 NOTE — Addendum Note (Signed)
Addended by: Shary Decamp B on: 06/25/2018 09:33 AM   Modules accepted: Orders

## 2018-06-25 NOTE — Progress Notes (Signed)
Subjective:    Patient ID: Matthew Luna, male    DOB: [birth year], 78 y.o.   MRN: [id number]  06/18/18 Patient presents today with a swollen red painful right fourth digit.  Symptoms began about 1 week ago.  He "smashed" the tip of his finger with a pry bar while working on a motor.  Initially there was no pain.  There was no redness.  About 3 days ago, the pain became moderate in intensity.  The finger distal to the DIP joint became red swollen and tender.  Patient has had pus drained from underneath the proximal nail fold.  He has poked it several times with a sterilized needle to try to release the pus.  Finger began to throb yesterday prompting him to make this appointment.  The distal portion of his right fourth digit from roughly the level of the DIP joint all the way down to the tip of the finger is red circumferentially.  There is significant swelling in the proximal nail fold.  There is a trace amount of pus visible underneath the proximal nail fold.  The finger is throbbing and tender to touch.  At that time, my plan was: I anesthetized the right fourth finger using a digital block with 0.1% lidocaine with out epinephrine.  A tourniquet was applied at the base of the finger.  Using a scalpel, I inserted the tip of the scalpel underneath the proximal nail fold and released blood and pus which was cultured.  I then probed and cleaned the area thoroughly with Q-tips using hydrogen peroxide.  No residual pus was expressed from the wound.  Begin Bactrim double strength tablets 1 p.o. twice daily immediately.  Recheck in 48 hours.  The wound was covered with Neosporin wrapped with petroleum gauze and then wrapped with coban.  Tourniquet was removed.  Total tourniquet time was less than 2 minutes.  Estimated blood loss was 5 mL  06/20/18 Patient is here today for follow-up.  Redness has subsided and faded since his last visit although still present.  Patient states his finger is still throbbing at night  although better.  There is no further purulent drainage coming from under the proximal nail fold.  I undressed his finger today.  The erythema is less.  Palpation of the proximal nail fold does not express any additional pus.  However the finger is still quite tender.  At that time, my plan was: Erythema has faded although still present but the wound seems to be improving and the cellulitis seems to be improving.  There is no additional pus leaking from under the proximal nail fold.  I coated the tip of his finger and Silvadene.  I wrapped it with petroleum gauze and then wrapped it gently with coban.  We discussed wound care and how to perform wound care daily.  Finish his antibiotic.  Recheck next week if not substantially better or sooner if getting worse.  06/21/18 Patient appears today because he is concerned that the finger looks worse.  Erythema has spread slightly proximally.  Previously it was 2.5 cm in length.  Currently is 3 cm in length.  There is also purulent material draining from underneath the proximal nail fold again.  Culture results so far have only revealed gram-positive cocci in pairs.  Patient states that he has been trying to clean the pus out from underneath the nail fold by sterilizing a knife and using that.  At that time my plan was: I discussed with  the patient that I am concerned about possible underlying osteomyelitis.  I explained that the only way to determine this is to get an MRI by going to the emergency room at this time as I am not able to schedule this as an outpatient procedure on Friday evening.  Patient declines going to the emergency room.  I am concerned about possible MRSA despite the culture results.  Therefore I will add clindamycin 300 mg p.o. 4 times daily.  I also anesthetized the finger with a digital block again.  Using a metal blade , I drained additional purulent material from underneath the proximal nail fold.  I was able to insert a Q-tip soaked in hydrogen  peroxide under the proximal nail fold to clean out the cavity of the purulent material.  A repeat wound culture was sent as well of this material I also made a vertical incision on the finger pad itself to inspect for possible felon given the swelling and worsening physical appearance.  No pus was expressed from the finger pad suggesting against a felon.  Therefore I am concerned that if the condition worsens the patient may have osteomyelitis.  We discussed at length.  If the finger worsens, he is to go to the emergency room over the weekend.  We discussed the risk of osteomyelitis.  He will try clindamycin.  He will discontinue probing the wound with a pocket knife or needle on his own as I am concerned that this could be contaminating the wound and propagating the infection.  Patient will undress the finger in the morning.  He will clean it with soapy water dry it thoroughly and then cover it with Polysporin and wrap it with a Band-Aid.  He is to continue this daily and come back in early next week for reassessment.  If worsening he needs to go directly to the emergency room for possible osteomyelitis.  Patient understands and agrees with the plan  06/25/18 Patient took 3 doses of clindamycin on Friday and developed severe nausea and vomiting.  He stopped the clindamycin after Saturday morning and has not had any additional antibiotics since Saturday.  However the pain in his finger has subsided dramatically.  The erythema has faded and the swelling has faded.  No more pus is coming from underneath his proximal nail fold.  Culture returned showing staph aureus sensitive to doxycycline, Bactrim, and clindamycin  Past Medical History:  Diagnosis Date   AAA (abdominal aortic aneurysm) (HCC)    Actinic keratosis    Allergy    mild    Anxiety    CKD (chronic kidney disease) stage 3, GFR 30-59 ml/min (HCC)    Diverticulosis of colon (without mention of hemorrhage)    Duodenitis without mention of  hemorrhage    Esophageal reflux    Hypertrophy of prostate without urinary obstruction and other lower urinary tract symptoms (LUTS)    Left sided ulcerative (chronic) colitis (HCC)    Mononeuritis of unspecified site    Neuropathy    Nonspecific abnormal results of thyroid function study    Prostate cancer ([place])    cryotherapy 2011 ([facility])   Schatzki's ring    Sleep apnea    ahi 75, cpap 14   Stricture and stenosis of esophagus    TIA (transient ischemic attack) 2017   pt states was told mini stroke   Unspecified essential hypertension    Past Surgical History:  Procedure Laterality Date   ABDOMINAL AORTIC ANEURYSM REPAIR  2018   BALLOON DILATION  03/22/2011   Procedure: BALLOON DILATION;  Surgeon: Inda Castle, MD;  Location: [facility];  Service: Endoscopy;  Laterality: N/A;  kelly/ebp   CHOLECYSTECTOMY     COLONOSCOPY     ENDOVASCULAR REPAIR/STENT GRAFT N/A 03/29/2016   Procedure: Endovascular Repair/Stent Graft;  Surgeon: Algernon Huxley, MD;  Location: [facility];  Service: Cardiovascular;  Laterality: N/A;   ESOPHAGOGASTRODUODENOSCOPY  03/22/2011   Procedure: ESOPHAGOGASTRODUODENOSCOPY (EGD);  Surgeon: Inda Castle, MD;  Location: [facility];  Service: Endoscopy;  Laterality: N/A;   PROSTATE SURGERY     UPPER GASTROINTESTINAL ENDOSCOPY     Current Outpatient Medications on File Prior to Visit  Medication Sig Dispense Refill   aspirin EC 81 MG tablet Take 1 tablet (81 mg total) by mouth daily. 30 tablet 11   citalopram (CELEXA) 40 MG tablet TAKE 1 TABLET BY MOUTH EVERY DAY 90 tablet 3   clobetasol (TEMOVATE) 0.05 % external solution Apply 1 application topically 2 (two) times daily. 50 mL 5   gabapentin (NEURONTIN) 300 MG capsule TAKE 1 CAPSULE (300 MG TOTAL) BY MOUTH AT BEDTIME. FOR NEUROPATHY 90 capsule 2   HYDROcodone-acetaminophen (NORCO) 5-325 MG tablet Take 1 tablet by mouth every 6 (six) hours as needed for moderate pain. 10  tablet 0   imipramine (TOFRANIL) 25 MG tablet Take 25 mg by mouth at bedtime.     mirabegron ER (MYRBETRIQ) 50 MG TB24 tablet Take 1 tablet (50 mg total) by mouth daily. (Patient taking differently: Take 25 mg by mouth daily. ) 30 tablet 5   nebivolol (BYSTOLIC) 10 MG tablet Take 0.5 tablets (5 mg total) by mouth at bedtime. 90 tablet 1   sulfamethoxazole-trimethoprim (BACTRIM DS) 800-160 MG tablet Take 1 tablet by mouth 2 (two) times daily. 14 tablet 0   clindamycin (CLEOCIN) 300 MG capsule Take 1 capsule (300 mg total) by mouth 4 (four) times daily. (Patient not taking: Reported on 06/25/2018) 40 capsule 0   No current facility-administered medications on file prior to visit.    Allergies  Allergen Reactions   Metronidazole Anaphylaxis   Amoxicillin-Pot Clavulanate Other (See Comments)    UNKNOWN   Ciprofloxacin Other (See Comments)    UNKNOWN   Mesalamine     REACTION: Intolerance   Social History   Socioeconomic History   Marital status: Married    Spouse name: Not on file   Number of children: 1   Years of education: Some colg   Highest education level: Not on file  Occupational History   Occupation: Retired   Scientist, product/process development strain: Not on file   Food insecurity:    Worry: Not on file    Inability: Not on file   Transportation needs:    Medical: Not on file    Non-medical: Not on file  Tobacco Use   Smoking status: Never Smoker   Smokeless tobacco: Never Used  Substance and Sexual Activity   Alcohol use: Yes    Alcohol/week: 7.0 standard drinks    Types: 7 Glasses of wine per week   Drug use: No   Sexual activity: Never  Lifestyle   Physical activity:    Days per week: Not on file    Minutes per session: Not on file   Stress: Not on file  Relationships   Social connections:    Talks on phone: Not on file    Gets together: Not on file    Attends  religious service: Not on file    Active member of club or  organization: Not on file    Attends meetings of clubs or organizations: Not on file    Relationship status: Not on file   Intimate partner violence:    Fear of current or ex partner: Not on file    Emotionally abused: Not on file    Physically abused: Not on file    Forced sexual activity: Not on file  Other Topics Concern   Not on file  Social History Narrative   1 -2 cups of coffee a day, some tea consumption       Review of Systems  All other systems reviewed and are negative.      Objective:   Physical Exam  Constitutional: He appears well-developed and well-nourished. No distress.  Cardiovascular: Normal rate, regular rhythm and normal heart sounds. Exam reveals no gallop and no friction rub.  No murmur heard. Pulmonary/Chest: Effort normal and breath sounds normal. No respiratory distress. He has no wheezes. He has no rales.  Abdominal: Soft. Bowel sounds are normal.  Musculoskeletal:     Right hand: He exhibits tenderness, deformity and swelling.       Hands:  Neurological: He has normal strength.  Skin: He is not diaphoretic.  Vitals reviewed.  Erythema has improved today.  It is faded substantially compared to Friday's visit.  The pain has also improved as has the swelling.  There is no residual pus able to be expressed from underneath the proximal nail fold with pressure.  Patient feels much better today       Assessment & Plan:  Cellulitis of finger of right hand  Paronychia of finger, right  The infection has improved after only taking 24 hours of clindamycin.  Although it is better I have recommended that he complete antibiotic therapy therefore I will switch the patient to doxycycline 100 mg twice daily for 10 days to complete treatment for this infection.  Reassess later this week if no better or sooner if worse however clinically it appears much better today

## 2018-06-28 ENCOUNTER — Encounter (INDEPENDENT_AMBULATORY_CARE_PROVIDER_SITE_OTHER): Payer: Self-pay | Admitting: Vascular Surgery

## 2018-06-28 ENCOUNTER — Other Ambulatory Visit: Payer: Self-pay

## 2018-06-28 ENCOUNTER — Ambulatory Visit (INDEPENDENT_AMBULATORY_CARE_PROVIDER_SITE_OTHER): Payer: PPO | Admitting: Vascular Surgery

## 2018-06-28 ENCOUNTER — Ambulatory Visit (INDEPENDENT_AMBULATORY_CARE_PROVIDER_SITE_OTHER): Payer: PPO

## 2018-06-28 VITALS — BP 150/96 | HR 71 | Resp 16 | Ht 75.0 in | Wt 219.0 lb

## 2018-06-28 DIAGNOSIS — N200 Calculus of kidney: Secondary | ICD-10-CM

## 2018-06-28 DIAGNOSIS — Z79899 Other long term (current) drug therapy: Secondary | ICD-10-CM | POA: Diagnosis not present

## 2018-06-28 DIAGNOSIS — I714 Abdominal aortic aneurysm, without rupture, unspecified: Secondary | ICD-10-CM

## 2018-06-28 DIAGNOSIS — I10 Essential (primary) hypertension: Secondary | ICD-10-CM | POA: Diagnosis not present

## 2018-06-28 LAB — WOUND CULTURE
MICRO NUMBER:: 505218
SPECIMEN QUALITY:: ADEQUATE

## 2018-06-28 NOTE — Assessment & Plan Note (Signed)
He was not n.p.o. this morning so visualization was not ideal, but no endoleak was seen on his duplex.  There was some slight increase in size by duplex, but again imaging was difficult and visualization was limited today. We will continue follow-up duplex studies to evaluate the aneurysm.  No changes to his medical regimen at this time.

## 2018-06-28 NOTE — Progress Notes (Signed)
MRN : [id number]  Matthew Luna is a 78 y.o. ([birth year]) male who presents with chief complaint of  Chief Complaint  Patient presents with  . Follow-up    ultrasound follow up  .  History of Present Illness: Patient returns today in follow up of his abdominal aortic aneurysm.  He is doing well with no complaints.  No aneurysm related symptoms.  He is a little over 2 years status post endovascular repair.  He was not n.p.o. this morning so visualization was not ideal, but no endoleak was seen on his duplex.  There was some slight increase in size by duplex, but again imaging was difficult and visualization was limited today.  Current Outpatient Medications  Medication Sig Dispense Refill  . aspirin EC 81 MG tablet Take 1 tablet (81 mg total) by mouth daily. 30 tablet 11  . clobetasol (TEMOVATE) 0.05 % external solution Apply 1 application topically 2 (two) times daily. 50 mL 5  . doxycycline (VIBRA-TABS) 100 MG tablet Take 1 tablet (100 mg total) by mouth 2 (two) times daily. 20 tablet 0  . imipramine (TOFRANIL) 25 MG tablet Take 25 mg by mouth at bedtime.    . mirabegron ER (MYRBETRIQ) 50 MG TB24 tablet Take 1 tablet (50 mg total) by mouth daily. (Patient taking differently: Take 25 mg by mouth daily. ) 30 tablet 5  . nebivolol (BYSTOLIC) 10 MG tablet Take 0.5 tablets (5 mg total) by mouth at bedtime. 90 tablet 1  . ondansetron (ZOFRAN) 4 MG tablet Take 1 tablet (4 mg total) by mouth every 8 (eight) hours as needed for nausea or vomiting. 20 tablet 0  . citalopram (CELEXA) 40 MG tablet TAKE 1 TABLET BY MOUTH EVERY DAY (Patient not taking: Reported on 06/28/2018) 90 tablet 3  . gabapentin (NEURONTIN) 300 MG capsule TAKE 1 CAPSULE (300 MG TOTAL) BY MOUTH AT BEDTIME. FOR NEUROPATHY (Patient not taking: Reported on 06/28/2018) 90 capsule 2  . HYDROcodone-acetaminophen (NORCO) 5-325 MG tablet Take 1 tablet by mouth every 6 (six) hours as needed for moderate pain. (Patient not taking: Reported on  06/28/2018) 10 tablet 0  . sulfamethoxazole-trimethoprim (BACTRIM DS) 800-160 MG tablet Take 1 tablet by mouth 2 (two) times daily. (Patient not taking: Reported on 06/28/2018) 14 tablet 0   No current facility-administered medications for this visit.     Past Medical History:  Diagnosis Date  . AAA (abdominal aortic aneurysm) ([place])   . Actinic keratosis   . Allergy    mild   . Anxiety   . CKD (chronic kidney disease) stage 3, GFR 30-59 ml/min (HCC)   . Diverticulosis of colon (without mention of hemorrhage)   . Duodenitis without mention of hemorrhage   . Esophageal reflux   . Hypertrophy of prostate without urinary obstruction and other lower urinary tract symptoms (LUTS)   . Left sided ulcerative (chronic) colitis ([place])   . Mononeuritis of unspecified site   . Neuropathy   . Nonspecific abnormal results of thyroid function study   . Prostate cancer ([place])    cryotherapy 2011 [facility])  . Schatzki's ring   . Sleep apnea    ahi 75, cpap 14  . Stricture and stenosis of esophagus   . TIA (transient ischemic attack) 2017   pt states was told mini stroke  . Unspecified essential hypertension     Past Surgical History:  Procedure Laterality Date  . ABDOMINAL AORTIC ANEURYSM REPAIR     2018  . BALLOON DILATION  03/22/2011   Procedure: BALLOON DILATION;  Surgeon: Inda Castle, MD;  Location: [facility];  Service: Endoscopy;  Laterality: N/A;  kelly/ebp  . CHOLECYSTECTOMY    . COLONOSCOPY    . ENDOVASCULAR REPAIR/STENT GRAFT N/A 03/29/2016   Procedure: Endovascular Repair/Stent Graft;  Surgeon: Algernon Huxley, MD;  Location: [facility];  Service: Cardiovascular;  Laterality: N/A;  . ESOPHAGOGASTRODUODENOSCOPY  03/22/2011   Procedure: ESOPHAGOGASTRODUODENOSCOPY (EGD);  Surgeon: Inda Castle, MD;  Location: [facility];  Service: Endoscopy;  Laterality: N/A;  . PROSTATE SURGERY    . UPPER GASTROINTESTINAL ENDOSCOPY      Social History        Tobacco Use  .  Smoking status: Never Smoker  . Smokeless tobacco: Never Used  Substance Use Topics  . Alcohol use: Yes    Alcohol/week: 4.2 oz    Types: 7 Glasses of wine per week  . Drug use: No    Family History      Family History  Problem Relation Age of Onset  . Cancer Mother   . Snoring Father   . Cancer Unknown   . Anesthesia problems Neg Hx   . Hypotension Neg Hx   . Malignant hyperthermia Neg Hx   . Pseudochol deficiency Neg Hx   . Colon cancer Neg Hx   . Colon polyps Neg Hx   . Esophageal cancer Neg Hx   . Stomach cancer Neg Hx   . Rectal cancer Neg Hx          Allergies  Allergen Reactions  . Metronidazole Anaphylaxis  . Amoxicillin-Pot Clavulanate Other (See Comments)    UNKNOWN  . Ciprofloxacin Other (See Comments)    UNKNOWN  . Mesalamine     REACTION: Intolerance     REVIEW OF SYSTEMS(Negative unless checked)  Constitutional: [] ?Weight loss[] ?Fever[] ?Chills Cardiac:[] ?Chest pain[] ?Chest pressure[] ?Palpitations [] ?Shortness of breath when laying flat [] ?Shortness of breath at rest [] ?Shortness of breath with exertion. Vascular: [] ?Pain in legs with walking[] ?Pain in legsat rest[] ?Pain in legs when laying flat [] ?Claudication [] ?Pain in feet when walking [] ?Pain in feet at rest [] ?Pain in feet when laying flat [] ?History of DVT [] ?Phlebitis [x] ?Swelling in legs [] ?Varicose veins [] ?Non-healing ulcers Pulmonary: [] ?Uses home oxygen [] ?Productive cough[] ?Hemoptysis [] ?Wheeze [] ?COPD [] ?Asthma Neurologic: [] ?Dizziness [] ?Blackouts [] ?Seizures [] ?History of stroke [] ?History of TIA[] ?Aphasia [] ?Temporary blindness[] ?Dysphagia [] ?Weaknessor numbness in arms [] ?Weakness or numbnessin legs Musculoskeletal: [x] ?Arthritis [] ?Joint swelling [] ?Joint pain [x] ?Low back pain Hematologic:[] ?Easy bruising[] ?Easy bleeding [] ?Hypercoagulable state [] ?Anemic   Gastrointestinal:[] ?Blood in stool[] ?Vomiting blood[] ?Gastroesophageal reflux/heartburn[] ?Abdominal pain Genitourinary: [x] ?Chronic kidney disease [] ?Difficulturination [] ?Frequenturination [] ?Burning with urination[x] ?Hematuria Skin: [] ?Rashes [] ?Ulcers [] ?Wounds Psychological: [] ?History of anxiety[] ?History of major depression.    Physical Examination  BP (!) 150/96 (BP Location: Right Arm)   Pulse 71   Resp 16   Ht 6\' 3"  (1.905 m)   Wt 219 lb (99.3 kg)   BMI 27.37 kg/m  Gen:  WD/WN, NAD Head: [place]/AT, No temporalis wasting. Ear/Nose/Throat: Hearing diminished, nares w/o erythema or drainage Eyes: Conjunctiva clear. Sclera non-icteric Neck: Supple.  Trachea midline Pulmonary:  Good air movement, no use of accessory muscles.  Cardiac: RRR, no JVD Vascular:  Vessel Right Left  Radial Palpable Palpable                                   Gastrointestinal: soft, non-tender/non-distended. No increased aortic impulse Musculoskeletal: M/S 5/5 throughout.  No deformity or atrophy. No edema. Neurologic: Sensation grossly intact in extremities.  Symmetrical.  Speech is fluent.  Psychiatric: Judgment intact, Mood & affect appropriate for pt's clinical situation. Dermatologic: No rashes or ulcers noted.  No cellulitis or open wounds.       Labs Recent Results (from the past 2160 hour(s))  WOUND CULTURE     Status: Abnormal   Collection Time: 06/18/18  3:40 PM  Result Value Ref Range   MICRO NUMBER: [id number]    SPECIMEN QUALITY: Adequate    SOURCE: 1ST FINGER ON RIGHT HAND    STATUS: FINAL    GRAM STAIN:      No white blood cells seen No epithelial cells seen Many Gram positive cocci in pairs   ISOLATE 1: Staphylococcus aureus (A)     Comment: Heavy growth of Staphylococcus aureus Negative for inducible clindamycin resistance.      Susceptibility   Staphylococcus aureus - AEROBIC CULT, GRAM STAIN POSITIVE 1    VANCOMYCIN 1 Sensitive      CIPROFLOXACIN <=0.5 Sensitive     CLINDAMYCIN <=0.25 Sensitive     LEVOFLOXACIN 0.25 Sensitive     ERYTHROMYCIN >=8 Resistant     GENTAMICIN <=0.5 Sensitive     OXACILLIN* 0.5 Sensitive      * Oxacillin-susceptible staphylococci aresusceptible to other penicillinase-stablepenicillins (e.g. Methicillin, Nafcillin), beta-lactam/beta-lactamase inhibitor combinations, andcephems with staphylococcal indications, includingCefazolin.    TETRACYCLINE <=1 Sensitive     TRIMETH/SULFA* <=10 Sensitive      * Oxacillin-susceptible staphylococci aresusceptible to other penicillinase-stablepenicillins (e.g. Methicillin, Nafcillin), beta-lactam/beta-lactamase inhibitor combinations, andcephems with staphylococcal indications, includingCefazolin.Legend:S = Susceptible  I = IntermediateR = Resistant  NS = Not susceptible* = Not tested  NR = Not reported**NN = See antimicrobic comments  WOUND CULTURE     Status: Abnormal   Collection Time: 06/21/18  3:40 PM  Result Value Ref Range   MICRO NUMBER: [id number]    SPECIMEN QUALITY: Adequate    SOURCE: FINGER    STATUS: FINAL    GRAM STAIN:      No white blood cells seen No epithelial cells seen No organisms seen   ISOLATE 1: Staphylococcus aureus (A)     Comment: Moderate growth of Staphylococcus aureus Negative for inducible clindamycin resistance.      Susceptibility   Staphylococcus aureus - AEROBIC CULT, GRAM STAIN POSITIVE 1    VANCOMYCIN 1 Sensitive     CIPROFLOXACIN <=0.5 Sensitive     CLINDAMYCIN <=0.25 Sensitive     LEVOFLOXACIN 0.25 Sensitive     ERYTHROMYCIN >=8 Resistant     GENTAMICIN <=0.5 Sensitive     OXACILLIN* 0.5 Sensitive      * Oxacillin-susceptible staphylococci aresusceptible to other penicillinase-stablepenicillins (e.g. Methicillin, Nafcillin), beta-lactam/beta-lactamase inhibitor combinations, andcephems with staphylococcal indications, includingCefazolin.    TETRACYCLINE <=1 Sensitive     TRIMETH/SULFA* <=10 Sensitive      *  Oxacillin-susceptible staphylococci aresusceptible to other penicillinase-stablepenicillins (e.g. Methicillin, Nafcillin), beta-lactam/beta-lactamase inhibitor combinations, andcephems with staphylococcal indications, includingCefazolin.Legend:S = Susceptible  I = IntermediateR = Resistant  NS = Not susceptible* = Not tested  NR = Not reported**NN = See antimicrobic comments    Radiology No results found.  Assessment/Plan Essential hypertension blood pressure control important in reducing the progression of atherosclerotic disease. On appropriate oral medications.   Calculus of kidney Follows with urology for this  AAA (abdominal aortic aneurysm) without rupture ([place]) He was not n.p.o. this morning so visualization was not ideal, but no endoleak was seen on his duplex.  There was some slight increase in size by duplex, but again imaging was difficult  and visualization was limited today. We will continue follow-up duplex studies to evaluate the aneurysm.  No changes to his medical regimen at this time.    Leotis Pain, MD  06/28/2018 9:57 AM    This note was created with Dragon medical transcription system.  Any errors from dictation are purely unintentional

## 2018-08-12 ENCOUNTER — Other Ambulatory Visit: Payer: Self-pay | Admitting: Family Medicine

## 2018-08-14 ENCOUNTER — Telehealth: Payer: Self-pay | Admitting: Family Medicine

## 2018-08-14 NOTE — Telephone Encounter (Signed)
Pt has ?'s about a med, he left a vm up front after we closed yesterday. Did not state which med. Please call him back when you can.

## 2018-08-14 NOTE — Telephone Encounter (Signed)
LM with wife to return call 

## 2018-08-16 NOTE — Telephone Encounter (Signed)
LMTRC

## 2018-08-16 NOTE — Telephone Encounter (Signed)
Spoke to pt and he needed an apt to adjust his medication and a referral to Dr. Sydell Axon in [place] to have "throat stretched again".

## 2018-08-19 ENCOUNTER — Ambulatory Visit (INDEPENDENT_AMBULATORY_CARE_PROVIDER_SITE_OTHER): Payer: PPO | Admitting: Family Medicine

## 2018-08-19 ENCOUNTER — Other Ambulatory Visit: Payer: Self-pay

## 2018-08-19 ENCOUNTER — Other Ambulatory Visit: Payer: Self-pay | Admitting: Family Medicine

## 2018-08-19 ENCOUNTER — Encounter: Payer: Self-pay | Admitting: Family Medicine

## 2018-08-19 VITALS — BP 142/86 | HR 70 | Temp 97.8°F | Resp 16 | Ht 74.0 in | Wt 220.0 lb

## 2018-08-19 DIAGNOSIS — R1319 Other dysphagia: Secondary | ICD-10-CM

## 2018-08-19 DIAGNOSIS — R1310 Dysphagia, unspecified: Secondary | ICD-10-CM

## 2018-08-19 MED ORDER — OMEPRAZOLE 40 MG PO CPDR: 40.0000 mg | DELAYED_RELEASE_CAPSULE | Freq: Every day | ORAL | 3 refills | Status: DC

## 2018-08-19 MED ORDER — ESCITALOPRAM OXALATE 10 MG PO TABS: 10.0000 mg | ORAL_TABLET | Freq: Every day | ORAL | 5 refills | Status: DC

## 2018-08-19 NOTE — Progress Notes (Signed)
Subjective:    Patient ID: Matthew Luna, male    DOB: May 05, 1940, 78 y.o.   MRN: [id number]  06/18/18 Patient presents today with a swollen red painful right fourth digit.  Symptoms began about 1 week ago.  He "smashed" the tip of his finger with a pry bar while working on a motor.  Initially there was no pain.  There was no redness.  About 3 days ago, the pain became moderate in intensity.  The finger distal to the DIP joint became red swollen and tender.  Patient has had pus drained from underneath the proximal nail fold.  He has poked it several times with a sterilized needle to try to release the pus.  Finger began to throb yesterday prompting him to make this appointment.  The distal portion of his right fourth digit from roughly the level of the DIP joint all the way down to the tip of the finger is red circumferentially.  There is significant swelling in the proximal nail fold.  There is a trace amount of pus visible underneath the proximal nail fold.  The finger is throbbing and tender to touch.  At that time, my plan was: I anesthetized the right fourth finger using a digital block with 0.1% lidocaine with out epinephrine.  A tourniquet was applied at the base of the finger.  Using a scalpel, I inserted the tip of the scalpel underneath the proximal nail fold and released blood and pus which was cultured.  I then probed and cleaned the area thoroughly with Q-tips using hydrogen peroxide.  No residual pus was expressed from the wound.  Begin Bactrim double strength tablets 1 p.o. twice daily immediately.  Recheck in 48 hours.  The wound was covered with Neosporin wrapped with petroleum gauze and then wrapped with coban.  Tourniquet was removed.  Total tourniquet time was less than 2 minutes.  Estimated blood loss was 5 mL  06/20/18 Patient is here today for follow-up.  Redness has subsided and faded since his last visit although still present.  Patient states his finger is still throbbing at night  although better.  There is no further purulent drainage coming from under the proximal nail fold.  I undressed his finger today.  The erythema is less.  Palpation of the proximal nail fold does not express any additional pus.  However the finger is still quite tender.  At that time, my plan was: Erythema has faded although still present but the wound seems to be improving and the cellulitis seems to be improving.  There is no additional pus leaking from under the proximal nail fold.  I coated the tip of his finger and Silvadene.  I wrapped it with petroleum gauze and then wrapped it gently with coban.  We discussed wound care and how to perform wound care daily.  Finish his antibiotic.  Recheck next week if not substantially better or sooner if getting worse.  06/21/18 Patient appears today because he is concerned that the finger looks worse.  Erythema has spread slightly proximally.  Previously it was 2.5 cm in length.  Currently is 3 cm in length.  There is also purulent material draining from underneath the proximal nail fold again.  Culture results so far have only revealed gram-positive cocci in pairs.  Patient states that he has been trying to clean the pus out from underneath the nail fold by sterilizing a knife and using that.  At that time my plan was: I discussed with  the patient that I am concerned about possible underlying osteomyelitis.  I explained that the only way to determine this is to get an MRI by going to the emergency room at this time as I am not able to schedule this as an outpatient procedure on Friday evening.  Patient declines going to the emergency room.  I am concerned about possible MRSA despite the culture results.  Therefore I will add clindamycin 300 mg p.o. 4 times daily.  I also anesthetized the finger with a digital block again.  Using a metal blade , I drained additional purulent material from underneath the proximal nail fold.  I was able to insert a Q-tip soaked in hydrogen  peroxide under the proximal nail fold to clean out the cavity of the purulent material.  A repeat wound culture was sent as well of this material I also made a vertical incision on the finger pad itself to inspect for possible felon given the swelling and worsening physical appearance.  No pus was expressed from the finger pad suggesting against a felon.  Therefore I am concerned that if the condition worsens the patient may have osteomyelitis.  We discussed at length.  If the finger worsens, he is to go to the emergency room over the weekend.  We discussed the risk of osteomyelitis.  He will try clindamycin.  He will discontinue probing the wound with a pocket knife or needle on his own as I am concerned that this could be contaminating the wound and propagating the infection.  Patient will undress the finger in the morning.  He will clean it with soapy water dry it thoroughly and then cover it with Polysporin and wrap it with a Band-Aid.  He is to continue this daily and come back in early next week for reassessment.  If worsening he needs to go directly to the emergency room for possible osteomyelitis.  Patient understands and agrees with the plan  06/25/18 Patient took 3 doses of clindamycin on Friday and developed severe nausea and vomiting.  He stopped the clindamycin after Saturday morning and has not had any additional antibiotics since Saturday.  However the pain in his finger has subsided dramatically.  The erythema has faded and the swelling has faded.  No more pus is coming from underneath his proximal nail fold.  Culture returned showing staph aureus sensitive to doxycycline, Bactrim, and clindamycin.  At that time, my plan was: The infection has improved after only taking 24 hours of clindamycin.  Although it is better I have recommended that he complete antibiotic therapy therefore I will switch the patient to doxycycline 100 mg twice daily for 10 days to complete treatment for this infection.   Reassess later this week if no better or sooner if worse however clinically it appears much better today  08/19/18 Patient had an EGD performed in June of last year.  At that time he was found to have a mild Schatzki's ring that was dilated due to dysphasia.  He was also found to have a 2 cm hiatal hernia.  Patient has discontinued his proton pump inhibitor.  He states that over the last several months he has developed a persistent daily cough.  Is usually worse when he is supine.  He also has daily heartburn.  He also reports dysphasia.  He is now having a difficult time swallowing solids such as chicken or red meat.  Liquids passed without difficulty but he reports food sticking particular if it is tough for  food.  He denies any hematemesis.  He denies any melena or hematochezia.  He is previously seen Dr. Hilarie Fredrickson but would like a referral to [place] due to the proximity to his house.  He has moved since the last time and its very inconvenient for him to drive to [place] now.  He would be more comfortable following up in [place].  Past Medical History:  Diagnosis Date  . AAA (abdominal aortic aneurysm) ([place])   . Actinic keratosis   . Allergy    mild   . Anxiety   . CKD (chronic kidney disease) stage 3, GFR 30-59 ml/min (HCC)   . Diverticulosis of colon (without mention of hemorrhage)   . Duodenitis without mention of hemorrhage   . Esophageal reflux   . Hypertrophy of prostate without urinary obstruction and other lower urinary tract symptoms (LUTS)   . Left sided ulcerative (chronic) colitis ([place])   . Mononeuritis of unspecified site   . Neuropathy   . Nonspecific abnormal results of thyroid function study   . Prostate cancer ([place])    cryotherapy 2011 [facility])  . Schatzki's ring   . Sleep apnea    ahi 75, cpap 14  . Stricture and stenosis of esophagus   . TIA (transient ischemic attack) 2017   pt states was told mini stroke  . Unspecified essential hypertension    Past Surgical  History:  Procedure Laterality Date  . ABDOMINAL AORTIC ANEURYSM REPAIR     2018  . BALLOON DILATION  03/22/2011   Procedure: BALLOON DILATION;  Surgeon: Inda Castle, MD;  Location: [facility];  Service: Endoscopy;  Laterality: N/A;  kelly/ebp  . CHOLECYSTECTOMY    . COLONOSCOPY    . ENDOVASCULAR REPAIR/STENT GRAFT N/A 03/29/2016   Procedure: Endovascular Repair/Stent Graft;  Surgeon: Algernon Huxley, MD;  Location: [facility];  Service: Cardiovascular;  Laterality: N/A;  . ESOPHAGOGASTRODUODENOSCOPY  03/22/2011   Procedure: ESOPHAGOGASTRODUODENOSCOPY (EGD);  Surgeon: Inda Castle, MD;  Location: [facility];  Service: Endoscopy;  Laterality: N/A;  . PROSTATE SURGERY    . UPPER GASTROINTESTINAL ENDOSCOPY     Current Outpatient Medications on File Prior to Visit  Medication Sig Dispense Refill  . aspirin EC 81 MG tablet Take 1 tablet (81 mg total) by mouth daily. 30 tablet 11  . clobetasol (TEMOVATE) 0.05 % external solution Apply 1 application topically 2 (two) times daily. 50 mL 5  . imipramine (TOFRANIL) 25 MG tablet Take 25 mg by mouth at bedtime.    . nebivolol (BYSTOLIC) 10 MG tablet Take 0.5 tablets (5 mg total) by mouth at bedtime. 90 tablet 1   No current facility-administered medications on file prior to visit.    Allergies  Allergen Reactions  . Metronidazole Anaphylaxis  . Amoxicillin-Pot Clavulanate Other (See Comments)    UNKNOWN  . Ciprofloxacin Other (See Comments)    UNKNOWN  . Clindamycin/Lincomycin Other (See Comments)    Nausea, stomach pain, sweats  . Mesalamine     REACTION: Intolerance   Social History   Socioeconomic History  . Marital status: Married    Spouse name: Not on file  . Number of children: 1  . Years of education: Some colg  . Highest education level: Not on file  Occupational History  . Occupation: Retired   Scientific laboratory technician  . Financial resource strain: Not on file  . Food insecurity    Worry: Not on file    Inability:  Not on file  . Transportation  needs    Medical: Not on file    Non-medical: Not on file  Tobacco Use  . Smoking status: Never Smoker  . Smokeless tobacco: Never Used  Substance and Sexual Activity  . Alcohol use: Yes    Alcohol/week: 7.0 standard drinks    Types: 7 Glasses of wine per week  . Drug use: No  . Sexual activity: Never  Lifestyle  . Physical activity    Days per week: Not on file    Minutes per session: Not on file  . Stress: Not on file  Relationships  . Social Herbalist on phone: Not on file    Gets together: Not on file    Attends religious service: Not on file    Active member of club or organization: Not on file    Attends meetings of clubs or organizations: Not on file    Relationship status: Not on file  . Intimate partner violence    Fear of current or ex partner: Not on file    Emotionally abused: Not on file    Physically abused: Not on file    Forced sexual activity: Not on file  Other Topics Concern  . Not on file  Social History Narrative   1 -2 cups of coffee a day, some tea consumption       Review of Systems  All other systems reviewed and are negative.      Objective:   Physical Exam  Constitutional: He appears well-developed and well-nourished. No distress.  Cardiovascular: Normal rate, regular rhythm and normal heart sounds. Exam reveals no gallop and no friction rub.  No murmur heard. Pulmonary/Chest: Effort normal and breath sounds normal. No respiratory distress. He has no wheezes. He has no rales.  Abdominal: Soft. Bowel sounds are normal.  Neurological: He has normal strength.  Skin: He is not diaphoretic.  Vitals reviewed.      Assessment & Plan:  The encounter diagnosis was Esophageal dysphagia. I explained to the patient the natural history of hiatal hernia.  I recommended that he resume his omeprazole 40 mg a day and stay on this indefinitely.  I believe that this would eventually help with his persistent  cough and his daily heartburn.  It may even help prevent recurrence of esophageal stricture and his dysphasia.  Meanwhile I will get the patient scheduled for an EGD with Dr. Gala Romney as it sounds like he may require dilatation again due to the dysphasia and sticking that he is experiencing.  Patient would like to start something for anxiety.  In the past he took Celexa but he believes he became "immune to it".  We discussed that he would like to try Lexapro 10 mg a day.

## 2018-08-19 NOTE — Telephone Encounter (Signed)
Referral placed at [facility]

## 2018-08-21 ENCOUNTER — Telehealth: Payer: Self-pay | Admitting: Internal Medicine

## 2018-08-21 NOTE — Telephone Encounter (Signed)
Pt states he is having problems with coughing and swallowing again. Requesting to have dilation done asap. Please advise if pt need OV prior to scheduling procedure.

## 2018-08-21 NOTE — Telephone Encounter (Signed)
OK for direct EGD, see note below.

## 2018-08-21 NOTE — Telephone Encounter (Signed)
Patient called in wanting to schedule for and endo asap... I did show that he his las was on 06/2017 however I did not show I recall tab was not sure if I was able to schedule endo.. please contact patient to advise/schedule

## 2018-08-21 NOTE — Telephone Encounter (Signed)
Left message for pt to call back  °

## 2018-08-21 NOTE — Telephone Encounter (Signed)
Ok for [facility] EGD with dilation  

## 2018-08-22 ENCOUNTER — Ambulatory Visit: Payer: PPO | Admitting: *Deleted

## 2018-08-22 VITALS — Ht 75.0 in | Wt 220.0 lb

## 2018-08-22 DIAGNOSIS — R1310 Dysphagia, unspecified: Secondary | ICD-10-CM

## 2018-08-22 DIAGNOSIS — R1319 Other dysphagia: Secondary | ICD-10-CM

## 2018-08-22 NOTE — Progress Notes (Signed)
Patient's pre-visit was done today over the phone with the patient due to COVID-19 pandemic. Name,DOB and address verified. Insurance verified. Packet of Prep instructions mailed to patient yesterday-pt is aware. Pt did write down prep instructions during phone PV.  Patient understands to call us back with any questions or concerns.  Patient denies any allergies to eggs or soy. Patient denies any problems with anesthesia/sedation. Patient denies any oxygen use at home. Patient denies taking any diet/weight loss medications or blood thinners.Pt is aware that care partner will wait in the car during procedure; if they feel like they will be too hot to wait in the car; they may wait in the lobby.  We want them to wear a mask (we do not have any that we can provide them), practice social distancing, and we will check their temperatures when they get here.  I did remind patient that their care partner needs to stay in the parking lot the entire time. Pt will wear mask into building.

## 2018-08-23 ENCOUNTER — Telehealth: Payer: Self-pay | Admitting: Internal Medicine

## 2018-08-23 NOTE — Telephone Encounter (Signed)
No answer or Vmail on either phone number listed, unable to leave message regarding Covid-19 screening questions. Covid-19 Screening Questions:  Do you now or have you had a fever in the last 14 days?   Do you have any respiratory symptoms of shortness of breath or cough now or in the last 14 days?   Do you have any family members or close contacts with diagnosed or suspected Covid-19 in the past 14 days?   Have you been tested for Covid-19 and found to be positive?

## 2018-08-26 ENCOUNTER — Encounter: Payer: Self-pay | Admitting: Internal Medicine

## 2018-08-26 ENCOUNTER — Ambulatory Visit (AMBULATORY_SURGERY_CENTER): Payer: PPO | Admitting: Internal Medicine

## 2018-08-26 ENCOUNTER — Encounter: Payer: PPO | Admitting: Internal Medicine

## 2018-08-26 ENCOUNTER — Other Ambulatory Visit: Payer: Self-pay

## 2018-08-26 VITALS — BP 118/70 | HR 53 | Temp 97.5°F | Resp 13 | Ht 75.0 in | Wt 220.0 lb

## 2018-08-26 DIAGNOSIS — K219 Gastro-esophageal reflux disease without esophagitis: Secondary | ICD-10-CM | POA: Diagnosis not present

## 2018-08-26 DIAGNOSIS — K222 Esophageal obstruction: Secondary | ICD-10-CM

## 2018-08-26 DIAGNOSIS — K449 Diaphragmatic hernia without obstruction or gangrene: Secondary | ICD-10-CM | POA: Diagnosis not present

## 2018-08-26 DIAGNOSIS — R1310 Dysphagia, unspecified: Secondary | ICD-10-CM

## 2018-08-26 DIAGNOSIS — K2970 Gastritis, unspecified, without bleeding: Secondary | ICD-10-CM | POA: Diagnosis not present

## 2018-08-26 DIAGNOSIS — R1319 Other dysphagia: Secondary | ICD-10-CM

## 2018-08-26 DIAGNOSIS — K269 Duodenal ulcer, unspecified as acute or chronic, without hemorrhage or perforation: Secondary | ICD-10-CM | POA: Diagnosis not present

## 2018-08-26 DIAGNOSIS — K2950 Unspecified chronic gastritis without bleeding: Secondary | ICD-10-CM | POA: Diagnosis not present

## 2018-08-26 MED ORDER — SODIUM CHLORIDE 0.9 % IV SOLN: 500.0000 mL | Freq: Once | INTRAVENOUS | Status: DC

## 2018-08-26 MED ORDER — OMEPRAZOLE 40 MG PO CPDR: 40.0000 mg | DELAYED_RELEASE_CAPSULE | Freq: Two times a day (BID) | ORAL | 0 refills | Status: DC

## 2018-08-26 NOTE — Progress Notes (Signed)
To PACU, VSS. Report to [email or web address] 

## 2018-08-26 NOTE — Op Note (Signed)
[facility] Patient Name: Matthew Luna Procedure Date: 08/26/2018 11:10 AM MRN: [id number] Endoscopist: Jerene Bears , MD Age: 78 Referring MD:  Date of Birth: January 09, 1941 Gender: Male Account #: [id number] Procedure:                Upper GI endoscopy Indications:              Dysphagia (recurrent), history of esophageal                            stricture with last dilation 1 year ago Medicines:                Monitored Anesthesia Care Procedure:                Pre-Anesthesia Assessment:                           - Prior to the procedure, a History and Physical                            was performed, and patient medications and                            allergies were reviewed. The patient's tolerance of                            previous anesthesia was also reviewed. The risks                            and benefits of the procedure and the sedation                            options and risks were discussed with the patient.                            All questions were answered, and informed consent                            was obtained. Prior Anticoagulants: The patient has                            taken no previous anticoagulant or antiplatelet                            agents. ASA Grade Assessment: III - A patient with                            severe systemic disease. After reviewing the risks                            and benefits, the patient was deemed in                            satisfactory condition to undergo the procedure.  After obtaining informed consent, the endoscope was                            passed under direct vision. Throughout the                            procedure, the patient's blood pressure, pulse, and                            oxygen saturations were monitored continuously. The                            Model GIF-HQ190 [id number]) scope was introduced                            through the mouth,  and advanced to the second part                            of duodenum. The upper GI endoscopy was                            accomplished without difficulty. The patient                            tolerated the procedure well. Scope In: Scope Out: Findings:                 The middle third of the esophagus and lower third                            of the esophagus were moderately tortuous.                           A low-grade of narrowing Schatzki ring was found at                            the gastroesophageal junction. A TTS dilator was                            passed through the scope. Dilation with a                            13.5-14.5-15.5 mm balloon dilator was performed to                            14.5 mm. The dilation site was examined and showed                            moderate mucosal disruption and moderate                            improvement in luminal narrowing. There was an area  of nodularity on the cardia side of the GE                            junction. This area was biopsied with a cold                            forceps for histology.                           A 3 cm hiatal hernia was present.                           Localized mild inflammation characterized by                            erosions and erythema was found in the gastric                            antrum. Biopsies were taken with a cold forceps for                            histology and Helicobacter pylori testing.                           Few non-bleeding superficial duodenal ulcers were                            found in the duodenal bulb.                           The second portion of the duodenum was normal. Complications:            No immediate complications. Estimated Blood Loss:     Estimated blood loss was minimal. Impression:               - Tortuous esophagus.                           - Low-grade of narrowing Schatzki ring. Dilated.                             Biopsied.                           - 3 cm hiatal hernia.                           - Gastritis. Biopsied.                           - Non-bleeding duodenal ulcers.                           - Normal second portion of the duodenum. Recommendation:           - Patient has a contact number available for  emergencies. The signs and symptoms of potential                            delayed complications were discussed with the                            patient. Return to normal activities tomorrow.                            Written discharge instructions were provided to the                            patient.                           - Post-dilation diet then advance as tolerated.                           - Continue present medications. Increase omeprazole                            to 40 mg twice daily x 1 month, then return to once                            daily. Avoid NSAIDs given presence of ulcers.                           - Await pathology results.                           - Repeat EGD as needed for repeat dilation. Jerene Bears, MD 08/26/2018 11:44:37 AM This report has been signed electronically.

## 2018-08-26 NOTE — Progress Notes (Signed)
Pt's states no medical or surgical changes since previsit or office visit. 

## 2018-08-26 NOTE — Progress Notes (Signed)
JM- Temp CW- vitals

## 2018-08-26 NOTE — Patient Instructions (Signed)
Esophageal narrowing which was dilated and biopsied.  3 cm hiatal hernia.  Gastritis.  Biopsied.  Non-bleeding duodenal ulcers. AVOID NSAIDS BECAUSE OF THE PRESENCE OF ULCERS. INCREASE OMEPRAZOLE TO 40MG  TWICE DAILY X 1 MONTH. REPEAT EGD ANYTIME YOU NEED A REPEAT DILATION.      YOU HAD AN ENDOSCOPIC PROCEDURE TODAY AT [facility]:   Refer to the procedure report that was given to you for any specific questions about what was found during the examination.  If the procedure report does not answer your questions, please call your gastroenterologist to clarify.  If you requested that your care partner not be given the details of your procedure findings, then the procedure report has been included in a sealed envelope for you to review at your convenience later.  YOU SHOULD EXPECT: Some feelings of bloating in the abdomen. Passage of more gas than usual.  Walking can help get rid of the air that was put into your GI tract during the procedure and reduce the bloating. If you had a lower endoscopy (such as a colonoscopy or flexible sigmoidoscopy) you may notice spotting of blood in your stool or on the toilet paper. If you underwent a bowel prep for your procedure, you may not have a normal bowel movement for a few days.  Please Note:  You might notice some irritation and congestion in your nose or some drainage.  This is from the oxygen used during your procedure.  There is no need for concern and it should clear up in a day or so.  SYMPTOMS TO REPORT IMMEDIATELY:   Following upper endoscopy (EGD)  Vomiting of blood or coffee ground material  New chest pain or pain under the shoulder blades  Painful or persistently difficult swallowing  New shortness of breath  Fever of 100F or higher  Black, tarry-looking stools  For urgent or emergent issues, a gastroenterologist can be reached at any hour by calling [phone].   DIET:  We do recommend a small meal at first, but then  you may proceed to your regular diet.  Drink plenty of fluids but you should avoid alcoholic beverages for 24 hours.  ACTIVITY:  You should plan to take it easy for the rest of today and you should NOT DRIVE or use heavy machinery until tomorrow (because of the sedation medicines used during the test).    FOLLOW UP: Our staff will call the number listed on your records 48-72 hours following your procedure to check on you and address any questions or concerns that you may have regarding the information given to you following your procedure. If we do not reach you, we will leave a message.  We will attempt to reach you two times.  During this call, we will ask if you have developed any symptoms of COVID 19. If you develop any symptoms (ie: fever, flu-like symptoms, shortness of breath, cough etc.) before then, please call [phone].  If you test positive for Covid 19 in the 2 weeks post procedure, please call and report this information to [place].    If any biopsies were taken you will be contacted by phone or by letter within the next 1-3 weeks.  Please call us at [phone] if you have not heard about the biopsies in 3 weeks.    SIGNATURES/CONFIDENTIALITY: You and/or your care partner have signed paperwork which will be entered into your electronic medical record.  These signatures attest to the fact that that the information above  on your After Visit Summary has been reviewed and is understood.  Full responsibility of the confidentiality of this discharge information lies with you and/or your care-partner.

## 2018-08-26 NOTE — Progress Notes (Signed)
Called to room to assist during endoscopic procedure.  Patient ID and intended procedure confirmed with present staff. Received instructions for my participation in the procedure from the performing physician.  

## 2018-08-28 ENCOUNTER — Telehealth: Payer: Self-pay | Admitting: Family Medicine

## 2018-08-28 ENCOUNTER — Telehealth: Payer: Self-pay

## 2018-08-28 ENCOUNTER — Telehealth: Payer: Self-pay | Admitting: *Deleted

## 2018-08-28 MED ORDER — MIRABEGRON ER 50 MG PO TB24: 50.0000 mg | ORAL_TABLET | Freq: Every day | ORAL | 5 refills | Status: DC

## 2018-08-28 NOTE — Telephone Encounter (Signed)
Pt needs refill on myrbetriq to [facility]

## 2018-08-28 NOTE — Telephone Encounter (Signed)
First attempt, left VM.  

## 2018-08-28 NOTE — Telephone Encounter (Signed)
Medication called/sent to requested pharmacy  

## 2018-08-28 NOTE — Telephone Encounter (Signed)
Second post procedure follow up call, no answer 

## 2018-08-29 ENCOUNTER — Encounter: Payer: Self-pay | Admitting: Internal Medicine

## 2018-09-16 ENCOUNTER — Other Ambulatory Visit: Payer: Self-pay | Admitting: *Deleted

## 2018-09-16 MED ORDER — ESCITALOPRAM OXALATE 10 MG PO TABS: 10.0000 mg | ORAL_TABLET | Freq: Every day | ORAL | 3 refills | Status: DC

## 2018-10-14 ENCOUNTER — Other Ambulatory Visit: Payer: Self-pay | Admitting: Family Medicine

## 2018-10-22 DIAGNOSIS — H3589 Other specified retinal disorders: Secondary | ICD-10-CM | POA: Diagnosis not present

## 2018-10-31 DIAGNOSIS — H353111 Nonexudative age-related macular degeneration, right eye, early dry stage: Secondary | ICD-10-CM | POA: Diagnosis not present

## 2018-11-10 ENCOUNTER — Other Ambulatory Visit: Payer: Self-pay | Admitting: Family Medicine

## 2018-12-03 DIAGNOSIS — H3589 Other specified retinal disorders: Secondary | ICD-10-CM | POA: Diagnosis not present

## 2018-12-03 DIAGNOSIS — H353111 Nonexudative age-related macular degeneration, right eye, early dry stage: Secondary | ICD-10-CM | POA: Diagnosis not present

## 2018-12-12 ENCOUNTER — Ambulatory Visit (INDEPENDENT_AMBULATORY_CARE_PROVIDER_SITE_OTHER): Payer: PPO | Admitting: Family Medicine

## 2018-12-12 ENCOUNTER — Other Ambulatory Visit: Payer: Self-pay

## 2018-12-12 ENCOUNTER — Encounter: Payer: Self-pay | Admitting: Family Medicine

## 2018-12-12 VITALS — BP 112/64 | HR 66 | Temp 98.5°F | Resp 16 | Ht 75.0 in | Wt 225.0 lb

## 2018-12-12 DIAGNOSIS — L03811 Cellulitis of head [any part, except face]: Secondary | ICD-10-CM

## 2018-12-12 DIAGNOSIS — B028 Zoster with other complications: Secondary | ICD-10-CM | POA: Diagnosis not present

## 2018-12-12 MED ORDER — GABAPENTIN 100 MG PO CAPS: 100.0000 mg | ORAL_CAPSULE | Freq: Every day | ORAL | 3 refills | Status: DC

## 2018-12-12 MED ORDER — VALACYCLOVIR HCL 1 G PO TABS: 1000.0000 mg | ORAL_TABLET | Freq: Three times a day (TID) | ORAL | 0 refills | Status: DC

## 2018-12-12 MED ORDER — CEPHALEXIN 500 MG PO CAPS: 500.0000 mg | ORAL_CAPSULE | Freq: Three times a day (TID) | ORAL | 0 refills | Status: DC

## 2018-12-12 NOTE — Progress Notes (Signed)
Subjective:    Patient ID: Matthew Luna, male    DOB: Jun 06, 1940, 78 y.o.   MRN: [id number]      Patient thinks he has a sunburn.  He mowed the yard for 30 minutes 5 days ago.  The following day this rash appeared on the crown of his head.  It is isolated just to this area.  It does not extend down into the hair.  The rash is painful red and hot.  There are numerous small 1 mm pustules and vesicles.  The skin burns and stings.  He states that the day he mowed, he used [employer] to wash his hair.  The rash appeared the following day.  Therefore he believes the reaction is either an allergic reaction to [facility] or sunburn.  However the rash to me appears to be widespread shingles with a possible secondary cellulitis.  A chemical burn would also appear similar to this however I do not believe [facility] would likely cause a chemical burn and he is no longer using the product.  I do not believe that this is a sunburn given the length of exposure.  Past Medical History:  Diagnosis Date  . AAA (abdominal aortic aneurysm) ([place])   . Actinic keratosis   . Allergy    mild   . Anxiety   . CKD (chronic kidney disease) stage 3, GFR 30-59 ml/min   . Diverticulosis of colon (without mention of hemorrhage)   . Duodenitis without mention of hemorrhage   . Esophageal reflux   . Hypertrophy of prostate without urinary obstruction and other lower urinary tract symptoms (LUTS)   . Left sided ulcerative (chronic) colitis ([place])   . Mononeuritis of unspecified site   . Neuropathy   . Nonspecific abnormal results of thyroid function study   . Prostate cancer ([place])    cryotherapy 2011 [facility])  . Schatzki's ring   . Sleep apnea    does not use the CPAP  . Stricture and stenosis of esophagus   . TIA (transient ischemic attack) 2017   pt states was told mini stroke  . Unspecified essential hypertension    Past Surgical History:  Procedure Laterality Date  . ABDOMINAL AORTIC ANEURYSM REPAIR     2018  . BALLOON DILATION  03/22/2011   Procedure: BALLOON DILATION;  Surgeon: Inda Castle, MD;  Location: [facility];  Service: Endoscopy;  Laterality: N/A;  kelly/ebp  . CHOLECYSTECTOMY    . COLONOSCOPY    . ENDOVASCULAR REPAIR/STENT GRAFT N/A 03/29/2016   Procedure: Endovascular Repair/Stent Graft;  Surgeon: Algernon Huxley, MD;  Location: [facility];  Service: Cardiovascular;  Laterality: N/A;  . ESOPHAGOGASTRODUODENOSCOPY  03/22/2011   Procedure: ESOPHAGOGASTRODUODENOSCOPY (EGD);  Surgeon: Inda Castle, MD;  Location: [facility];  Service: Endoscopy;  Laterality: N/A;  . PROSTATE SURGERY    . UPPER GASTROINTESTINAL ENDOSCOPY     Current Outpatient Medications on File Prior to Visit  Medication Sig Dispense Refill  . aspirin EC 81 MG tablet Take 1 tablet (81 mg total) by mouth daily. 30 tablet 11  . citalopram (CELEXA) 40 MG tablet TAKE 1 TABLET BY MOUTH EVERY DAY 90 tablet 3  . clobetasol (TEMOVATE) 0.05 % external solution Apply 1 application topically 2 (two) times daily. 50 mL 5  . escitalopram (LEXAPRO) 10 MG tablet Take 1 tablet (10 mg total) by mouth daily. 90 tablet 3  . imipramine (TOFRANIL) 25 MG tablet Take 25 mg by mouth at  bedtime.    . mirabegron ER (MYRBETRIQ) 50 MG TB24 tablet Take 1 tablet (50 mg total) by mouth daily. 30 tablet 5  . nebivolol (BYSTOLIC) 10 MG tablet Take 0.5 tablets (5 mg total) by mouth at bedtime. 90 tablet 1  . omeprazole (PRILOSEC) 40 MG capsule TAKE 1 CAPSULE BY MOUTH EVERY DAY 90 capsule 1  . rosuvastatin (CRESTOR) 20 MG tablet TAKE 1 TABLET BY MOUTH EVERY DAY 90 tablet 1  . gabapentin (NEURONTIN) 300 MG capsule Take 300 mg by mouth at bedtime.     No current facility-administered medications on file prior to visit.    Allergies  Allergen Reactions  . Metronidazole Anaphylaxis  . Amoxicillin-Pot Clavulanate Other (See Comments)    UNKNOWN  . Ciprofloxacin Other (See Comments)    UNKNOWN  . Clindamycin/Lincomycin Other (See  Comments)    Nausea, stomach pain, sweats  . Mesalamine     REACTION: Intolerance   Social History   Socioeconomic History  . Marital status: Married    Spouse name: Not on file  . Number of children: 1  . Years of education: Some colg  . Highest education level: Not on file  Occupational History  . Occupation: Retired   Scientific laboratory technician  . Financial resource strain: Not on file  . Food insecurity    Worry: Not on file    Inability: Not on file  . Transportation needs    Medical: Not on file    Non-medical: Not on file  Tobacco Use  . Smoking status: Never Smoker  . Smokeless tobacco: Never Used  Substance and Sexual Activity  . Alcohol use: Yes    Alcohol/week: 14.0 standard drinks    Types: 14 Glasses of wine per week    Comment: per pt -drinks 10 oz red wine every night!  . Drug use: No  . Sexual activity: Never  Lifestyle  . Physical activity    Days per week: Not on file    Minutes per session: Not on file  . Stress: Not on file  Relationships  . Social Herbalist on phone: Not on file    Gets together: Not on file    Attends religious service: Not on file    Active member of club or organization: Not on file    Attends meetings of clubs or organizations: Not on file    Relationship status: Not on file  . Intimate partner violence    Fear of current or ex partner: Not on file    Emotionally abused: Not on file    Physically abused: Not on file    Forced sexual activity: Not on file  Other Topics Concern  . Not on file  Social History Narrative   1 -2 cups of coffee a day, some tea consumption       Review of Systems  All other systems reviewed and are negative.      Objective:   Physical Exam  Constitutional: He appears well-developed and well-nourished. No distress.  HENT:  Head:    Cardiovascular: Normal rate, regular rhythm and normal heart sounds. Exam reveals no gallop and no friction rub.  No murmur heard. Pulmonary/Chest:  Effort normal and breath sounds normal. No respiratory distress. He has no wheezes. He has no rales.  Abdominal: Soft. Bowel sounds are normal.  Neurological: He has normal strength.  Skin: Rash noted. He is not diaphoretic. There is erythema.  Vitals reviewed.      Assessment &  Plan:  The primary encounter diagnosis was Herpes zoster with other complication. A diagnosis of Cellulitis of head or scalp was also pertinent to this visit. Differential diagnosis includes burn, chemical burn/allergic reaction, shingles, cellulitis.  Given his presentation and symptoms I believe this is shingles with secondary cellulitis.  I will treat the patient with Valtrex 1 g p.o. 3 times daily for 7 days.  I will also treat the cellulitis with Keflex 500 mg p.o. 3 times daily for 7 days.  Recommended the patient apply Vaseline as a soothing ointment to help with the pain and blistering on his scalp.  Avoid contact with sun and [place].  Reassess in 48 hours sooner if worsening.  Decreased dose of gabapentin from 300 mg at night to 100 mg at night for neuropathy as 300 mg caused dizziness.

## 2018-12-25 ENCOUNTER — Other Ambulatory Visit: Payer: Self-pay

## 2019-01-02 DIAGNOSIS — H3589 Other specified retinal disorders: Secondary | ICD-10-CM | POA: Diagnosis not present

## 2019-01-08 DIAGNOSIS — Z881 Allergy status to other antibiotic agents status: Secondary | ICD-10-CM | POA: Diagnosis not present

## 2019-01-08 DIAGNOSIS — N529 Male erectile dysfunction, unspecified: Secondary | ICD-10-CM | POA: Diagnosis not present

## 2019-01-08 DIAGNOSIS — I10 Essential (primary) hypertension: Secondary | ICD-10-CM | POA: Diagnosis not present

## 2019-01-08 DIAGNOSIS — Z87442 Personal history of urinary calculi: Secondary | ICD-10-CM | POA: Diagnosis not present

## 2019-01-08 DIAGNOSIS — N401 Enlarged prostate with lower urinary tract symptoms: Secondary | ICD-10-CM | POA: Diagnosis not present

## 2019-01-08 DIAGNOSIS — N200 Calculus of kidney: Secondary | ICD-10-CM | POA: Diagnosis not present

## 2019-01-08 DIAGNOSIS — C61 Malignant neoplasm of prostate: Secondary | ICD-10-CM | POA: Diagnosis not present

## 2019-01-08 DIAGNOSIS — Z79899 Other long term (current) drug therapy: Secondary | ICD-10-CM | POA: Diagnosis not present

## 2019-01-08 DIAGNOSIS — R350 Frequency of micturition: Secondary | ICD-10-CM | POA: Diagnosis not present

## 2019-01-08 DIAGNOSIS — Z8546 Personal history of malignant neoplasm of prostate: Secondary | ICD-10-CM | POA: Diagnosis not present

## 2019-01-11 ENCOUNTER — Other Ambulatory Visit: Payer: Self-pay | Admitting: Family Medicine

## 2019-02-04 ENCOUNTER — Other Ambulatory Visit: Payer: Self-pay | Admitting: Family Medicine

## 2019-02-07 DIAGNOSIS — M9903 Segmental and somatic dysfunction of lumbar region: Secondary | ICD-10-CM | POA: Diagnosis not present

## 2019-02-07 DIAGNOSIS — I10 Essential (primary) hypertension: Secondary | ICD-10-CM | POA: Diagnosis not present

## 2019-02-07 DIAGNOSIS — M9902 Segmental and somatic dysfunction of thoracic region: Secondary | ICD-10-CM | POA: Diagnosis not present

## 2019-02-07 DIAGNOSIS — M546 Pain in thoracic spine: Secondary | ICD-10-CM | POA: Diagnosis not present

## 2019-02-07 DIAGNOSIS — M545 Low back pain: Secondary | ICD-10-CM | POA: Diagnosis not present

## 2019-02-07 DIAGNOSIS — M9905 Segmental and somatic dysfunction of pelvic region: Secondary | ICD-10-CM | POA: Diagnosis not present

## 2019-02-12 DIAGNOSIS — M9905 Segmental and somatic dysfunction of pelvic region: Secondary | ICD-10-CM | POA: Diagnosis not present

## 2019-02-12 DIAGNOSIS — M545 Low back pain: Secondary | ICD-10-CM | POA: Diagnosis not present

## 2019-02-12 DIAGNOSIS — M5413 Radiculopathy, cervicothoracic region: Secondary | ICD-10-CM | POA: Diagnosis not present

## 2019-02-12 DIAGNOSIS — M9903 Segmental and somatic dysfunction of lumbar region: Secondary | ICD-10-CM | POA: Diagnosis not present

## 2019-02-12 DIAGNOSIS — M546 Pain in thoracic spine: Secondary | ICD-10-CM | POA: Diagnosis not present

## 2019-02-12 DIAGNOSIS — M9901 Segmental and somatic dysfunction of cervical region: Secondary | ICD-10-CM | POA: Diagnosis not present

## 2019-02-12 DIAGNOSIS — M9902 Segmental and somatic dysfunction of thoracic region: Secondary | ICD-10-CM | POA: Diagnosis not present

## 2019-02-14 DIAGNOSIS — M9905 Segmental and somatic dysfunction of pelvic region: Secondary | ICD-10-CM | POA: Diagnosis not present

## 2019-02-14 DIAGNOSIS — M545 Low back pain: Secondary | ICD-10-CM | POA: Diagnosis not present

## 2019-02-14 DIAGNOSIS — M9903 Segmental and somatic dysfunction of lumbar region: Secondary | ICD-10-CM | POA: Diagnosis not present

## 2019-02-14 DIAGNOSIS — M5413 Radiculopathy, cervicothoracic region: Secondary | ICD-10-CM | POA: Diagnosis not present

## 2019-02-14 DIAGNOSIS — M546 Pain in thoracic spine: Secondary | ICD-10-CM | POA: Diagnosis not present

## 2019-02-14 DIAGNOSIS — M9901 Segmental and somatic dysfunction of cervical region: Secondary | ICD-10-CM | POA: Diagnosis not present

## 2019-02-14 DIAGNOSIS — M9902 Segmental and somatic dysfunction of thoracic region: Secondary | ICD-10-CM | POA: Diagnosis not present

## 2019-02-17 DIAGNOSIS — H3589 Other specified retinal disorders: Secondary | ICD-10-CM | POA: Diagnosis not present

## 2019-03-05 ENCOUNTER — Other Ambulatory Visit: Payer: Self-pay | Admitting: Family Medicine

## 2019-04-30 ENCOUNTER — Other Ambulatory Visit: Payer: Self-pay | Admitting: Family Medicine

## 2019-05-14 DIAGNOSIS — N281 Cyst of kidney, acquired: Secondary | ICD-10-CM | POA: Diagnosis not present

## 2019-05-14 DIAGNOSIS — N261 Atrophy of kidney (terminal): Secondary | ICD-10-CM | POA: Diagnosis not present

## 2019-05-14 DIAGNOSIS — N320 Bladder-neck obstruction: Secondary | ICD-10-CM | POA: Diagnosis not present

## 2019-05-14 DIAGNOSIS — R3912 Poor urinary stream: Secondary | ICD-10-CM | POA: Diagnosis not present

## 2019-05-14 DIAGNOSIS — Z8546 Personal history of malignant neoplasm of prostate: Secondary | ICD-10-CM | POA: Diagnosis not present

## 2019-05-14 DIAGNOSIS — N401 Enlarged prostate with lower urinary tract symptoms: Secondary | ICD-10-CM | POA: Diagnosis not present

## 2019-05-14 DIAGNOSIS — Z881 Allergy status to other antibiotic agents status: Secondary | ICD-10-CM | POA: Diagnosis not present

## 2019-05-14 DIAGNOSIS — N200 Calculus of kidney: Secondary | ICD-10-CM | POA: Diagnosis not present

## 2019-05-14 DIAGNOSIS — R351 Nocturia: Secondary | ICD-10-CM | POA: Diagnosis not present

## 2019-05-14 DIAGNOSIS — Z79899 Other long term (current) drug therapy: Secondary | ICD-10-CM | POA: Diagnosis not present

## 2019-05-14 DIAGNOSIS — N529 Male erectile dysfunction, unspecified: Secondary | ICD-10-CM | POA: Diagnosis not present

## 2019-05-14 DIAGNOSIS — I10 Essential (primary) hypertension: Secondary | ICD-10-CM | POA: Diagnosis not present

## 2019-05-29 ENCOUNTER — Other Ambulatory Visit: Payer: Self-pay

## 2019-05-29 ENCOUNTER — Ambulatory Visit (INDEPENDENT_AMBULATORY_CARE_PROVIDER_SITE_OTHER): Payer: PPO | Admitting: Family Medicine

## 2019-05-29 ENCOUNTER — Encounter: Payer: Self-pay | Admitting: Family Medicine

## 2019-05-29 VITALS — BP 140/80 | HR 90 | Temp 98.1°F | Resp 16 | Ht 75.0 in | Wt 228.0 lb

## 2019-05-29 DIAGNOSIS — R21 Rash and other nonspecific skin eruption: Secondary | ICD-10-CM | POA: Diagnosis not present

## 2019-05-29 MED ORDER — PREDNISONE 20 MG PO TABS: ORAL_TABLET | ORAL | 0 refills | Status: DC

## 2019-05-29 NOTE — Progress Notes (Signed)
Subjective:    Patient ID: Matthew Luna, male    DOB: 1940/04/22, 79 y.o.   MRN: [id number] 12/20/18     Patient thinks he has a sunburn.  He mowed the yard for 30 minutes 5 days ago.  The following day this rash appeared on the crown of his head.  It is isolated just to this area.  It does not extend down into the hair.  The rash is painful red and hot.  There are numerous small 1 mm pustules and vesicles.  The skin burns and stings.  He states that the day he mowed, he used [employer] to wash his hair.  The rash appeared the following day.  Therefore he believes the reaction is either an allergic reaction to [facility] or sunburn.  However the rash to me appears to be widespread shingles with a possible secondary cellulitis.  A chemical burn would also appear similar to this however I do not believe [facility] would likely cause a chemical burn and he is no longer using the product.  I do not believe that this is a sunburn given the length of exposure.  At that time, my plan was: Differential diagnosis includes burn, chemical burn/allergic reaction, shingles, cellulitis.  Given his presentation and symptoms I believe this is shingles with secondary cellulitis.  I will treat the patient with Valtrex 1 g p.o. 3 times daily for 7 days.  I will also treat the cellulitis with Keflex 500 mg p.o. 3 times daily for 7 days.  Recommended the patient apply Vaseline as a soothing ointment to help with the pain and blistering on his scalp.  Avoid contact with sun and [place].  Reassess in 48 hours sooner if worsening.  Decreased dose of gabapentin from 300 mg at night to 100 mg at night for neuropathy as 300 mg caused dizziness.  05/29/19 I did not hear back from the patient after his last visit in November.  He states that the rash went away.  He states that 3 days ago, the burning and the pain in the rash returned.   Picture does not do it just this.  The entire scalp is erythematous.  There is thick  vesicular-like crust widespread throughout the scalp.  It is only in the sun exposed area.  It does not progress into the hair.  It is located nowhere else on his body.  This certainly raises the concern of some type of photosensitive eruption.  Patient denies putting any kind of oral or cream on his scalp.  He did use some kind of dandruff shampoo prior to the eruption occurring again.  Past Medical History:  Diagnosis Date  . AAA (abdominal aortic aneurysm) ([place])   . Actinic keratosis   . Allergy    mild   . Anxiety   . CKD (chronic kidney disease) stage 3, GFR 30-59 ml/min   . Diverticulosis of colon (without mention of hemorrhage)   . Duodenitis without mention of hemorrhage   . Esophageal reflux   . Hypertrophy of prostate without urinary obstruction and other lower urinary tract symptoms (LUTS)   . Left sided ulcerative (chronic) colitis ([place])   . Mononeuritis of unspecified site   . Neuropathy   . Nonspecific abnormal results of thyroid function study   . Prostate cancer ([place])    cryotherapy 2011 [facility])  . Schatzki's ring   . Sleep apnea    does not use the CPAP  . Stricture and stenosis  of esophagus   . TIA (transient ischemic attack) 2017   pt states was told mini stroke  . Unspecified essential hypertension    Past Surgical History:  Procedure Laterality Date  . ABDOMINAL AORTIC ANEURYSM REPAIR     2018  . BALLOON DILATION  03/22/2011   Procedure: BALLOON DILATION;  Surgeon: Inda Castle, MD;  Location: [facility];  Service: Endoscopy;  Laterality: N/A;  kelly/ebp  . CHOLECYSTECTOMY    . COLONOSCOPY    . ENDOVASCULAR REPAIR/STENT GRAFT N/A 03/29/2016   Procedure: Endovascular Repair/Stent Graft;  Surgeon: Algernon Huxley, MD;  Location: [facility];  Service: Cardiovascular;  Laterality: N/A;  . ESOPHAGOGASTRODUODENOSCOPY  03/22/2011   Procedure: ESOPHAGOGASTRODUODENOSCOPY (EGD);  Surgeon: Inda Castle, MD;  Location: [facility];  Service: Endoscopy;   Laterality: N/A;  . PROSTATE SURGERY    . UPPER GASTROINTESTINAL ENDOSCOPY     Current Outpatient Medications on File Prior to Visit  Medication Sig Dispense Refill  . aspirin EC 81 MG tablet Take 1 tablet (81 mg total) by mouth daily. 30 tablet 11  . BYSTOLIC 10 MG tablet TAKE 1/2 TABLET (5 MG TOTAL) BY MOUTH AT BEDTIME. 45 tablet 3  . citalopram (CELEXA) 40 MG tablet TAKE 1 TABLET BY MOUTH EVERY DAY 90 tablet 3  . escitalopram (LEXAPRO) 10 MG tablet Take 1 tablet (10 mg total) by mouth daily. 90 tablet 3  . gabapentin (NEURONTIN) 100 MG capsule TAKE 1 CAPSULE BY MOUTH AT BEDTIME. 90 capsule 1  . imipramine (TOFRANIL) 25 MG tablet Take 25 mg by mouth at bedtime.    . mirabegron ER (MYRBETRIQ) 50 MG TB24 tablet Take 1 tablet (50 mg total) by mouth daily. 30 tablet 5  . omeprazole (PRILOSEC) 40 MG capsule TAKE 1 CAPSULE BY MOUTH EVERY DAY 90 capsule 1  . rosuvastatin (CRESTOR) 20 MG tablet TAKE 1 TABLET BY MOUTH EVERY DAY 90 tablet 1  . valACYclovir (VALTREX) 1000 MG tablet Take 1 tablet (1,000 mg total) by mouth 3 (three) times daily. 21 tablet 0   No current facility-administered medications on file prior to visit.   Allergies  Allergen Reactions  . Metronidazole Anaphylaxis  . Amoxicillin-Pot Clavulanate Other (See Comments)    UNKNOWN  . Ciprofloxacin Other (See Comments)    UNKNOWN  . Clindamycin/Lincomycin Other (See Comments)    Nausea, stomach pain, sweats  . Mesalamine     REACTION: Intolerance   Social History   Socioeconomic History  . Marital status: Married    Spouse name: Not on file  . Number of children: 1  . Years of education: Some colg  . Highest education level: Not on file  Occupational History  . Occupation: Retired   Tobacco Use  . Smoking status: Never Smoker  . Smokeless tobacco: Never Used  Substance and Sexual Activity  . Alcohol use: Yes    Alcohol/week: 14.0 standard drinks    Types: 14 Glasses of wine per week    Comment: per pt -drinks 10  oz red wine every night!  . Drug use: No  . Sexual activity: Never  Other Topics Concern  . Not on file  Social History Narrative   1 -2 cups of coffee a day, some tea consumption    Social Determinants of Health   Financial Resource Strain:   . Difficulty of Paying Living Expenses:   Food Insecurity:   . Worried About Charity fundraiser in the Last Year:   . [employer]  of Food in the Last Year:   Transportation Needs:   . Film/video editor (Medical):   Marland Kitchen Lack of Transportation (Non-Medical):   Physical Activity:   . Days of Exercise per Week:   . Minutes of Exercise per Session:   Stress:   . Feeling of Stress :   Social Connections:   . Frequency of Communication with Friends and Family:   . Frequency of Social Gatherings with Friends and Family:   . Attends Religious Services:   . Active Member of Clubs or Organizations:   . Attends Archivist Meetings:   Marland Kitchen Marital Status:   Intimate Partner Violence:   . Fear of Current or Ex-Partner:   . Emotionally Abused:   Marland Kitchen Physically Abused:   . Sexually Abused:       Review of Systems  All other systems reviewed and are negative.      Objective:   Physical Exam  Constitutional: He appears well-developed and well-nourished. No distress.  HENT:  Head:    Cardiovascular: Normal rate, regular rhythm and normal heart sounds. Exam reveals no gallop and no friction rub.  No murmur heard. Pulmonary/Chest: Effort normal and breath sounds normal. No respiratory distress. He has no wheezes. He has no rales.  Abdominal: Soft. Bowel sounds are normal.  Neurological: He has normal strength.  Skin: Rash noted. He is not diaphoretic. There is erythema.  Vitals reviewed.      Assessment & Plan:  Eruption of scalp  Patient is uncertain of whether the rash went away.  I find this concerning.  It would be impossible to not see this rash on your head every day.  To me it looks exactly as it did when I saw it for the  first time in November.  Therefore I do not believe that this is shingles or cellulitis.  I believe my initial assessment was wrong.  I am certainly concerned about a photosensitive drug eruption, an allergic reaction to some topical product, a chemical burn, or malignancy.  I will treat the patient with prednisone.  This should certainly help photosensitive drug eruption/allergic reaction as well as a chemical burn.  If persistent, I would recommend a biopsy/referral to dermatology.  I will see the patient back in 1 week to reassess.  However I reviewed all of his medications and none of his medications appear to be a photosensitizing medication.

## 2019-06-05 ENCOUNTER — Ambulatory Visit (INDEPENDENT_AMBULATORY_CARE_PROVIDER_SITE_OTHER): Payer: PPO | Admitting: Family Medicine

## 2019-06-05 ENCOUNTER — Other Ambulatory Visit: Payer: Self-pay

## 2019-06-05 ENCOUNTER — Encounter: Payer: Self-pay | Admitting: Family Medicine

## 2019-06-05 VITALS — BP 142/82 | HR 56 | Temp 96.9°F | Resp 18 | Ht 74.0 in | Wt 225.0 lb

## 2019-06-05 DIAGNOSIS — R21 Rash and other nonspecific skin eruption: Secondary | ICD-10-CM | POA: Diagnosis not present

## 2019-06-05 MED ORDER — CLOBETASOL PROPIONATE 0.05 % EX CREA: 1.0000 "application " | TOPICAL_CREAM | Freq: Two times a day (BID) | CUTANEOUS | 3 refills | Status: DC

## 2019-06-05 NOTE — Progress Notes (Signed)
Subjective:    Patient ID: Matthew Luna, male    DOB: 04/01/1940, 79 y.o.   MRN: [id number] 12/20/18     Patient thinks he has a sunburn.  He mowed the yard for 30 minutes 5 days ago.  The following day this rash appeared on the crown of his head.  It is isolated just to this area.  It does not extend down into the hair.  The rash is painful red and hot.  There are numerous small 1 mm pustules and vesicles.  The skin burns and stings.  He states that the day he mowed, he used [employer] to wash his hair.  The rash appeared the following day.  Therefore he believes the reaction is either an allergic reaction to [facility] or sunburn.  However the rash to me appears to be widespread shingles with a possible secondary cellulitis.  A chemical burn would also appear similar to this however I do not believe [facility] would likely cause a chemical burn and he is no longer using the product.  I do not believe that this is a sunburn given the length of exposure.  At that time, my plan was: Differential diagnosis includes burn, chemical burn/allergic reaction, shingles, cellulitis.  Given his presentation and symptoms I believe this is shingles with secondary cellulitis.  I will treat the patient with Valtrex 1 g p.o. 3 times daily for 7 days.  I will also treat the cellulitis with Keflex 500 mg p.o. 3 times daily for 7 days.  Recommended the patient apply Vaseline as a soothing ointment to help with the pain and blistering on his scalp.  Avoid contact with sun and [place].  Reassess in 48 hours sooner if worsening.  Decreased dose of gabapentin from 300 mg at night to 100 mg at night for neuropathy as 300 mg caused dizziness.  05/29/19 I did not hear back from the patient after his last visit in November.  He states that the rash went away.  He states that 3 days ago, the burning and the pain in the rash returned.   Picture does not do it just this.  The entire scalp is erythematous.  There is thick  vesicular-like crust widespread throughout the scalp.  It is only in the sun exposed area.  It does not progress into the hair.  It is located nowhere else on his body.  This certainly raises the concern of some type of photosensitive eruption.  Patient denies putting any kind of oral or cream on his scalp.  He did use some kind of dandruff shampoo prior to the eruption occurring again.  At that time, my plan was: Patient is uncertain of whether the rash went away.  I find this concerning.  It would be impossible to not see this rash on your head every day.  To me it looks exactly as it did when I saw it for the first time in November.  Therefore I do not believe that this is shingles or cellulitis.  I believe my initial assessment was wrong.  I am certainly concerned about a photosensitive drug eruption, an allergic reaction to some topical product, a chemical burn, or malignancy.  I will treat the patient with prednisone.  This should certainly help photosensitive drug eruption/allergic reaction as well as a chemical burn.  If persistent, I would recommend a biopsy/referral to dermatology.  I will see the patient back in 1 week to reassess.  However I reviewed  all of his medications and none of his medications appear to be a photosensitizing medication.  06/05/19   The erythema has faded completely from his scalp.  He does have hyperkeratosis and peeling dry skin that is brown and hyperpigmented all over his entire scalp but the erythema is much better.  Patient is convinced that this is some type of allergic reaction to his shampoo.  He states that this is happened twice now.  He believes now that the rash did go away when I saw him initially in November.  Past Medical History:  Diagnosis Date  . AAA (abdominal aortic aneurysm) ([place])   . Actinic keratosis   . Allergy    mild   . Anxiety   . CKD (chronic kidney disease) stage 3, GFR 30-59 ml/min   . Diverticulosis of colon (without mention of  hemorrhage)   . Duodenitis without mention of hemorrhage   . Esophageal reflux   . Hypertrophy of prostate without urinary obstruction and other lower urinary tract symptoms (LUTS)   . Left sided ulcerative (chronic) colitis ([place])   . Mononeuritis of unspecified site   . Neuropathy   . Nonspecific abnormal results of thyroid function study   . Prostate cancer ([place])    cryotherapy 2011 [facility])  . Schatzki's ring   . Sleep apnea    does not use the CPAP  . Stricture and stenosis of esophagus   . TIA (transient ischemic attack) 2017   pt states was told mini stroke  . Unspecified essential hypertension    Past Surgical History:  Procedure Laterality Date  . ABDOMINAL AORTIC ANEURYSM REPAIR     2018  . BALLOON DILATION  03/22/2011   Procedure: BALLOON DILATION;  Surgeon: Inda Castle, MD;  Location: [facility];  Service: Endoscopy;  Laterality: N/A;  kelly/ebp  . CHOLECYSTECTOMY    . COLONOSCOPY    . ENDOVASCULAR REPAIR/STENT GRAFT N/A 03/29/2016   Procedure: Endovascular Repair/Stent Graft;  Surgeon: Algernon Huxley, MD;  Location: [place] [facility];  Service: Cardiovascular;  Laterality: N/A;  . ESOPHAGOGASTRODUODENOSCOPY  03/22/2011   Procedure: ESOPHAGOGASTRODUODENOSCOPY (EGD);  Surgeon: Inda Castle, MD;  Location: [facility];  Service: Endoscopy;  Laterality: N/A;  . PROSTATE SURGERY    . UPPER GASTROINTESTINAL ENDOSCOPY     Current Outpatient Medications on File Prior to Visit  Medication Sig Dispense Refill  . aspirin EC 81 MG tablet Take 1 tablet (81 mg total) by mouth daily. 30 tablet 11  . BYSTOLIC 10 MG tablet TAKE 1/2 TABLET (5 MG TOTAL) BY MOUTH AT BEDTIME. 45 tablet 3  . citalopram (CELEXA) 40 MG tablet TAKE 1 TABLET BY MOUTH EVERY DAY 90 tablet 3  . escitalopram (LEXAPRO) 10 MG tablet Take 1 tablet (10 mg total) by mouth daily. 90 tablet 3  . gabapentin (NEURONTIN) 100 MG capsule TAKE 1 CAPSULE BY MOUTH AT BEDTIME. 90 capsule 1  . imipramine (TOFRANIL) 25 MG  tablet Take 25 mg by mouth at bedtime.    . mirabegron ER (MYRBETRIQ) 50 MG TB24 tablet Take 1 tablet (50 mg total) by mouth daily. 30 tablet 5  . omeprazole (PRILOSEC) 40 MG capsule TAKE 1 CAPSULE BY MOUTH EVERY DAY 90 capsule 1  . rosuvastatin (CRESTOR) 20 MG tablet TAKE 1 TABLET BY MOUTH EVERY DAY 90 tablet 1  . valACYclovir (VALTREX) 1000 MG tablet Take 1 tablet (1,000 mg total) by mouth 3 (three) times daily. 21 tablet 0   No current facility-administered medications on  file prior to visit.   Allergies  Allergen Reactions  . Metronidazole Anaphylaxis  . Amoxicillin-Pot Clavulanate Other (See Comments)    UNKNOWN  . Ciprofloxacin Other (See Comments)    UNKNOWN  . Clindamycin/Lincomycin Other (See Comments)    Nausea, stomach pain, sweats  . Mesalamine     REACTION: Intolerance   Social History   Socioeconomic History  . Marital status: Married    Spouse name: Not on file  . Number of children: 1  . Years of education: Some colg  . Highest education level: Not on file  Occupational History  . Occupation: Retired   Tobacco Use  . Smoking status: Never Smoker  . Smokeless tobacco: Never Used  Substance and Sexual Activity  . Alcohol use: Yes    Alcohol/week: 14.0 standard drinks    Types: 14 Glasses of wine per week    Comment: per pt -drinks 10 oz red wine every night!  . Drug use: No  . Sexual activity: Never  Other Topics Concern  . Not on file  Social History Narrative   1 -2 cups of coffee a day, some tea consumption    Social Determinants of Health   Financial Resource Strain:   . Difficulty of Paying Living Expenses:   Food Insecurity:   . Worried About Charity fundraiser in the Last Year:   . Arboriculturist in the Last Year:   Transportation Needs:   . Film/video editor (Medical):   Marland Kitchen Lack of Transportation (Non-Medical):   Physical Activity:   . Days of Exercise per Week:   . Minutes of Exercise per Session:   Stress:   . Feeling of Stress  :   Social Connections:   . Frequency of Communication with Friends and Family:   . Frequency of Social Gatherings with Friends and Family:   . Attends Religious Services:   . Active Member of Clubs or Organizations:   . Attends Archivist Meetings:   Marland Kitchen Marital Status:   Intimate Partner Violence:   . Fear of Current or Ex-Partner:   . Emotionally Abused:   Marland Kitchen Physically Abused:   . Sexually Abused:       Review of Systems  All other systems reviewed and are negative.      Objective:   Physical Exam  Constitutional: He appears well-developed and well-nourished. No distress.  HENT:  Head:    Cardiovascular: Normal rate, regular rhythm and normal heart sounds. Exam reveals no gallop and no friction rub.  No murmur heard. Pulmonary/Chest: Effort normal and breath sounds normal. No respiratory distress. He has no wheezes. He has no rales.  Abdominal: Soft. Bowel sounds are normal.  Neurological: He has normal strength.  Skin: Rash noted. He is not diaphoretic. There is erythema.  Vitals reviewed.      Assessment & Plan:  Eruption of scalp  Erythema is much better however he continues to have hyperkeratotic skin changes all throughout the scalp and peeling dry skin similar to a severe sunburn.  I will give the patient clobetasol cream to apply twice a day for 10 days and then recheck the patient in 10 days.  Avoid use of that shampoo in the future.  If persistent in 10 days, I would recommend dermatology consultation.

## 2019-07-01 ENCOUNTER — Encounter (INDEPENDENT_AMBULATORY_CARE_PROVIDER_SITE_OTHER): Payer: Self-pay

## 2019-07-01 ENCOUNTER — Other Ambulatory Visit (INDEPENDENT_AMBULATORY_CARE_PROVIDER_SITE_OTHER): Payer: PPO

## 2019-07-01 ENCOUNTER — Ambulatory Visit (INDEPENDENT_AMBULATORY_CARE_PROVIDER_SITE_OTHER): Payer: PPO | Admitting: Vascular Surgery

## 2019-08-12 ENCOUNTER — Ambulatory Visit (INDEPENDENT_AMBULATORY_CARE_PROVIDER_SITE_OTHER): Payer: PPO | Admitting: Family Medicine

## 2019-08-12 ENCOUNTER — Other Ambulatory Visit: Payer: Self-pay

## 2019-08-12 VITALS — BP 140/82 | HR 80 | Temp 97.3°F | Ht 74.0 in | Wt 223.0 lb

## 2019-08-12 DIAGNOSIS — R399 Unspecified symptoms and signs involving the genitourinary system: Secondary | ICD-10-CM | POA: Diagnosis not present

## 2019-08-12 DIAGNOSIS — M7989 Other specified soft tissue disorders: Secondary | ICD-10-CM

## 2019-08-12 NOTE — Progress Notes (Signed)
Subjective:    Patient ID: Matthew Luna, male    DOB: 05-Feb-1940, 79 y.o.   MRN: [id number]  Patient is a very pleasant 79 year old Caucasian male who presents today with swelling in his legs.  He has past medical history of chronic kidney disease with a baseline creatinine of 1.7.  He is also on gabapentin for restless leg and peripheral neuropathy.  He also tends to eat a diet high in sodium although he denies this today.  He reports swelling in his legs.  He has trace to +1 edema in his feet and in his lower shins up to his mid shins bilaterally.  He denies any chest pain or shortness of breath or dyspnea on exertion.  He denies any increased sodium intake.  He denies any orthopnea or paroxysmal nocturnal dyspnea.  He denies any oliguria although he is long overdue for lab work.  His weight has remained stable and is actually down 3 pounds from his last visit and therefore I do not feel that he is demonstrating fluid overload.  Past Medical History:  Diagnosis Date  . AAA (abdominal aortic aneurysm) ([place])   . Actinic keratosis   . Allergy    mild   . Anxiety   . CKD (chronic kidney disease) stage 3, GFR 30-59 ml/min   . Diverticulosis of colon (without mention of hemorrhage)   . Duodenitis without mention of hemorrhage   . Esophageal reflux   . Hypertrophy of prostate without urinary obstruction and other lower urinary tract symptoms (LUTS)   . Left sided ulcerative (chronic) colitis ([place])   . Mononeuritis of unspecified site   . Neuropathy   . Nonspecific abnormal results of thyroid function study   . Prostate cancer ([place])    cryotherapy 2011 [facility])  . Schatzki's ring   . Sleep apnea    does not use the CPAP  . Stricture and stenosis of esophagus   . TIA (transient ischemic attack) 2017   pt states was told mini stroke  . Unspecified essential hypertension    Past Surgical History:  Procedure Laterality Date  . ABDOMINAL AORTIC ANEURYSM REPAIR     2018  . BALLOON DILATION   03/22/2011   Procedure: BALLOON DILATION;  Surgeon: Inda Castle, MD;  Location: [facility];  Service: Endoscopy;  Laterality: N/A;  kelly/ebp  . CHOLECYSTECTOMY    . COLONOSCOPY    . ENDOVASCULAR REPAIR/STENT GRAFT N/A 03/29/2016   Procedure: Endovascular Repair/Stent Graft;  Surgeon: Algernon Huxley, MD;  Location: [place] [facility];  Service: Cardiovascular;  Laterality: N/A;  . ESOPHAGOGASTRODUODENOSCOPY  03/22/2011   Procedure: ESOPHAGOGASTRODUODENOSCOPY (EGD);  Surgeon: Inda Castle, MD;  Location: [facility];  Service: Endoscopy;  Laterality: N/A;  . PROSTATE SURGERY    . UPPER GASTROINTESTINAL ENDOSCOPY     Current Outpatient Medications on File Prior to Visit  Medication Sig Dispense Refill  . aspirin EC 81 MG tablet Take 1 tablet (81 mg total) by mouth daily. 30 tablet 11  . BYSTOLIC 10 MG tablet TAKE 1/2 TABLET (5 MG TOTAL) BY MOUTH AT BEDTIME. 45 tablet 3  . citalopram (CELEXA) 40 MG tablet TAKE 1 TABLET BY MOUTH EVERY DAY 90 tablet 3  . clobetasol cream (TEMOVATE) 2.26 % Apply 1 application topically 2 (two) times daily. 45 g 3  . escitalopram (LEXAPRO) 10 MG tablet Take 1 tablet (10 mg total) by mouth daily. 90 tablet 3  . gabapentin (NEURONTIN) 100 MG capsule TAKE 1 CAPSULE BY  MOUTH AT BEDTIME. 90 capsule 1  . imipramine (TOFRANIL) 25 MG tablet Take 25 mg by mouth at bedtime.    . mirabegron ER (MYRBETRIQ) 50 MG TB24 tablet Take 1 tablet (50 mg total) by mouth daily. 30 tablet 5  . omeprazole (PRILOSEC) 40 MG capsule TAKE 1 CAPSULE BY MOUTH EVERY DAY 90 capsule 1  . rosuvastatin (CRESTOR) 20 MG tablet TAKE 1 TABLET BY MOUTH EVERY DAY 90 tablet 1  . valACYclovir (VALTREX) 1000 MG tablet Take 1 tablet (1,000 mg total) by mouth 3 (three) times daily. 21 tablet 0   No current facility-administered medications on file prior to visit.   Allergies  Allergen Reactions  . Metronidazole Anaphylaxis  . Amoxicillin-Pot Clavulanate Other (See Comments)    UNKNOWN  .  Ciprofloxacin Other (See Comments)    UNKNOWN  . Clindamycin/Lincomycin Other (See Comments)    Nausea, stomach pain, sweats  . Mesalamine     REACTION: Intolerance   Social History   Socioeconomic History  . Marital status: Married    Spouse name: Not on file  . Number of children: 1  . Years of education: Some colg  . Highest education level: Not on file  Occupational History  . Occupation: Retired   Tobacco Use  . Smoking status: Never Smoker  . Smokeless tobacco: Never Used  Vaping Use  . Vaping Use: Never used  Substance and Sexual Activity  . Alcohol use: Yes    Alcohol/week: 14.0 standard drinks    Types: 14 Glasses of wine per week    Comment: per pt -drinks 10 oz red wine every night!  . Drug use: No  . Sexual activity: Never  Other Topics Concern  . Not on file  Social History Narrative   1 -2 cups of coffee a day, some tea consumption    Social Determinants of Health   Financial Resource Strain:   . Difficulty of Paying Living Expenses:   Food Insecurity:   . Worried About Charity fundraiser in the Last Year:   . Arboriculturist in the Last Year:   Transportation Needs:   . Film/video editor (Medical):   Marland Kitchen Lack of Transportation (Non-Medical):   Physical Activity:   . Days of Exercise per Week:   . Minutes of Exercise per Session:   Stress:   . Feeling of Stress :   Social Connections:   . Frequency of Communication with Friends and Family:   . Frequency of Social Gatherings with Friends and Family:   . Attends Religious Services:   . Active Member of Clubs or Organizations:   . Attends Archivist Meetings:   Marland Kitchen Marital Status:   Intimate Partner Violence:   . Fear of Current or Ex-Partner:   . Emotionally Abused:   Marland Kitchen Physically Abused:   . Sexually Abused:       Review of Systems  All other systems reviewed and are negative.      Objective:   Physical Exam Vitals reviewed.  Constitutional:      General: He is not in  acute distress.    Appearance: He is well-developed. He is not diaphoretic.  Neck:     Thyroid: No thyromegaly.     Vascular: No JVD.  Cardiovascular:     Rate and Rhythm: Normal rate and regular rhythm.     Heart sounds: Normal heart sounds. No murmur heard.  No friction rub. No gallop.   Pulmonary:  Effort: Pulmonary effort is normal. No respiratory distress.     Breath sounds: Normal breath sounds. No wheezing or rales.  Abdominal:     General: Bowel sounds are normal. There is no distension.     Palpations: Abdomen is soft. There is no mass.     Tenderness: There is no guarding or rebound.  Genitourinary:    Prostate: Normal. Not enlarged and not tender.  Lymphadenopathy:     Cervical: No cervical adenopathy.  Neurological:     Mental Status: He is alert and oriented to person, place, and time.     Cranial Nerves: No cranial nerve deficit.     Sensory: Sensory deficit present.     Motor: No abnormal muscle tone.     Coordination: Coordination normal.     Deep Tendon Reflexes: Reflexes are normal and symmetric.           Assessment & Plan:  Leg swelling - Plan: CBC with Differential/Platelet, COMPLETE METABOLIC PANEL WITH GFR, Brain natriuretic peptide  Lower urinary tract symptoms (LUTS) - Plan: PSA  I suspect that the patient's lower leg swelling is multifactorial and is likely due to a combination of gabapentin, chronic kidney disease, chronic venous insufficiency, and sodium in the diet.  I would like to check a BNP to evaluate for any evidence of fluid overload from heart failure.  I will also like to monitor his renal function.  If labs are normal I would recommend a low-dose Lasix as needed for leg swelling, elevate legs, compression hose, and a low-sodium diet.  Patient also endorses increased nocturia 3 episodes in evening.  He has not seen his urologist at [facility] in quite some time and therefore we will check a PSA today to evaluate for any evidence of worsening  BPH that could potentially cause an obstructive nephropathy

## 2019-08-13 LAB — COMPLETE METABOLIC PANEL WITH GFR
AG Ratio: 1.6 (calc) (ref 1.0–2.5)
ALT: 10 U/L (ref 9–46)
AST: 22 U/L (ref 10–35)
Albumin: 4 g/dL (ref 3.6–5.1)
Alkaline phosphatase (APISO): 69 U/L (ref 35–144)
BUN/Creatinine Ratio: 13 (calc) (ref 6–22)
BUN: 22 mg/dL (ref 7–25)
CO2: 27 mmol/L (ref 20–32)
Calcium: 9.3 mg/dL (ref 8.6–10.3)
Chloride: 106 mmol/L (ref 98–110)
Creat: 1.65 mg/dL — ABNORMAL HIGH (ref 0.70–1.18)
GFR, Est African American: 46 mL/min/{1.73_m2} — ABNORMAL LOW (ref >=60)
GFR, Est Non African American: 39 mL/min/{1.73_m2} — ABNORMAL LOW (ref >=60)
Globulin: 2.5 g/dL (calc) (ref 1.9–3.7)
Glucose, Bld: 101 mg/dL — ABNORMAL HIGH (ref 65–99)
Potassium: 4.3 mmol/L (ref 3.5–5.3)
Sodium: 142 mmol/L (ref 135–146)
Total Bilirubin: 1.1 mg/dL (ref 0.2–1.2)
Total Protein: 6.5 g/dL (ref 6.1–8.1)

## 2019-08-13 LAB — CBC WITH DIFFERENTIAL/PLATELET
Absolute Monocytes: 466 cells/uL (ref 200–950)
Basophils Absolute: 67 cells/uL (ref 0–200)
Basophils Relative: 0.9 %
Eosinophils Absolute: 481 cells/uL (ref 15–500)
Eosinophils Relative: 6.5 %
HCT: 47.4 % (ref 38.5–50.0)
Hemoglobin: 16.4 g/dL (ref 13.2–17.1)
Lymphs Abs: 2146 cells/uL (ref 850–3900)
MCH: 34.4 pg — ABNORMAL HIGH (ref 27.0–33.0)
MCHC: 34.6 g/dL (ref 32.0–36.0)
MCV: 99.4 fL (ref 80.0–100.0)
MPV: 10.2 fL (ref 7.5–12.5)
Monocytes Relative: 6.3 %
Neutro Abs: 4240 cells/uL (ref 1500–7800)
Neutrophils Relative %: 57.3 %
Platelets: 212 10*3/uL (ref 140–400)
RBC: 4.77 10*6/uL (ref 4.20–5.80)
RDW: 13.2 % (ref 11.0–15.0)
Total Lymphocyte: 29 %
WBC: 7.4 10*3/uL (ref 3.8–10.8)

## 2019-08-13 LAB — PSA: PSA: <0.1 ng/mL (ref <=4.0)

## 2019-08-13 LAB — BRAIN NATRIURETIC PEPTIDE: Brain Natriuretic Peptide: 108 pg/mL — ABNORMAL HIGH (ref <100)

## 2019-08-26 ENCOUNTER — Other Ambulatory Visit: Payer: Self-pay

## 2019-09-22 ENCOUNTER — Other Ambulatory Visit: Payer: Self-pay | Admitting: Family Medicine

## 2019-09-22 DIAGNOSIS — G629 Polyneuropathy, unspecified: Secondary | ICD-10-CM | POA: Diagnosis not present

## 2019-10-20 ENCOUNTER — Other Ambulatory Visit: Payer: Self-pay

## 2019-10-20 ENCOUNTER — Telehealth (INDEPENDENT_AMBULATORY_CARE_PROVIDER_SITE_OTHER): Payer: PPO | Admitting: Family Medicine

## 2019-10-20 DIAGNOSIS — J310 Chronic rhinitis: Secondary | ICD-10-CM

## 2019-10-20 DIAGNOSIS — J329 Chronic sinusitis, unspecified: Secondary | ICD-10-CM

## 2019-10-20 MED ORDER — CEFDINIR 300 MG PO CAPS: 300.0000 mg | ORAL_CAPSULE | Freq: Two times a day (BID) | ORAL | 0 refills | Status: DC

## 2019-10-20 NOTE — Progress Notes (Signed)
Subjective:    Patient ID: Matthew Luna, male    DOB: 04/16/1940, 79 y.o.   MRN: [id number]  HPI Patient is being seen today as a telephone visit.  Phone call began at 2:00.  Phone call concluded at 210.  Patient consents to be seen via telephone.  He is currently at home.  I am currently in my office.  He states that he is been feeling extremely tired for the last 2 days.  He reports a headache with pain and pressure behind both eyes and in both cheeks.  He reports sinus congestion.  He reports rhinorrhea.  He reports dizziness.  He reports a dull headache.  He denies any meningismus.  He denies any rash.  He denies any high fever.  Mainly his head congestion and rhinorrhea.  He denies any cough.  He denies any shortness of breath.  He had both doses of his Covid vaccine. Past Medical History:  Diagnosis Date   AAA (abdominal aortic aneurysm) ([facility])    Actinic keratosis    Allergy    mild    Anxiety    CKD (chronic kidney disease) stage 3, GFR 30-59 ml/min    Diverticulosis of colon (without mention of hemorrhage)    Duodenitis without mention of hemorrhage    Esophageal reflux    Hypertrophy of prostate without urinary obstruction and other lower urinary tract symptoms (LUTS)    Left sided ulcerative (chronic) colitis ([facility])    Mononeuritis of unspecified site    Neuropathy    Nonspecific abnormal results of thyroid function study    Prostate cancer ([place])    cryotherapy 2011 ([facility])   Schatzki's ring    Sleep apnea    does not use the CPAP   Stricture and stenosis of esophagus    TIA (transient ischemic attack) 2017   pt states was told mini stroke   Unspecified essential hypertension    Past Surgical History:  Procedure Laterality Date   ABDOMINAL AORTIC ANEURYSM REPAIR     2018   BALLOON DILATION  03/22/2011   Procedure: BALLOON DILATION;  Surgeon: Inda Castle, MD;  Location: [facility];  Service: Endoscopy;  Laterality: N/A;  kelly/ebp    CHOLECYSTECTOMY     COLONOSCOPY     ENDOVASCULAR REPAIR/STENT GRAFT N/A 03/29/2016   Procedure: Endovascular Repair/Stent Graft;  Surgeon: Algernon Huxley, MD;  Location: [place] [facility];  Service: Cardiovascular;  Laterality: N/A;   ESOPHAGOGASTRODUODENOSCOPY  03/22/2011   Procedure: ESOPHAGOGASTRODUODENOSCOPY (EGD);  Surgeon: Inda Castle, MD;  Location: [facility];  Service: Endoscopy;  Laterality: N/A;   PROSTATE SURGERY     UPPER GASTROINTESTINAL ENDOSCOPY     Current Outpatient Medications on File Prior to Visit  Medication Sig Dispense Refill   aspirin EC 81 MG tablet Take 1 tablet (81 mg total) by mouth daily. 30 tablet 11   BYSTOLIC 10 MG tablet TAKE 1/2 TABLET (5 MG TOTAL) BY MOUTH AT BEDTIME. 45 tablet 3   citalopram (CELEXA) 40 MG tablet TAKE 1 TABLET BY MOUTH EVERY DAY 90 tablet 3   clobetasol cream (TEMOVATE) 8.54 % Apply 1 application topically 2 (two) times daily. 45 g 3   escitalopram (LEXAPRO) 10 MG tablet TAKE 1 TABLET BY MOUTH EVERY DAY 90 tablet 3   gabapentin (NEURONTIN) 100 MG capsule TAKE 1 CAPSULE BY MOUTH AT BEDTIME. 90 capsule 1   imipramine (TOFRANIL) 25 MG tablet Take 25 mg by mouth at bedtime.     MYRBETRIQ  50 MG TB24 tablet TAKE 1 TABLET BY MOUTH EVERY DAY 30 tablet 5   omeprazole (PRILOSEC) 40 MG capsule TAKE 1 CAPSULE BY MOUTH EVERY DAY 90 capsule 1   rosuvastatin (CRESTOR) 20 MG tablet TAKE 1 TABLET BY MOUTH EVERY DAY 90 tablet 1   valACYclovir (VALTREX) 1000 MG tablet Take 1 tablet (1,000 mg total) by mouth 3 (three) times daily. 21 tablet 0   No current facility-administered medications on file prior to visit.   Allergies  Allergen Reactions   Metronidazole Anaphylaxis   Amoxicillin-Pot Clavulanate Other (See Comments)    UNKNOWN   Ciprofloxacin Other (See Comments)    UNKNOWN   Clindamycin/Lincomycin Other (See Comments)    Nausea, stomach pain, sweats   Mesalamine     REACTION: Intolerance   Social History    Socioeconomic History   Marital status: Married    Spouse name: Not on file   Number of children: 1   Years of education: Some colg   Highest education level: Not on file  Occupational History   Occupation: Retired   Tobacco Use   Smoking status: Never Smoker   Smokeless tobacco: Never Used  Scientific laboratory technician Use: Never used  Substance and Sexual Activity   Alcohol use: Yes    Alcohol/week: 14.0 standard drinks    Types: 14 Glasses of wine per week    Comment: per pt -drinks 10 oz red wine every night!   Drug use: No   Sexual activity: Never  Other Topics Concern   Not on file  Social History Narrative   1 -2 cups of coffee a day, some tea consumption    Social Determinants of Health   Financial Resource Strain:    Difficulty of Paying Living Expenses: Not on file  Food Insecurity:    Worried About [place] in the Last Year: Not on file   Ran Out of Food in the Last Year: Not on file  Transportation Needs:    Lack of Transportation (Medical): Not on file   Lack of Transportation (Non-Medical): Not on file  Physical Activity:    Days of Exercise per Week: Not on file   Minutes of Exercise per Session: Not on file  Stress:    Feeling of Stress : Not on file  Social Connections:    Frequency of Communication with Friends and Family: Not on file   Frequency of Social Gatherings with Friends and Family: Not on file   Attends Religious Services: Not on file   Active Member of Clubs or Organizations: Not on file   Attends Archivist Meetings: Not on file   Marital Status: Not on file  Intimate Partner Violence:    Fear of Current or Ex-Partner: Not on file   Emotionally Abused: Not on file   Physically Abused: Not on file   Sexually Abused: Not on file      Review of Systems  All other systems reviewed and are negative.      Objective:   Physical Exam        Assessment & Plan:  Rhinosinusitis -  Plan: SARS-COV-2 RNA,(COVID-19) QUAL NAAT  Patient has rhinosinusitis based on his description although we are limited by telephone visit.  I recommended that he come by to get screened for Covid given his symptoms however I will treat the patient empirically with Omnicef 300 mg p.o. twice daily for 10 days.

## 2019-10-21 LAB — SARS-COV-2 RNA,(COVID-19) QUALITATIVE NAAT: SARS CoV2 RNA: NOT DETECTED

## 2019-10-24 ENCOUNTER — Other Ambulatory Visit: Payer: Self-pay

## 2019-10-24 ENCOUNTER — Ambulatory Visit (INDEPENDENT_AMBULATORY_CARE_PROVIDER_SITE_OTHER): Payer: PPO | Admitting: Family Medicine

## 2019-10-24 ENCOUNTER — Encounter: Payer: Self-pay | Admitting: Family Medicine

## 2019-10-24 VITALS — BP 130/80 | HR 64 | Temp 97.4°F | Ht 74.0 in | Wt 220.0 lb

## 2019-10-24 DIAGNOSIS — I6930 Unspecified sequelae of cerebral infarction: Secondary | ICD-10-CM

## 2019-10-24 DIAGNOSIS — G119 Hereditary ataxia, unspecified: Secondary | ICD-10-CM | POA: Diagnosis not present

## 2019-10-24 DIAGNOSIS — I63349 Cerebral infarction due to thrombosis of unspecified cerebellar artery: Secondary | ICD-10-CM

## 2019-10-24 NOTE — Progress Notes (Signed)
Subjective:    Patient ID: Matthew Luna, male    DOB: 1940-10-01, 79 y.o.   MRN: [id number]  Patient had an MRA of the brain along with an MRI of the brain performed in 05/2014.  The results are dictated below:   IMPRESSION: MRI HEAD: No acute intracranial process.  Involutional changes. Mild to moderate white matter changes suggest chronic small vessel ischemic disease.  MRA HEAD: High-grade stenosis versus occlusion of proximal RIGHT P1 segment, mid to high-grade stenosis LEFT proximal P2 segment.  Atherosclerosis/stenosis of bilateral superior cerebellar arteries, possible occlusion on the LEFT.  Mild stenosis RIGHT supraclinoid internal carotid artery.  10/24/19  Patient is not sure when but at some point he stopped taking his aspirin and his Crestor.  He is here today because in the last week he has become progressively off balance.  He states that when he stands up or when he tries to walk he feels unsteady on his feet.  He also complains of some blurry vision primarily in his left eye.  He had an eye exam 6 months ago that was normal.  He denies any unilateral weakness, slurred speech, facial droop.  He denies any memory loss.  There is no weakness on his exam today and there is no hyperreflexia or spasticity.  He is able to perform Romberg testing.  He is able to perform finger-to-nose testing.  However heel-to-toe walk is grossly abnormal.  The patient cannot take 1 step heel-to-toe without falling to the side in a dramatic fashion.  He is unable to walk in a straight line.  He demonstrates ataxia unless he walks with a wide-based gait.  He denies any vertigo.  Dix-Hallpike maneuver is negative  Past Medical History:  Diagnosis Date  . AAA (abdominal aortic aneurysm) ([place])   . Actinic keratosis   . Allergy    mild   . Anxiety   . CKD (chronic kidney disease) stage 3, GFR 30-59 ml/min   . Diverticulosis of colon (without mention of hemorrhage)   . Duodenitis without  mention of hemorrhage   . Esophageal reflux   . Hypertrophy of prostate without urinary obstruction and other lower urinary tract symptoms (LUTS)   . Left sided ulcerative (chronic) colitis ([place])   . Mononeuritis of unspecified site   . Neuropathy   . Nonspecific abnormal results of thyroid function study   . Prostate cancer ([place])    cryotherapy 2011 [facility])  . Schatzki's ring   . Sleep apnea    does not use the CPAP  . Stricture and stenosis of esophagus   . TIA (transient ischemic attack) 2017   pt states was told mini stroke  . Unspecified essential hypertension    Past Surgical History:  Procedure Laterality Date  . ABDOMINAL AORTIC ANEURYSM REPAIR     2018  . BALLOON DILATION  03/22/2011   Procedure: BALLOON DILATION;  Surgeon: Inda Castle, MD;  Location: [facility];  Service: Endoscopy;  Laterality: N/A;  kelly/ebp  . CHOLECYSTECTOMY    . COLONOSCOPY    . ENDOVASCULAR REPAIR/STENT GRAFT N/A 03/29/2016   Procedure: Endovascular Repair/Stent Graft;  Surgeon: Algernon Huxley, MD;  Location: [facility];  Service: Cardiovascular;  Laterality: N/A;  . ESOPHAGOGASTRODUODENOSCOPY  03/22/2011   Procedure: ESOPHAGOGASTRODUODENOSCOPY (EGD);  Surgeon: Inda Castle, MD;  Location: [facility];  Service: Endoscopy;  Laterality: N/A;  . PROSTATE SURGERY    . UPPER GASTROINTESTINAL ENDOSCOPY     Current Outpatient Medications on  File Prior to Visit  Medication Sig Dispense Refill  . aspirin EC 81 MG tablet Take 1 tablet (81 mg total) by mouth daily. 30 tablet 11  . BYSTOLIC 10 MG tablet TAKE 1/2 TABLET (5 MG TOTAL) BY MOUTH AT BEDTIME. 45 tablet 3  . cefdinir (OMNICEF) 300 MG capsule Take 1 capsule (300 mg total) by mouth 2 (two) times daily. 20 capsule 0  . citalopram (CELEXA) 40 MG tablet TAKE 1 TABLET BY MOUTH EVERY DAY 90 tablet 3  . clobetasol cream (TEMOVATE) 5.09 % Apply 1 application topically 2 (two) times daily. 45 g 3  . escitalopram (LEXAPRO) 10 MG tablet TAKE 1  TABLET BY MOUTH EVERY DAY 90 tablet 3  . gabapentin (NEURONTIN) 100 MG capsule TAKE 1 CAPSULE BY MOUTH AT BEDTIME. 90 capsule 1  . imipramine (TOFRANIL) 25 MG tablet Take 25 mg by mouth at bedtime.    Marland Kitchen MYRBETRIQ 50 MG TB24 tablet TAKE 1 TABLET BY MOUTH EVERY DAY 30 tablet 5  . omeprazole (PRILOSEC) 40 MG capsule TAKE 1 CAPSULE BY MOUTH EVERY DAY 90 capsule 1  . rosuvastatin (CRESTOR) 20 MG tablet TAKE 1 TABLET BY MOUTH EVERY DAY 90 tablet 1  . valACYclovir (VALTREX) 1000 MG tablet Take 1 tablet (1,000 mg total) by mouth 3 (three) times daily. 21 tablet 0   No current facility-administered medications on file prior to visit.   Allergies  Allergen Reactions  . Metronidazole Anaphylaxis  . Amoxicillin-Pot Clavulanate Other (See Comments)    UNKNOWN  . Ciprofloxacin Other (See Comments)    UNKNOWN  . Clindamycin/Lincomycin Other (See Comments)    Nausea, stomach pain, sweats  . Mesalamine     REACTION: Intolerance   Social History   Socioeconomic History  . Marital status: Married    Spouse name: Not on file  . Number of children: 1  . Years of education: Some colg  . Highest education level: Not on file  Occupational History  . Occupation: Retired   Tobacco Use  . Smoking status: Never Smoker  . Smokeless tobacco: Never Used  Vaping Use  . Vaping Use: Never used  Substance and Sexual Activity  . Alcohol use: Yes    Alcohol/week: 14.0 standard drinks    Types: 14 Glasses of wine per week    Comment: per pt -drinks 10 oz red wine every night!  . Drug use: No  . Sexual activity: Never  Other Topics Concern  . Not on file  Social History Narrative   1 -2 cups of coffee a day, some tea consumption    Social Determinants of Health   Financial Resource Strain:   . Difficulty of Paying Living Expenses: Not on file  Food Insecurity:   . Worried About Charity fundraiser in the Last Year: Not on file  . Ran Out of Food in the Last Year: Not on file  Transportation Needs:     . Lack of Transportation (Medical): Not on file  . Lack of Transportation (Non-Medical): Not on file  Physical Activity:   . Days of Exercise per Week: Not on file  . Minutes of Exercise per Session: Not on file  Stress:   . Feeling of Stress : Not on file  Social Connections:   . Frequency of Communication with Friends and Family: Not on file  . Frequency of Social Gatherings with Friends and Family: Not on file  . Attends Religious Services: Not on file  . Active Member of Clubs or  Organizations: Not on file  . Attends Archivist Meetings: Not on file  . Marital Status: Not on file  Intimate Partner Violence:   . Fear of Current or Ex-Partner: Not on file  . Emotionally Abused: Not on file  . Physically Abused: Not on file  . Sexually Abused: Not on file      Review of Systems  All other systems reviewed and are negative.      Objective:   Physical Exam Vitals reviewed.  Constitutional:      General: He is not in acute distress.    Appearance: He is well-developed. He is not diaphoretic.  Neck:     Thyroid: No thyromegaly.     Vascular: No JVD.  Cardiovascular:     Rate and Rhythm: Normal rate and regular rhythm.     Heart sounds: Normal heart sounds. No murmur heard.  No friction rub. No gallop.   Pulmonary:     Effort: Pulmonary effort is normal. No respiratory distress.     Breath sounds: Normal breath sounds. No wheezing or rales.  Abdominal:     General: Bowel sounds are normal. There is no distension.     Palpations: Abdomen is soft. There is no mass.     Tenderness: There is no guarding or rebound.  Genitourinary:    Prostate: Normal. Not enlarged and not tender.  Lymphadenopathy:     Cervical: No cervical adenopathy.  Neurological:     Mental Status: He is alert and oriented to person, place, and time.     Cranial Nerves: No cranial nerve deficit.     Sensory: Sensory deficit present.     Motor: Motor function is intact. No weakness,  tremor, atrophy, abnormal muscle tone or pronator drift.     Coordination: Romberg sign negative. Coordination normal. Heel to Shin Test abnormal. Finger-Nose-Finger Test normal.     Gait: Gait abnormal.     Deep Tendon Reflexes: Reflexes are normal and symmetric.           Assessment & Plan:  Cerebellar ataxia ([place]) - Plan: CBC with Differential/Platelet, COMPLETE METABOLIC PANEL WITH GFR, Lipid panel  Cerebrovascular accident (CVA) due to thrombosis of cerebellar artery, unspecified blood vessel laterality (HCC)  Begin aspirin 81 mg daily immediately.  Resume Crestor 20 mg daily immediately.  Schedule an MRI of the brain along with an MRA of the brain and an MRA of the neck as soon as possible.

## 2019-10-25 LAB — CBC WITH DIFFERENTIAL/PLATELET
Absolute Monocytes: 540 cells/uL (ref 200–950)
Basophils Absolute: 121 cells/uL (ref 0–200)
Basophils Relative: 1.7 %
Eosinophils Absolute: 511 cells/uL — ABNORMAL HIGH (ref 15–500)
Eosinophils Relative: 7.2 %
HCT: 48 % (ref 38.5–50.0)
Hemoglobin: 16.4 g/dL (ref 13.2–17.1)
Lymphs Abs: 2095 cells/uL (ref 850–3900)
MCH: 34.7 pg — ABNORMAL HIGH (ref 27.0–33.0)
MCHC: 34.2 g/dL (ref 32.0–36.0)
MCV: 101.5 fL — ABNORMAL HIGH (ref 80.0–100.0)
MPV: 10.2 fL (ref 7.5–12.5)
Monocytes Relative: 7.6 %
Neutro Abs: 3834 cells/uL (ref 1500–7800)
Neutrophils Relative %: 54 %
Platelets: 224 10*3/uL (ref 140–400)
RBC: 4.73 10*6/uL (ref 4.20–5.80)
RDW: 12.5 % (ref 11.0–15.0)
Total Lymphocyte: 29.5 %
WBC: 7.1 10*3/uL (ref 3.8–10.8)

## 2019-10-25 LAB — COMPLETE METABOLIC PANEL WITH GFR
AG Ratio: 1.6 (calc) (ref 1.0–2.5)
ALT: 9 U/L (ref 9–46)
AST: 12 U/L (ref 10–35)
Albumin: 4 g/dL (ref 3.6–5.1)
Alkaline phosphatase (APISO): 68 U/L (ref 35–144)
BUN/Creatinine Ratio: 12 (calc) (ref 6–22)
BUN: 24 mg/dL (ref 7–25)
CO2: 28 mmol/L (ref 20–32)
Calcium: 9.5 mg/dL (ref 8.6–10.3)
Chloride: 108 mmol/L (ref 98–110)
Creat: 1.95 mg/dL — ABNORMAL HIGH (ref 0.70–1.18)
GFR, Est African American: 37 mL/min/{1.73_m2} — ABNORMAL LOW (ref >=60)
GFR, Est Non African American: 32 mL/min/{1.73_m2} — ABNORMAL LOW (ref >=60)
Globulin: 2.5 g/dL (calc) (ref 1.9–3.7)
Glucose, Bld: 76 mg/dL (ref 65–99)
Potassium: 4.8 mmol/L (ref 3.5–5.3)
Sodium: 143 mmol/L (ref 135–146)
Total Bilirubin: 0.7 mg/dL (ref 0.2–1.2)
Total Protein: 6.5 g/dL (ref 6.1–8.1)

## 2019-10-25 LAB — LIPID PANEL
Cholesterol: 192 mg/dL (ref <200)
HDL: 42 mg/dL (ref >=40)
LDL Cholesterol (Calc): 130 mg/dL (calc) — ABNORMAL HIGH
Non-HDL Cholesterol (Calc): 150 mg/dL (calc) — ABNORMAL HIGH (ref <130)
Total CHOL/HDL Ratio: 4.6 (calc) (ref <5.0)
Triglycerides: 102 mg/dL (ref <150)

## 2019-10-27 ENCOUNTER — Other Ambulatory Visit: Payer: PPO

## 2019-10-27 ENCOUNTER — Other Ambulatory Visit: Payer: Self-pay

## 2019-10-27 ENCOUNTER — Other Ambulatory Visit: Payer: Self-pay | Admitting: Family Medicine

## 2019-10-27 ENCOUNTER — Other Ambulatory Visit: Payer: Self-pay | Admitting: *Deleted

## 2019-10-27 DIAGNOSIS — R300 Dysuria: Secondary | ICD-10-CM

## 2019-10-27 DIAGNOSIS — N179 Acute kidney failure, unspecified: Secondary | ICD-10-CM

## 2019-10-27 LAB — URINALYSIS, ROUTINE W REFLEX MICROSCOPIC
Bilirubin Urine: NEGATIVE
Glucose, UA: NEGATIVE
Hgb urine dipstick: NEGATIVE
Ketones, ur: NEGATIVE
Leukocytes,Ua: NEGATIVE
Nitrite: NEGATIVE
RBC / HPF: NONE SEEN /HPF (ref 0–2)
Specific Gravity, Urine: 1.024 (ref 1.001–1.03)
pH: 5.5 (ref 5.0–8.0)

## 2019-10-27 LAB — MICROSCOPIC MESSAGE

## 2019-10-27 MED ORDER — SULFAMETHOXAZOLE-TRIMETHOPRIM 800-160 MG PO TABS: 1.0000 | ORAL_TABLET | Freq: Two times a day (BID) | ORAL | 0 refills | Status: DC

## 2019-10-28 ENCOUNTER — Ambulatory Visit (HOSPITAL_COMMUNITY)
Admission: RE | Admit: 2019-10-28 | Discharge: 2019-10-28 | Disposition: A | Discharge status: Home / Self Care | Payer: PPO | Source: Ambulatory Visit | Attending: Family Medicine | Admitting: Family Medicine

## 2019-10-28 DIAGNOSIS — I63349 Cerebral infarction due to thrombosis of unspecified cerebellar artery: Secondary | ICD-10-CM | POA: Diagnosis not present

## 2019-10-28 DIAGNOSIS — I63233 Cerebral infarction due to unspecified occlusion or stenosis of bilateral carotid arteries: Secondary | ICD-10-CM | POA: Diagnosis not present

## 2019-10-28 DIAGNOSIS — I6782 Cerebral ischemia: Secondary | ICD-10-CM | POA: Insufficient documentation

## 2019-10-28 DIAGNOSIS — G119 Hereditary ataxia, unspecified: Secondary | ICD-10-CM | POA: Insufficient documentation

## 2019-10-28 DIAGNOSIS — I6381 Other cerebral infarction due to occlusion or stenosis of small artery: Secondary | ICD-10-CM | POA: Insufficient documentation

## 2019-10-28 DIAGNOSIS — J3489 Other specified disorders of nose and nasal sinuses: Secondary | ICD-10-CM | POA: Insufficient documentation

## 2019-10-28 LAB — URINE CULTURE
MICRO NUMBER:: 10999071
SPECIMEN QUALITY:: ADEQUATE

## 2019-10-28 LAB — MICROALBUMIN / CREATININE URINE RATIO
Creatinine, Urine: 262 mg/dL (ref 20–320)
Microalb Creat Ratio: 16 mcg/mg creat (ref <30)
Microalb, Ur: 4.3 mg/dL

## 2019-10-28 MED ORDER — GADOBUTROL 1 MMOL/ML IV SOLN
10.0000 mL | Freq: Once | INTRAVENOUS | Status: AC | PRN
  Administered 2019-10-28: 10 mL via INTRAVENOUS

## 2019-10-29 ENCOUNTER — Telehealth: Payer: Self-pay | Admitting: Family Medicine

## 2019-10-29 NOTE — Telephone Encounter (Signed)
I spoke with Pt wife,  She states he has not had any changes since last week, just the same balance issues he was seen for Advised of results showing multiple infarcts some old , and the newest one within the past 1-2 weeks based on imaging He is now back on ASA, bystolic and Crestor  Will  need neurology f/u based on imaging results will defer to PCP   Discussed with pt wife, as he was not at home yet and did not answer phone, ifhe comes home and has any new signs of weakness, change in speech, vision etc, take him to [facility] Pt wife voiced understanding

## 2019-10-29 NOTE — Telephone Encounter (Signed)
#   [phone] Opal Sidles from [facility] has   call to give a STAT  Report.

## 2019-10-29 NOTE — Telephone Encounter (Signed)
Call placed to [facility] and report given (complete report in Epic).   Radiologist wanted to advise of the following: 1. Punctate focus of apparent restricted diffusion within the left parietal lobe which may reflect an acute/early subacute infarct or susceptibility artifact arising from the adjacent calvarium.  2. Additional subcentimeter focus of apparent diffusion weighted hyperintensity within the left parietooccipital lobes which is favored to reflect susceptibility artifact arising from the calvarium. A tiny acute/early subacute infarct at this site cannot be excluded.

## 2019-10-31 ENCOUNTER — Ambulatory Visit: Payer: Self-pay | Admitting: Family Medicine

## 2019-11-03 ENCOUNTER — Ambulatory Visit (INDEPENDENT_AMBULATORY_CARE_PROVIDER_SITE_OTHER): Payer: PPO | Admitting: Family Medicine

## 2019-11-03 ENCOUNTER — Encounter: Payer: Self-pay | Admitting: Family Medicine

## 2019-11-03 ENCOUNTER — Other Ambulatory Visit: Payer: Self-pay | Admitting: Family Medicine

## 2019-11-03 ENCOUNTER — Other Ambulatory Visit: Payer: Self-pay

## 2019-11-03 VITALS — BP 124/80 | HR 68 | Temp 97.3°F | Ht 74.0 in | Wt 220.0 lb

## 2019-11-03 DIAGNOSIS — G119 Hereditary ataxia, unspecified: Secondary | ICD-10-CM

## 2019-11-03 DIAGNOSIS — I6930 Unspecified sequelae of cerebral infarction: Secondary | ICD-10-CM

## 2019-11-03 DIAGNOSIS — I63349 Cerebral infarction due to thrombosis of unspecified cerebellar artery: Secondary | ICD-10-CM

## 2019-11-03 DIAGNOSIS — I6381 Other cerebral infarction due to occlusion or stenosis of small artery: Secondary | ICD-10-CM

## 2019-11-03 MED ORDER — ASPIRIN-DIPYRIDAMOLE ER 25-200 MG PO CP12: 1.0000 | ORAL_CAPSULE | Freq: Two times a day (BID) | ORAL | 5 refills | Status: DC

## 2019-11-03 NOTE — Progress Notes (Signed)
Subjective:    Patient ID: Matthew Luna, male    DOB: 1940-06-26, 79 y.o.   MRN: [id number]  10/24/19 Patient had an MRA of the brain along with an MRI of the brain performed in 05/2014.  The results are dictated below: IMPRESSION: MRI HEAD: No acute intracranial process.  Involutional changes. Mild to moderate white matter changes suggest chronic small vessel ischemic disease.  MRA HEAD: High-grade stenosis versus occlusion of proximal RIGHT P1 segment, mid to high-grade stenosis LEFT proximal P2 segment.  Atherosclerosis/stenosis of bilateral superior cerebellar arteries, possible occlusion on the LEFT.  Mild stenosis RIGHT supraclinoid internal carotid artery.  10/24/19  Patient is not sure when but at some point he stopped taking his aspirin and his Crestor.  He is here today because in the last week he has become progressively off balance.  He states that when he stands up or when he tries to walk he feels unsteady on his feet.  He also complains of some blurry vision primarily in his left eye.  He had an eye exam 6 months ago that was normal.  He denies any unilateral weakness, slurred speech, facial droop.  He denies any memory loss.  There is no weakness on his exam today and there is no hyperreflexia or spasticity.  He is able to perform Romberg testing.  He is able to perform finger-to-nose testing.  However heel-to-toe walk is grossly abnormal.  The patient cannot take 1 step heel-to-toe without falling to the side in a dramatic fashion.  He is unable to walk in a straight line.  He demonstrates ataxia unless he walks with a wide-based gait.  He denies any vertigo.  Dix-Hallpike maneuver is negative.  At that time, my plan was: Begin aspirin 81 mg daily immediately.  Resume Crestor 20 mg daily immediately.  Schedule an MRI of the brain along with an MRA of the brain and an MRA of the neck as soon as possible.  11/03/19  IMPRESSION: MRI brain:  1. Punctate focus of  apparent restricted diffusion within the left parietal lobe which may reflect an acute/early subacute infarct or susceptibility artifact arising from the adjacent calvarium. 2. Additional subcentimeter focus of apparent diffusion weighted hyperintensity within the left parietooccipital lobes which is favored to reflect susceptibility artifact arising from the calvarium. A tiny acute/early subacute infarct at this site cannot be excluded. 3. Moderate generalized cerebral atrophy and cerebral white matter chronic small vessel ischemic disease, progressed as compared to the MRI of 05/11/2014. 4. Small chronic infarcts within the right pons and right cerebellar hemisphere are new as compared to the MRI. 5. Unchanged chronic lacunar infarcts within the bilateral cerebral white matter, left thalamus and left cerebellar hemisphere. 6. Ethmoid sinus mucosal thickening. 7. Small bilateral mastoid effusions.  MRA head:  1. Intracranial atherosclerotic disease with multifocal stenoses, most notably as follows. 2. Atherosclerotic irregularity of the M2 and more distal MCA branches bilaterally, progressed as compared to the prior MRA of 05/11/2014. Most notably, high-grade stenoses are now present within multiple proximal left M2 MCA branches. 3. High-grade stenosis versus occlusion of the proximal P1 right PCA, unchanged. 4. Unchanged high-grade stenosis within the proximal P2 left PCA. 5. Severe atherosclerotic irregularity with high-grade stenoses within the proximal superior cerebellar arteries bilaterally, progressed on the right.  Patient is here today with his wife to discuss further.  Therefore there are at least 4 new strokes compared to his MRI from 2017.  There is one subacute infarct that appears  to have happened recently.  There are 2 infarcts near the left parietal lobe and to near the right cerebellum and right pons.  There are also chronic infarcts seen in the left cerebellar  hemisphere as well as left lacunar infarct.  Patient has numerous high-grade stenoses within the branches of the left middle cerebral artery as well as within branches of the right posterior cerebral artery and the left posterior cerebral artery.  Past Medical History:  Diagnosis Date  . AAA (abdominal aortic aneurysm) ([place])   . Actinic keratosis   . Allergy    mild   . Anxiety   . CKD (chronic kidney disease) stage 3, GFR 30-59 ml/min (HCC)   . Diverticulosis of colon (without mention of hemorrhage)   . Duodenitis without mention of hemorrhage   . Esophageal reflux   . Hypertrophy of prostate without urinary obstruction and other lower urinary tract symptoms (LUTS)   . Left sided ulcerative (chronic) colitis ([place])   . Mononeuritis of unspecified site   . Neuropathy   . Nonspecific abnormal results of thyroid function study   . Prostate cancer ([place])    cryotherapy 2011 [facility])  . Schatzki's ring   . Sleep apnea    does not use the CPAP  . Stricture and stenosis of esophagus   . TIA (transient ischemic attack) 2017   pt states was told mini stroke  . Unspecified essential hypertension    Past Surgical History:  Procedure Laterality Date  . ABDOMINAL AORTIC ANEURYSM REPAIR     2018  . BALLOON DILATION  03/22/2011   Procedure: BALLOON DILATION;  Surgeon: Inda Castle, MD;  Location: [facility];  Service: Endoscopy;  Laterality: N/A;  kelly/ebp  . CHOLECYSTECTOMY    . COLONOSCOPY    . ENDOVASCULAR REPAIR/STENT GRAFT N/A 03/29/2016   Procedure: Endovascular Repair/Stent Graft;  Surgeon: Algernon Huxley, MD;  Location: [facility];  Service: Cardiovascular;  Laterality: N/A;  . ESOPHAGOGASTRODUODENOSCOPY  03/22/2011   Procedure: ESOPHAGOGASTRODUODENOSCOPY (EGD);  Surgeon: Inda Castle, MD;  Location: [facility];  Service: Endoscopy;  Laterality: N/A;  . PROSTATE SURGERY    . UPPER GASTROINTESTINAL ENDOSCOPY     Current Outpatient Medications on File Prior to Visit   Medication Sig Dispense Refill  . aspirin EC 81 MG tablet Take 1 tablet (81 mg total) by mouth daily. 30 tablet 11  . BYSTOLIC 10 MG tablet TAKE 1/2 TABLET (5 MG TOTAL) BY MOUTH AT BEDTIME. 45 tablet 3  . cefdinir (OMNICEF) 300 MG capsule Take 1 capsule (300 mg total) by mouth 2 (two) times daily. 20 capsule 0  . citalopram (CELEXA) 40 MG tablet TAKE 1 TABLET BY MOUTH EVERY DAY 90 tablet 3  . clobetasol cream (TEMOVATE) 5.73 % Apply 1 application topically 2 (two) times daily. 45 g 3  . escitalopram (LEXAPRO) 10 MG tablet TAKE 1 TABLET BY MOUTH EVERY DAY 90 tablet 3  . gabapentin (NEURONTIN) 100 MG capsule TAKE 1 CAPSULE BY MOUTH AT BEDTIME. 90 capsule 1  . imipramine (TOFRANIL) 25 MG tablet Take 25 mg by mouth at bedtime.    Marland Kitchen MYRBETRIQ 50 MG TB24 tablet TAKE 1 TABLET BY MOUTH EVERY DAY 30 tablet 5  . omeprazole (PRILOSEC) 40 MG capsule TAKE 1 CAPSULE BY MOUTH EVERY DAY (Patient not taking: Reported on 10/24/2019) 90 capsule 1  . rosuvastatin (CRESTOR) 20 MG tablet TAKE 1 TABLET BY MOUTH EVERY DAY 90 tablet 1  . sulfamethoxazole-trimethoprim (BACTRIM DS) 800-160 MG tablet  Take 1 tablet by mouth 2 (two) times daily. 28 tablet 0  . valACYclovir (VALTREX) 1000 MG tablet Take 1 tablet (1,000 mg total) by mouth 3 (three) times daily. 21 tablet 0   No current facility-administered medications on file prior to visit.   Allergies  Allergen Reactions  . Metronidazole Anaphylaxis  . Amoxicillin-Pot Clavulanate Other (See Comments)    UNKNOWN  . Ciprofloxacin Other (See Comments)    UNKNOWN  . Clindamycin/Lincomycin Other (See Comments)    Nausea, stomach pain, sweats  . Mesalamine     REACTION: Intolerance   Social History   Socioeconomic History  . Marital status: Married    Spouse name: Not on file  . Number of children: 1  . Years of education: Some colg  . Highest education level: Not on file  Occupational History  . Occupation: Retired   Tobacco Use  . Smoking status: Never  Smoker  . Smokeless tobacco: Never Used  Vaping Use  . Vaping Use: Never used  Substance and Sexual Activity  . Alcohol use: Yes    Alcohol/week: 14.0 standard drinks    Types: 14 Glasses of wine per week    Comment: per pt -drinks 10 oz red wine every night!  . Drug use: No  . Sexual activity: Never  Other Topics Concern  . Not on file  Social History Narrative   1 -2 cups of coffee a day, some tea consumption    Social Determinants of Health   Financial Resource Strain:   . Difficulty of Paying Living Expenses: Not on file  Food Insecurity:   . Worried About Charity fundraiser in the Last Year: Not on file  . Ran Out of Food in the Last Year: Not on file  Transportation Needs:   . Lack of Transportation (Medical): Not on file  . Lack of Transportation (Non-Medical): Not on file  Physical Activity:   . Days of Exercise per Week: Not on file  . Minutes of Exercise per Session: Not on file  Stress:   . Feeling of Stress : Not on file  Social Connections:   . Frequency of Communication with Friends and Family: Not on file  . Frequency of Social Gatherings with Friends and Family: Not on file  . Attends Religious Services: Not on file  . Active Member of Clubs or Organizations: Not on file  . Attends Archivist Meetings: Not on file  . Marital Status: Not on file  Intimate Partner Violence:   . Fear of Current or Ex-Partner: Not on file  . Emotionally Abused: Not on file  . Physically Abused: Not on file  . Sexually Abused: Not on file      Review of Systems  All other systems reviewed and are negative.      Objective:   Physical Exam Vitals reviewed.  Constitutional:      General: He is not in acute distress.    Appearance: He is well-developed. He is not diaphoretic.  Neck:     Thyroid: No thyromegaly.     Vascular: No JVD.  Cardiovascular:     Rate and Rhythm: Normal rate and regular rhythm.     Heart sounds: Normal heart sounds. No murmur  heard.  No friction rub. No gallop.   Pulmonary:     Effort: Pulmonary effort is normal. No respiratory distress.     Breath sounds: Normal breath sounds. No wheezing or rales.  Abdominal:     General: Bowel  sounds are normal. There is no distension.     Palpations: Abdomen is soft. There is no mass.     Tenderness: There is no guarding or rebound.  Genitourinary:    Prostate: Normal. Not enlarged and not tender.  Lymphadenopathy:     Cervical: No cervical adenopathy.  Neurological:     Mental Status: He is alert and oriented to person, place, and time.     Cranial Nerves: No cranial nerve deficit.     Sensory: Sensory deficit present.     Motor: Motor function is intact. No weakness, tremor, atrophy, abnormal muscle tone or pronator drift.     Coordination: Romberg sign negative. Coordination normal. Heel to Shin Test abnormal. Finger-Nose-Finger Test normal.     Deep Tendon Reflexes: Reflexes are normal and symmetric.           Assessment & Plan:  Cerebrovascular accident (CVA) due to stenosis of small artery ([place]) - Plan: Ambulatory referral to Neurology  Cerebellar ataxia ([place]) - Plan: Ambulatory referral to Neurology  Cerebrovascular accident (CVA) due to thrombosis of cerebellar artery, unspecified blood vessel laterality ([place]) - Plan: Ambulatory referral to Neurology  I want to maximize medical therapy.  Discontinue aspirin.  Replace with Aggrenox 1 tablet twice daily.  Continue high-dose Crestor.  Blood pressure today is excellent at 124/80.  Consult neurology.  My expectation is that the patient is not a candidate for any intravascular intervention however given his numerous strokes, I would like a second opinion particular given the bilateral posterior cerebral artery stenoses.  Spent more than 30 minutes today with the patient and his wife reviewing the MRI and determining our plan of care.

## 2019-11-04 ENCOUNTER — Other Ambulatory Visit: Payer: PPO

## 2019-11-04 ENCOUNTER — Telehealth: Payer: Self-pay | Admitting: Family Medicine

## 2019-11-04 NOTE — Telephone Encounter (Signed)
CB# [phone] Was seen yesterday wasn't sure what medications Dr.Pickard told him to continue taken.

## 2019-11-05 NOTE — Progress Notes (Deleted)
NEUROLOGY CONSULTATION NOTE  ASHOK SAWAYA MRN: [id number] DOB: 06/16/1940  Referring provider: Cammie Mcgee. Dennard Schaumann, MD Primary care provider: Cammie Mcgee. Dennard Schaumann, MD  Reason for consult:  Stroke, balance disorder  HISTORY OF PRESENT ILLNESS: Matthew Luna is a 79 year old ***-handed male with CKD, HTN and history of strokes and prostate cancer who presents for stroke and balance disorder.  History supplemented by referring provider's notes.  MRI and MRA of brain personally reviewed.  Last month, he developed sudden and progressive balance dysfunction.  When he stands or tries to walk, he feels unsteady on his feet, as well as blurred vision, worse in the left eye.  No associated headache, speech disorder, weakness, numbness or dizziness.  Prior to onset of symptoms, he had stopped taking ASA and Crestor.  Lipid panel on 10/24/2019 demonstrated total chol 192, HDL 42, TG 102 and LDL 130.  He was advised to restart ASA and Crestor.  MRI of brain with and without contrast on 10/28/2019 showed punctate diffusion-weighted foci within the left parietal lobe and left parietooccipital lobes were seen, representing either acute/early subacute infarcts or susceptibility artifact.  Also seen were moderate generalized cerebral atrophy, chronic small vessel ischemic changes and small chronic infarcts within the right pons, bilateral cerebellar hemispheres, and bilateral cerebral white matter and left thalamus.  MRA of head showed intracranial atherosclerotic disease with multifocal stenoses with high-grade stenosis of the proximal left M2, bilateral proximal superior cerebellar arteries, and high-grade vs occlusion of the proximal right P1.  PAST MEDICAL HISTORY: Past Medical History:  Diagnosis Date  . AAA (abdominal aortic aneurysm) ([place])   . Actinic keratosis   . Allergy    mild   . Anxiety   . CKD (chronic kidney disease) stage 3, GFR 30-59 ml/min ([facility])   . Diverticulosis of colon (without mention of  hemorrhage)   . Duodenitis without mention of hemorrhage   . Esophageal reflux   . Hypertrophy of prostate without urinary obstruction and other lower urinary tract symptoms (LUTS)   . Left sided ulcerative (chronic) colitis ([place])   . Mononeuritis of unspecified site   . Neuropathy   . Nonspecific abnormal results of thyroid function study   . Prostate cancer ([place])    cryotherapy 2011 [facility])  . Schatzki's ring   . Sleep apnea    does not use the CPAP  . Stricture and stenosis of esophagus   . Stroke ([place])    left parietal, left parietal/occipital, right cerebellar, right pons  . TIA (transient ischemic attack) 2017   pt states was told mini stroke  . Unspecified essential hypertension     PAST SURGICAL HISTORY: Past Surgical History:  Procedure Laterality Date  . ABDOMINAL AORTIC ANEURYSM REPAIR     2018  . BALLOON DILATION  03/22/2011   Procedure: BALLOON DILATION;  Surgeon: Inda Castle, MD;  Location: [facility];  Service: Endoscopy;  Laterality: N/A;  kelly/ebp  . CHOLECYSTECTOMY    . COLONOSCOPY    . ENDOVASCULAR REPAIR/STENT GRAFT N/A 03/29/2016   Procedure: Endovascular Repair/Stent Graft;  Surgeon: Algernon Huxley, MD;  Location: [place] [facility];  Service: Cardiovascular;  Laterality: N/A;  . ESOPHAGOGASTRODUODENOSCOPY  03/22/2011   Procedure: ESOPHAGOGASTRODUODENOSCOPY (EGD);  Surgeon: Inda Castle, MD;  Location: [facility];  Service: Endoscopy;  Laterality: N/A;  . PROSTATE SURGERY    . UPPER GASTROINTESTINAL ENDOSCOPY      MEDICATIONS: Current Outpatient Medications on File Prior to Visit  Medication Sig Dispense  Refill  . BYSTOLIC 10 MG tablet TAKE 1/2 TABLET (5 MG TOTAL) BY MOUTH AT BEDTIME. 45 tablet 3  . clobetasol cream (TEMOVATE) 4.40 % Apply 1 application topically 2 (two) times daily. 45 g 3  . dipyridamole-aspirin (AGGRENOX) 200-25 MG 12hr capsule Take 1 capsule by mouth 2 (two) times daily. 60 capsule 5  . escitalopram (LEXAPRO) 10 MG tablet  TAKE 1 TABLET BY MOUTH EVERY DAY 90 tablet 3  . gabapentin (NEURONTIN) 100 MG capsule TAKE 1 CAPSULE BY MOUTH AT BEDTIME. 90 capsule 1  . omeprazole (PRILOSEC) 40 MG capsule TAKE 1 CAPSULE BY MOUTH EVERY DAY 90 capsule 1  . rosuvastatin (CRESTOR) 20 MG tablet TAKE 1 TABLET BY MOUTH EVERY DAY 90 tablet 1   No current facility-administered medications on file prior to visit.    ALLERGIES: Allergies  Allergen Reactions  . Metronidazole Anaphylaxis  . Amoxicillin-Pot Clavulanate Other (See Comments)    UNKNOWN  . Ciprofloxacin Other (See Comments)    UNKNOWN  . Clindamycin/Lincomycin Other (See Comments)    Nausea, stomach pain, sweats  . Mesalamine     REACTION: Intolerance    FAMILY HISTORY: Family History  Problem Relation Age of Onset  . Cancer Mother        unknown type  . Snoring Father   . Anesthesia problems Neg Hx   . Hypotension Neg Hx   . Malignant hyperthermia Neg Hx   . Pseudochol deficiency Neg Hx   . Colon cancer Neg Hx   . Colon polyps Neg Hx   . Esophageal cancer Neg Hx   . Stomach cancer Neg Hx   . Rectal cancer Neg Hx     SOCIAL HISTORY: Social History   Socioeconomic History  . Marital status: Married    Spouse name: Not on file  . Number of children: 1  . Years of education: Some colg  . Highest education level: Not on file  Occupational History  . Occupation: Retired   Tobacco Use  . Smoking status: Never Smoker  . Smokeless tobacco: Never Used  Vaping Use  . Vaping Use: Never used  Substance and Sexual Activity  . Alcohol use: Yes    Alcohol/week: 14.0 standard drinks    Types: 14 Glasses of wine per week    Comment: per pt -drinks 10 oz red wine every night!  . Drug use: No  . Sexual activity: Never  Other Topics Concern  . Not on file  Social History Narrative   1 -2 cups of coffee a day, some tea consumption    Social Determinants of Health   Financial Resource Strain:   . Difficulty of Paying Living Expenses: Not on file    Food Insecurity:   . Worried About Charity fundraiser in the Last Year: Not on file  . Ran Out of Food in the Last Year: Not on file  Transportation Needs:   . Lack of Transportation (Medical): Not on file  . Lack of Transportation (Non-Medical): Not on file  Physical Activity:   . Days of Exercise per Week: Not on file  . Minutes of Exercise per Session: Not on file  Stress:   . Feeling of Stress : Not on file  Social Connections:   . Frequency of Communication with Friends and Family: Not on file  . Frequency of Social Gatherings with Friends and Family: Not on file  . Attends Religious Services: Not on file  . Active Member of Clubs or Organizations: Not on  file  . Attends Archivist Meetings: Not on file  . Marital Status: Not on file  Intimate Partner Violence:   . Fear of Current or Ex-Partner: Not on file  . Emotionally Abused: Not on file  . Physically Abused: Not on file  . Sexually Abused: Not on file    PHYSICAL EXAM: *** General: No acute distress.  Patient appears well-groomed.  Head:  Normocephalic/atraumatic Eyes:  fundi examined but not visualized Neck: supple, no paraspinal tenderness, full range of motion Back: No paraspinal tenderness Heart: regular rate and rhythm Lungs: Clear to auscultation bilaterally. Vascular: No carotid bruits. Neurological Exam: Mental status: alert and oriented to person, place, and time, recent and remote memory intact, fund of knowledge intact, attention and concentration intact, speech fluent and not dysarthric, language intact. Cranial nerves: CN I: not tested CN II: pupils equal, round and reactive to light, visual fields intact CN III, IV, VI:  full range of motion, no nystagmus, no ptosis CN V: facial sensation intact CN VII: upper and lower face symmetric CN VIII: hearing intact CN IX, X: gag intact, uvula midline CN XI: sternocleidomastoid and trapezius muscles intact CN XII: tongue midline Bulk & Tone:  normal, no fasciculations. Motor:  5/5 throughout  Sensation:  [place] *** temperature *** and vibration sensation intact.  ***. Deep Tendon Reflexes:  2+ throughout, *** toes downgoing.  *** Finger to nose testing:  Without dysmetria.  *** Heel to shin:  Without dysmetria.  *** Gait:  Normal station and stride.  Able to turn and tandem walk. Romberg ***.  IMPRESSION: ***  PLAN: ***  Thank you for allowing me to take part in the care of this patient.  Metta Clines, DO  CC: ***

## 2019-11-07 ENCOUNTER — Ambulatory Visit: Payer: PPO | Admitting: Neurology

## 2019-11-07 NOTE — Telephone Encounter (Signed)
Patient in office and made aware.  

## 2019-11-17 NOTE — Progress Notes (Signed)
NEUROLOGY CONSULTATION NOTE  IRVINE GLORIOSO MRN: [id number] DOB: March 30, 1940  Referring provider: Jenna Luo, MD Primary care provider: Jenna Luo, MD  Reason for consult:  stroke  HISTORY OF PRESENT ILLNESS: Matthew Luna is a 79 year old left-handed male with HTN, HLD, AAA, sleep apnea, CKD and history of stroke who presents for stroke.  History supplemented by referring provider's notes.  Current medications:  Bystolic, Aggrenox, Crestor 20mg   In September, he started noticing problems with his balance, dizziness and headache and blurred vision.  When he stands up or tries to walk, he feels unsteady on his feet.  No lightheadedness, spinning sensation, slurred speech, unilateral weakness, numbness or facial droop.  MRI of brain without contrast on 10/28/2019 personally reviewed showed punctate foci representing susceptibility artifact vs acute/subacute infarcts within the left parietal lobe and left parietooccipital lobes as well as small chronic infarcts within the right pons and bilateral cerebellar hemispheres and within the bilateral cerebral white matter and left thalamus.  MRA of head personally reviewed showed intracranial atherosclerotic disease with multifocal stenoses involving the bilateral M2 and distal MCA branches with high-grade stenosis on the left, high-grade stenosis vs occlusion of proximal P1 right PCA, high-grade stenosis within proximal P2 left PCA and high-grade stenoses within bilateral proximal superior cerebellar arteries.  LDL from 10/24/2019 was 130.  He was switched from ASA 81mg  to Aggrenox about 2 weeks ago.  However, he had been noncompliant with the ASA (not taking it regularly).  No change in Crestor.  Still feels unsteady on feet.  Since starting Aggrenox, notes increased headaches.  PAST MEDICAL HISTORY: Past Medical History:  Diagnosis Date   AAA (abdominal aortic aneurysm) ([facility])    Actinic keratosis    Allergy    mild    Anxiety    CKD  (chronic kidney disease) stage 3, GFR 30-59 ml/min (HCC)    Diverticulosis of colon (without mention of hemorrhage)    Duodenitis without mention of hemorrhage    Esophageal reflux    Hypertrophy of prostate without urinary obstruction and other lower urinary tract symptoms (LUTS)    Left sided ulcerative (chronic) colitis ([facility])    Mononeuritis of unspecified site    Neuropathy    Nonspecific abnormal results of thyroid function study    Prostate cancer ([place])    cryotherapy 2011 ([facility])   Schatzki's ring    Sleep apnea    does not use the CPAP   Stricture and stenosis of esophagus    Stroke ([facility])    left parietal, left parietal/occipital, right cerebellar, right pons   TIA (transient ischemic attack) 2017   pt states was told mini stroke   Unspecified essential hypertension     PAST SURGICAL HISTORY: Past Surgical History:  Procedure Laterality Date   ABDOMINAL AORTIC ANEURYSM REPAIR     2018   BALLOON DILATION  03/22/2011   Procedure: BALLOON DILATION;  Surgeon: Inda Castle, MD;  Location: [facility];  Service: Endoscopy;  Laterality: N/A;  kelly/ebp   CHOLECYSTECTOMY     COLONOSCOPY     ENDOVASCULAR REPAIR/STENT GRAFT N/A 03/29/2016   Procedure: Endovascular Repair/Stent Graft;  Surgeon: Algernon Huxley, MD;  Location: [place] [facility];  Service: Cardiovascular;  Laterality: N/A;   ESOPHAGOGASTRODUODENOSCOPY  03/22/2011   Procedure: ESOPHAGOGASTRODUODENOSCOPY (EGD);  Surgeon: Inda Castle, MD;  Location: [facility];  Service: Endoscopy;  Laterality: N/A;   PROSTATE SURGERY     UPPER GASTROINTESTINAL ENDOSCOPY      MEDICATIONS:  Current Outpatient Medications on File Prior to Visit  Medication Sig Dispense Refill   BYSTOLIC 10 MG tablet TAKE 1/2 TABLET (5 MG TOTAL) BY MOUTH AT BEDTIME. 45 tablet 3   clobetasol cream (TEMOVATE) 0.99 % Apply 1 application topically 2 (two) times daily. 45 g 3   dipyridamole-aspirin (AGGRENOX) 200-25 MG 12hr  capsule Take 1 capsule by mouth 2 (two) times daily. 60 capsule 5   escitalopram (LEXAPRO) 10 MG tablet TAKE 1 TABLET BY MOUTH EVERY DAY 90 tablet 3   gabapentin (NEURONTIN) 100 MG capsule TAKE 1 CAPSULE BY MOUTH AT BEDTIME. 90 capsule 1   omeprazole (PRILOSEC) 40 MG capsule TAKE 1 CAPSULE BY MOUTH EVERY DAY 90 capsule 1   rosuvastatin (CRESTOR) 20 MG tablet TAKE 1 TABLET BY MOUTH EVERY DAY 90 tablet 1   No current facility-administered medications on file prior to visit.    ALLERGIES: Allergies  Allergen Reactions   Metronidazole Anaphylaxis   Amoxicillin-Pot Clavulanate Other (See Comments)    UNKNOWN   Ciprofloxacin Other (See Comments)    UNKNOWN   Clindamycin/Lincomycin Other (See Comments)    Nausea, stomach pain, sweats   Mesalamine     REACTION: Intolerance    FAMILY HISTORY: Family History  Problem Relation Age of Onset   Cancer Mother        unknown type   Snoring Father    Anesthesia problems Neg Hx    Hypotension Neg Hx    Malignant hyperthermia Neg Hx    Pseudochol deficiency Neg Hx    Colon cancer Neg Hx    Colon polyps Neg Hx    Esophageal cancer Neg Hx    Stomach cancer Neg Hx    Rectal cancer Neg Hx     SOCIAL HISTORY: Social History   Socioeconomic History   Marital status: Married    Spouse name: Not on file   Number of children: 1   Years of education: Some colg   Highest education level: Not on file  Occupational History   Occupation: Retired   Tobacco Use   Smoking status: Never Smoker   Smokeless tobacco: Never Used  Scientific laboratory technician Use: Never used  Substance and Sexual Activity   Alcohol use: Yes    Alcohol/week: 14.0 standard drinks    Types: 14 Glasses of wine per week    Comment: per pt -drinks 10 oz red wine every night!   Drug use: No   Sexual activity: Never  Other Topics Concern   Not on file  Social History Narrative   1 -2 cups of coffee a day, some tea consumption    Social  Determinants of Health   Financial Resource Strain:    Difficulty of Paying Living Expenses: Not on file  Food Insecurity:    Worried About [place] in the Last Year: Not on file   Ran Out of Food in the Last Year: Not on file  Transportation Needs:    Lack of Transportation (Medical): Not on file   Lack of Transportation (Non-Medical): Not on file  Physical Activity:    Days of Exercise per Week: Not on file   Minutes of Exercise per Session: Not on file  Stress:    Feeling of Stress : Not on file  Social Connections:    Frequency of Communication with Friends and Family: Not on file   Frequency of Social Gatherings with Friends and Family: Not on file   Attends Religious Services: Not on  file   Active Member of Clubs or Organizations: Not on file   Attends Archivist Meetings: Not on file   Marital Status: Not on file  Intimate Partner Violence:    Fear of Current or Ex-Partner: Not on file   Emotionally Abused: Not on file   Physically Abused: Not on file   Sexually Abused: Not on file   PHYSICAL EXAM: Blood pressure (!) 147/86, pulse 67, height 6\' 3"  (1.905 m), weight 217 lb 12.8 oz (98.8 kg), SpO2 97 %. General: No acute distress.  Patient appears well-groomed.   Head:  Normocephalic/atraumatic Eyes:  fundi examined but not visualized Neck: supple, no paraspinal tenderness, full range of motion Back: No paraspinal tenderness Heart: regular rate and rhythm Lungs: Clear to auscultation bilaterally. Vascular: No carotid bruits. Neurological Exam: Mental status: alert and oriented to person, place, and time, recent and remote memory intact, fund of knowledge intact, attention and concentration intact, speech fluent and not dysarthric, language intact. Cranial nerves: CN I: not tested CN II: pupils equal, round and reactive to light, visual fields intact CN III, IV, VI:  full range of motion, no nystagmus, no ptosis CN V: facial  sensation intact CN VII: upper and lower face symmetric CN VIII: hearing intact CN IX, X: gag intact, uvula midline CN XI: sternocleidomastoid and trapezius muscles intact CN XII: tongue midline Bulk & Tone: normal, no fasciculations. Motor:  5/5 throughout Sensation:  Temperature and vibration sensation reduced in feet Deep Tendon Reflexes:  2+ throughout, toes downgoing.  Finger to nose testing:  Without dysmetria.   Heel to shin:  Without dysmetria.   Gait:  Mildly unsteady gait.  Romberg negative.  IMPRESSION: 1.  Left parietooccipital infarcts in setting of left PCA stenosis 2.  Hypertension 3.  Hyperlipidemia  I would stop Aggrenox and start him on dual antiplatelet therapy (ASA and Plavix).  Given the high-grade PCA stenosis, he should remain on dual antiplatelet therapy for 3 months.  Since he is already on month into the stroke, will have him take it for 2 months.  Then ASA alone.  He never actually failed ASA as he was noncompliant.  PLAN: 1.  Stop Aggrenox.  Start ASA 81mg  and Plavix 75mg  daily for 3 months, followed by ASA 81mg  daily alone.  2.  Increase Crestor to 40mg  daily.  LDL goal less than 70.  Repeat lipid panel in 3 months. 3.  Blood pressure control.  Given high-grade PCA stenosis vs occlusion, goal blood pressure should be a little elevated to ensure adequate perfusion (SBP goal 130-150). 4.  Glycemic control.  Hgb A1c goal less than 7. 5.  Check MRA of neck 6.  Check echocardiogram with bubble study 7.  14 day ZIO patch 8.  Given his blurred vision, I recommend following up with his eye doctor to check vision, including visual fields.  I don't appreciate any visual field defect on my exam. 9.  Mediterranean diet. 10.  Follow up in 4 to 6 months.  Thank you for allowing me to take part in the care of this patient.  Metta Clines, DO  CC: Jenna Luo, MD

## 2019-11-18 ENCOUNTER — Other Ambulatory Visit: Payer: Self-pay

## 2019-11-18 ENCOUNTER — Ambulatory Visit (INDEPENDENT_AMBULATORY_CARE_PROVIDER_SITE_OTHER): Payer: PPO | Admitting: Neurology

## 2019-11-18 ENCOUNTER — Telehealth: Payer: Self-pay

## 2019-11-18 ENCOUNTER — Encounter: Payer: Self-pay | Admitting: Neurology

## 2019-11-18 VITALS — BP 147/86 | HR 67 | Ht 75.0 in | Wt 217.8 lb

## 2019-11-18 DIAGNOSIS — I10 Essential (primary) hypertension: Secondary | ICD-10-CM | POA: Diagnosis not present

## 2019-11-18 DIAGNOSIS — I63532 Cerebral infarction due to unspecified occlusion or stenosis of left posterior cerebral artery: Secondary | ICD-10-CM

## 2019-11-18 DIAGNOSIS — E785 Hyperlipidemia, unspecified: Secondary | ICD-10-CM

## 2019-11-18 MED ORDER — CLOPIDOGREL BISULFATE 75 MG PO TABS: 75.0000 mg | ORAL_TABLET | Freq: Every day | ORAL | 5 refills | Status: DC

## 2019-11-18 MED ORDER — ROSUVASTATIN CALCIUM 40 MG PO TABS: 40.0000 mg | ORAL_TABLET | Freq: Every day | ORAL | 5 refills | Status: DC

## 2019-11-18 MED ORDER — CLOPIDOGREL BISULFATE 75 MG PO TABS: 75.0000 mg | ORAL_TABLET | Freq: Every day | ORAL | 0 refills | Status: DC

## 2019-11-18 NOTE — Patient Instructions (Addendum)
1.  Stop Aggrenox.  Take aspirin 81mg  daily and clopidogrel 75mg  daily for 60 days.  Then stop clopidogrel and continue aspirin 81mg  daily alone. 2.  Increase rosuvastatin to 40mg  daily.  Repeat lipid panel in 3 months. 3.  Check MRA of neck 4.  Check echocardiogram  5.  Check 14 day cardiac event monitor 6.  Follow up with your eye doctor.  Evaluate visual fields as well. 7.  Start Mediterranean diet (See below) 8.  Follow up in 4 to 6 months.   Mediterranean Diet A Mediterranean diet refers to food and lifestyle choices that are based on the traditions of countries located on the [employer]. This way of eating has been shown to help prevent certain conditions and improve outcomes for people who have chronic diseases, like kidney disease and heart disease. What are tips for following this plan? Lifestyle  Cook and eat meals together with your family, when possible.  Drink enough fluid to keep your urine clear or pale yellow.  Be physically active every day. This includes: ? Aerobic exercise like running or swimming. ? Leisure activities like gardening, walking, or housework.  Get 7-8 hours of sleep each night.  If recommended by your health care provider, drink red wine in moderation. This means 1 glass a day for nonpregnant women and 2 glasses a day for men. A glass of wine equals 5 oz (150 mL). Reading food labels   Check the serving size of packaged foods. For foods such as rice and pasta, the serving size refers to the amount of cooked product, not dry.  Check the total fat in packaged foods. Avoid foods that have saturated fat or trans fats.  Check the ingredients list for added sugars, such as corn syrup. Shopping  At the grocery store, buy most of your food from the areas near the walls of the store. This includes: ? Fresh fruits and vegetables (produce). ? Grains, beans, nuts, and seeds. Some of these may be available in unpackaged forms or large amounts (in  bulk). ? Fresh seafood. ? Poultry and eggs. ? Low-fat dairy products.  Buy whole ingredients instead of prepackaged foods.  Buy fresh fruits and vegetables in-season from local farmers markets.  Buy frozen fruits and vegetables in resealable bags.  If you do not have access to quality fresh seafood, buy precooked frozen shrimp or canned fish, such as tuna, salmon, or sardines.  Buy small amounts of raw or cooked vegetables, salads, or olives from the deli or salad bar at your store.  Stock your pantry so you always have certain foods on hand, such as olive oil, canned tuna, canned tomatoes, rice, pasta, and beans. Cooking  Cook foods with extra-virgin olive oil instead of using butter or other vegetable oils.  Have meat as a side dish, and have vegetables or grains as your main dish. This means having meat in small portions or adding small amounts of meat to foods like pasta or stew.  Use beans or vegetables instead of meat in common dishes like chili or lasagna.  Experiment with different cooking methods. Try roasting or broiling vegetables instead of steaming or sauteing them.  Add frozen vegetables to soups, stews, pasta, or rice.  Add nuts or seeds for added healthy fat at each meal. You can add these to yogurt, salads, or vegetable dishes.  Marinate fish or vegetables using olive oil, lemon juice, garlic, and fresh herbs. Meal planning   Plan to eat 1 vegetarian meal one day  each week. Try to work up to 2 vegetarian meals, if possible.  Eat seafood 2 or more times a week.  Have healthy snacks readily available, such as: ? Vegetable sticks with hummus. ? [place] yogurt. ? Fruit and nut trail mix.  Eat balanced meals throughout the week. This includes: ? Fruit: 2-3 servings a day ? Vegetables: 4-5 servings a day ? Low-fat dairy: 2 servings a day ? Fish, poultry, or lean meat: 1 serving a day ? Beans and legumes: 2 or more servings a week ? Nuts and seeds: 1-2  servings a day ? Whole grains: 6-8 servings a day ? Extra-virgin olive oil: 3-4 servings a day  Limit red meat and sweets to only a few servings a month What are my food choices?  Mediterranean diet ? Recommended  Grains: Whole-grain pasta. Brown rice. Bulgar wheat. Polenta. Couscous. Whole-wheat bread. [place].  Vegetables: Artichokes. Beets. Broccoli. Cabbage. Carrots. Eggplant. Green beans. Chard. Kale. Spinach. Onions. Leeks. Peas. Squash. Tomatoes. Peppers. Radishes.  Fruits: Apples. Apricots. Avocado. Berries. Bananas. Cherries. Dates. Figs. Grapes. Lemons. Melon. Oranges. Peaches. Plums. Pomegranate.  Meats and other protein foods: Beans. Almonds. Sunflower seeds. Pine nuts. Peanuts. [place]. Salmon. Scallops. Shrimp. [place]. Tilapia. Clams. Oysters. Eggs.  Dairy: Low-fat milk. Cheese. Greek yogurt.  Beverages: Water. Red wine. Herbal tea.  Fats and oils: Extra virgin olive oil. Avocado oil. Grape seed oil.  Sweets and desserts: [place] yogurt with honey. Baked apples. Poached pears. Trail mix.  Seasoning and other foods: Basil. Cilantro. Coriander. Cumin. Mint. Parsley. Sage. Rosemary. Tarragon. Garlic. Oregano. Thyme. Pepper. Balsalmic vinegar. Tahini. Hummus. Tomato sauce. Olives. Mushrooms. ? Limit these  Grains: Prepackaged pasta or rice dishes. Prepackaged cereal with added sugar.  Vegetables: Deep fried potatoes (french fries).  Fruits: Fruit canned in syrup.  Meats and other protein foods: Beef. Pork. Lamb. Poultry with skin. Hot dogs. Berniece Salines.  Dairy: Ice cream. Sour cream. Whole milk.  Beverages: Juice. Sugar-sweetened soft drinks. Beer. Liquor and spirits.  Fats and oils: Butter. Canola oil. Vegetable oil. Beef fat (tallow). Lard.  Sweets and desserts: Cookies. Cakes. Pies. Candy.  Seasoning and other foods: Mayonnaise. [place]ade sauces and marinades. The items listed may not be a complete list. Talk with your dietitian about what dietary choices are right  for you. Summary  The Mediterranean diet includes both food and lifestyle choices.  Eat a variety of fresh fruits and vegetables, beans, nuts, seeds, and whole grains.  Limit the amount of red meat and sweets that you eat.  Talk with your health care provider about whether it is safe for you to drink red wine in moderation. This means 1 glass a day for nonpregnant women and 2 glasses a day for men. A glass of wine equals 5 oz (150 mL). This information is not intended to replace advice given to you by your health care provider. Make sure you discuss any questions you have with your health care provider. Document Revised: 09/16/2015 Document Reviewed: 09/09/2015 Elsevier Patient Education  [place].

## 2019-11-18 NOTE — Telephone Encounter (Signed)
Tried calling pt, No answer. Unable to LVM. Pt has an appt with MRI at [facility] 10/28 at 4 pm pt is to arrive at 3:30 pm, Echocardiogram w/ Bubble study at [place] at [facility] 10/22 at 1 pm. They will mail his Zio patch.

## 2019-11-19 ENCOUNTER — Telehealth: Payer: Self-pay | Admitting: *Deleted

## 2019-11-19 NOTE — Telephone Encounter (Signed)
Pt enrolled in [employer].

## 2019-11-21 ENCOUNTER — Other Ambulatory Visit: Payer: Self-pay

## 2019-11-21 ENCOUNTER — Ambulatory Visit (HOSPITAL_COMMUNITY)
Admission: RE | Admit: 2019-11-21 | Discharge: 2019-11-21 | Disposition: A | Discharge status: Home / Self Care | Payer: PPO | Source: Ambulatory Visit | Attending: Neurology | Admitting: Neurology

## 2019-11-21 DIAGNOSIS — I10 Essential (primary) hypertension: Secondary | ICD-10-CM | POA: Insufficient documentation

## 2019-11-21 DIAGNOSIS — I63532 Cerebral infarction due to unspecified occlusion or stenosis of left posterior cerebral artery: Secondary | ICD-10-CM | POA: Diagnosis not present

## 2019-11-21 DIAGNOSIS — E785 Hyperlipidemia, unspecified: Secondary | ICD-10-CM | POA: Diagnosis not present

## 2019-11-21 LAB — ECHOCARDIOGRAM COMPLETE BUBBLE STUDY
AR max vel: 3.22 cm2
AV Area VTI: 3.38 cm2
AV Area mean vel: 3.13 cm2
AV Mean grad: 2.4 mmHg
AV Peak grad: 4.3 mmHg
Ao pk vel: 1.04 m/s
Area-P 1/2: 3.87 cm2
P 1/2 time: 873 msec
S' Lateral: 2.53 cm

## 2019-11-21 NOTE — Progress Notes (Signed)
*  PRELIMINARY RESULTS* Echocardiogram 2D Echocardiogram has been performed with saline bubble study.  Samuel Germany 11/21/2019, 4:01 PM

## 2019-11-21 NOTE — Progress Notes (Signed)
I.V. removed @ 1355. Site was clean, dry and intact. Light bandage applied.   [place]

## 2019-11-27 ENCOUNTER — Ambulatory Visit (HOSPITAL_COMMUNITY)
Admission: RE | Admit: 2019-11-27 | Discharge: 2019-11-27 | Disposition: A | Discharge status: Home / Self Care | Payer: PPO | Source: Ambulatory Visit | Attending: Neurology | Admitting: Neurology

## 2019-11-27 ENCOUNTER — Other Ambulatory Visit: Payer: Self-pay

## 2019-11-27 DIAGNOSIS — I63532 Cerebral infarction due to unspecified occlusion or stenosis of left posterior cerebral artery: Secondary | ICD-10-CM

## 2019-11-27 DIAGNOSIS — E785 Hyperlipidemia, unspecified: Secondary | ICD-10-CM | POA: Diagnosis not present

## 2019-11-27 DIAGNOSIS — I10 Essential (primary) hypertension: Secondary | ICD-10-CM | POA: Diagnosis not present

## 2019-11-27 DIAGNOSIS — I6502 Occlusion and stenosis of left vertebral artery: Secondary | ICD-10-CM | POA: Diagnosis not present

## 2019-11-27 MED ORDER — GADOBUTROL 1 MMOL/ML IV SOLN
10.0000 mL | Freq: Once | INTRAVENOUS | Status: AC | PRN
  Administered 2019-11-27: 10 mL via INTRAVENOUS

## 2019-12-05 ENCOUNTER — Other Ambulatory Visit: Payer: Self-pay

## 2019-12-05 MED ORDER — GABAPENTIN 100 MG PO CAPS
100.0000 mg | ORAL_CAPSULE | Freq: Every day | ORAL | 1 refills | Status: DC
Start: 2019-12-05 — End: 2020-02-26

## 2019-12-10 ENCOUNTER — Telehealth: Payer: Self-pay

## 2019-12-10 NOTE — Telephone Encounter (Signed)
Spoke with Pt about his MRI results, Pt verbalized understanding

## 2019-12-22 DIAGNOSIS — G464 Cerebellar stroke syndrome: Secondary | ICD-10-CM | POA: Diagnosis not present

## 2019-12-23 ENCOUNTER — Other Ambulatory Visit (INDEPENDENT_AMBULATORY_CARE_PROVIDER_SITE_OTHER): Payer: PPO

## 2019-12-23 ENCOUNTER — Other Ambulatory Visit: Payer: Self-pay

## 2019-12-23 ENCOUNTER — Other Ambulatory Visit: Payer: Self-pay | Admitting: *Deleted

## 2019-12-23 DIAGNOSIS — G459 Transient cerebral ischemic attack, unspecified: Secondary | ICD-10-CM | POA: Diagnosis not present

## 2019-12-23 DIAGNOSIS — E785 Hyperlipidemia, unspecified: Secondary | ICD-10-CM

## 2019-12-23 DIAGNOSIS — I63532 Cerebral infarction due to unspecified occlusion or stenosis of left posterior cerebral artery: Secondary | ICD-10-CM

## 2019-12-23 DIAGNOSIS — I10 Essential (primary) hypertension: Secondary | ICD-10-CM

## 2020-01-01 ENCOUNTER — Telehealth: Payer: Self-pay

## 2020-01-01 NOTE — Telephone Encounter (Signed)
Patient having trouble with Neuropathy, BP is high 160/104 this morning. Went for his D.O.T physical on 12-31-19 could not pass his physical. Will make Matthew Luna appt to be seen.

## 2020-01-01 NOTE — Telephone Encounter (Signed)
Agree 

## 2020-01-02 NOTE — Telephone Encounter (Signed)
Scheduled patient appointment on 01-05-2020

## 2020-01-05 ENCOUNTER — Other Ambulatory Visit: Payer: Self-pay

## 2020-01-05 ENCOUNTER — Ambulatory Visit (INDEPENDENT_AMBULATORY_CARE_PROVIDER_SITE_OTHER): Payer: PPO | Admitting: Family Medicine

## 2020-01-05 VITALS — BP 142/92 | HR 56 | Temp 97.8°F | Resp 17 | Ht 75.0 in | Wt 223.0 lb

## 2020-01-05 DIAGNOSIS — I672 Cerebral atherosclerosis: Secondary | ICD-10-CM | POA: Diagnosis not present

## 2020-01-05 DIAGNOSIS — I10 Essential (primary) hypertension: Secondary | ICD-10-CM | POA: Diagnosis not present

## 2020-01-05 NOTE — Progress Notes (Signed)
Subjective:    Patient ID: Matthew Luna, male    DOB: 1940-06-26, 79 y.o.   MRN: [id number]  10/24/19 Patient had an MRA of the brain along with an MRI of the brain performed in 05/2014.  The results are dictated below: IMPRESSION: MRI HEAD: No acute intracranial process.  Involutional changes. Mild to moderate white matter changes suggest chronic small vessel ischemic disease.  MRA HEAD: High-grade stenosis versus occlusion of proximal RIGHT P1 segment, mid to high-grade stenosis LEFT proximal P2 segment.  Atherosclerosis/stenosis of bilateral superior cerebellar arteries, possible occlusion on the LEFT.  Mild stenosis RIGHT supraclinoid internal carotid artery.  10/24/19  Patient is not sure when but at some point he stopped taking his aspirin and his Crestor.  He is here today because in the last week he has become progressively off balance.  He states that when he stands up or when he tries to walk he feels unsteady on his feet.  He also complains of some blurry vision primarily in his left eye.  He had an eye exam 6 months ago that was normal.  He denies any unilateral weakness, slurred speech, facial droop.  He denies any memory loss.  There is no weakness on his exam today and there is no hyperreflexia or spasticity.  He is able to perform Romberg testing.  He is able to perform finger-to-nose testing.  However heel-to-toe walk is grossly abnormal.  The patient cannot take 1 step heel-to-toe without falling to the side in a dramatic fashion.  He is unable to walk in a straight line.  He demonstrates ataxia unless he walks with a wide-based gait.  He denies any vertigo.  Dix-Hallpike maneuver is negative.  At that time, my plan was: Begin aspirin 81 mg daily immediately.  Resume Crestor 20 mg daily immediately.  Schedule an MRI of the brain along with an MRA of the brain and an MRA of the neck as soon as possible.  11/03/19  IMPRESSION: MRI brain:  1. Punctate focus of  apparent restricted diffusion within the left parietal lobe which may reflect an acute/early subacute infarct or susceptibility artifact arising from the adjacent calvarium. 2. Additional subcentimeter focus of apparent diffusion weighted hyperintensity within the left parietooccipital lobes which is favored to reflect susceptibility artifact arising from the calvarium. A tiny acute/early subacute infarct at this site cannot be excluded. 3. Moderate generalized cerebral atrophy and cerebral white matter chronic small vessel ischemic disease, progressed as compared to the MRI of 05/11/2014. 4. Small chronic infarcts within the right pons and right cerebellar hemisphere are new as compared to the MRI. 5. Unchanged chronic lacunar infarcts within the bilateral cerebral white matter, left thalamus and left cerebellar hemisphere. 6. Ethmoid sinus mucosal thickening. 7. Small bilateral mastoid effusions.  MRA head:  1. Intracranial atherosclerotic disease with multifocal stenoses, most notably as follows. 2. Atherosclerotic irregularity of the M2 and more distal MCA branches bilaterally, progressed as compared to the prior MRA of 05/11/2014. Most notably, high-grade stenoses are now present within multiple proximal left M2 MCA branches. 3. High-grade stenosis versus occlusion of the proximal P1 right PCA, unchanged. 4. Unchanged high-grade stenosis within the proximal P2 left PCA. 5. Severe atherosclerotic irregularity with high-grade stenoses within the proximal superior cerebellar arteries bilaterally, progressed on the right.  Patient is here today with his wife to discuss further.  Therefore there are at least 4 new strokes compared to his MRI from 2017.  There is one subacute infarct that appears  to have happened recently.  There are 2 infarcts near the left parietal lobe and to near the right cerebellum and right pons.  There are also chronic infarcts seen in the left cerebellar  hemisphere as well as left lacunar infarct.  Patient has numerous high-grade stenoses within the branches of the left middle cerebral artery as well as within branches of the right posterior cerebral artery and the left posterior cerebral artery.  At that time, my plan was: I want to maximize medical therapy.  Discontinue aspirin.  Replace with Aggrenox 1 tablet twice daily.  Continue high-dose Crestor.  Blood pressure today is excellent at 124/80.  Consult neurology.  My expectation is that the patient is not a candidate for any intravascular intervention however given his numerous strokes, I would like a second opinion particular given the bilateral posterior cerebral artery stenoses.  Spent more than 30 minutes today with the patient and his wife reviewing the MRI and determining our plan of care.  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  Neurology had recommended being conservative in managing his blood pressure to ensure adequate posterior perfusion.  They were willing to accept a systolic blood pressure between 130 and 150.  I agree with this.  I am concerned that dropping his systolic blood pressure too much will cause cerebral hypoperfusion and lead to dizziness and ataxia.  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  His blood pressure here today is 142/92 and I verified this myself.  Patient is checking his blood pressure at home however he is inconsistent with regards to his self-reported blood pressure.  History is difficult to obtain at times and therefore I do not feel comfortable that he is truly checking his blood pressure every day and that it staying less than 150.  Past Medical History:  Diagnosis Date  . AAA (abdominal aortic aneurysm) ([place])   . Actinic keratosis   . Allergy    mild   .  Anxiety   . CKD (chronic kidney disease) stage 3, GFR 30-59 ml/min (HCC)   . Diverticulosis of colon (without mention of hemorrhage)   . Duodenitis without mention of hemorrhage   . Esophageal reflux   . Hypertrophy of prostate without urinary obstruction and other lower urinary tract symptoms (LUTS)   . Left sided ulcerative (chronic) colitis ([place])   . Mononeuritis of unspecified site   . Neuropathy   . Nonspecific abnormal results of thyroid function study   . Prostate cancer ([place])    cryotherapy 2011 [facility])  . Schatzki's ring   . Sleep apnea    does not use the CPAP  . Stricture and stenosis of esophagus   . Stroke ([place])    left parietal, left parietal/occipital, right cerebellar, right pons  . TIA (transient ischemic attack) 2017   pt states was told mini stroke  . Unspecified essential hypertension    Past Surgical History:  Procedure Laterality Date  . ABDOMINAL AORTIC ANEURYSM REPAIR     2018  . BALLOON DILATION  03/22/2011   Procedure: BALLOON DILATION;  Surgeon: Inda Castle, MD;  Location: [facility];  Service: Endoscopy;  Laterality: N/A;  kelly/ebp  . CHOLECYSTECTOMY    . COLONOSCOPY    . ENDOVASCULAR REPAIR/STENT GRAFT N/A 03/29/2016   Procedure: Endovascular Repair/Stent Graft;  Surgeon: Algernon Huxley, MD;  Location: [facility];  Service: Cardiovascular;  Laterality: N/A;  . ESOPHAGOGASTRODUODENOSCOPY  03/22/2011   Procedure: ESOPHAGOGASTRODUODENOSCOPY (EGD);  Surgeon: Inda Castle, MD;  Location: [facility];  Service: Endoscopy;  Laterality: N/A;  . PROSTATE SURGERY    . UPPER GASTROINTESTINAL ENDOSCOPY     Current Outpatient Medications on File Prior to Visit  Medication Sig Dispense Refill  . BYSTOLIC 10 MG tablet TAKE 1/2 TABLET (5 MG TOTAL) BY MOUTH AT BEDTIME. 45 tablet 3  . gabapentin (NEURONTIN) 100 MG capsule Take 1 capsule (100 mg total) by mouth at bedtime. 90 capsule 1  . omeprazole (PRILOSEC) 40 MG capsule TAKE 1 CAPSULE BY MOUTH EVERY  DAY 90 capsule 1  . rosuvastatin (CRESTOR) 40 MG tablet Take 1 tablet (40 mg total) by mouth daily. 30 tablet 5  . clobetasol cream (TEMOVATE) 6.60 % Apply 1 application topically 2 (two) times daily. (Patient not taking: Reported on 01/05/2020) 45 g 3  . clopidogrel (PLAVIX) 75 MG tablet Take 1 tablet (75 mg total) by mouth daily. (Patient not taking: Reported on 01/05/2020) 60 tablet 0  . escitalopram (LEXAPRO) 10 MG tablet TAKE 1 TABLET BY MOUTH EVERY DAY (Patient not taking: Reported on 01/05/2020) 90 tablet 3   No current facility-administered medications on file prior to visit.   Allergies  Allergen Reactions  . Metronidazole Anaphylaxis  . Amoxicillin-Pot Clavulanate Other (See Comments)    UNKNOWN  . Ciprofloxacin Other (See Comments)    UNKNOWN  . Clindamycin/Lincomycin Other (See Comments)    Nausea, stomach pain, sweats  . Mesalamine     REACTION: Intolerance   Social History   Socioeconomic History  . Marital status: Married    Spouse name: Not on file  . Number of children: 1  . Years of education: Some colg  . Highest education level: Not on file  Occupational History  . Occupation: Retired   Tobacco Use  . Smoking status: Never Smoker  . Smokeless tobacco: Never Used  Vaping Use  . Vaping Use: Never used  Substance and Sexual Activity  . Alcohol use: Yes    Alcohol/week: 14.0 standard drinks    Types: 14 Glasses of wine per week    Comment: per pt -drinks 10 oz red wine every night!  . Drug use: No  . Sexual activity: Never  Other Topics Concern  . Not on file  Social History Narrative   1 -2 cups of coffee a day, some tea consumption    Left handed   Lives with wife   Social Determinants of Health   Financial Resource Strain:   . Difficulty of Paying Living Expenses: Not on file  Food Insecurity:   . Worried About Charity fundraiser in the Last Year: Not on file  . Ran Out of Food in the Last Year: Not on file  Transportation Needs:   . Lack of  Transportation (Medical): Not on file  . Lack of Transportation (Non-Medical): Not on file  Physical Activity:   . Days of Exercise per Week: Not on file  . Minutes of Exercise per Session: Not on file  Stress:   . Feeling of Stress : Not on file  Social Connections:   . Frequency of Communication with Friends and Family: Not on file  . Frequency of Social Gatherings with Friends and Family: Not on file  . Attends Religious Services: Not on file  . Active Member of Clubs or Organizations:  Not on file  . Attends Archivist Meetings: Not on file  . Marital Status: Not on file  Intimate Partner Violence:   . Fear of Current or Ex-Partner: Not on file  . Emotionally Abused: Not on file  . Physically Abused: Not on file  . Sexually Abused: Not on file      Review of Systems  All other systems reviewed and are negative.      Objective:   Physical Exam Vitals reviewed.  Constitutional:      General: He is not in acute distress.    Appearance: He is well-developed. He is not diaphoretic.  Neck:     Thyroid: No thyromegaly.     Vascular: No JVD.  Cardiovascular:     Rate and Rhythm: Normal rate and regular rhythm.     Heart sounds: Normal heart sounds. No murmur heard.  No friction rub. No gallop.   Pulmonary:     Effort: Pulmonary effort is normal. No respiratory distress.     Breath sounds: Normal breath sounds. No wheezing or rales.  Genitourinary:    Prostate: Not enlarged and not tender.  Lymphadenopathy:     Cervical: No cervical adenopathy.  Neurological:     Mental Status: He is alert and oriented to person, place, and time.     Motor: Motor function is intact. No tremor, atrophy, abnormal muscle tone or pronator drift.     Coordination: Romberg sign negative. Heel to [facility] Test abnormal. Finger-Nose-Finger Test normal.     Deep Tendon Reflexes: Reflexes are normal and symmetric.           Assessment & Plan:  Intracranial atherosclerosis  Benign  essential HTN  History of cerebrovascular accident  I am willing to accept a systolic blood pressure less than 150.  However I do not want a systolic blood pressure less than 130.  Therefore of asked the patient to check his blood pressure at home every day in the morning and in the afternoon and record these values and bring it to me on Thursday or Friday.  If his systolic blood pressure is greater than 150 consistently, I will add additional medication at that time.  However if less than 150 the majority the time I would not make additional reductions in his blood pressures due to the reasons outlined above.

## 2020-01-12 ENCOUNTER — Telehealth: Payer: Self-pay | Admitting: Family Medicine

## 2020-01-12 NOTE — Telephone Encounter (Signed)
Patient had left vm asking that we call him back for blood pressure readings:   They are as followed: 01/08/20 AM 164/105,  01/09/20 AM 175/107 01/09/20 PM 155/97 12/11/21AM 162/94 12/13/21AM  178/112  CB # [phone]

## 2020-01-13 ENCOUNTER — Other Ambulatory Visit: Payer: Self-pay | Admitting: Family Medicine

## 2020-01-13 MED ORDER — VALSARTAN 160 MG PO TABS: 160.0000 mg | ORAL_TABLET | Freq: Every day | ORAL | 5 refills | Status: DC

## 2020-01-13 NOTE — Telephone Encounter (Signed)
Called pt to discuss bp readings, no answer, no voicemail

## 2020-01-13 NOTE — Telephone Encounter (Signed)
Add diovan 160 mg poqday for htn.

## 2020-01-13 NOTE — Telephone Encounter (Signed)
Pt submitted bp reading as instructed. Per encounter note, If his systolic blood pressure is greater than 150 consistently, pt will need additional medication.

## 2020-01-14 NOTE — Telephone Encounter (Signed)
Rx sent to pharmacy for patient.

## 2020-02-25 ENCOUNTER — Telehealth: Payer: Self-pay | Admitting: Family Medicine

## 2020-02-25 NOTE — Telephone Encounter (Signed)
Pt LVM, states he has been experiencing some neuropathy like pain in feet  I noted he had recent OV in December and also recent CVA   He is on gabapentin Send to PCP to see if OV needed or if they have discussed this already

## 2020-02-26 MED ORDER — GABAPENTIN 100 MG PO CAPS
100.0000 mg | ORAL_CAPSULE | Freq: Three times a day (TID) | ORAL | 1 refills | Status: DC
Start: 2020-02-26 — End: 2020-11-02

## 2020-02-26 NOTE — Addendum Note (Signed)
Addended by: Sheral Flow on: 02/26/2020 12:28 PM   Modules accepted: Orders

## 2020-02-26 NOTE — Telephone Encounter (Signed)
Call placed to patient and patient made aware.   Prescription sent to pharmacy.  

## 2020-02-26 NOTE — Telephone Encounter (Signed)
I would try increasing gabapentin to 300 mg potid PRN neuropathy pain.

## 2020-03-09 ENCOUNTER — Ambulatory Visit (INDEPENDENT_AMBULATORY_CARE_PROVIDER_SITE_OTHER): Payer: PPO | Admitting: Family Medicine

## 2020-03-09 ENCOUNTER — Other Ambulatory Visit: Payer: Self-pay

## 2020-03-09 VITALS — BP 138/80 | HR 70 | Temp 97.7°F | Resp 16 | Ht 75.0 in | Wt 222.0 lb

## 2020-03-09 DIAGNOSIS — B028 Zoster with other complications: Secondary | ICD-10-CM | POA: Diagnosis not present

## 2020-03-09 MED ORDER — VALACYCLOVIR HCL 1 G PO TABS: 1000.0000 mg | ORAL_TABLET | Freq: Three times a day (TID) | ORAL | 0 refills | Status: DC

## 2020-03-09 NOTE — Progress Notes (Signed)
Subjective:    Patient ID: Matthew Luna, male    DOB: 20-Apr-1940, 80 y.o.   MRN: [id number] 12/20/18     Patient thinks he has a sunburn.  He mowed the yard for 30 minutes 5 days ago.  The following day this rash appeared on the crown of his head.  It is isolated just to this area.  It does not extend down into the hair.  The rash is painful red and hot.  There are numerous small 1 mm pustules and vesicles.  The skin burns and stings.  He states that the day he mowed, he used [employer] to wash his hair.  The rash appeared the following day.  Therefore he believes the reaction is either an allergic reaction to [facility] or sunburn.  However the rash to me appears to be widespread shingles with a possible secondary cellulitis.  A chemical burn would also appear similar to this however I do not believe [facility] would likely cause a chemical burn and he is no longer using the product.  I do not believe that this is a sunburn given the length of exposure.  At that time, my plan was: Differential diagnosis includes burn, chemical burn/allergic reaction, shingles, cellulitis.  Given his presentation and symptoms I believe this is shingles with secondary cellulitis.  I will treat the patient with Valtrex 1 g p.o. 3 times daily for 7 days.  I will also treat the cellulitis with Keflex 500 mg p.o. 3 times daily for 7 days.  Recommended the patient apply Vaseline as a soothing ointment to help with the pain and blistering on his scalp.  Avoid contact with sun and [place].  Reassess in 48 hours sooner if worsening.  Decreased dose of gabapentin from 300 mg at night to 100 mg at night for neuropathy as 300 mg caused dizziness.  05/29/19 I did not hear back from the patient after his last visit in November.  He states that the rash went away.  He states that 3 days ago, the burning and the pain in the rash returned.   Picture does not do it just this.  The entire scalp is erythematous.  There is thick  vesicular-like crust widespread throughout the scalp.  It is only in the sun exposed area.  It does not progress into the hair.  It is located nowhere else on his body.  This certainly raises the concern of some type of photosensitive eruption.  Patient denies putting any kind of oral or cream on his scalp.  He did use some kind of dandruff shampoo prior to the eruption occurring again.  At that time, my plan was: Patient is uncertain of whether the rash went away.  I find this concerning.  It would be impossible to not see this rash on your head every day.  To me it looks exactly as it did when I saw it for the first time in November.  Therefore I do not believe that this is shingles or cellulitis.  I believe my initial assessment was wrong.  I am certainly concerned about a photosensitive drug eruption, an allergic reaction to some topical product, a chemical burn, or malignancy.  I will treat the patient with prednisone.  This should certainly help photosensitive drug eruption/allergic reaction as well as a chemical burn.  If persistent, I would recommend a biopsy/referral to dermatology.  I will see the patient back in 1 week to reassess.  However I reviewed  all of his medications and none of his medications appear to be a photosensitizing medication.  06/05/19   The erythema has faded completely from his scalp.  He does have hyperkeratosis and peeling dry skin that is brown and hyperpigmented all over his entire scalp but the erythema is much better.  Patient is convinced that this is some type of allergic reaction to his shampoo.  He states that this is happened twice now.  He believes now that the rash did go away when I saw him initially in November.  At that time, my plan was: Erythema is much better however he continues to have hyperkeratotic skin changes all throughout the scalp and peeling dry skin similar to a severe sunburn.  I will give the patient clobetasol cream to apply twice a day for 10  days and then recheck the patient in 10 days.  Avoid use of that shampoo in the future.  If persistent in 10 days, I would recommend dermatology consultation.  03/09/20    Patient reports a 2-day history of a rash above his right eye.  The rash is growing in a line.  The line consists of erythematous clumps of papules that are in clusters.  This is following the border of the V1 dermatome on the right-hand side.  He states that it burns and stings.  It is not involving the eye.  There is no conjunctival erythema.  He denies any grit or pain in the right eye.  However I do believe that this is shingles. Past Medical History:  Diagnosis Date  . AAA (abdominal aortic aneurysm) ([place])   . Actinic keratosis   . Allergy    mild   . Anxiety   . CKD (chronic kidney disease) stage 3, GFR 30-59 ml/min (HCC)   . Diverticulosis of colon (without mention of hemorrhage)   . Duodenitis without mention of hemorrhage   . Esophageal reflux   . Hypertrophy of prostate without urinary obstruction and other lower urinary tract symptoms (LUTS)   . Left sided ulcerative (chronic) colitis ([place])   . Mononeuritis of unspecified site   . Neuropathy   . Nonspecific abnormal results of thyroid function study   . Prostate cancer ([place])    cryotherapy 2011 [facility])  . Schatzki's ring   . Sleep apnea    does not use the CPAP  . Stricture and stenosis of esophagus   . Stroke ([place])    left parietal, left parietal/occipital, right cerebellar, right pons  . TIA (transient ischemic attack) 2017   pt states was told mini stroke  . Unspecified essential hypertension    Past Surgical History:  Procedure Laterality Date  . ABDOMINAL AORTIC ANEURYSM REPAIR     2018  . BALLOON DILATION  03/22/2011   Procedure: BALLOON DILATION;  Surgeon: Inda Castle, MD;  Location: [facility];  Service: Endoscopy;  Laterality: N/A;  kelly/ebp  . CHOLECYSTECTOMY    . COLONOSCOPY    . ENDOVASCULAR REPAIR/STENT GRAFT N/A 03/29/2016    Procedure: Endovascular Repair/Stent Graft;  Surgeon: Algernon Huxley, MD;  Location: [place] [facility];  Service: Cardiovascular;  Laterality: N/A;  . ESOPHAGOGASTRODUODENOSCOPY  03/22/2011   Procedure: ESOPHAGOGASTRODUODENOSCOPY (EGD);  Surgeon: Inda Castle, MD;  Location: [facility];  Service: Endoscopy;  Laterality: N/A;  . PROSTATE SURGERY    . UPPER GASTROINTESTINAL ENDOSCOPY     Current Outpatient Medications on File Prior to Visit  Medication Sig Dispense Refill  . BYSTOLIC 10 MG tablet TAKE  1/2 TABLET (5 MG TOTAL) BY MOUTH AT BEDTIME. 45 tablet 3  . clobetasol cream (TEMOVATE) 1.61 % Apply 1 application topically 2 (two) times daily. (Patient not taking: Reported on 01/05/2020) 45 g 3  . clopidogrel (PLAVIX) 75 MG tablet Take 1 tablet (75 mg total) by mouth daily. (Patient not taking: Reported on 01/05/2020) 60 tablet 0  . escitalopram (LEXAPRO) 10 MG tablet TAKE 1 TABLET BY MOUTH EVERY DAY (Patient not taking: Reported on 01/05/2020) 90 tablet 3  . gabapentin (NEURONTIN) 100 MG capsule Take 1 capsule (100 mg total) by mouth 3 (three) times daily. 90 capsule 1  . omeprazole (PRILOSEC) 40 MG capsule TAKE 1 CAPSULE BY MOUTH EVERY DAY 90 capsule 1  . rosuvastatin (CRESTOR) 40 MG tablet Take 1 tablet (40 mg total) by mouth daily. 30 tablet 5  . valsartan (DIOVAN) 160 MG tablet Take 1 tablet (160 mg total) by mouth daily. 30 tablet 5   No current facility-administered medications on file prior to visit.   Allergies  Allergen Reactions  . Metronidazole Anaphylaxis  . Amoxicillin-Pot Clavulanate Other (See Comments)    UNKNOWN  . Ciprofloxacin Other (See Comments)    UNKNOWN  . Clindamycin/Lincomycin Other (See Comments)    Nausea, stomach pain, sweats  . Mesalamine     REACTION: Intolerance   Social History   Socioeconomic History  . Marital status: Married    Spouse name: Not on file  . Number of children: 1  . Years of education: Some colg  . Highest education level: Not  on file  Occupational History  . Occupation: Retired   Tobacco Use  . Smoking status: Never Smoker  . Smokeless tobacco: Never Used  Vaping Use  . Vaping Use: Never used  Substance and Sexual Activity  . Alcohol use: Yes    Alcohol/week: 14.0 standard drinks    Types: 14 Glasses of wine per week    Comment: per pt -drinks 10 oz red wine every night!  . Drug use: No  . Sexual activity: Never  Other Topics Concern  . Not on file  Social History Narrative   1 -2 cups of coffee a day, some tea consumption    Left handed   Lives with wife   Social Determinants of Health   Financial Resource Strain: Not on file  Food Insecurity: Not on file  Transportation Needs: Not on file  Physical Activity: Not on file  Stress: Not on file  Social Connections: Not on file  Intimate Partner Violence: Not on file      Review of Systems  All other systems reviewed and are negative.      Objective:   Physical Exam Vitals reviewed.  Constitutional:      General: He is not in acute distress.    Appearance: He is well-developed. He is not diaphoretic.  HENT:     Head:   Cardiovascular:     Rate and Rhythm: Normal rate and regular rhythm.     Heart sounds: Normal heart sounds. No murmur heard. No friction rub. No gallop.   Pulmonary:     Effort: Pulmonary effort is normal. No respiratory distress.     Breath sounds: Normal breath sounds. No wheezing or rales.  Abdominal:     General: Bowel sounds are normal.     Palpations: Abdomen is soft.  Skin:    Findings: Erythema and rash present. Rash is papular and vesicular.        Assessment & Plan:  Herpes zoster with complication  Begin Valtrex 1 g p.o. 3 times daily for 7 days.  Recheck immediately if he develops pain or redness in the right eye or blurry vision.  Right now the pain is minimal so he does not need pain medication.  Also recheck immediately if the rash spreads to other areas as this would contradict my diagnosis of  shingles

## 2020-03-18 ENCOUNTER — Telehealth: Payer: Self-pay | Admitting: Family Medicine

## 2020-03-18 MED ORDER — TRIAMCINOLONE ACETONIDE 0.025 % EX OINT: 1.0000 "application " | TOPICAL_OINTMENT | Freq: Two times a day (BID) | CUTANEOUS | 0 refills | Status: DC

## 2020-03-18 NOTE — Telephone Encounter (Signed)
Refill TRIAMCINOLONE   --- [id number], [place] - [address]

## 2020-03-18 NOTE — Telephone Encounter (Signed)
Prescription sent to pharmacy.

## 2020-05-03 NOTE — Progress Notes (Signed)
NEUROLOGY FOLLOW UP OFFICE NOTE  QUINTAVIS BRANDS [id number]  Assessment/Plan:   1.  Left parietooccipital infarcts in setting of left PCA stenosis 2.  Hypertension 3.  Hyperlipidemia  1.  Secondary stroke prevention as managed by PCP: - ASA 81mg  daily - Crestor 40mg  daily.  LDL goal less than 70 - blood pressure control.  I would like to get CTA of head to confirm the left PCA stenosis (as MRA may overcall stenosis).  If truly high-grade stenosis, then goal blood pressure should be a little elevated to ensure adequate perfusion (SBP goal 130-150).  If no stenosis seen, then normalized blood pressure goal. - glycemic control. Hgb A1c goal less than 7 2.  Follow up 6 months  Subjective:  Matthew Luna. Matthew Luna is a 79 year old left-handed male with HTN, HLD, AAA, sleep apnea, CKD and history of stroke who follows up for stroke.  UPDATE: Current medications:  ASA 81mg  daily, Bystolic, Crestor 40mg   Echocardiogram with bubble study on 11/21/2019 showed EF 65-70% with no cardiac source of embolus.  MRA of the neck on 11/27/2019 personally reviewed showed 50% stenosis of distal left vertebral artery just proximal to basilar artery but otherwise unremarkable.   14 day cardiac event monitor in November 2021 showed no atrial fibrillation.  Last visit, he endorsed blurred vision, which has since resolved.  HISTORY: In September 2021, he started noticing problems with his balance, dizziness and headache and blurred vision.  When he stands up or tries to walk, he feels unsteady on his feet.  No lightheadedness, spinning sensation, slurred speech, unilateral weakness, numbness or facial droop.  MRI of brain without contrast on 10/28/2019 personally reviewed showed punctate foci representing susceptibility artifact vs acute/subacute infarcts within the left parietal lobe and left parietooccipital lobes as well as small chronic infarcts within the right pons and bilateral cerebellar hemispheres and within  the bilateral cerebral white matter and left thalamus.  MRA of head personally reviewed showed intracranial atherosclerotic disease with multifocal stenoses involving the bilateral M2 and distal MCA branches with high-grade stenosis on the left, high-grade stenosis vs occlusion of proximal P1 right PCA, high-grade stenosis within proximal P2 left PCA and high-grade stenoses within bilateral proximal superior cerebellar arteries.  LDL from 10/24/2019 was 130.  He was switched from ASA 81mg  to Aggrenox about 2 weeks ago.  However, he had been noncompliant with the ASA (not taking it regularly).  No change in Crestor.  Still feels unsteady on feet.  Since starting Aggrenox, notes increased headaches.  PAST MEDICAL HISTORY: Past Medical History:  Diagnosis Date  . AAA (abdominal aortic aneurysm) ([place])   . Actinic keratosis   . Allergy    mild   . Anxiety   . CKD (chronic kidney disease) stage 3, GFR 30-59 ml/min (HCC)   . Diverticulosis of colon (without mention of hemorrhage)   . Duodenitis without mention of hemorrhage   . Esophageal reflux   . Hypertrophy of prostate without urinary obstruction and other lower urinary tract symptoms (LUTS)   . Left sided ulcerative (chronic) colitis ([place])   . Mononeuritis of unspecified site   . Neuropathy   . Nonspecific abnormal results of thyroid function study   . Prostate cancer ([place])    cryotherapy 2011 [facility])  . Schatzki's ring   . Sleep apnea    does not use the CPAP  . Stricture and stenosis of esophagus   . Stroke [facility])    left parietal, left parietal/occipital, right cerebellar, right  pons  . TIA (transient ischemic attack) 2017   pt states was told mini stroke  . Unspecified essential hypertension     MEDICATIONS: Current Outpatient Medications on File Prior to Visit  Medication Sig Dispense Refill  . BYSTOLIC 10 MG tablet TAKE 1/2 TABLET (5 MG TOTAL) BY MOUTH AT BEDTIME. 45 tablet 3  . clobetasol cream (TEMOVATE) 4.19 % Apply 1  application topically 2 (two) times daily. (Patient not taking: No sig reported) 45 g 3  . clopidogrel (PLAVIX) 75 MG tablet Take 1 tablet (75 mg total) by mouth daily. (Patient not taking: No sig reported) 60 tablet 0  . escitalopram (LEXAPRO) 10 MG tablet TAKE 1 TABLET BY MOUTH EVERY DAY (Patient not taking: No sig reported) 90 tablet 3  . gabapentin (NEURONTIN) 100 MG capsule Take 1 capsule (100 mg total) by mouth 3 (three) times daily. 90 capsule 1  . omeprazole (PRILOSEC) 40 MG capsule TAKE 1 CAPSULE BY MOUTH EVERY DAY 90 capsule 1  . rosuvastatin (CRESTOR) 40 MG tablet Take 1 tablet (40 mg total) by mouth daily. 30 tablet 5  . triamcinolone (KENALOG) 0.025 % ointment Apply 1 application topically 2 (two) times daily. 30 g 0  . valACYclovir (VALTREX) 1000 MG tablet Take 1 tablet (1,000 mg total) by mouth 3 (three) times daily. 21 tablet 0  . valsartan (DIOVAN) 160 MG tablet Take 1 tablet (160 mg total) by mouth daily. 30 tablet 5   No current facility-administered medications on file prior to visit.    ALLERGIES: Allergies  Allergen Reactions  . Metronidazole Anaphylaxis  . Amoxicillin-Pot Clavulanate Other (See Comments)    UNKNOWN  . Ciprofloxacin Other (See Comments)    UNKNOWN  . Clindamycin/Lincomycin Other (See Comments)    Nausea, stomach pain, sweats  . Mesalamine     REACTION: Intolerance    FAMILY HISTORY: Family History  Problem Relation Age of Onset  . Cancer Mother        unknown type  . Snoring Father   . Anesthesia problems Neg Hx   . Hypotension Neg Hx   . Malignant hyperthermia Neg Hx   . Pseudochol deficiency Neg Hx   . Colon cancer Neg Hx   . Colon polyps Neg Hx   . Esophageal cancer Neg Hx   . Stomach cancer Neg Hx   . Rectal cancer Neg Hx       Objective:  Blood pressure (!) 153/105, pulse (!) 59, resp. rate 18, height 6\' 3"  (1.905 m), weight 216 lb (98 kg), SpO2 100 %. General: No acute distress.  Patient appears well-groomed.   Head:   Normocephalic/atraumatic Eyes:  Fundi examined but not visualized Neck: supple, no paraspinal tenderness, full range of motion Heart:  Regular rate and rhythm Lungs:  Clear to auscultation bilaterally Back: No paraspinal tenderness Neurological Exam: alert and oriented to person, place, and time. Attention span and concentration intact, recent and remote memory intact, fund of knowledge intact.  Speech fluent and not dysarthric, language intact.  CN II-XII intact. Bulk and tone normal, muscle strength 5/5 throughout.  Sensation to light touch intact.  Deep tendon reflexes 2+ throughout, toes downgoing.  Finger to nose and heel to shin testing intact.  Gait normal, Romberg negative.   Metta Clines, DO  CC:  Matthew Luo, MD

## 2020-05-04 ENCOUNTER — Encounter: Payer: Self-pay | Admitting: Neurology

## 2020-05-04 ENCOUNTER — Other Ambulatory Visit: Payer: Self-pay

## 2020-05-04 ENCOUNTER — Ambulatory Visit: Payer: PPO | Admitting: Neurology

## 2020-05-04 VITALS — BP 153/105 | HR 59 | Resp 18 | Ht 75.0 in | Wt 216.0 lb

## 2020-05-04 DIAGNOSIS — E785 Hyperlipidemia, unspecified: Secondary | ICD-10-CM | POA: Diagnosis not present

## 2020-05-04 DIAGNOSIS — I10 Essential (primary) hypertension: Secondary | ICD-10-CM

## 2020-05-04 DIAGNOSIS — I63532 Cerebral infarction due to unspecified occlusion or stenosis of left posterior cerebral artery: Secondary | ICD-10-CM

## 2020-05-04 NOTE — Patient Instructions (Addendum)
1.  Will check CTA of head for more accurate look at the blood vessels 2.  No change in management  We have sent a referral to [facility]  for your CTA and they will call you directly to schedule your appointment

## 2020-05-05 ENCOUNTER — Telehealth: Payer: Self-pay

## 2020-05-05 NOTE — Telephone Encounter (Signed)
Called patient and was unable to leave a message on both home and cell phone numbers. Need to inform patient that he has a CTA scheduled at [facility] on May 19, 2020 at 12:00pm. Also inform patient: NPO 4 hours prior. Check in at out patient registration radiology.

## 2020-05-06 ENCOUNTER — Telehealth: Payer: Self-pay

## 2020-05-06 NOTE — Telephone Encounter (Signed)
Patient advised of CTA at [facility] at 12:00 noon.

## 2020-05-19 ENCOUNTER — Ambulatory Visit (HOSPITAL_COMMUNITY)
Admission: RE | Admit: 2020-05-19 | Discharge: 2020-05-19 | Disposition: A | Discharge status: Home / Self Care | Payer: PPO | Source: Ambulatory Visit | Attending: Neurology | Admitting: Neurology

## 2020-05-19 ENCOUNTER — Other Ambulatory Visit: Payer: Self-pay

## 2020-05-19 DIAGNOSIS — I6389 Other cerebral infarction: Secondary | ICD-10-CM | POA: Diagnosis not present

## 2020-05-19 DIAGNOSIS — I10 Essential (primary) hypertension: Secondary | ICD-10-CM | POA: Diagnosis not present

## 2020-05-19 DIAGNOSIS — I63532 Cerebral infarction due to unspecified occlusion or stenosis of left posterior cerebral artery: Secondary | ICD-10-CM | POA: Diagnosis not present

## 2020-05-19 DIAGNOSIS — E785 Hyperlipidemia, unspecified: Secondary | ICD-10-CM | POA: Diagnosis not present

## 2020-05-19 DIAGNOSIS — I672 Cerebral atherosclerosis: Secondary | ICD-10-CM | POA: Diagnosis not present

## 2020-05-19 DIAGNOSIS — G9389 Other specified disorders of brain: Secondary | ICD-10-CM | POA: Diagnosis not present

## 2020-05-19 DIAGNOSIS — I63533 Cerebral infarction due to unspecified occlusion or stenosis of bilateral posterior cerebral arteries: Secondary | ICD-10-CM | POA: Diagnosis not present

## 2020-05-19 LAB — POCT I-STAT CREATININE: Creatinine, Ser: 1.8 mg/dL — ABNORMAL HIGH (ref 0.61–1.24)

## 2020-05-19 MED ORDER — IOHEXOL 350 MG/ML SOLN
60.0000 mL | Freq: Once | INTRAVENOUS | Status: AC | PRN
  Administered 2020-05-19: 60 mL via INTRAVENOUS

## 2020-05-20 ENCOUNTER — Encounter: Payer: Self-pay | Admitting: Family Medicine

## 2020-05-20 ENCOUNTER — Ambulatory Visit (INDEPENDENT_AMBULATORY_CARE_PROVIDER_SITE_OTHER): Payer: PPO | Admitting: Family Medicine

## 2020-05-20 ENCOUNTER — Other Ambulatory Visit: Payer: Self-pay

## 2020-05-20 VITALS — BP 148/78 | HR 80 | Temp 98.7°F | Resp 16 | Ht 75.0 in | Wt 213.0 lb

## 2020-05-20 DIAGNOSIS — L570 Actinic keratosis: Secondary | ICD-10-CM

## 2020-05-20 MED ORDER — DOXYCYCLINE HYCLATE 100 MG PO TABS: 100.0000 mg | ORAL_TABLET | Freq: Two times a day (BID) | ORAL | 0 refills | Status: DC

## 2020-05-20 NOTE — Progress Notes (Signed)
Subjective:    Patient ID: Matthew Luna, male    DOB: May 15, 1940, 80 y.o.   MRN: [id number]  Patient presents today with inflamed bumps on his neck right at his hairline.  There are 2 erythematous papules.  1 has been scratched to the point that it is bleeding.  It is indurated red and firm.  Is roughly 6 mm in size.  The other is a nodular area that feels like an ingrown hair that is also about 5 mm in size.  He then has numerous erythematous, pink scaly patches on his right temple and left temple and crown of his head.  These appear to be actinic keratoses or precancers.  He is here today requesting treatment. Past Medical History:  Diagnosis Date  . AAA (abdominal aortic aneurysm) ([place])   . Actinic keratosis   . Allergy    mild   . Anxiety   . CKD (chronic kidney disease) stage 3, GFR 30-59 ml/min (HCC)   . Diverticulosis of colon (without mention of hemorrhage)   . Duodenitis without mention of hemorrhage   . Esophageal reflux   . Hypertrophy of prostate without urinary obstruction and other lower urinary tract symptoms (LUTS)   . Left sided ulcerative (chronic) colitis ([place])   . Mononeuritis of unspecified site   . Neuropathy   . Nonspecific abnormal results of thyroid function study   . Prostate cancer ([place])    cryotherapy 2011 [facility])  . Schatzki's ring   . Sleep apnea    does not use the CPAP  . Stricture and stenosis of esophagus   . Stroke ([place])    left parietal, left parietal/occipital, right cerebellar, right pons  . TIA (transient ischemic attack) 2017   pt states was told mini stroke  . Unspecified essential hypertension    Past Surgical History:  Procedure Laterality Date  . ABDOMINAL AORTIC ANEURYSM REPAIR     2018  . BALLOON DILATION  03/22/2011   Procedure: BALLOON DILATION;  Surgeon: Inda Castle, MD;  Location: [facility];  Service: Endoscopy;  Laterality: N/A;  kelly/ebp  . CHOLECYSTECTOMY    . COLONOSCOPY    . ENDOVASCULAR REPAIR/STENT GRAFT N/A  03/29/2016   Procedure: Endovascular Repair/Stent Graft;  Surgeon: Algernon Huxley, MD;  Location: [place] [facility];  Service: Cardiovascular;  Laterality: N/A;  . ESOPHAGOGASTRODUODENOSCOPY  03/22/2011   Procedure: ESOPHAGOGASTRODUODENOSCOPY (EGD);  Surgeon: Inda Castle, MD;  Location: [facility];  Service: Endoscopy;  Laterality: N/A;  . PROSTATE SURGERY    . UPPER GASTROINTESTINAL ENDOSCOPY     Current Outpatient Medications on File Prior to Visit  Medication Sig Dispense Refill  . BYSTOLIC 10 MG tablet TAKE 1/2 TABLET (5 MG TOTAL) BY MOUTH AT BEDTIME. 45 tablet 3  . citalopram (CELEXA) 10 MG tablet Take 10 mg by mouth daily.    Marland Kitchen gabapentin (NEURONTIN) 100 MG capsule Take 1 capsule (100 mg total) by mouth 3 (three) times daily. (Patient taking differently: Take 100 mg by mouth once. Takes one tab daily) 90 capsule 1  . rosuvastatin (CRESTOR) 40 MG tablet Take 1 tablet (40 mg total) by mouth daily. 30 tablet 5  . valsartan (DIOVAN) 160 MG tablet Take 1 tablet (160 mg total) by mouth daily. (Patient not taking: Reported on 05/20/2020) 30 tablet 5   No current facility-administered medications on file prior to visit.   Allergies  Allergen Reactions  . Metronidazole Anaphylaxis  . Amoxicillin-Pot Clavulanate Other (See Comments)  UNKNOWN  . Ciprofloxacin Other (See Comments)    UNKNOWN  . Clindamycin/Lincomycin Other (See Comments)    Nausea, stomach pain, sweats  . Mesalamine     REACTION: Intolerance   Social History   Socioeconomic History  . Marital status: Married    Spouse name: Not on file  . Number of children: 1  . Years of education: Some colg  . Highest education level: Not on file  Occupational History  . Occupation: Retired   Tobacco Use  . Smoking status: Never Smoker  . Smokeless tobacco: Never Used  Vaping Use  . Vaping Use: Never used  Substance and Sexual Activity  . Alcohol use: Yes    Alcohol/week: 14.0 standard drinks    Types: 14 Glasses of  wine per week    Comment: per pt -drinks 10 oz red wine every night!  . Drug use: No  . Sexual activity: Never  Other Topics Concern  . Not on file  Social History Narrative   1 -2 cups of coffee a day, some tea consumption    Left handed   Lives with wife   Social Determinants of Health   Financial Resource Strain: Not on file  Food Insecurity: Not on file  Transportation Needs: Not on file  Physical Activity: Not on file  Stress: Not on file  Social Connections: Not on file  Intimate Partner Violence: Not on file      Review of Systems  All other systems reviewed and are negative.      Objective:   Physical Exam Vitals reviewed.  Constitutional:      General: He is not in acute distress.    Appearance: He is well-developed. He is not diaphoretic.  Neck:     Thyroid: No thyromegaly.     Vascular: No JVD.  Cardiovascular:     Rate and Rhythm: Normal rate and regular rhythm.     Heart sounds: Normal heart sounds. No murmur heard. No friction rub. No gallop.   Pulmonary:     Effort: Pulmonary effort is normal. No respiratory distress.     Breath sounds: Normal breath sounds. No wheezing or rales.  Abdominal:     General: Bowel sounds are normal. There is no distension.     Palpations: Abdomen is soft. There is no mass.     Tenderness: There is no guarding or rebound.  Genitourinary:    Prostate: Normal. Not enlarged and not tender.  Lymphadenopathy:     Cervical: No cervical adenopathy.  Neurological:     Mental Status: He is alert and oriented to person, place, and time.     Cranial Nerves: No cranial nerve deficit.     Sensory: Sensory deficit present.     Motor: Motor function is intact. No weakness, tremor, atrophy, abnormal muscle tone or pronator drift.     Coordination: Romberg sign negative. Coordination normal. Heel to Shin Test abnormal. Finger-Nose-Finger Test normal.     Deep Tendon Reflexes: Reflexes are normal and symmetric.            Assessment & Plan:  Actinic keratosis - Plan: Ambulatory referral to Dermatology I think those are picker's nodules on the back of his neck however I believe that 1 is secondarily infected so we will treat this with doxycycline 100 mg twice a day for 7 days.  I believe the rash on his forehead his temples and his scalp is most likely actinic keratoses and I have recommended a referral to dermatology  for possible Efudex.

## 2020-05-21 ENCOUNTER — Telehealth: Payer: Self-pay | Admitting: Neurology

## 2020-05-21 NOTE — Telephone Encounter (Signed)
Pt advised of his CT results.

## 2020-05-21 NOTE — Telephone Encounter (Signed)
Patient called in returning our call

## 2020-05-21 NOTE — Progress Notes (Signed)
Pt advised of his CT results.

## 2020-05-21 NOTE — Progress Notes (Signed)
Tried calling pt, No answer. Uable to lvm.

## 2020-06-17 ENCOUNTER — Encounter: Payer: Self-pay | Admitting: Internal Medicine

## 2020-06-17 DIAGNOSIS — L02821 Furuncle of head [any part, except face]: Secondary | ICD-10-CM | POA: Diagnosis not present

## 2020-06-17 DIAGNOSIS — L570 Actinic keratosis: Secondary | ICD-10-CM | POA: Diagnosis not present

## 2020-06-17 DIAGNOSIS — B9689 Other specified bacterial agents as the cause of diseases classified elsewhere: Secondary | ICD-10-CM | POA: Diagnosis not present

## 2020-06-17 DIAGNOSIS — D0439 Carcinoma in situ of skin of other parts of face: Secondary | ICD-10-CM | POA: Diagnosis not present

## 2020-06-17 DIAGNOSIS — C44619 Basal cell carcinoma of skin of left upper limb, including shoulder: Secondary | ICD-10-CM | POA: Diagnosis not present

## 2020-06-17 DIAGNOSIS — X32XXXA Exposure to sunlight, initial encounter: Secondary | ICD-10-CM | POA: Diagnosis not present

## 2020-07-26 ENCOUNTER — Telehealth: Payer: Self-pay | Admitting: *Deleted

## 2020-07-26 NOTE — Chronic Care Management (AMB) (Signed)
  Chronic Care Management   Note  07/26/2020 Name: CAYLIN RABY MRN: [id number] DOB: April 12, 1940  BODE PIEPER is a 81 y.o. year old male who is a primary care patient of Dennard Schaumann, Cammie Mcgee, MD. I reached out to Jeremy Johann by phone today in response to a referral sent by Mr. Griffen Frayne Kakos's PCP, Dr. Dennard Schaumann      Mr. Tolen was given information about Chronic Care Management services today including:  CCM service includes personalized support from designated clinical staff supervised by his physician, including individualized plan of care and coordination with other care providers 24/7 contact phone numbers for assistance for urgent and routine care needs. Service will only be billed when office clinical staff spend 20 minutes or more in a month to coordinate care. Only one practitioner may furnish and bill the service in a calendar month. The patient may stop CCM services at any time (effective at the end of the month) by phone call to the office staff. The patient will be responsible for cost sharing (co-pay) of up to 20% of the service fee (after annual deductible is met).  Patient agreed to services and verbal consent obtained.   Follow up plan: Telephone appointment with care management team member scheduled for:08/09/2020  [place] [facility]

## 2020-08-09 ENCOUNTER — Telehealth: Payer: PPO

## 2020-08-09 DIAGNOSIS — Z85828 Personal history of other malignant neoplasm of skin: Secondary | ICD-10-CM | POA: Diagnosis not present

## 2020-08-09 DIAGNOSIS — X32XXXD Exposure to sunlight, subsequent encounter: Secondary | ICD-10-CM | POA: Diagnosis not present

## 2020-08-09 DIAGNOSIS — L570 Actinic keratosis: Secondary | ICD-10-CM | POA: Diagnosis not present

## 2020-08-09 DIAGNOSIS — Z08 Encounter for follow-up examination after completed treatment for malignant neoplasm: Secondary | ICD-10-CM | POA: Diagnosis not present

## 2020-09-14 ENCOUNTER — Other Ambulatory Visit: Payer: Self-pay

## 2020-09-14 ENCOUNTER — Encounter: Payer: Self-pay | Admitting: Family Medicine

## 2020-09-14 ENCOUNTER — Ambulatory Visit (INDEPENDENT_AMBULATORY_CARE_PROVIDER_SITE_OTHER): Payer: PPO | Admitting: Family Medicine

## 2020-09-14 VITALS — BP 136/88 | HR 72 | Temp 98.1°F | Resp 14 | Ht 75.0 in | Wt 205.0 lb

## 2020-09-14 DIAGNOSIS — G119 Hereditary ataxia, unspecified: Secondary | ICD-10-CM | POA: Diagnosis not present

## 2020-09-14 DIAGNOSIS — I6930 Unspecified sequelae of cerebral infarction: Secondary | ICD-10-CM

## 2020-09-14 DIAGNOSIS — I672 Cerebral atherosclerosis: Secondary | ICD-10-CM | POA: Diagnosis not present

## 2020-09-14 DIAGNOSIS — I6381 Other cerebral infarction due to occlusion or stenosis of small artery: Secondary | ICD-10-CM

## 2020-09-14 NOTE — Progress Notes (Signed)
Subjective:    Patient ID: Matthew Luna, male    DOB: 03-Aug-1940, 80 y.o.   MRN: [id number]  10/24/19 Patient had an MRA of the brain along with an MRI of the brain performed in 05/2014.  The results are dictated below: IMPRESSION: MRI HEAD: No acute intracranial process.   Involutional changes. Mild to moderate white matter changes suggest chronic small vessel ischemic disease.   MRA HEAD: High-grade stenosis versus occlusion of proximal RIGHT P1 segment, mid to high-grade stenosis LEFT proximal P2 segment.   Atherosclerosis/stenosis of bilateral superior cerebellar arteries, possible occlusion on the LEFT.   Mild stenosis RIGHT supraclinoid internal carotid artery.  10/24/19  Patient is not sure when but at some point he stopped taking his aspirin and his Crestor.  He is here today because in the last week he has become progressively off balance.  He states that when he stands up or when he tries to walk he feels unsteady on his feet.  He also complains of some blurry vision primarily in his left eye.  He had an eye exam 6 months ago that was normal.  He denies any unilateral weakness, slurred speech, facial droop.  He denies any memory loss.  There is no weakness on his exam today and there is no hyperreflexia or spasticity.  He is able to perform Romberg testing.  He is able to perform finger-to-nose testing.  However heel-to-toe walk is grossly abnormal.  The patient cannot take 1 step heel-to-toe without falling to the side in a dramatic fashion.  He is unable to walk in a straight line.  He demonstrates ataxia unless he walks with a wide-based gait.  He denies any vertigo.  Dix-Hallpike maneuver is negative.  At that time, my plan was: Begin aspirin 81 mg daily immediately.  Resume Crestor 20 mg daily immediately.  Schedule an MRI of the brain along with an MRA of the brain and an MRA of the neck as soon as possible.  11/03/19   IMPRESSION: MRI brain:   1. Punctate focus of  apparent restricted diffusion within the left parietal lobe which may reflect an acute/early subacute infarct or susceptibility artifact arising from the adjacent calvarium. 2. Additional subcentimeter focus of apparent diffusion weighted hyperintensity within the left parietooccipital lobes which is favored to reflect susceptibility artifact arising from the calvarium. A tiny acute/early subacute infarct at this site cannot be excluded. 3. Moderate generalized cerebral atrophy and cerebral white matter chronic small vessel ischemic disease, progressed as compared to the MRI of 05/11/2014. 4. Small chronic infarcts within the right pons and right cerebellar hemisphere are new as compared to the MRI. 5. Unchanged chronic lacunar infarcts within the bilateral cerebral white matter, left thalamus and left cerebellar hemisphere. 6. Ethmoid sinus mucosal thickening. 7. Small bilateral mastoid effusions.   MRA head:   1. Intracranial atherosclerotic disease with multifocal stenoses, most notably as follows. 2. Atherosclerotic irregularity of the M2 and more distal MCA branches bilaterally, progressed as compared to the prior MRA of 05/11/2014. Most notably, high-grade stenoses are now present within multiple proximal left M2 MCA branches. 3. High-grade stenosis versus occlusion of the proximal P1 right PCA, unchanged. 4. Unchanged high-grade stenosis within the proximal P2 left PCA. 5. Severe atherosclerotic irregularity with high-grade stenoses within the proximal superior cerebellar arteries bilaterally, progressed on the right.   Patient is here today with his wife to discuss further.  Therefore there are at least 4 new strokes compared to his MRI from  2017.  There is one subacute infarct that appears to have happened recently.  There are 2 infarcts near the left parietal lobe and to near the right cerebellum and right pons.  There are also chronic infarcts seen in the left cerebellar  hemisphere as well as left lacunar infarct.  Patient has numerous high-grade stenoses within the branches of the left middle cerebral artery as well as within branches of the right posterior cerebral artery and the left posterior cerebral artery.  At that time, my plan was: I want to maximize medical therapy.  Discontinue aspirin.  Replace with Aggrenox 1 tablet twice daily.  Continue high-dose Crestor.  Blood pressure today is excellent at 124/80.  Consult neurology.  My expectation is that the patient is not a candidate for any intravascular intervention however given his numerous strokes, I would like a second opinion particular given the bilateral posterior cerebral artery stenoses.  Spent more than 30 minutes today with the patient and his wife reviewing the MRI and determining our plan of care.  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  Neurology had recommended being conservative in managing his blood pressure to ensure adequate posterior perfusion.  They were willing to accept a systolic blood pressure between 130 and 150.  I agree with this.  I am concerned that dropping his systolic blood pressure too much will cause cerebral hypoperfusion and lead to dizziness and ataxia.  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  His blood pressure here today is 142/92 and I verified this myself.  Patient is checking his blood pressure at home however he is inconsistent with regards to his self-reported blood pressure.  History is difficult to obtain at times and therefore I do not feel comfortable that he is truly checking his blood pressure every day and that it staying less than 150. At that time, my plan was:  I am willing to accept a systolic blood pressure less than 150.  However I do not want a systolic blood  pressure less than 130.  Therefore of asked the patient to check his blood pressure at home every day in the morning and in the afternoon and record these values and bring it to me on Thursday or Friday.  If his systolic blood pressure is greater than 150 consistently, I will add additional medication at that time.  However if less than 150 the majority the time I would not make additional reductions in his blood pressures due to the reasons outlined above.  09/14/20 I referred the patient to neurology and they recommended keeping his systolic blood pressure around 150 if possible due to the high-grade stenosis in his posterior cerebral arteries.  Patient states that he checks his blood pressure every morning and is typically 145-150 however he then takes his Bystolic.  He states that occasionally he then gets significant dizziness and disequilibrium and his balance is affected.  Has been bad the last few days.  He denies any vertigo.  He denies any syncope.  He denies any blurry vision or vision changes or chest pain or shortness of breath.  Past Medical History:  Diagnosis Date   AAA (abdominal aortic aneurysm) (HCC)    Actinic keratosis    Allergy    mild    Anxiety    CKD (chronic  kidney disease) stage 3, GFR 30-59 ml/min (HCC)    Diverticulosis of colon (without mention of hemorrhage)    Duodenitis without mention of hemorrhage    Esophageal reflux    Hypertrophy of prostate without urinary obstruction and other lower urinary tract symptoms (LUTS)    Left sided ulcerative (chronic) colitis (HCC)    Mononeuritis of unspecified site    Neuropathy    Nonspecific abnormal results of thyroid function study    Prostate cancer ([place])    cryotherapy 2011 ([facility])   Schatzki's ring    Sleep apnea    does not use the CPAP   Stricture and stenosis of esophagus    Stroke (HCC)    left parietal, left parietal/occipital, right cerebellar, right pons   TIA (transient ischemic attack) 2017   pt states  was told mini stroke   Unspecified essential hypertension    Past Surgical History:  Procedure Laterality Date   ABDOMINAL AORTIC ANEURYSM REPAIR     2018   BALLOON DILATION  03/22/2011   Procedure: BALLOON DILATION;  Surgeon: Inda Castle, MD;  Location: [facility];  Service: Endoscopy;  Laterality: N/A;  kelly/ebp   CHOLECYSTECTOMY     COLONOSCOPY     ENDOVASCULAR REPAIR/STENT GRAFT N/A 03/29/2016   Procedure: Endovascular Repair/Stent Graft;  Surgeon: Algernon Huxley, MD;  Location: [facility];  Service: Cardiovascular;  Laterality: N/A;   ESOPHAGOGASTRODUODENOSCOPY  03/22/2011   Procedure: ESOPHAGOGASTRODUODENOSCOPY (EGD);  Surgeon: Inda Castle, MD;  Location: [facility];  Service: Endoscopy;  Laterality: N/A;   PROSTATE SURGERY     UPPER GASTROINTESTINAL ENDOSCOPY     Current Outpatient Medications on File Prior to Visit  Medication Sig Dispense Refill   BYSTOLIC 10 MG tablet TAKE 1/2 TABLET (5 MG TOTAL) BY MOUTH AT BEDTIME. 45 tablet 3   citalopram (CELEXA) 10 MG tablet Take 10 mg by mouth daily.     doxycycline (VIBRA-TABS) 100 MG tablet Take 1 tablet (100 mg total) by mouth 2 (two) times daily. 14 tablet 0   gabapentin (NEURONTIN) 100 MG capsule Take 1 capsule (100 mg total) by mouth 3 (three) times daily. (Patient taking differently: Take 100 mg by mouth once. Takes one tab daily) 90 capsule 1   rosuvastatin (CRESTOR) 40 MG tablet Take 1 tablet (40 mg total) by mouth daily. 30 tablet 5   valsartan (DIOVAN) 160 MG tablet Take 1 tablet (160 mg total) by mouth daily. (Patient not taking: Reported on 05/20/2020) 30 tablet 5   No current facility-administered medications on file prior to visit.   Allergies  Allergen Reactions   Metronidazole Anaphylaxis   Amoxicillin-Pot Clavulanate Other (See Comments)    UNKNOWN   Ciprofloxacin Other (See Comments)    UNKNOWN   Clindamycin/Lincomycin Other (See Comments)    Nausea, stomach pain, sweats   Mesalamine      REACTION: Intolerance   Social History   Socioeconomic History   Marital status: Married    Spouse name: Not on file   Number of children: 1   Years of education: Some colg   Highest education level: Not on file  Occupational History   Occupation: Retired   Tobacco Use   Smoking status: Never   Smokeless tobacco: Never  Vaping Use   Vaping Use: Never used  Substance and Sexual Activity   Alcohol use: Yes    Alcohol/week: 14.0 standard drinks    Types: 14 Glasses of wine per week    Comment: per pt -  drinks 10 oz red wine every night!   Drug use: No   Sexual activity: Never  Other Topics Concern   Not on file  Social History Narrative   1 -2 cups of coffee a day, some tea consumption    Left handed   Lives with wife   Social Determinants of Health   Financial Resource Strain: Not on file  Food Insecurity: Not on file  Transportation Needs: Not on file  Physical Activity: Not on file  Stress: Not on file  Social Connections: Not on file  Intimate Partner Violence: Not on file      Review of Systems  All other systems reviewed and are negative.     Objective:   Physical Exam Vitals reviewed.  Constitutional:      General: He is not in acute distress.    Appearance: He is well-developed. He is not diaphoretic.  Neck:     Thyroid: No thyromegaly.     Vascular: No JVD.  Cardiovascular:     Rate and Rhythm: Normal rate and regular rhythm.     Heart sounds: Normal heart sounds. No murmur heard.   No friction rub. No gallop.  Pulmonary:     Effort: Pulmonary effort is normal. No respiratory distress.     Breath sounds: Normal breath sounds. No wheezing or rales.  Lymphadenopathy:     Cervical: No cervical adenopathy.  Neurological:     Mental Status: He is alert and oriented to person, place, and time.     Motor: Motor function is intact. No tremor, atrophy, abnormal muscle tone or pronator drift.     Coordination: Coordination is intact.           Assessment & Plan:  Intracranial atherosclerosis  Cerebrovascular accident (CVA) due to stenosis of small artery ([place])  Cerebellar ataxia ([place]) I explained to the patient his previous medical history.  I believe that when his blood pressure is lower, he has cerebral hypoperfusion due to the high-grade stenosis in the posterior cerebral artery.  I believe that this also likely causes his dizziness and ataxia.  Therefore, I believe we need to allow permissive hypertension.  I explained to the patient that I would like to see his systolic blood pressure remained in the 140s to 150s.  I believe that when his systolic blood pressure is less than 140 he suffers from cerebral hypoperfusion due to the high-grade stenosis and this causes dizziness.  Therefore of asked the patient to discontinue Bystolic altogether and to keep an eye on his blood pressure.  If his systolic blood pressure rises above 160, we may try low-dose medication such as losartan 25 mg a day given his chronic kidney disease.  However I anticipate that his dizziness will likely improve as his systolic blood pressure rises slightly

## 2020-09-30 DIAGNOSIS — L02821 Furuncle of head [any part, except face]: Secondary | ICD-10-CM | POA: Diagnosis not present

## 2020-09-30 DIAGNOSIS — B9689 Other specified bacterial agents as the cause of diseases classified elsewhere: Secondary | ICD-10-CM | POA: Diagnosis not present

## 2020-09-30 DIAGNOSIS — X32XXXD Exposure to sunlight, subsequent encounter: Secondary | ICD-10-CM | POA: Diagnosis not present

## 2020-09-30 DIAGNOSIS — L570 Actinic keratosis: Secondary | ICD-10-CM | POA: Diagnosis not present

## 2020-10-11 ENCOUNTER — Other Ambulatory Visit: Payer: Self-pay | Admitting: Neurology

## 2020-10-25 DIAGNOSIS — T148XXA Other injury of unspecified body region, initial encounter: Secondary | ICD-10-CM | POA: Diagnosis not present

## 2020-10-25 DIAGNOSIS — S90562A Insect bite (nonvenomous), left ankle, initial encounter: Secondary | ICD-10-CM | POA: Diagnosis not present

## 2020-10-25 DIAGNOSIS — C4441 Basal cell carcinoma of skin of scalp and neck: Secondary | ICD-10-CM | POA: Diagnosis not present

## 2020-10-25 DIAGNOSIS — S90561A Insect bite (nonvenomous), right ankle, initial encounter: Secondary | ICD-10-CM | POA: Diagnosis not present

## 2020-11-02 ENCOUNTER — Other Ambulatory Visit: Payer: Self-pay

## 2020-11-02 ENCOUNTER — Ambulatory Visit (INDEPENDENT_AMBULATORY_CARE_PROVIDER_SITE_OTHER): Payer: PPO | Admitting: Family Medicine

## 2020-11-02 ENCOUNTER — Encounter: Payer: Self-pay | Admitting: Family Medicine

## 2020-11-02 VITALS — BP 140/86 | HR 82 | Temp 98.7°F | Resp 16 | Ht 75.0 in | Wt 201.0 lb

## 2020-11-02 DIAGNOSIS — I672 Cerebral atherosclerosis: Secondary | ICD-10-CM | POA: Diagnosis not present

## 2020-11-02 DIAGNOSIS — G119 Hereditary ataxia, unspecified: Secondary | ICD-10-CM

## 2020-11-02 DIAGNOSIS — I6930 Unspecified sequelae of cerebral infarction: Secondary | ICD-10-CM

## 2020-11-02 DIAGNOSIS — I6381 Other cerebral infarction due to occlusion or stenosis of small artery: Secondary | ICD-10-CM

## 2020-11-02 MED ORDER — ROSUVASTATIN CALCIUM 40 MG PO TABS: 40.0000 mg | ORAL_TABLET | Freq: Every day | ORAL | 3 refills | Status: DC

## 2020-11-02 NOTE — Progress Notes (Signed)
Subjective:    Patient ID: ABBY STINES, male    DOB: [birth year], 80 y.o.   MRN: [id number]  10/24/19 Patient had an MRA of the brain along with an MRI of the brain performed in 05/2014.  The results are dictated below: IMPRESSION: MRI HEAD: No acute intracranial process.   Involutional changes. Mild to moderate white matter changes suggest chronic small vessel ischemic disease.   MRA HEAD: High-grade stenosis versus occlusion of proximal RIGHT P1 segment, mid to high-grade stenosis LEFT proximal P2 segment.   Atherosclerosis/stenosis of bilateral superior cerebellar arteries, possible occlusion on the LEFT.   Mild stenosis RIGHT supraclinoid internal carotid artery.  10/24/19  Patient is not sure when but at some point he stopped taking his aspirin and his Crestor.  He is here today because in the last week he has become progressively off balance.  He states that when he stands up or when he tries to walk he feels unsteady on his feet.  He also complains of some blurry vision primarily in his left eye.  He had an eye exam 6 months ago that was normal.  He denies any unilateral weakness, slurred speech, facial droop.  He denies any memory loss.  There is no weakness on his exam today and there is no hyperreflexia or spasticity.  He is able to perform Romberg testing.  He is able to perform finger-to-nose testing.  However heel-to-toe walk is grossly abnormal.  The patient cannot take 1 step heel-to-toe without falling to the side in a dramatic fashion.  He is unable to walk in a straight line.  He demonstrates ataxia unless he walks with a wide-based gait.  He denies any vertigo.  Dix-Hallpike maneuver is negative.  At that time, my plan was: Begin aspirin 81 mg daily immediately.  Resume Crestor 20 mg daily immediately.  Schedule an MRI of the brain along with an MRA of the brain and an MRA of the neck as soon as possible.  11/03/19   IMPRESSION: MRI brain:   1. Punctate focus of  apparent restricted diffusion within the left parietal lobe which may reflect an acute/early subacute infarct or susceptibility artifact arising from the adjacent calvarium. 2. Additional subcentimeter focus of apparent diffusion weighted hyperintensity within the left parietooccipital lobes which is favored to reflect susceptibility artifact arising from the calvarium. A tiny acute/early subacute infarct at this site cannot be excluded. 3. Moderate generalized cerebral atrophy and cerebral white matter chronic small vessel ischemic disease, progressed as compared to the MRI of 05/11/2014. 4. Small chronic infarcts within the right pons and right cerebellar hemisphere are new as compared to the MRI. 5. Unchanged chronic lacunar infarcts within the bilateral cerebral white matter, left thalamus and left cerebellar hemisphere. 6. Ethmoid sinus mucosal thickening. 7. Small bilateral mastoid effusions.   MRA head:   1. Intracranial atherosclerotic disease with multifocal stenoses, most notably as follows. 2. Atherosclerotic irregularity of the M2 and more distal MCA branches bilaterally, progressed as compared to the prior MRA of 05/11/2014. Most notably, high-grade stenoses are now present within multiple proximal left M2 MCA branches. 3. High-grade stenosis versus occlusion of the proximal P1 right PCA, unchanged. 4. Unchanged high-grade stenosis within the proximal P2 left PCA. 5. Severe atherosclerotic irregularity with high-grade stenoses within the proximal superior cerebellar arteries bilaterally, progressed on the right.   Patient is here today with his wife to discuss further.  Therefore there are at least 4 new strokes compared to his MRI from  2017.  There is one subacute infarct that appears to have happened recently.  There are 2 infarcts near the left parietal lobe and to near the right cerebellum and right pons.  There are also chronic infarcts seen in the left cerebellar  hemisphere as well as left lacunar infarct.  Patient has numerous high-grade stenoses within the branches of the left middle cerebral artery as well as within branches of the right posterior cerebral artery and the left posterior cerebral artery.  At that time, my plan was: I want to maximize medical therapy.  Discontinue aspirin.  Replace with Aggrenox 1 tablet twice daily.  Continue high-dose Crestor.  Blood pressure today is excellent at 124/80.  Consult neurology.  My expectation is that the patient is not a candidate for any intravascular intervention however given his numerous strokes, I would like a second opinion particular given the bilateral posterior cerebral artery stenoses.  Spent more than 30 minutes today with the patient and his wife reviewing the MRI and determining our plan of care.  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  Neurology had recommended being conservative in managing his blood pressure to ensure adequate posterior perfusion.  They were willing to accept a systolic blood pressure between 130 and 150.  I agree with this.  I am concerned that dropping his systolic blood pressure too much will cause cerebral hypoperfusion and lead to dizziness and ataxia.  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  His blood pressure here today is 142/92 and I verified this myself.  Patient is checking his blood pressure at home however he is inconsistent with regards to his self-reported blood pressure.  History is difficult to obtain at times and therefore I do not feel comfortable that he is truly checking his blood pressure every day and that it staying less than 150. At that time, my plan was:  I am willing to accept a systolic blood pressure less than 150.  However I do not want a systolic blood  pressure less than 130.  Therefore of asked the patient to check his blood pressure at home every day in the morning and in the afternoon and record these values and bring it to me on Thursday or Friday.  If his systolic blood pressure is greater than 150 consistently, I will add additional medication at that time.  However if less than 150 the majority the time I would not make additional reductions in his blood pressures due to the reasons outlined above.  09/14/20 I referred the patient to neurology and they recommended keeping his systolic blood pressure around 150 if possible due to the high-grade stenosis in his posterior cerebral arteries.  Patient states that he checks his blood pressure every morning and is typically 145-150 however he then takes his Bystolic.  He states that occasionally he then gets significant dizziness and disequilibrium and his balance is affected.  Has been bad the last few days.  He denies any vertigo.  He denies any syncope.  He denies any blurry vision or vision changes or chest pain or shortness of breath.  At that time, my plan was:  I explained to the patient his previous medical history.  I believe that when his blood pressure is lower, he has cerebral hypoperfusion due to the high-grade stenosis  in the posterior cerebral artery.  I believe that this also likely causes his dizziness and ataxia.  Therefore, I believe we need to allow permissive hypertension.  I explained to the patient that I would like to see his systolic blood pressure remained in the 140s to 150s.  I believe that when his systolic blood pressure is less than 140 he suffers from cerebral hypoperfusion due to the high-grade stenosis and this causes dizziness.  Therefore of asked the patient to discontinue Bystolic altogether and to keep an eye on his blood pressure.  If his systolic blood pressure rises above 160, we may try low-dose medication such as losartan 25 mg a day given his chronic kidney disease.   However I anticipate that his dizziness will likely improve as his systolic blood pressure rises slightly  11/02/20 Patient seems very confused today.  I am not sure of what he is taking.  He states that he is only taking 1 medicine.  Then at other times he reports that he is on citalopram gabapentin aspirin clindamycin.  He does not have any of his medicines with him today.  He is not sure if he is taking rosuvastatin.  He is convinced that he discontinued Bystolic last time.  He continues to report occasional dizziness.  Again he is uncertain if he is taking gabapentin for neuropathy.  Past Medical History:  Diagnosis Date   AAA (abdominal aortic aneurysm)    Actinic keratosis    Allergy    mild    Anxiety    CKD (chronic kidney disease) stage 3, GFR 30-59 ml/min (HCC)    Diverticulosis of colon (without mention of hemorrhage)    Duodenitis without mention of hemorrhage    Esophageal reflux    Hypertrophy of prostate without urinary obstruction and other lower urinary tract symptoms (LUTS)    Left sided ulcerative (chronic) colitis (HCC)    Mononeuritis of unspecified site    Neuropathy    Nonspecific abnormal results of thyroid function study    Prostate cancer ([place])    cryotherapy 2011 ([facility])   Schatzki's ring    Sleep apnea    does not use the CPAP   Stricture and stenosis of esophagus    Stroke (HCC)    left parietal, left parietal/occipital, right cerebellar, right pons   TIA (transient ischemic attack) 2017   pt states was told mini stroke   Unspecified essential hypertension    Past Surgical History:  Procedure Laterality Date   ABDOMINAL AORTIC ANEURYSM REPAIR     2018   BALLOON DILATION  03/22/2011   Procedure: BALLOON DILATION;  Surgeon: Inda Castle, MD;  Location: [facility];  Service: Endoscopy;  Laterality: N/A;  kelly/ebp   CHOLECYSTECTOMY     COLONOSCOPY     ENDOVASCULAR REPAIR/STENT GRAFT N/A 03/29/2016   Procedure: Endovascular Repair/Stent Graft;   Surgeon: Algernon Huxley, MD;  Location: [facility];  Service: Cardiovascular;  Laterality: N/A;   ESOPHAGOGASTRODUODENOSCOPY  03/22/2011   Procedure: ESOPHAGOGASTRODUODENOSCOPY (EGD);  Surgeon: Inda Castle, MD;  Location: [facility];  Service: Endoscopy;  Laterality: N/A;   PROSTATE SURGERY     UPPER GASTROINTESTINAL ENDOSCOPY     Current Outpatient Medications on File Prior to Visit  Medication Sig Dispense Refill   BYSTOLIC 10 MG tablet TAKE 1/2 TABLET (5 MG TOTAL) BY MOUTH AT BEDTIME. 45 tablet 3   citalopram (CELEXA) 10 MG tablet Take 10 mg by mouth daily.     gabapentin (NEURONTIN)  100 MG capsule Take 1 capsule (100 mg total) by mouth 3 (three) times daily. 90 capsule 1   rosuvastatin (CRESTOR) 40 MG tablet TAKE 1 TABLET BY MOUTH EVERY DAY 30 tablet 1   No current facility-administered medications on file prior to visit.   Allergies  Allergen Reactions   Metronidazole Anaphylaxis   Amoxicillin-Pot Clavulanate Other (See Comments)    UNKNOWN   Ciprofloxacin Other (See Comments)    UNKNOWN   Clindamycin/Lincomycin Other (See Comments)    Nausea, stomach pain, sweats   Mesalamine     REACTION: Intolerance   Social History   Socioeconomic History   Marital status: Married    Spouse name: Not on file   Number of children: 1   Years of education: Some colg   Highest education level: Not on file  Occupational History   Occupation: Retired   Tobacco Use   Smoking status: Never   Smokeless tobacco: Never  Vaping Use   Vaping Use: Never used  Substance and Sexual Activity   Alcohol use: Yes    Alcohol/week: 14.0 standard drinks    Types: 14 Glasses of wine per week    Comment: per pt -drinks 10 oz red wine every night!   Drug use: No   Sexual activity: Never  Other Topics Concern   Not on file  Social History Narrative   1 -2 cups of coffee a day, some tea consumption    Left handed   Lives with wife   Social Determinants of Health   Financial Resource  Strain: Not on file  Food Insecurity: Not on file  Transportation Needs: Not on file  Physical Activity: Not on file  Stress: Not on file  Social Connections: Not on file  Intimate Partner Violence: Not on file      Review of Systems  All other systems reviewed and are negative.     Objective:   Physical Exam Vitals reviewed.  Constitutional:      General: He is not in acute distress.    Appearance: He is well-developed. He is not diaphoretic.  Neck:     Thyroid: No thyromegaly.     Vascular: No JVD.  Cardiovascular:     Rate and Rhythm: Normal rate and regular rhythm.     Heart sounds: Normal heart sounds. No murmur heard.   No friction rub. No gallop.  Pulmonary:     Effort: Pulmonary effort is normal. No respiratory distress.     Breath sounds: Normal breath sounds. No wheezing or rales.  Lymphadenopathy:     Cervical: No cervical adenopathy.  Neurological:     Mental Status: He is alert and oriented to person, place, and time.     Motor: Motor function is intact. No tremor, atrophy, abnormal muscle tone or pronator drift.     Coordination: Coordination is intact.          Assessment & Plan:  Cerebrovascular accident (CVA) due to stenosis of small artery ([place]) - Plan: CBC with Differential/Platelet, COMPLETE METABOLIC PANEL WITH GFR, Lipid panel  Intracranial atherosclerosis - Plan: CBC with Differential/Platelet, COMPLETE METABOLIC PANEL WITH GFR, Lipid panel  Cerebellar ataxia ([place]) Patient does not seem to recollect our previous discussion.  I explained to him that his dizziness is due to cerebral hypoperfusion in the posterior cerebral artery.  He also has several strokes present on his previous MRI some of which include the cerebellum.  Therefore our focus needs to be on preventing further strokes.  Neurology  has recommended permissive hypertension with blood pressure between 130 and 150/70-90.  He is certainly within that range today.  I emphasized that the  patient needs to take aspirin 81 mg daily and Crestor 40 mg daily.  I recommended that he discontinue all other medication as I am concerned that gabapentin could exacerbate dizziness.  I want him to throw away all old pill bottles at home to avoid confusion because this is where he got the clindamycin from.  Check CBC CMP and lipid panel

## 2020-11-03 LAB — CBC WITH DIFFERENTIAL/PLATELET
Absolute Monocytes: 524 cells/uL (ref 200–950)
Basophils Absolute: 106 cells/uL (ref 0–200)
Basophils Relative: 1.4 %
Eosinophils Absolute: 1094 cells/uL — ABNORMAL HIGH (ref 15–500)
Eosinophils Relative: 14.4 %
HCT: 44 % (ref 38.5–50.0)
Hemoglobin: 15 g/dL (ref 13.2–17.1)
Lymphs Abs: 2250 cells/uL (ref 850–3900)
MCH: 33.2 pg — ABNORMAL HIGH (ref 27.0–33.0)
MCHC: 34.1 g/dL (ref 32.0–36.0)
MCV: 97.3 fL (ref 80.0–100.0)
MPV: 10 fL (ref 7.5–12.5)
Monocytes Relative: 6.9 %
Neutro Abs: 3625 cells/uL (ref 1500–7800)
Neutrophils Relative %: 47.7 %
Platelets: 230 10*3/uL (ref 140–400)
RBC: 4.52 10*6/uL (ref 4.20–5.80)
RDW: 13 % (ref 11.0–15.0)
Total Lymphocyte: 29.6 %
WBC: 7.6 10*3/uL (ref 3.8–10.8)

## 2020-11-03 LAB — LIPID PANEL
Cholesterol: 151 mg/dL (ref <200)
HDL: 45 mg/dL (ref >=40)
LDL Cholesterol (Calc): 85 mg/dL (calc)
Non-HDL Cholesterol (Calc): 106 mg/dL (calc) (ref <130)
Total CHOL/HDL Ratio: 3.4 (calc) (ref <5.0)
Triglycerides: 118 mg/dL (ref <150)

## 2020-11-03 LAB — COMPLETE METABOLIC PANEL WITH GFR
AG Ratio: 1.5 (calc) (ref 1.0–2.5)
ALT: 7 U/L — ABNORMAL LOW (ref 9–46)
AST: 15 U/L (ref 10–35)
Albumin: 4 g/dL (ref 3.6–5.1)
Alkaline phosphatase (APISO): 68 U/L (ref 35–144)
BUN/Creatinine Ratio: 15 (calc) (ref 6–22)
BUN: 28 mg/dL — ABNORMAL HIGH (ref 7–25)
CO2: 27 mmol/L (ref 20–32)
Calcium: 9.6 mg/dL (ref 8.6–10.3)
Chloride: 107 mmol/L (ref 98–110)
Creat: 1.82 mg/dL — ABNORMAL HIGH (ref 0.70–1.28)
Globulin: 2.7 g/dL (calc) (ref 1.9–3.7)
Glucose, Bld: 69 mg/dL (ref 65–99)
Potassium: 4.8 mmol/L (ref 3.5–5.3)
Sodium: 143 mmol/L (ref 135–146)
Total Bilirubin: 0.6 mg/dL (ref 0.2–1.2)
Total Protein: 6.7 g/dL (ref 6.1–8.1)
eGFR: 37 mL/min/{1.73_m2} — ABNORMAL LOW (ref >=60)

## 2020-11-15 ENCOUNTER — Emergency Department: Payer: PPO

## 2020-11-15 ENCOUNTER — Observation Stay
Admission: EM | Admit: 2020-11-15 | Discharge: 2020-11-17 | Disposition: A | Discharge status: Home / Self Care | Payer: PPO | Attending: Internal Medicine | Admitting: Internal Medicine

## 2020-11-15 ENCOUNTER — Other Ambulatory Visit: Payer: Self-pay

## 2020-11-15 DIAGNOSIS — I10 Essential (primary) hypertension: Secondary | ICD-10-CM | POA: Diagnosis not present

## 2020-11-15 DIAGNOSIS — I672 Cerebral atherosclerosis: Secondary | ICD-10-CM | POA: Diagnosis present

## 2020-11-15 DIAGNOSIS — I6782 Cerebral ischemia: Secondary | ICD-10-CM | POA: Diagnosis not present

## 2020-11-15 DIAGNOSIS — R42 Dizziness and giddiness: Secondary | ICD-10-CM | POA: Diagnosis not present

## 2020-11-15 DIAGNOSIS — C61 Malignant neoplasm of prostate: Secondary | ICD-10-CM | POA: Diagnosis not present

## 2020-11-15 DIAGNOSIS — G4489 Other headache syndrome: Secondary | ICD-10-CM | POA: Diagnosis not present

## 2020-11-15 DIAGNOSIS — I160 Hypertensive urgency: Principal | ICD-10-CM | POA: Diagnosis present

## 2020-11-15 DIAGNOSIS — Z8546 Personal history of malignant neoplasm of prostate: Secondary | ICD-10-CM | POA: Insufficient documentation

## 2020-11-15 DIAGNOSIS — Z20822 Contact with and (suspected) exposure to covid-19: Secondary | ICD-10-CM | POA: Insufficient documentation

## 2020-11-15 DIAGNOSIS — R519 Headache, unspecified: Secondary | ICD-10-CM | POA: Diagnosis present

## 2020-11-15 DIAGNOSIS — I6502 Occlusion and stenosis of left vertebral artery: Secondary | ICD-10-CM | POA: Diagnosis not present

## 2020-11-15 DIAGNOSIS — Z79899 Other long term (current) drug therapy: Secondary | ICD-10-CM | POA: Insufficient documentation

## 2020-11-15 DIAGNOSIS — R0902 Hypoxemia: Secondary | ICD-10-CM | POA: Diagnosis not present

## 2020-11-15 DIAGNOSIS — I130 Hypertensive heart and chronic kidney disease with heart failure and stage 1 through stage 4 chronic kidney disease, or unspecified chronic kidney disease: Secondary | ICD-10-CM | POA: Diagnosis not present

## 2020-11-15 DIAGNOSIS — N183 Chronic kidney disease, stage 3 unspecified: Secondary | ICD-10-CM | POA: Diagnosis not present

## 2020-11-15 DIAGNOSIS — I129 Hypertensive chronic kidney disease with stage 1 through stage 4 chronic kidney disease, or unspecified chronic kidney disease: Secondary | ICD-10-CM | POA: Insufficient documentation

## 2020-11-15 DIAGNOSIS — G319 Degenerative disease of nervous system, unspecified: Secondary | ICD-10-CM | POA: Diagnosis not present

## 2020-11-15 DIAGNOSIS — Z7982 Long term (current) use of aspirin: Secondary | ICD-10-CM | POA: Insufficient documentation

## 2020-11-15 LAB — PROTIME-INR
INR: 1 (ref 0.8–1.2)
Prothrombin Time: 13.3 seconds (ref 11.4–15.2)

## 2020-11-15 LAB — COMPREHENSIVE METABOLIC PANEL
ALT: 16 U/L (ref 0–44)
AST: 27 U/L (ref 15–41)
Albumin: 4.3 g/dL (ref 3.5–5.0)
Alkaline Phosphatase: 68 U/L (ref 38–126)
Anion gap: 10 (ref 5–15)
BUN: 25 mg/dL — ABNORMAL HIGH (ref 8–23)
CO2: 27 mmol/L (ref 22–32)
Calcium: 9.5 mg/dL (ref 8.9–10.3)
Chloride: 104 mmol/L (ref 98–111)
Creatinine, Ser: 1.84 mg/dL — ABNORMAL HIGH (ref 0.61–1.24)
GFR, Estimated: 37 mL/min — ABNORMAL LOW (ref >60)
Glucose, Bld: 93 mg/dL (ref 70–99)
Potassium: 3.9 mmol/L (ref 3.5–5.1)
Sodium: 141 mmol/L (ref 135–145)
Total Bilirubin: 0.9 mg/dL (ref 0.3–1.2)
Total Protein: 7.5 g/dL (ref 6.5–8.1)

## 2020-11-15 LAB — CBC
HCT: 44.3 % (ref 39.0–52.0)
Hemoglobin: 15.4 g/dL (ref 13.0–17.0)
MCH: 33.6 pg (ref 26.0–34.0)
MCHC: 34.8 g/dL (ref 30.0–36.0)
MCV: 96.5 fL (ref 80.0–100.0)
Platelets: 194 10*3/uL (ref 150–400)
RBC: 4.59 MIL/uL (ref 4.22–5.81)
RDW: 13.4 % (ref 11.5–15.5)
WBC: 8.6 10*3/uL (ref 4.0–10.5)
nRBC: 0 % (ref 0.0–0.2)

## 2020-11-15 LAB — DIFFERENTIAL
Abs Immature Granulocytes: 0.03 10*3/uL (ref 0.00–0.07)
Basophils Absolute: 0.1 10*3/uL (ref 0.0–0.1)
Basophils Relative: 1 %
Eosinophils Absolute: 0.7 10*3/uL — ABNORMAL HIGH (ref 0.0–0.5)
Eosinophils Relative: 8 %
Immature Granulocytes: 0 %
Lymphocytes Relative: 21 %
Lymphs Abs: 1.8 10*3/uL (ref 0.7–4.0)
Monocytes Absolute: 0.5 10*3/uL (ref 0.1–1.0)
Monocytes Relative: 5 %
Neutro Abs: 5.6 10*3/uL (ref 1.7–7.7)
Neutrophils Relative %: 65 %

## 2020-11-15 LAB — SEDIMENTATION RATE: Sed Rate: 5 mm/hr (ref 0–20)

## 2020-11-15 LAB — RESP PANEL BY RT-PCR (FLU A&B, COVID) ARPGX2
Influenza A by PCR: NEGATIVE
Influenza B by PCR: NEGATIVE
SARS Coronavirus 2 by RT PCR: NEGATIVE

## 2020-11-15 LAB — APTT: aPTT: 25 seconds (ref 24–36)

## 2020-11-15 MED ORDER — HYDRALAZINE HCL 20 MG/ML IJ SOLN: 10.0000 mg | Freq: Four times a day (QID) | INTRAMUSCULAR | Status: DC | PRN

## 2020-11-15 MED ORDER — ENOXAPARIN SODIUM 40 MG/0.4ML IJ SOSY
40.0000 mg | PREFILLED_SYRINGE | INTRAMUSCULAR | Status: DC
  Administered 2020-11-15 – 2020-11-16 (×2): 40 mg via SUBCUTANEOUS
  Filled 2020-11-15 (×2): qty 0.4

## 2020-11-15 MED ORDER — SODIUM CHLORIDE 0.9% FLUSH: 3.0000 mL | Freq: Once | INTRAVENOUS | Status: DC

## 2020-11-15 MED ORDER — ACETAMINOPHEN 325 MG PO TABS: 650.0000 mg | ORAL_TABLET | Freq: Four times a day (QID) | ORAL | Status: DC | PRN

## 2020-11-15 MED ORDER — ONDANSETRON HCL 4 MG/2ML IJ SOLN: 4.0000 mg | Freq: Four times a day (QID) | INTRAMUSCULAR | Status: DC | PRN

## 2020-11-15 MED ORDER — DIPHENHYDRAMINE HCL 50 MG/ML IJ SOLN
25.0000 mg | Freq: Four times a day (QID) | INTRAMUSCULAR | Status: AC
  Administered 2020-11-15 – 2020-11-16 (×3): 25 mg via INTRAVENOUS
  Filled 2020-11-15 (×3): qty 1

## 2020-11-15 MED ORDER — MORPHINE SULFATE (PF) 4 MG/ML IV SOLN
4.0000 mg | Freq: Once | INTRAVENOUS | Status: AC
  Administered 2020-11-15: 4 mg via INTRAVENOUS
  Filled 2020-11-15: qty 1

## 2020-11-15 MED ORDER — HYDROMORPHONE HCL 1 MG/ML IJ SOLN
0.5000 mg | Freq: Once | INTRAMUSCULAR | Status: AC
  Administered 2020-11-15: 0.5 mg via INTRAVENOUS
  Filled 2020-11-15: qty 1

## 2020-11-15 MED ORDER — BUTALBITAL-APAP-CAFFEINE 50-325-40 MG PO TABS
2.0000 | ORAL_TABLET | Freq: Once | ORAL | Status: AC
  Administered 2020-11-15: 2 via ORAL
  Filled 2020-11-15: qty 2

## 2020-11-15 MED ORDER — ACETAMINOPHEN 650 MG RE SUPP
650.0000 mg | Freq: Four times a day (QID) | RECTAL | Status: DC | PRN
  Filled 2020-11-15: qty 1

## 2020-11-15 MED ORDER — ONDANSETRON HCL 4 MG PO TABS: 4.0000 mg | ORAL_TABLET | Freq: Four times a day (QID) | ORAL | Status: DC | PRN

## 2020-11-15 MED ORDER — ACETAMINOPHEN 500 MG PO TABS
1000.0000 mg | ORAL_TABLET | Freq: Four times a day (QID) | ORAL | Status: AC
  Administered 2020-11-15 – 2020-11-16 (×3): 1000 mg via ORAL
  Filled 2020-11-15 (×3): qty 2

## 2020-11-15 MED ORDER — MELATONIN 5 MG PO TABS
2.5000 mg | ORAL_TABLET | Freq: Every day | ORAL | Status: DC
  Administered 2020-11-16: 2.5 mg via ORAL
  Filled 2020-11-15: qty 1

## 2020-11-15 MED ORDER — BUTALBITAL-APAP-CAFFEINE 50-325-40 MG PO TABS: 1.0000 | ORAL_TABLET | Freq: Four times a day (QID) | ORAL | Status: DC | PRN

## 2020-11-15 MED ORDER — ASPIRIN EC 81 MG PO TBEC
81.0000 mg | DELAYED_RELEASE_TABLET | Freq: Every day | ORAL | Status: DC
  Administered 2020-11-15 – 2020-11-17 (×3): 81 mg via ORAL
  Filled 2020-11-15 (×3): qty 1

## 2020-11-15 MED ORDER — ROSUVASTATIN CALCIUM 10 MG PO TABS
40.0000 mg | ORAL_TABLET | Freq: Every day | ORAL | Status: DC
  Administered 2020-11-15 – 2020-11-17 (×3): 40 mg via ORAL
  Filled 2020-11-15 (×2): qty 2
  Filled 2020-11-15: qty 4
  Filled 2020-11-15: qty 2

## 2020-11-15 MED ORDER — HYDRALAZINE HCL 20 MG/ML IJ SOLN
10.0000 mg | Freq: Once | INTRAMUSCULAR | Status: AC
  Administered 2020-11-15: 10 mg via INTRAVENOUS
  Filled 2020-11-15: qty 1

## 2020-11-15 MED ORDER — AMLODIPINE BESYLATE 5 MG PO TABS
5.0000 mg | ORAL_TABLET | Freq: Every day | ORAL | Status: DC
  Administered 2020-11-15 – 2020-11-16 (×2): 5 mg via ORAL
  Filled 2020-11-15 (×2): qty 1

## 2020-11-15 MED ORDER — CLONIDINE HCL 0.1 MG PO TABS
0.1000 mg | ORAL_TABLET | Freq: Once | ORAL | Status: AC
  Administered 2020-11-15: 0.1 mg via ORAL
  Filled 2020-11-15: qty 1

## 2020-11-15 MED ORDER — PROCHLORPERAZINE EDISYLATE 10 MG/2ML IJ SOLN
10.0000 mg | Freq: Four times a day (QID) | INTRAMUSCULAR | Status: AC
  Administered 2020-11-15 – 2020-11-16 (×3): 10 mg via INTRAVENOUS
  Filled 2020-11-15 (×3): qty 2

## 2020-11-15 NOTE — ED Notes (Signed)
Pt ambulated to toilet, stated a little dizzy and lightheaded but that this is normal for him.

## 2020-11-15 NOTE — Plan of Care (Signed)
Curbside recommendations requested by Dr. Francine Graven for this patient with PMHx significant for HTN, HLD, CKD, OSA non complaint with CPAP, severe intracranial atherosclerosis particularly of the posterior circulation, and other chronic medical issues as listed below.   Per brief review of the chart and history provided by Dr. Francine Graven, acute on chronic headache, with some recent medication changes. Goal BP is 130-150 / 70-90. August 16th visit he was symptomatic with dizziness after taking morning Bystolic. BP at appointment was 136/88, with plan to stop Bystolic. Subsequently, on 10/04 his PCP noted "Patient does not seem to recollect our previous discussion.  I explained to him that his dizziness is due to cerebral hypoperfusion in the posterior cerebral artery.  He also has several strokes present on his previous MRI some of which include the cerebellum.  Therefore our focus needs to be on preventing further strokes.  Neurology has recommended permissive hypertension with blood pressure between 130 and 150/70-90.  He is certainly within that range today.  I emphasized that the patient needs to take aspirin 81 mg daily and Crestor 40 mg daily.  I recommended that he discontinue all other medication as I am concerned that gabapentin could exacerbate dizziness.  I want him to throw away all old pill bottles at home to avoid confusion because this is where he got the clindamycin from.  Check CBC CMP and lipid panel"  He presents to ED today with worsening HA x 2 days and BPs in the 200s/120s, has not tried any specific treatment at home. Has received fiorecet 2 tabs, clonidine 0.1 mg, hydralazine 10 mg, dilaudid 0.5 mg, morphine 4 mg here without significant improvement. Afebrile without any focal neurological deficits per primary team  Past Medical History:  Diagnosis Date   AAA (abdominal aortic aneurysm)    Actinic keratosis    Allergy    mild    Anxiety    CKD (chronic kidney disease) stage 3, GFR 30-59  ml/min (HCC)    Diverticulosis of colon (without mention of hemorrhage)    Duodenitis without mention of hemorrhage    Esophageal reflux    Hypertrophy of prostate without urinary obstruction and other lower urinary tract symptoms (LUTS)    Left sided ulcerative (chronic) colitis (HCC)    Mononeuritis of unspecified site    Neuropathy    Nonspecific abnormal results of thyroid function study    Prostate cancer ([place])    cryotherapy 2011 ([facility])   Schatzki's ring    Sleep apnea    does not use the CPAP   Stricture and stenosis of esophagus    Stroke (HCC)    left parietal, left parietal/occipital, right cerebellar, right pons   TIA (transient ischemic attack) 2017   pt states was told mini stroke   Unspecified essential hypertension      Past Surgical History:  Procedure Laterality Date   ABDOMINAL AORTIC ANEURYSM REPAIR     2018   BALLOON DILATION  03/22/2011   Procedure: BALLOON DILATION;  Surgeon: Inda Castle, MD;  Location: [facility];  Service: Endoscopy;  Laterality: N/A;  kelly/ebp   CHOLECYSTECTOMY     COLONOSCOPY     ENDOVASCULAR REPAIR/STENT GRAFT N/A 03/29/2016   Procedure: Endovascular Repair/Stent Graft;  Surgeon: Algernon Huxley, MD;  Location: [facility];  Service: Cardiovascular;  Laterality: N/A;   ESOPHAGOGASTRODUODENOSCOPY  03/22/2011   Procedure: ESOPHAGOGASTRODUODENOSCOPY (EGD);  Surgeon: Inda Castle, MD;  Location: [facility];  Service: Endoscopy;  Laterality: N/A;   PROSTATE  SURGERY     UPPER GASTROINTESTINAL ENDOSCOPY     Basic Metabolic Panel: Recent Labs  Lab 11/15/20 0944  NA 141  K 3.9  CL 104  CO2 27  GLUCOSE 93  BUN 25*  CREATININE 1.84*  CALCIUM 9.5   Creatinine is at his recent baseline  CBC: Recent Labs  Lab 11/15/20 0944  WBC 8.6  NEUTROABS 5.6  HGB 15.4  HCT 44.3  MCV 96.5  PLT 194    Coagulation Studies: Recent Labs    11/15/20 0944  LABPROT 13.3  INR 1.0    MRI brain, MRA head and neck (11/15/2020)  and CTA 05/19/20 personally reviewed, agree with radiology:  No acute brain finding. Atrophy and chronic small-vessel ischemic changes as outlined above.   No significant anterior circulation pathology.   Severely diseased posterior cerebral arteries as described above. The appearance is unchanged compared to CT angiography of 05/19/2020.   Recommend:  1) Treat as hypertensive emergency per standard protocol with (mean arterial pressure should be reduced by approximately 10 to 20 percent in the first hour and then gradually during the next 23 hours so that the final pressure is reduced by approximately 25 percent compared with baseline); target SBP 150-60/ 85-95 2) Additionally would avoid further fiorecet given his age and t/12 of butalbital, schedule migraine cocktails q6hr tylenol, compazine and bendaryl  3) Encourage CPAP use as this can contribute to headaches significantly  4) If headache is refractory to these measures, will plan on full consult in the morning when neurology is in house.   Lesleigh Noe MD-PhD [facility] [phone]  [facility] coverage for [facility] is from 8 AM to 4 AM in-house and 4 PM to 8 PM by telephone/video. 8 PM to 8 AM emergent questions or overnight urgent questions should be addressed to Teleneurology On-call or [facility] neurohospitalist; contact information can be found on AMION

## 2020-11-15 NOTE — H&P (Addendum)
History and Physical    Matthew Luna:[id number] DOB: 07/03/1940 DOA: 11/15/2020  PCP: Susy Frizzle, MD   Patient coming from: Home  I have personally briefly reviewed patient's old medical records in [place]  Chief Complaint: Headache  HPI: Matthew Luna is a 80 y.o. male with medical history significant for hypertension, dyslipidemia, history of CVA, intracranial atherosclerotic disease who presents to the ER by private vehicle accompanied by his wife for evaluation of acute on chronic headache. Patient states that he has had headache for about a month.  Headache is mostly over the frontal area and he rates it a 10 x 10 in intensity at its worst.  He was initially associated with dizziness but that has improved as well as balance issues.  He denies having any blurred vision, no nausea, no vomiting or light sensitivity.  He does wear glasses but has not had his eyes checked in a while. He was seen by his primary care provider about 2 weeks ago for same and he was told that his dizziness was due to cerebral hypoperfusion.  Per neurology permissive hypertension was recommended and the patient's blood pressure needed to be between 678 and 938 systolic with diastolic of 10-17.  His primary care provider his Bystolic that he was taking for hypertension. He denies having any chest pain, no shortness of breath, no palpitations, no diaphoresis, no leg swelling, no abdominal pain, no changes in his bowel habits, urinary symptoms, no fever, no chills, no cough, no focal deficits or blurred vision. Labs show sodium 141, potassium 3.9, chloride 103, BUN 25 calcium 9.5, alkaline phosphatase 68, albumin 4.3, AST 27, ALT 16, total protein 7.5 total bilirubin 0.9, white count 8.6, hemoglobin hematocrit 44.3, MCV 96.5, RDW 13.4, platelet count 194, PT 13.3, INR 1.0, CT scan of the head without contrast shows No evidence of acute intracranial abnormality.Known chronic small-vessel infarcts  within the bilateral cerebral hemispheric white matter, basal ganglia, left thalamus and bilateral cerebellar hemispheres. Background mild-to-moderate chronic small vessel ischemic changes within the cerebral white matter.Generalized cerebral atrophy with superimposed disproportionate medial right temporal lobe volume loss. Minimal bilateral ethmoid sinus mucosal thickening. Trace fluid within the bilateral mastoid air cells. MRI/MRA of the brain shows no acute brain finding. Atrophy and chronic small-vessel ischemic changes as outlined above. No significant anterior circulation pathology. Severely diseased posterior cerebral arteries as described above. The appearance is unchanged compared to CT angiography of 05/19/2020. Twelve-lead EKG reviewed by me shows normal sinus rhythm, right bundle branch block with left anterior fascicular block  ED Course: Patient is a 80 year old male who presents to the ER for evaluation of worsening frontal headache over the last several days. His blood pressure was significantly elevated when he arrived to ER with systolic blood pressure of 510 mmHg and diastolic blood pressure of 120 mmHg. He received a dose of hydralazine 10 mg IV push, clonidine 0.1 mg as well as IV morphine and Dilaudid without any significant improvement in his blood pressure. He will be referred to observation status.  Review of Systems: As per HPI otherwise all other systems reviewed and negative.    Past Medical History:  Diagnosis Date   AAA (abdominal aortic aneurysm)    Actinic keratosis    Allergy    mild    Anxiety    CKD (chronic kidney disease) stage 3, GFR 30-59 ml/min (HCC)    Diverticulosis of colon (without mention of hemorrhage)    Duodenitis without mention of hemorrhage  Esophageal reflux    Hypertrophy of prostate without urinary obstruction and other lower urinary tract symptoms (LUTS)    Left sided ulcerative (chronic) colitis (HCC)    Mononeuritis of  unspecified site    Neuropathy    Nonspecific abnormal results of thyroid function study    Prostate cancer ([facility])    cryotherapy 2011 ([facility])   Schatzki's ring    Sleep apnea    does not use the CPAP   Stricture and stenosis of esophagus    Stroke (HCC)    left parietal, left parietal/occipital, right cerebellar, right pons   TIA (transient ischemic attack) 2017   pt states was told mini stroke   Unspecified essential hypertension     Past Surgical History:  Procedure Laterality Date   ABDOMINAL AORTIC ANEURYSM REPAIR     2018   BALLOON DILATION  03/22/2011   Procedure: BALLOON DILATION;  Surgeon: Inda Castle, MD;  Location: [facility];  Service: Endoscopy;  Laterality: N/A;  kelly/ebp   CHOLECYSTECTOMY     COLONOSCOPY     ENDOVASCULAR REPAIR/STENT GRAFT N/A 03/29/2016   Procedure: Endovascular Repair/Stent Graft;  Surgeon: Algernon Huxley, MD;  Location: [facility];  Service: Cardiovascular;  Laterality: N/A;   ESOPHAGOGASTRODUODENOSCOPY  03/22/2011   Procedure: ESOPHAGOGASTRODUODENOSCOPY (EGD);  Surgeon: Inda Castle, MD;  Location: [facility];  Service: Endoscopy;  Laterality: N/A;   PROSTATE SURGERY     UPPER GASTROINTESTINAL ENDOSCOPY       reports that he has never smoked. He has never used smokeless tobacco. He reports current alcohol use of about 14.0 standard drinks per week. He reports that he does not use drugs.  Allergies  Allergen Reactions   Metronidazole Anaphylaxis   Amoxicillin-Pot Clavulanate Other (See Comments)    UNKNOWN   Ciprofloxacin Other (See Comments)    UNKNOWN   Clindamycin/Lincomycin Other (See Comments)    Nausea, stomach pain, sweats   Mesalamine     REACTION: Intolerance    Family History  Problem Relation Age of Onset   Cancer Mother        unknown type   Snoring Father    Anesthesia problems Neg Hx    Hypotension Neg Hx    Malignant hyperthermia Neg Hx    Pseudochol deficiency Neg Hx    Colon cancer Neg Hx     Colon polyps Neg Hx    Esophageal cancer Neg Hx    Stomach cancer Neg Hx    Rectal cancer Neg Hx       Prior to Admission medications   Medication Sig Start Date End Date Taking? Authorizing Provider  aspirin EC 81 MG tablet Take 81 mg by mouth daily. Swallow whole.   Yes [provider]  rosuvastatin (CRESTOR) 40 MG tablet Take 1 tablet (40 mg total) by mouth daily. 11/02/20  Yes Susy Frizzle, MD    Physical Exam: Vitals:   11/15/20 1303 11/15/20 1304 11/15/20 1412 11/15/20 1525  BP: (!) 170/98 (!) 170/98 (!) 190/110 (!) 175/102  Pulse: 73  68 70  Resp: 19  17 15   Temp:      SpO2: 94%  96% 96%     Vitals:   11/15/20 1303 11/15/20 1304 11/15/20 1412 11/15/20 1525  BP: (!) 170/98 (!) 170/98 (!) 190/110 (!) 175/102  Pulse: 73  68 70  Resp: 19  17 15   Temp:      SpO2: 94%  96% 96%      Constitutional: Alert  and oriented x 3 . Not in any apparent distress HEENT:      Head: Normocephalic and atraumatic.         Eyes: PERLA, EOMI, Conjunctivae are normal. Sclera is non-icteric.       Mouth/Throat: Mucous membranes are moist.       Neck: Supple with no signs of meningismus. Cardiovascular: Regular rate and rhythm. No murmurs, gallops, or rubs. 2+ symmetrical distal pulses are present . No JVD. No LE edema Respiratory: Respiratory effort normal .Lungs sounds clear bilaterally. No wheezes, crackles, or rhonchi.  Gastrointestinal: Soft, non tender, and non distended with positive bowel sounds.  Genitourinary: No CVA tenderness. Musculoskeletal: Nontender with normal range of motion in all extremities. No cyanosis, or erythema of extremities. Neurologic:  Face is symmetric. Moving all extremities. No gross focal neurologic deficits .  Able to move all extremities Skin: Skin is warm, dry.  No rash or ulcers Psychiatric: Mood and affect are normal    Labs on Admission: I have personally reviewed following labs and imaging studies  CBC: Recent Labs  Lab  11/15/20 0944  WBC 8.6  NEUTROABS 5.6  HGB 15.4  HCT 44.3  MCV 96.5  PLT 409   Basic Metabolic Panel: Recent Labs  Lab 11/15/20 0944  NA 141  K 3.9  CL 104  CO2 27  GLUCOSE 93  BUN 25*  CREATININE 1.84*  CALCIUM 9.5   GFR: Estimated Creatinine Clearance: 38.9 mL/min (A) (by C-G formula based on SCr of 1.84 mg/dL (H)). Liver Function Tests: Recent Labs  Lab 11/15/20 0944  AST 27  ALT 16  ALKPHOS 68  BILITOT 0.9  PROT 7.5  ALBUMIN 4.3   No results for input(s): LIPASE, AMYLASE in the last 168 hours. No results for input(s): AMMONIA in the last 168 hours. Coagulation Profile: Recent Labs  Lab 11/15/20 0944  INR 1.0   Cardiac Enzymes: No results for input(s): CKTOTAL, CKMB, CKMBINDEX, TROPONINI in the last 168 hours. BNP (last 3 results) No results for input(s): PROBNP in the last 8760 hours. HbA1C: No results for input(s): HGBA1C in the last 72 hours. CBG: No results for input(s): GLUCAP in the last 168 hours. Lipid Profile: No results for input(s): CHOL, HDL, LDLCALC, TRIG, CHOLHDL, LDLDIRECT in the last 72 hours. Thyroid Function Tests: No results for input(s): TSH, T4TOTAL, FREET4, T3FREE, THYROIDAB in the last 72 hours. Anemia Panel: No results for input(s): VITAMINB12, FOLATE, FERRITIN, TIBC, IRON, RETICCTPCT in the last 72 hours. Urine analysis:    Component Value Date/Time   COLORURINE YELLOW 10/27/2019 [place] 10/27/2019 1431   LABSPEC 1.024 10/27/2019 1431   PHURINE 5.5 10/27/2019 1431   GLUCOSEU NEGATIVE 10/27/2019 1431   HGBUR NEGATIVE 10/27/2019 1431   HGBUR negative 03/22/2007 0816   BILIRUBINUR NEG 07/31/2014 0924   KETONESUR NEGATIVE 10/27/2019 1431   PROTEINUR 1+ (A) 10/27/2019 1431   UROBILINOGEN 0.2 07/31/2014 0924   NITRITE NEGATIVE 10/27/2019 1431   LEUKOCYTESUR NEGATIVE 10/27/2019 1431    Radiological Exams on Admission: CT HEAD WO CONTRAST  Result Date: 11/15/2020 CLINICAL DATA:  Intense headache.  Additional history provided: Severe headache and dizziness for the last month, worsening in the past 2 days, associated weakness and blurry vision. EXAM: CT HEAD WITHOUT CONTRAST TECHNIQUE: Contiguous axial images were obtained from the base of the skull through the vertex without intravenous contrast. COMPARISON:  CT angiogram head 05/19/2020. FINDINGS: Brain: Mild generalized cerebral atrophy. Superimposed disproportionate medial right temporal lobe volume loss. Known  chronic lacunar infarcts within the bilateral cerebral hemispheric white matter, basal ganglia, left thalamus and bilateral cerebellar hemispheres. Background mild-to-moderate patchy and ill-defined hypoattenuation within the cerebral white matter, nonspecific but compatible chronic small vessel ischemic disease. There is no acute intracranial hemorrhage. No demarcated cortical infarct. No extra-axial fluid collection. No evidence of an intracranial mass. No midline shift. Vascular: No hyperdense vessel.  Atherosclerotic calcifications. Skull: No calvarial fracture or focal suspicious osseous lesion. Sinuses/Orbits: Visualized orbits show no acute finding. Minimal mucosal thickening within the bilateral ethmoid air cells. Other: Trace fluid within the bilateral mastoid air cells. IMPRESSION: No evidence of acute intracranial abnormality. Known chronic small-vessel infarcts within the bilateral cerebral hemispheric white matter, basal ganglia, left thalamus and bilateral cerebellar hemispheres. Background mild-to-moderate chronic small vessel ischemic changes within the cerebral white matter. Generalized cerebral atrophy with superimposed disproportionate medial right temporal lobe volume loss. Minimal bilateral ethmoid sinus mucosal thickening. Trace fluid within the bilateral mastoid air cells. Electronically Signed   By: Kellie Simmering D.O.   On: 11/15/2020 11:44   MR ANGIO HEAD WO CONTRAST  Result Date: 11/15/2020 CLINICAL DATA:  Worsening  visual disturbance. Headache and balance disturbance. Dizziness. Symptoms worsening EXAM: MRI HEAD WITHOUT CONTRAST MRA HEAD WITHOUT CONTRAST TECHNIQUE: Multiplanar, multi-echo pulse sequences of the brain and surrounding structures were acquired without intravenous contrast. Angiographic images of the Circle of Willis were acquired using MRA technique without intravenous contrast. COMPARISON:  Head CT same day. MRI studies 2021. CT angiography 05/19/2020. FINDINGS: MRI HEAD FINDINGS Brain: Diffusion imaging does not show any acute or subacute infarction. There is an old small vessel infarction in the right pons. Few old small vessel cerebellar infarctions. Cerebral hemispheres show moderate chronic small-vessel ischemic changes of the white matter. No cortical or large vessel territory infarction. No mass lesion, hemorrhage, hydrocephalus or extra-axial collection. Vascular: Major vessels at the base of the brain show flow. Skull and upper cervical spine: Negative Sinuses/Orbits: Clear/normal Other: No fluid in the middle ears or mastoids. MRA HEAD FINDINGS Anterior circulation: Both internal carotid arteries are widely patent through the skull base and siphon regions. The anterior and middle cerebral vessels are patent without proximal stenosis, aneurysm or vascular malformation. Posterior circulation: Both vertebral arteries are patent to the basilar. Moderate stenosis of the distal left vertebral artery. No basilar stenosis. Flow is present in both superior cerebellar arteries. Both posterior cerebral arteries are severely diseased. On the left, there are serial stenoses, most severe at the P1 P2 junction. On the right, there appears to be occlusion or near occlusion of the P1 segment, but there is a patent posterior communicating artery giving supply to a small and irregular more distal right PCA. Anatomic variants: None other significant. IMPRESSION: No acute brain finding. Atrophy and chronic small-vessel  ischemic changes as outlined above. No significant anterior circulation pathology. Severely diseased posterior cerebral arteries as described above. The appearance is unchanged compared to CT angiography of 05/19/2020. Electronically Signed   By: Nelson Chimes M.D.   On: 11/15/2020 14:11   MR BRAIN WO CONTRAST  Result Date: 11/15/2020 CLINICAL DATA:  Worsening visual disturbance. Headache and balance disturbance. Dizziness. Symptoms worsening EXAM: MRI HEAD WITHOUT CONTRAST MRA HEAD WITHOUT CONTRAST TECHNIQUE: Multiplanar, multi-echo pulse sequences of the brain and surrounding structures were acquired without intravenous contrast. Angiographic images of the Circle of Willis were acquired using MRA technique without intravenous contrast. COMPARISON:  Head CT same day. MRI studies 2021. CT angiography 05/19/2020. FINDINGS: MRI HEAD FINDINGS Brain: Diffusion imaging  does not show any acute or subacute infarction. There is an old small vessel infarction in the right pons. Few old small vessel cerebellar infarctions. Cerebral hemispheres show moderate chronic small-vessel ischemic changes of the white matter. No cortical or large vessel territory infarction. No mass lesion, hemorrhage, hydrocephalus or extra-axial collection. Vascular: Major vessels at the base of the brain show flow. Skull and upper cervical spine: Negative Sinuses/Orbits: Clear/normal Other: No fluid in the middle ears or mastoids. MRA HEAD FINDINGS Anterior circulation: Both internal carotid arteries are widely patent through the skull base and siphon regions. The anterior and middle cerebral vessels are patent without proximal stenosis, aneurysm or vascular malformation. Posterior circulation: Both vertebral arteries are patent to the basilar. Moderate stenosis of the distal left vertebral artery. No basilar stenosis. Flow is present in both superior cerebellar arteries. Both posterior cerebral arteries are severely diseased. On the left, there  are serial stenoses, most severe at the P1 P2 junction. On the right, there appears to be occlusion or near occlusion of the P1 segment, but there is a patent posterior communicating artery giving supply to a small and irregular more distal right PCA. Anatomic variants: None other significant. IMPRESSION: No acute brain finding. Atrophy and chronic small-vessel ischemic changes as outlined above. No significant anterior circulation pathology. Severely diseased posterior cerebral arteries as described above. The appearance is unchanged compared to CT angiography of 05/19/2020. Electronically Signed   By: Nelson Chimes M.D.   On: 11/15/2020 14:11     Assessment/Plan Principal Problem:   Headache Active Problems:   Intracranial atherosclerosis   CA of prostate (HCC)   Hypertensive urgency   Benign hypertensive heart and CKD, stage 3 (GFR 30-59), w CHF [facility])    Patient is a 80 year old male who presents to the emergency room for evaluation of worsening frontal headache and elevated blood pressure     Headache Unclear etiology May be related to hypertensive urgency Will optimize blood pressure control Consult neurology Discussed with neurology will place patient on migraine cocktail with Tylenol, benadryl and Compazine q 6 hours x 3 doses    Hypertensive urgency with complications of stage IIIb chronic kidney disease We will start patient on amlodipine 10 mg daily Place patient on as needed hydralazine for systolic blood pressure greater than 160 mm Hg     History of CVA Continue statins and aspirin    DVT prophylaxis: Lovenox  Code Status: full code  Family Communication: Greater than 50% of time was spent discussing patient's condition and plan of care with him and his wife at the bedside.  All questions and concerns have been addressed.  They verbalize understanding and agree with the plan. Disposition Plan: Back to previous home environment Consults called: Neurology   Status:Observation    Collier Bullock MD [facility]     11/15/2020, 3:33 PM

## 2020-11-15 NOTE — ED Triage Notes (Addendum)
Pt comes via EMs with c/o severe headache and dizziness for the last month. Pt states last two days it got worse. Pt states intense headache. Pt hunched over in chair and appears in intense pain. Pt states weakness and blurry vision. Pt denies any blood thinners.  EMs reports neg stroke screen and no weakness noted. EMS reports stroke in past. BP-202/118 HR-76 NSR RA-96% CBG-105 18 g lac

## 2020-11-15 NOTE — ED Provider Notes (Signed)
[facility] Emergency Department Provider Note ____________________________________________   Event Date/Time   First MD Initiated Contact with Patient 11/15/20 [phone]     (approximate)  I have reviewed the triage vital signs and the nursing notes.  HISTORY  Chief Complaint Dizziness   HPI Matthew Luna is a 80 y.o. malewho presents to the ED for evaluation of headache and dizziness.  Chart review indicates history of stroke, HTN, HLD.  Intracranial multifocal stenosis from atherosclerotic disease on MRA from last year.  Patient presents to the ED, accompanied by his wife and son, for the evaluation of acute on chronic headache, blurry vision and balance difficulties.  They report chronic symptoms with greater than 1-2 months of aching global headache, balance issues and some vision changes attributed to old stroke.  They report that over the past 12-36 hours (their history is inconsistent) he has had severely worsening headache, vision changes and possibly worsening balance.  Patient reports a severe global headache, up to 9/10 intensity that is novel.  He denies any falls, syncope or trauma.  Reports associated blurry vision throughout his visual fields.   Past Medical History:  Diagnosis Date   AAA (abdominal aortic aneurysm)    Actinic keratosis    Allergy    mild    Anxiety    CKD (chronic kidney disease) stage 3, GFR 30-59 ml/min (HCC)    Diverticulosis of colon (without mention of hemorrhage)    Duodenitis without mention of hemorrhage    Esophageal reflux    Hypertrophy of prostate without urinary obstruction and other lower urinary tract symptoms (LUTS)    Left sided ulcerative (chronic) colitis (HCC)    Mononeuritis of unspecified site    Neuropathy    Nonspecific abnormal results of thyroid function study    Prostate cancer ([place])    cryotherapy 2011 ([facility])   Schatzki's ring    Sleep apnea    does not use the CPAP   Stricture and stenosis  of esophagus    Stroke (HCC)    left parietal, left parietal/occipital, right cerebellar, right pons   TIA (transient ischemic attack) 2017   pt states was told mini stroke   Unspecified essential hypertension     Patient Active Problem List   Diagnosis Date Noted   Chronic fatigue 07/31/2014   Urinary frequency 07/27/2014   Intracranial atherosclerosis 05/12/2014   Chronic kidney disease, stage 3 ([place]) 05/12/2014   TIA (transient ischemic attack) 05/10/2014   Diplopia 05/10/2014   Dizziness    Sleep apnea    AAA (abdominal aortic aneurysm) without rupture ([place]) 02/07/2013   Prostate cancer ([place])    Calculus of kidney 08/19/2012   CA of prostate ([place]) 08/19/2012   Disorder of bladder function 08/19/2012   Obstruction of urinary tract 08/19/2012   ABDOMINAL PAIN, EPIGASTRIC 08/25/2009   NEUROPATHY 05/02/2007   ACTINIC KERATOSIS 03/22/2007   THYROID FUNCTION TEST, ABNORMAL 03/22/2007   Essential hypertension 03/01/2007   BPH (benign prostatic hypertrophy) 03/01/2007   ESOPHAGEAL STRICTURE 02/23/2007   RASH-NONVESICULAR 01/08/2007   COLITIS, LEFT-SIDED ULCERATIVE 09/18/2006   DUODENITIS W/O HEMORRHAGE 03/13/2006    Past Surgical History:  Procedure Laterality Date   ABDOMINAL AORTIC ANEURYSM REPAIR     2018   BALLOON DILATION  03/22/2011   Procedure: BALLOON DILATION;  Surgeon: Inda Castle, MD;  Location: [facility];  Service: Endoscopy;  Laterality: N/A;  kelly/ebp   CHOLECYSTECTOMY     COLONOSCOPY     ENDOVASCULAR REPAIR/STENT GRAFT  N/A 03/29/2016   Procedure: Endovascular Repair/Stent Graft;  Surgeon: Algernon Huxley, MD;  Location: [facility];  Service: Cardiovascular;  Laterality: N/A;   ESOPHAGOGASTRODUODENOSCOPY  03/22/2011   Procedure: ESOPHAGOGASTRODUODENOSCOPY (EGD);  Surgeon: Inda Castle, MD;  Location: [facility];  Service: Endoscopy;  Laterality: N/A;   PROSTATE SURGERY     UPPER GASTROINTESTINAL ENDOSCOPY      Prior to Admission  medications   Medication Sig Start Date End Date Taking? Authorizing Provider  aspirin EC 81 MG tablet Take 81 mg by mouth daily. Swallow whole.   Yes [provider]  rosuvastatin (CRESTOR) 40 MG tablet Take 1 tablet (40 mg total) by mouth daily. 11/02/20  Yes Susy Frizzle, MD    Allergies Metronidazole, Amoxicillin-pot clavulanate, Ciprofloxacin, Clindamycin/lincomycin, and Mesalamine  Family History  Problem Relation Age of Onset   Cancer Mother        unknown type   Snoring Father    Anesthesia problems Neg Hx    Hypotension Neg Hx    Malignant hyperthermia Neg Hx    Pseudochol deficiency Neg Hx    Colon cancer Neg Hx    Colon polyps Neg Hx    Esophageal cancer Neg Hx    Stomach cancer Neg Hx    Rectal cancer Neg Hx     Social History Social History   Tobacco Use   Smoking status: Never   Smokeless tobacco: Never  Vaping Use   Vaping Use: Never used  Substance Use Topics   Alcohol use: Yes    Alcohol/week: 14.0 standard drinks    Types: 14 Glasses of wine per week    Comment: per pt -drinks 10 oz red wine every night!   Drug use: No    Review of Systems  Constitutional: No fever/chills Eyes: No visual changes. ENT: No sore throat. Cardiovascular: Denies chest pain. Respiratory: Denies shortness of breath. Gastrointestinal: No abdominal pain.  No nausea, no vomiting.  No diarrhea.  No constipation. Genitourinary: Negative for dysuria. Musculoskeletal: Negative for back pain. Skin: Negative for rash. Neurological: Negative for focal weakness or numbness. Positive for headache, blurry vision and poor balance.  Positive for photophobia. ____________________________________________   PHYSICAL EXAM:  VITAL SIGNS: Vitals:   11/15/20 1304 11/15/20 1412  BP: (!) 170/98 (!) 190/110  Pulse:  68  Resp:  17  Temp:    SpO2:  96%     Constitutional: Alert and oriented.  Appears uncomfortable, hard of hearing, no distress. Eyes: Conjunctivae are  normal. PERRL. EOMI. Head: Atraumatic. Reports mild bilateral temporal tenderness. Nose: No congestion/rhinnorhea. Mouth/Throat: Mucous membranes are moist.  Oropharynx non-erythematous. Neck: No stridor. No cervical spine tenderness to palpation. Cardiovascular: Normal rate, regular rhythm. Grossly normal heart sounds.  Good peripheral circulation. Respiratory: Normal respiratory effort.  No retractions. Lungs CTAB. Gastrointestinal: Soft , nondistended, nontender to palpation. No CVA tenderness. Musculoskeletal: No lower extremity tenderness nor edema.  No joint effusions. No signs of acute trauma. Neurologic:  Normal speech and language. No gross focal neurologic deficits are appreciated.  5/5 strength and sensation to light touch to all 4 extremities. Unable to elicit a particular field cut on visual field testing. EOM intact Cranial nerves otherwise intact.  No facial droop. Skin:  Skin is warm, dry and intact. No rash noted. Psychiatric: Mood and affect are normal. Speech and behavior are normal. ____________________________________________   LABS (all labs ordered are listed, but only abnormal results are displayed)  Labs Reviewed  DIFFERENTIAL - Abnormal; Notable  for the following components:      Result Value   Eosinophils Absolute 0.7 (*)    All other components within normal limits  COMPREHENSIVE METABOLIC PANEL - Abnormal; Notable for the following components:   BUN 25 (*)    Creatinine, Ser 1.84 (*)    GFR, Estimated 37 (*)    All other components within normal limits  PROTIME-INR  APTT  CBC  SEDIMENTATION RATE   ____________________________________________  12 Lead EKG  Sinus rhythm, rate of 76 bpm.  Bifascicular block.  No evidence of acute ischemia. ____________________________________________  RADIOLOGY  ED MD interpretation: CT head reviewed by me without evidence of acute intracranial pathology.  Official radiology report(s): CT HEAD WO  CONTRAST  Result Date: 11/15/2020 CLINICAL DATA:  Intense headache. Additional history provided: Severe headache and dizziness for the last month, worsening in the past 2 days, associated weakness and blurry vision. EXAM: CT HEAD WITHOUT CONTRAST TECHNIQUE: Contiguous axial images were obtained from the base of the skull through the vertex without intravenous contrast. COMPARISON:  CT angiogram head 05/19/2020. FINDINGS: Brain: Mild generalized cerebral atrophy. Superimposed disproportionate medial right temporal lobe volume loss. Known chronic lacunar infarcts within the bilateral cerebral hemispheric white matter, basal ganglia, left thalamus and bilateral cerebellar hemispheres. Background mild-to-moderate patchy and ill-defined hypoattenuation within the cerebral white matter, nonspecific but compatible chronic small vessel ischemic disease. There is no acute intracranial hemorrhage. No demarcated cortical infarct. No extra-axial fluid collection. No evidence of an intracranial mass. No midline shift. Vascular: No hyperdense vessel.  Atherosclerotic calcifications. Skull: No calvarial fracture or focal suspicious osseous lesion. Sinuses/Orbits: Visualized orbits show no acute finding. Minimal mucosal thickening within the bilateral ethmoid air cells. Other: Trace fluid within the bilateral mastoid air cells. IMPRESSION: No evidence of acute intracranial abnormality. Known chronic small-vessel infarcts within the bilateral cerebral hemispheric white matter, basal ganglia, left thalamus and bilateral cerebellar hemispheres. Background mild-to-moderate chronic small vessel ischemic changes within the cerebral white matter. Generalized cerebral atrophy with superimposed disproportionate medial right temporal lobe volume loss. Minimal bilateral ethmoid sinus mucosal thickening. Trace fluid within the bilateral mastoid air cells. Electronically Signed   By: Kellie Simmering D.O.   On: 11/15/2020 11:44   MR ANGIO HEAD  WO CONTRAST  Result Date: 11/15/2020 CLINICAL DATA:  Worsening visual disturbance. Headache and balance disturbance. Dizziness. Symptoms worsening EXAM: MRI HEAD WITHOUT CONTRAST MRA HEAD WITHOUT CONTRAST TECHNIQUE: Multiplanar, multi-echo pulse sequences of the brain and surrounding structures were acquired without intravenous contrast. Angiographic images of the Circle of Willis were acquired using MRA technique without intravenous contrast. COMPARISON:  Head CT same day. MRI studies 2021. CT angiography 05/19/2020. FINDINGS: MRI HEAD FINDINGS Brain: Diffusion imaging does not show any acute or subacute infarction. There is an old small vessel infarction in the right pons. Few old small vessel cerebellar infarctions. Cerebral hemispheres show moderate chronic small-vessel ischemic changes of the white matter. No cortical or large vessel territory infarction. No mass lesion, hemorrhage, hydrocephalus or extra-axial collection. Vascular: Major vessels at the base of the brain show flow. Skull and upper cervical spine: Negative Sinuses/Orbits: Clear/normal Other: No fluid in the middle ears or mastoids. MRA HEAD FINDINGS Anterior circulation: Both internal carotid arteries are widely patent through the skull base and siphon regions. The anterior and middle cerebral vessels are patent without proximal stenosis, aneurysm or vascular malformation. Posterior circulation: Both vertebral arteries are patent to the basilar. Moderate stenosis of the distal left vertebral artery. No basilar stenosis. Flow is present  in both superior cerebellar arteries. Both posterior cerebral arteries are severely diseased. On the left, there are serial stenoses, most severe at the P1 P2 junction. On the right, there appears to be occlusion or near occlusion of the P1 segment, but there is a patent posterior communicating artery giving supply to a small and irregular more distal right PCA. Anatomic variants: None other significant.  IMPRESSION: No acute brain finding. Atrophy and chronic small-vessel ischemic changes as outlined above. No significant anterior circulation pathology. Severely diseased posterior cerebral arteries as described above. The appearance is unchanged compared to CT angiography of 05/19/2020. Electronically Signed   By: Nelson Chimes M.D.   On: 11/15/2020 14:11   MR BRAIN WO CONTRAST  Result Date: 11/15/2020 CLINICAL DATA:  Worsening visual disturbance. Headache and balance disturbance. Dizziness. Symptoms worsening EXAM: MRI HEAD WITHOUT CONTRAST MRA HEAD WITHOUT CONTRAST TECHNIQUE: Multiplanar, multi-echo pulse sequences of the brain and surrounding structures were acquired without intravenous contrast. Angiographic images of the Circle of Willis were acquired using MRA technique without intravenous contrast. COMPARISON:  Head CT same day. MRI studies 2021. CT angiography 05/19/2020. FINDINGS: MRI HEAD FINDINGS Brain: Diffusion imaging does not show any acute or subacute infarction. There is an old small vessel infarction in the right pons. Few old small vessel cerebellar infarctions. Cerebral hemispheres show moderate chronic small-vessel ischemic changes of the white matter. No cortical or large vessel territory infarction. No mass lesion, hemorrhage, hydrocephalus or extra-axial collection. Vascular: Major vessels at the base of the brain show flow. Skull and upper cervical spine: Negative Sinuses/Orbits: Clear/normal Other: No fluid in the middle ears or mastoids. MRA HEAD FINDINGS Anterior circulation: Both internal carotid arteries are widely patent through the skull base and siphon regions. The anterior and middle cerebral vessels are patent without proximal stenosis, aneurysm or vascular malformation. Posterior circulation: Both vertebral arteries are patent to the basilar. Moderate stenosis of the distal left vertebral artery. No basilar stenosis. Flow is present in both superior cerebellar arteries. Both  posterior cerebral arteries are severely diseased. On the left, there are serial stenoses, most severe at the P1 P2 junction. On the right, there appears to be occlusion or near occlusion of the P1 segment, but there is a patent posterior communicating artery giving supply to a small and irregular more distal right PCA. Anatomic variants: None other significant. IMPRESSION: No acute brain finding. Atrophy and chronic small-vessel ischemic changes as outlined above. No significant anterior circulation pathology. Severely diseased posterior cerebral arteries as described above. The appearance is unchanged compared to CT angiography of 05/19/2020. Electronically Signed   By: Nelson Chimes M.D.   On: 11/15/2020 14:11    ____________________________________________   PROCEDURES and INTERVENTIONS  Procedure(s) performed (including Critical Care):  .1-3 Lead EKG Interpretation Performed by: Vladimir Crofts, MD Authorized by: Vladimir Crofts, MD     Interpretation: normal     ECG rate:  70   ECG rate assessment: normal     Rhythm: sinus rhythm     Ectopy: none     Conduction: normal    Medications  sodium chloride flush (NS) 0.9 % injection 3 mL (3 mLs Intravenous Not Given 11/15/20 1001)  butalbital-acetaminophen-caffeine (FIORICET) 50-325-40 MG per tablet 2 tablet (2 tablets Oral Given 11/15/20 1023)  morphine 4 MG/ML injection 4 mg (4 mg Intravenous Given 11/15/20 1024)  hydrALAZINE (APRESOLINE) injection 10 mg (10 mg Intravenous Given 11/15/20 1147)  cloNIDine (CATAPRES) tablet 0.1 mg (0.1 mg Oral Given 11/15/20 1304)  HYDROmorphone (DILAUDID) injection  0.5 mg (0.5 mg Intravenous Given 11/15/20 1306)    ____________________________________________   MDM / ED COURSE   80 year old male with known severe intracranial atherosclerosis, and history of stroke, presents to the ED with acutely worsening headache, dizziness and vision changes, without evidence of acute CVA on MRI, and required medical  admission for hypertensive urgency.  Systolic greater than 353 and otherwise normal vital signs on room air.  Due to his severe headache, started with IV analgesics with no significant provement of his symptoms or headache, and so transition to antihypertensive medications with some improvement of his headache and blood pressure.  Required IV doses of hydralazine and oral clonidine.  CT head without evidence of ICH.  Blood work is unremarkable.  No evidence of ACS.  MRI and MRA obtained without evidence of acute CVA or new vessel occlusion.  Due to his continued symptomatic elevated blood pressure, we will discuss with medicine for admission.  Clinical Course as of 11/15/20 1427  Mon Nov 15, 2020  1104 Reassessed.  Still quite hypertensive despite analgesia.  We will pursue IV antihypertensive medications [DS]  6144 Reassessed.  BP somewhat improved to systolic in the 315Q.  We discussed need for MRI and likely medical admission, they are agreeable. Patient reporting continued 10/10 headache unrelieved by morphine and Fioricet so far.  Requesting additional analgesia [DS]  0086 Pt in MRI. BP improving w clonidine [DS]  1400 Reassessed after MRI.  Reports feeling better. [DS]  [id number].  Improving headache, blood pressure remains quite elevated with systolics greater than 580.  Patient reports some desire to go home, but we discussed my recommendation for medical admission and wife's urging admission as well.  He relents and is agreeable to stay. [DS]    Clinical Course User Index [DS] Vladimir Crofts, MD    ____________________________________________   FINAL CLINICAL IMPRESSION(S) / ED DIAGNOSES  Final diagnoses:  Hypertensive urgency  Dizziness     ED Discharge Orders     None        Mirenda Baltazar Tamala Julian   Note:  This document was prepared using Dragon voice recognition software and may include unintentional dictation errors.    Vladimir Crofts, MD 11/15/20 [phone]

## 2020-11-16 ENCOUNTER — Other Ambulatory Visit: Payer: Self-pay | Admitting: Family Medicine

## 2020-11-16 DIAGNOSIS — I672 Cerebral atherosclerosis: Secondary | ICD-10-CM

## 2020-11-16 DIAGNOSIS — I160 Hypertensive urgency: Principal | ICD-10-CM

## 2020-11-16 DIAGNOSIS — R4182 Altered mental status, unspecified: Secondary | ICD-10-CM | POA: Diagnosis not present

## 2020-11-16 DIAGNOSIS — I130 Hypertensive heart and chronic kidney disease with heart failure and stage 1 through stage 4 chronic kidney disease, or unspecified chronic kidney disease: Secondary | ICD-10-CM | POA: Diagnosis not present

## 2020-11-16 DIAGNOSIS — N183 Chronic kidney disease, stage 3 unspecified: Secondary | ICD-10-CM | POA: Diagnosis not present

## 2020-11-16 LAB — CBC
HCT: 42.4 % (ref 39.0–52.0)
Hemoglobin: 14.3 g/dL (ref 13.0–17.0)
MCH: 32.4 pg (ref 26.0–34.0)
MCHC: 33.7 g/dL (ref 30.0–36.0)
MCV: 96.1 fL (ref 80.0–100.0)
Platelets: 181 10*3/uL (ref 150–400)
RBC: 4.41 MIL/uL (ref 4.22–5.81)
RDW: 13.4 % (ref 11.5–15.5)
WBC: 7.9 10*3/uL (ref 4.0–10.5)
nRBC: 0 % (ref 0.0–0.2)

## 2020-11-16 LAB — BASIC METABOLIC PANEL
Anion gap: 9 (ref 5–15)
BUN: 29 mg/dL — ABNORMAL HIGH (ref 8–23)
CO2: 25 mmol/L (ref 22–32)
Calcium: 9.2 mg/dL (ref 8.9–10.3)
Chloride: 105 mmol/L (ref 98–111)
Creatinine, Ser: 1.84 mg/dL — ABNORMAL HIGH (ref 0.61–1.24)
GFR, Estimated: 37 mL/min — ABNORMAL LOW (ref >60)
Glucose, Bld: 77 mg/dL (ref 70–99)
Potassium: 3.9 mmol/L (ref 3.5–5.1)
Sodium: 139 mmol/L (ref 135–145)

## 2020-11-16 LAB — VITAMIN B12: Vitamin B-12: 243 pg/mL (ref 180–914)

## 2020-11-16 LAB — AMMONIA: Ammonia: 10 umol/L (ref 9–35)

## 2020-11-16 LAB — T4, FREE: Free T4: 1.15 ng/dL — ABNORMAL HIGH (ref 0.61–1.12)

## 2020-11-16 LAB — HIV ANTIBODY (ROUTINE TESTING W REFLEX): HIV Screen 4th Generation wRfx: NONREACTIVE

## 2020-11-16 LAB — TSH: TSH: 5.127 u[IU]/mL — ABNORMAL HIGH (ref 0.350–4.500)

## 2020-11-16 MED ORDER — AMLODIPINE BESYLATE 10 MG PO TABS
10.0000 mg | ORAL_TABLET | Freq: Every day | ORAL | Status: DC
  Administered 2020-11-17: 10 mg via ORAL
  Filled 2020-11-16: qty 1

## 2020-11-16 MED ORDER — HYDRALAZINE HCL 20 MG/ML IJ SOLN
10.0000 mg | Freq: Four times a day (QID) | INTRAMUSCULAR | Status: DC | PRN
  Administered 2020-11-17: 10 mg via INTRAVENOUS
  Filled 2020-11-16: qty 1

## 2020-11-16 NOTE — Progress Notes (Signed)
PROGRESS NOTE    DARE SANGER  WNU:[id number] DOB: 02-May-1940 DOA: 11/15/2020 PCP: Susy Frizzle, MD    Brief Narrative:  Matthew Luna is a 80 y.o. male with medical history significant for hypertension, dyslipidemia, history of CVA, intracranial atherosclerotic disease who presents to the ER by private vehicle accompanied by his wife for evaluation of acute on chronic headache. Patient states that he has had headache for about a month.  Headache is mostly over the frontal area and he rates it a 10 x 10 in intensity at its worst.  He was initially associated with dizziness but that has improved as well as balance issues.  He denies having any blurred vision, no nausea, no vomiting or light sensitivity.  He does wear glasses but has not had his eyes checked in a while. He was seen by his primary care provider about 2 weeks ago for same and he was told that his dizziness was due to cerebral hypoperfusion.  Per neurology permissive hypertension was recommended and the patient's blood pressure needed to be between 034 and 742 systolic with diastolic of 59-56.  His primary care provider his Bystolic that he was taking for hypertension.  His blood pressure was significantly elevated when he arrived to ER with systolic blood pressure of 387 mmHg and diastolic blood pressure of 120 mmHg.  Consultants:  Neurology  Procedures:  CT scan of the head without contrast shows No evidence of acute intracranial abnormality.Known chronic small-vessel infarcts within the bilateral cerebral hemispheric white matter, basal ganglia, left thalamus and bilateral cerebellar hemispheres. Background mild-to-moderate chronic small vessel ischemic changes within the cerebral white matter.Generalized cerebral atrophy with superimposed disproportionate medial right temporal lobe volume loss. Minimal bilateral ethmoid sinus mucosal thickening. Trace fluid within the bilateral mastoid air cells. MRI/MRA of the brain  shows no acute brain finding. Atrophy and chronic small-vessel ischemic changes as outlined above. No significant anterior circulation pathology. Severely diseased posterior cerebral arteries as described above. The appearance is unchanged compared to CT angiography of 05/19/2020.     Antimicrobials:      Subjective: Pt asked me if he can go home . Wife at bedside. No sob or cp. Later I was told by nursing he is confused.   Objective: Vitals:   11/16/20 0100 11/16/20 0200 11/16/20 0329 11/16/20 0620  BP: (!) 141/82 (!) 122/94 (!) 153/97 140/88  Pulse: 60 60 62 70  Resp: 18 18 18 16   Temp:      SpO2: 98% 97% 98% 98%   No intake or output data in the 24 hours ending 11/16/20 0838 There were no vitals filed for this visit.  Examination:  General exam: Appears calm and comfortable  Respiratory system: Clear to auscultation. Respiratory effort normal. Cardiovascular system: S1 & S2 heard, RRR. No JVD, murmurs, rubs, gallops or clicks.  Gastrointestinal system: Abdomen is nondistended, soft and nontender. . Normal bowel sounds heard. Central nervous system: Alert and oriented grossly intact Extremities: mild edema  Psychiatry: Mood & affect appropriate in current setting.     Data Reviewed: I have personally reviewed following labs and imaging studies  CBC: Recent Labs  Lab 11/15/20 0944 11/16/20 0613  WBC 8.6 7.9  NEUTROABS 5.6  --   HGB 15.4 14.3  HCT 44.3 42.4  MCV 96.5 96.1  PLT 194 564   Basic Metabolic Panel: Recent Labs  Lab 11/15/20 0944 11/16/20 0613  NA 141 139  K 3.9 3.9  CL 104 105  CO2 27 25  GLUCOSE 93 77  BUN 25* 29*  CREATININE 1.84* 1.84*  CALCIUM 9.5 9.2   GFR: Estimated Creatinine Clearance: 38.9 mL/min (A) (by C-G formula based on SCr of 1.84 mg/dL (H)). Liver Function Tests: Recent Labs  Lab 11/15/20 0944  AST 27  ALT 16  ALKPHOS 68  BILITOT 0.9  PROT 7.5  ALBUMIN 4.3   No results for input(s): LIPASE, AMYLASE in the last  168 hours. No results for input(s): AMMONIA in the last 168 hours. Coagulation Profile: Recent Labs  Lab 11/15/20 0944  INR 1.0   Cardiac Enzymes: No results for input(s): CKTOTAL, CKMB, CKMBINDEX, TROPONINI in the last 168 hours. BNP (last 3 results) No results for input(s): PROBNP in the last 8760 hours. HbA1C: No results for input(s): HGBA1C in the last 72 hours. CBG: No results for input(s): GLUCAP in the last 168 hours. Lipid Profile: No results for input(s): CHOL, HDL, LDLCALC, TRIG, CHOLHDL, LDLDIRECT in the last 72 hours. Thyroid Function Tests: No results for input(s): TSH, T4TOTAL, FREET4, T3FREE, THYROIDAB in the last 72 hours. Anemia Panel: No results for input(s): VITAMINB12, FOLATE, FERRITIN, TIBC, IRON, RETICCTPCT in the last 72 hours. Sepsis Labs: No results for input(s): PROCALCITON, LATICACIDVEN in the last 168 hours.  Recent Results (from the past 240 hour(s))  Resp Panel by RT-PCR (Flu A&B, Covid) Nasopharyngeal Swab     Status: None   Collection Time: 11/15/20  6:05 PM   Specimen: Nasopharyngeal Swab; Nasopharyngeal(NP) swabs in vial transport medium  Result Value Ref Range Status   SARS Coronavirus 2 by RT PCR NEGATIVE NEGATIVE Final    Comment: (NOTE) SARS-CoV-2 target nucleic acids are NOT DETECTED.  The SARS-CoV-2 RNA is generally detectable in upper respiratory specimens during the acute phase of infection. The lowest concentration of SARS-CoV-2 viral copies this assay can detect is 138 copies/mL. A negative result does not preclude SARS-Cov-2 infection and should not be used as the sole basis for treatment or other patient management decisions. A negative result may occur with  improper specimen collection/handling, submission of specimen other than nasopharyngeal swab, presence of viral mutation(s) within the areas targeted by this assay, and inadequate number of viral copies(<138 copies/mL). A negative result must be combined with clinical  observations, patient history, and epidemiological information. The expected result is Negative.  Fact Sheet for Patients:  EntrepreneurPulse.com.au  Fact Sheet for Healthcare Providers:  IncredibleEmployment.be  This test is no t yet approved or cleared by the [place] FDA and  has been authorized for detection and/or diagnosis of SARS-CoV-2 by FDA under an Emergency Use Authorization (EUA). This EUA will remain  in effect (meaning this test can be used) for the duration of the COVID-19 declaration under Section 564(b)(1) of the Act, 21 U.S.C.section 360bbb-3(b)(1), unless the authorization is terminated  or revoked sooner.       Influenza A by PCR NEGATIVE NEGATIVE Final   Influenza B by PCR NEGATIVE NEGATIVE Final    Comment: (NOTE) The Xpert Xpress SARS-CoV-2/FLU/RSV plus assay is intended as an aid in the diagnosis of influenza from Nasopharyngeal swab specimens and should not be used as a sole basis for treatment. Nasal washings and aspirates are unacceptable for Xpert Xpress SARS-CoV-2/FLU/RSV testing.  Fact Sheet for Patients: EntrepreneurPulse.com.au  Fact Sheet for Healthcare Providers: IncredibleEmployment.be  This test is not yet approved or cleared by the [place] FDA and has been authorized for detection and/or diagnosis of SARS-CoV-2 by FDA under an Emergency Use Authorization (EUA). This EUA will remain  in effect (meaning this test can be used) for the duration of the COVID-19 declaration under Section 564(b)(1) of the Act, 21 U.S.C. section 360bbb-3(b)(1), unless the authorization is terminated or revoked.  Performed at [facility], [address], [place], [place] [address]          Radiology Studies: CT HEAD WO CONTRAST  Result Date: 11/15/2020 CLINICAL DATA:  Intense headache. Additional history provided: Severe headache and dizziness for the last  month, worsening in the past 2 days, associated weakness and blurry vision. EXAM: CT HEAD WITHOUT CONTRAST TECHNIQUE: Contiguous axial images were obtained from the base of the skull through the vertex without intravenous contrast. COMPARISON:  CT angiogram head 05/19/2020. FINDINGS: Brain: Mild generalized cerebral atrophy. Superimposed disproportionate medial right temporal lobe volume loss. Known chronic lacunar infarcts within the bilateral cerebral hemispheric white matter, basal ganglia, left thalamus and bilateral cerebellar hemispheres. Background mild-to-moderate patchy and ill-defined hypoattenuation within the cerebral white matter, nonspecific but compatible chronic small vessel ischemic disease. There is no acute intracranial hemorrhage. No demarcated cortical infarct. No extra-axial fluid collection. No evidence of an intracranial mass. No midline shift. Vascular: No hyperdense vessel.  Atherosclerotic calcifications. Skull: No calvarial fracture or focal suspicious osseous lesion. Sinuses/Orbits: Visualized orbits show no acute finding. Minimal mucosal thickening within the bilateral ethmoid air cells. Other: Trace fluid within the bilateral mastoid air cells. IMPRESSION: No evidence of acute intracranial abnormality. Known chronic small-vessel infarcts within the bilateral cerebral hemispheric white matter, basal ganglia, left thalamus and bilateral cerebellar hemispheres. Background mild-to-moderate chronic small vessel ischemic changes within the cerebral white matter. Generalized cerebral atrophy with superimposed disproportionate medial right temporal lobe volume loss. Minimal bilateral ethmoid sinus mucosal thickening. Trace fluid within the bilateral mastoid air cells. Electronically Signed   By: Kellie Simmering D.O.   On: 11/15/2020 11:44   MR ANGIO HEAD WO CONTRAST  Result Date: 11/15/2020 CLINICAL DATA:  Worsening visual disturbance. Headache and balance disturbance. Dizziness. Symptoms  worsening EXAM: MRI HEAD WITHOUT CONTRAST MRA HEAD WITHOUT CONTRAST TECHNIQUE: Multiplanar, multi-echo pulse sequences of the brain and surrounding structures were acquired without intravenous contrast. Angiographic images of the Circle of Willis were acquired using MRA technique without intravenous contrast. COMPARISON:  Head CT same day. MRI studies 2021. CT angiography 05/19/2020. FINDINGS: MRI HEAD FINDINGS Brain: Diffusion imaging does not show any acute or subacute infarction. There is an old small vessel infarction in the right pons. Few old small vessel cerebellar infarctions. Cerebral hemispheres show moderate chronic small-vessel ischemic changes of the white matter. No cortical or large vessel territory infarction. No mass lesion, hemorrhage, hydrocephalus or extra-axial collection. Vascular: Major vessels at the base of the brain show flow. Skull and upper cervical spine: Negative Sinuses/Orbits: Clear/normal Other: No fluid in the middle ears or mastoids. MRA HEAD FINDINGS Anterior circulation: Both internal carotid arteries are widely patent through the skull base and siphon regions. The anterior and middle cerebral vessels are patent without proximal stenosis, aneurysm or vascular malformation. Posterior circulation: Both vertebral arteries are patent to the basilar. Moderate stenosis of the distal left vertebral artery. No basilar stenosis. Flow is present in both superior cerebellar arteries. Both posterior cerebral arteries are severely diseased. On the left, there are serial stenoses, most severe at the P1 P2 junction. On the right, there appears to be occlusion or near occlusion of the P1 segment, but there is a patent posterior communicating artery giving supply to a small and irregular more distal right PCA. Anatomic variants: None  other significant. IMPRESSION: No acute brain finding. Atrophy and chronic small-vessel ischemic changes as outlined above. No significant anterior circulation  pathology. Severely diseased posterior cerebral arteries as described above. The appearance is unchanged compared to CT angiography of 05/19/2020. Electronically Signed   By: Nelson Chimes M.D.   On: 11/15/2020 14:11   MR BRAIN WO CONTRAST  Result Date: 11/15/2020 CLINICAL DATA:  Worsening visual disturbance. Headache and balance disturbance. Dizziness. Symptoms worsening EXAM: MRI HEAD WITHOUT CONTRAST MRA HEAD WITHOUT CONTRAST TECHNIQUE: Multiplanar, multi-echo pulse sequences of the brain and surrounding structures were acquired without intravenous contrast. Angiographic images of the Circle of Willis were acquired using MRA technique without intravenous contrast. COMPARISON:  Head CT same day. MRI studies 2021. CT angiography 05/19/2020. FINDINGS: MRI HEAD FINDINGS Brain: Diffusion imaging does not show any acute or subacute infarction. There is an old small vessel infarction in the right pons. Few old small vessel cerebellar infarctions. Cerebral hemispheres show moderate chronic small-vessel ischemic changes of the white matter. No cortical or large vessel territory infarction. No mass lesion, hemorrhage, hydrocephalus or extra-axial collection. Vascular: Major vessels at the base of the brain show flow. Skull and upper cervical spine: Negative Sinuses/Orbits: Clear/normal Other: No fluid in the middle ears or mastoids. MRA HEAD FINDINGS Anterior circulation: Both internal carotid arteries are widely patent through the skull base and siphon regions. The anterior and middle cerebral vessels are patent without proximal stenosis, aneurysm or vascular malformation. Posterior circulation: Both vertebral arteries are patent to the basilar. Moderate stenosis of the distal left vertebral artery. No basilar stenosis. Flow is present in both superior cerebellar arteries. Both posterior cerebral arteries are severely diseased. On the left, there are serial stenoses, most severe at the P1 P2 junction. On the right,  there appears to be occlusion or near occlusion of the P1 segment, but there is a patent posterior communicating artery giving supply to a small and irregular more distal right PCA. Anatomic variants: None other significant. IMPRESSION: No acute brain finding. Atrophy and chronic small-vessel ischemic changes as outlined above. No significant anterior circulation pathology. Severely diseased posterior cerebral arteries as described above. The appearance is unchanged compared to CT angiography of 05/19/2020. Electronically Signed   By: Nelson Chimes M.D.   On: 11/15/2020 14:11        Scheduled Meds:  amLODipine  5 mg Oral Daily   aspirin EC  81 mg Oral Daily   enoxaparin (LOVENOX) injection  40 mg Subcutaneous Q24H   melatonin  2.5 mg Oral QHS   rosuvastatin  40 mg Oral Daily   sodium chloride flush  3 mL Intravenous Once   Continuous Infusions:  Assessment & Plan:   Principal Problem:   Headache Active Problems:   Intracranial atherosclerosis   CA of prostate (HCC)   Hypertensive urgency   Benign hypertensive heart and CKD, stage 3 (GFR 30-59), w CHF [facility])   Patient is a 80 year old male who presents to the emergency room for evaluation of worsening frontal headache and elevated blood pressure         Headache Confusion Neurology was consulted yesterday.   He presented with headache which was treated with significant polypharmacy (butalbital and opioids initially, followed by migraine cocktail on neurologist recommendation including Benadryl and Compazine).  Now with confusion.   Per neurology he may have delirium.   Neurology has ordered reversible causes including EEG, TSH, B1, MMA, B12, RPR, HIV for reversible causes of dementia  Patient found with visual spatial difficulties  per neurology and needs neurocognitive testing and neurology follow-up  BP goal 130-150 /70-90          Hypertensive urgency with complications of stage IIIb chronic kidney disease He improving.   Will increase amlodipine to 10 mg daily Continue IV hydralazine BP goal 130-150 /70-90         History of CVA Continue statins and aspirin     DVT prophylaxis: Lovenox Code Status: Full Family Communication: wife at bedside   Disposition Plan:  Status is: Observation  The patient remains OBS appropriate and will d/c before 2 midnights.     Dispo: The patient is from: Home              Anticipated d/c is to: Home              Patient currently is not medically stable to d/c.              Difficult to place patient No           LOS: 0 days   Time spent: 35 minutes with more than 50% on [place], MD [facility] Pager [phone]  If 7PM-7AM, please contact night-coverage 11/16/2020, 8:38 AM

## 2020-11-16 NOTE — ED Notes (Signed)
Pt seems more confused after family left. Amery MD made aware

## 2020-11-16 NOTE — ED Notes (Signed)
Breakfast tray at bedside 

## 2020-11-16 NOTE — ED Notes (Signed)
Family back at bedside. Family states that pt has been confused but progressively getting more confused over the past week.

## 2020-11-16 NOTE — ED Notes (Signed)
Pt refused to sleep on the bed - no hospital bed available at this time. Pt provided a recliner to promote comfort - Call bell within reach.

## 2020-11-16 NOTE — ED Notes (Signed)
Provider notified of the patients request to have a sleep aide PRN. Also made aware of the patients decline to wear cardiac monitoring leads - this RN will continue to monitor.

## 2020-11-16 NOTE — ED Notes (Signed)
Lunch tray given to pt.

## 2020-11-16 NOTE — ED Notes (Signed)
Pt to EEG at this time.

## 2020-11-16 NOTE — ED Notes (Signed)
MD at bedside. 

## 2020-11-16 NOTE — Consult Note (Signed)
Neurology Consultation Reason for Consult: Headache Requesting Physician: Tochukwu Agbata  CC: Headache   History is obtained from: Patient and chart review  HPI: Matthew Luna is a 80 y.o. male PMHx significant for HTN, HLD, CKD, OSA non complaint with CPAP, severe intracranial atherosclerosis particularly of the posterior circulation, and other chronic medical issues as listed below.    Per brief review of the chart and history provided by Dr. Francine Graven, acute on chronic headache, with some recent medication changes. Goal BP is 130-150 / 70-90. August 16th visit he was symptomatic with dizziness after taking morning Bystolic. BP at appointment was 136/88, with plan to stop Bystolic. Subsequently, on 10/04 his PCP noted "Patient does not seem to recollect our previous discussion.  I explained to him that his dizziness is due to cerebral hypoperfusion in the posterior cerebral artery.  He also has several strokes present on his previous MRI some of which include the cerebellum.  Therefore our focus needs to be on preventing further strokes.  Neurology has recommended permissive hypertension with blood pressure between 130 and 150/70-90.  He is certainly within that range today.  I emphasized that the patient needs to take aspirin 81 mg daily and Crestor 40 mg daily.  I recommended that he discontinue all other medication as I am concerned that gabapentin could exacerbate dizziness.  I want him to throw away all old pill bottles at home to avoid confusion because this is where he got the clindamycin from.  Check CBC CMP and lipid panel"   He presents to ED today with worsening HA x 2 days and BPs in the 200s/120s, has not tried any specific treatment at home. Has received fiorecet 2 tabs, clonidine 0.1 mg, hydralazine 10 mg, dilaudid 0.5 mg, morphine 4 mg here without significant improvement.  My initial curbside recommendations overnight included blood pressure control with target BPs 150-160/85-95, and  scheduled migraine cocktails of Tylenol, Compazine and Benadryl with encouragement of CPAP use pending full consultation today  Blood pressure control has been somewhat labile, ranging from 120s to 170s / 80s to 100s.  However his headache has been fully resolved although he was noted by nursing to have increasing confusion after his family left.  Indeed at the time of my evaluation at around 2 PM he is unable to tell me why he is in the hospital, and is disoriented to time  His wife notes that his headaches began a month or so ago and were gradual in onset.  She notes he has had a gradual loss of weight (approximately 35 pounds in 2 years which she attributes to reduced appetite and old age).  At baseline he he is still driving has not had any issues getting lost and she would feel safe with him in the car, manages his finances without any concern for missed bill payments etc.  She notes that intermittently has some dizziness or blurred vision with a headache, but otherwise has had no vision complaints, no signs or symptoms of infection including fevers.  She feels his headaches are typically worse later in the day, although sometimes he mentions them when he wakes up as well they do not wake him up at night from sleep.  They have not been associated with nausea, vomiting, photophobia or phonophobia per her report.  She does not note that he has had episodic confusion in the past, though she does say he has normal memory issues that she associates with aging and that it is important  for physicians to write down the changes in his medication for him for them to review together.  ROS: Limited from patient secondary to his disorientation/confusion.    Past Medical History:  Diagnosis Date   AAA (abdominal aortic aneurysm)    Actinic keratosis    Allergy    mild    Anxiety    CKD (chronic kidney disease) stage 3, GFR 30-59 ml/min (HCC)    Diverticulosis of colon (without mention of hemorrhage)     Duodenitis without mention of hemorrhage    Esophageal reflux    Hypertrophy of prostate without urinary obstruction and other lower urinary tract symptoms (LUTS)    Left sided ulcerative (chronic) colitis (HCC)    Mononeuritis of unspecified site    Neuropathy    Nonspecific abnormal results of thyroid function study    Prostate cancer ([place])    cryotherapy 2011 ([facility])   Schatzki's ring    Sleep apnea    does not use the CPAP   Stricture and stenosis of esophagus    Stroke (HCC)    left parietal, left parietal/occipital, right cerebellar, right pons   TIA (transient ischemic attack) 2017   pt states was told mini stroke   Unspecified essential hypertension    Past Surgical History:  Procedure Laterality Date   ABDOMINAL AORTIC ANEURYSM REPAIR     2018   BALLOON DILATION  03/22/2011   Procedure: BALLOON DILATION;  Surgeon: Inda Castle, MD;  Location: [facility];  Service: Endoscopy;  Laterality: N/A;  kelly/ebp   CHOLECYSTECTOMY     COLONOSCOPY     ENDOVASCULAR REPAIR/STENT GRAFT N/A 03/29/2016   Procedure: Endovascular Repair/Stent Graft;  Surgeon: Algernon Huxley, MD;  Location: [facility];  Service: Cardiovascular;  Laterality: N/A;   ESOPHAGOGASTRODUODENOSCOPY  03/22/2011   Procedure: ESOPHAGOGASTRODUODENOSCOPY (EGD);  Surgeon: Inda Castle, MD;  Location: [facility];  Service: Endoscopy;  Laterality: N/A;   PROSTATE SURGERY     UPPER GASTROINTESTINAL ENDOSCOPY     Current Meds  Medication Sig   aspirin EC 81 MG tablet Take 81 mg by mouth daily. Swallow whole.   rosuvastatin (CRESTOR) 40 MG tablet Take 1 tablet (40 mg total) by mouth daily.     Family History  Problem Relation Age of Onset   Cancer Mother        unknown type   Snoring Father    Anesthesia problems Neg Hx    Hypotension Neg Hx    Malignant hyperthermia Neg Hx    Pseudochol deficiency Neg Hx    Colon cancer Neg Hx    Colon polyps Neg Hx    Esophageal cancer Neg Hx    Stomach cancer  Neg Hx    Rectal cancer Neg Hx    Social History:  reports that he has never smoked. He has never used smokeless tobacco. He reports current alcohol use of about 14.0 standard drinks per week. He reports that he does not use drugs.   Exam: Current vital signs: BP (!) 176/90 (BP Location: Left Arm)   Pulse 77   Temp 98 F (36.7 C)   Resp 15   SpO2 96%  Vital signs in last 24 hours: Pulse Rate:  [60-77] 77 (10/18 1234) Resp:  [14-18] 15 (10/18 1234) BP: (122-190)/(81-110) 176/90 (10/18 1234) SpO2:  [94 %-98 %] 96 % (10/18 1234)   Physical Exam  Constitutional: Appears well-developed and well-nourished.  Psych: Pleasant and cooperative but intermittently confused/disoriented Eyes: No scleral injection HENT:  No oropharyngeal obstruction.  MSK: no joint deformities.  Cardiovascular: Normal rate and regular rhythm.  Respiratory: Effort normal, non-labored breathing GI: Soft.  No distension. There is no tenderness.  Skin: Warm dry and intact visible skin  Neuro: Mental Status: Patient is awake, alert, but fairly disoriented.  Initially cannot tell me what month it is.  Later is able to tell me it is October but then perseverates answering October when I asked him the year.  Was able to tell me he is 80 years old with some hesitation.  Some minor naming issues for example he called his pinky finger his index finger, with occasional hesitancy with speech.  Repetition was intact.  He did struggle with visuospatial processing at times, for example not being able to position his arms and legs correctly in confrontational strength testing and having difficulty with copying complex finger positions Cranial Nerves: II: Visual Fields are full. Pupils are equal, round, and reactive to light.  III,IV, VI: EOMI without ptosis or diploplia.  Mildly saccadic pursuits V: Facial sensation is symmetric to light touch VII: Facial movement is symmetric.  VIII: hearing is intact to voice X: Uvula  elevates symmetrically XI: Shoulder shrug is symmetric. XII: tongue is midline without atrophy or fasciculations.  Motor: Tone is normal. Bulk is normal. 5/5 strength was present in all four extremities.  Sensory: Sensation is symmetric to light touch and temperature in the arms and legs. Deep Tendon Reflexes: 2+ and symmetric in the biceps and patellae.  Cerebellar: FNF and HKS are intact bilaterally  I have reviewed labs in epic and the results pertinent to this consultation are:   Basic Metabolic Panel: Recent Labs  Lab 11/15/20 0944 11/16/20 0613  NA 141 139  K 3.9 3.9  CL 104 105  CO2 27 25  GLUCOSE 93 77  BUN 25* 29*  CREATININE 1.84* 1.84*  CALCIUM 9.5 9.2              Creatinine is at his recent baseline  CBC: Recent Labs  Lab 11/15/20 0944 11/16/20 0613  WBC 8.6 7.9  NEUTROABS 5.6  --   HGB 15.4 14.3  HCT 44.3 42.4  MCV 96.5 96.1  PLT 194 181    Coagulation Studies: Recent Labs    11/15/20 0944  LABPROT 13.3  INR 1.0      Lab Results  Component Value Date   VITAMINB12 364 06/01/2015    Lab Results  Component Value Date   TSH 9.94 (H) 06/01/2015     I have reviewed the images obtained:  MRI brain, MRA head and neck (11/15/2020) and CTA 05/19/20 personally reviewed, agree with radiology:   No acute brain finding. Atrophy and chronic small-vessel ischemic changes as outlined above.   No significant anterior circulation pathology.   Severely diseased posterior cerebral arteries as described above. The appearance is unchanged compared to CT angiography of 05/19/2020.     Impression: This is a 80 year old man with past medical history as above presenting with headache which was treated with significant polypharmacy (butalbital and opiates initially, followed by migraine cocktail on my recommendation including Benadryl and Compazine).  He now has significant confusion which may be delirium, but given PCP report of patient being confused about  his medication regimen as well, will complete reversible causes of encephalopathy work-up as well as EEG to exclude other significant pathology.  Please avoid further deliriogenic medications at this time  Recommendations: -Headache resolved, hold migraine cocktails, Fioricet, antihistamines, etc and use Tylenol  as needed -Blood pressure goal 130-150/70-90 -Routine EEG -TSH, B1, MMA, B12, RPR, HIV for reversible causes of dementia work-up -Given visuospatial difficulties on examination today, recommend outpatient neurocognitive testing and neurology follow-up -Neurology will follow  Lesleigh Noe MD-PhD [facility] [phone] [facility] coverage for [facility] is from 8 AM to 4 AM in-house and 4 PM to 8 PM by telephone/video. 8 PM to 8 AM emergent questions or overnight urgent questions should be addressed to Teleneurology On-call or [facility] neurohospitalist; contact information can be found on AMION

## 2020-11-16 NOTE — Procedures (Signed)
Patient Name: Matthew Luna  MRN: [id number]  Epilepsy Attending: Lora Havens  Referring Physician/Provider: Dr Lesleigh Noe Date: 11/16/2020 Duration: 20.06 mins  Patient history: 80yo M with ams. EEG to evaluate for seizure  Level of alertness: Awake  AEDs during EEG study: None  Technical aspects: This EEG study was done with scalp electrodes positioned according to the 10-20 International system of electrode placement. Electrical activity was acquired at a sampling rate of 500Hz  and reviewed with a high frequency filter of 70Hz  and a low frequency filter of 1Hz . EEG data were recorded continuously and digitally stored.   Description: The posterior dominant rhythm consists of 6 Hz activity of moderate voltage (25-35 uV) seen predominantly in posterior head regions, symmetric and reactive to eye opening and eye closing. EEG showed continuous generalized predominantly 5 to 6 Hz theta as well as intermittent generalized 2-3hz  delta slowing. Physiologic photic driving was not seen during photic stimulation.  Hyperventilation was not performed.     ABNORMALITY - Continuous slow, generalized  IMPRESSION: This study is suggestive of mild to moderate diffuse encephalopathy, nonspecific etiology. No seizures or epileptiform discharges were seen throughout the recording.  Temple Ewart Barbra Sarks

## 2020-11-16 NOTE — Progress Notes (Signed)
Eeg done 

## 2020-11-17 DIAGNOSIS — I10 Essential (primary) hypertension: Secondary | ICD-10-CM | POA: Diagnosis not present

## 2020-11-17 DIAGNOSIS — I672 Cerebral atherosclerosis: Secondary | ICD-10-CM | POA: Diagnosis not present

## 2020-11-17 LAB — BASIC METABOLIC PANEL
Anion gap: 10 (ref 5–15)
BUN: 28 mg/dL — ABNORMAL HIGH (ref 8–23)
CO2: 26 mmol/L (ref 22–32)
Calcium: 9.5 mg/dL (ref 8.9–10.3)
Chloride: 105 mmol/L (ref 98–111)
Creatinine, Ser: 1.87 mg/dL — ABNORMAL HIGH (ref 0.61–1.24)
GFR, Estimated: 36 mL/min — ABNORMAL LOW (ref >60)
Glucose, Bld: 84 mg/dL (ref 70–99)
Potassium: 3.6 mmol/L (ref 3.5–5.1)
Sodium: 141 mmol/L (ref 135–145)

## 2020-11-17 LAB — CBC
HCT: 44.7 % (ref 39.0–52.0)
Hemoglobin: 15.5 g/dL (ref 13.0–17.0)
MCH: 33.7 pg (ref 26.0–34.0)
MCHC: 34.7 g/dL (ref 30.0–36.0)
MCV: 97.2 fL (ref 80.0–100.0)
Platelets: 204 10*3/uL (ref 150–400)
RBC: 4.6 MIL/uL (ref 4.22–5.81)
RDW: 13.7 % (ref 11.5–15.5)
WBC: 8.5 10*3/uL (ref 4.0–10.5)
nRBC: 0 % (ref 0.0–0.2)

## 2020-11-17 LAB — RPR: RPR Ser Ql: NONREACTIVE

## 2020-11-17 MED ORDER — VITAMIN B-12 1000 MCG PO TABS: 1000.0000 ug | ORAL_TABLET | Freq: Every day | ORAL | Status: DC

## 2020-11-17 MED ORDER — CYANOCOBALAMIN 1000 MCG PO TABS: 1000.0000 ug | ORAL_TABLET | Freq: Every day | ORAL | 0 refills | Status: AC

## 2020-11-17 MED ORDER — NEBIVOLOL HCL 10 MG PO TABS: 10.0000 mg | ORAL_TABLET | Freq: Every day | ORAL | 0 refills | Status: DC

## 2020-11-17 MED ORDER — CYANOCOBALAMIN 1000 MCG/ML IJ SOLN
1000.0000 ug | Freq: Every day | INTRAMUSCULAR | Status: DC
  Administered 2020-11-17: 1000 ug via INTRAMUSCULAR
  Filled 2020-11-17 (×2): qty 1

## 2020-11-17 NOTE — ED Notes (Signed)
Informed RN bed assigned 

## 2020-11-17 NOTE — Progress Notes (Signed)
OT Cancellation Note  Patient Details Name: Matthew Luna MRN: [id number] DOB: October 20, 1940   Cancelled Treatment:    Reason Eval/Treat Not Completed: OT screened, no needs identified, will sign off. Order received, chart reviewed. Per conversation w/ PT, Pt back to baseline functional independence. No skilled OT needs identified. Will sign off. Please re-consult if additional needs arise.    Nino Glow, OTS  Nino Glow  11/17/2020, 1:01 PM

## 2020-11-17 NOTE — Progress Notes (Signed)
Physical Therapy Evaluation Patient Details Name: Matthew Luna MRN: [id number] DOB: [birth year] Today's Date: 11/17/2020  History of Present Illness  Matthew Luna is a 80 y.o. male PMHx significant for HTN, HLD, CKD, OSA non complaint with CPAP, severe intracranial atherosclerosis particularly of the posterior circulation, and other chronic medical issues.    Clinical Impression  Pt sitting EOB upon arrival and agreeable to therapy.  Pt was able to perform all mobility with good technique and requiring no assistance from therapist.  Pt also able to perform gait training with no assistance from therapist.  Pt currently at reported baseline level according to son and wife.  Patient is at baseline, all education completed, and time is given to address all questions/concerns. No additional skilled PT services needed at this time, PT signing off. PT recommends daily ambulation ad lib or with nursing staff as needed to prevent deconditioning.       Recommendations for follow up therapy are one component of a multi-disciplinary discharge planning process, led by the attending physician.  Recommendations may be updated based on patient status, additional functional criteria and insurance authorization.  Follow Up Recommendations No PT follow up    Equipment Recommendations  None recommended by PT    Recommendations for Other Services       Precautions / Restrictions Precautions Precautions: Fall Restrictions Weight Bearing Restrictions: No      Mobility  Bed Mobility Overal bed mobility: Independent                  Transfers Overall transfer level: Independent                  Ambulation/Gait Ambulation/Gait assistance: Independent Gait Distance (Feet): 340 Feet   Gait Pattern/deviations: WFL(Within Functional Limits) Gait velocity: decreased      Stairs            Wheelchair Mobility    Modified Rankin (Stroke Patients Only)       Balance  Overall balance assessment: Independent                                           Pertinent Vitals/Pain Pain Assessment: No/denies pain    Home Living Family/patient expects to be discharged to:: Private residence Living Arrangements: Spouse/significant other Available Help at Discharge: Family Type of Home: House Home Access: Level entry     Home Layout: One level        Prior Function Level of Independence: Independent         Comments: Used to work for [employer] in [place].     Hand Dominance   Dominant Hand: Right    Extremity/Trunk Assessment   Upper Extremity Assessment Upper Extremity Assessment: Generalized weakness    Lower Extremity Assessment Lower Extremity Assessment: Generalized weakness       Communication   Communication: No difficulties  Cognition Arousal/Alertness: Awake/alert Behavior During Therapy: WFL for tasks assessed/performed Overall Cognitive Status: Within Functional Limits for tasks assessed                                        General Comments      Exercises     Assessment/Plan    PT Assessment Patent does not need any further PT services  PT Problem List  PT Treatment Interventions      PT Goals (Current goals can be found in the Care Plan section)  Acute Rehab PT Goals Patient Stated Goal: go home. PT Goal Formulation: With patient Time For Goal Achievement: 12/01/20 Potential to Achieve Goals: Good    Frequency     Barriers to discharge        Co-evaluation               AM-PAC PT "6 Clicks" Mobility  Outcome Measure Help needed turning from your back to your side while in a flat bed without using bedrails?: None Help needed moving from lying on your back to sitting on the side of a flat bed without using bedrails?: None Help needed moving to and from a bed to a chair (including a wheelchair)?: None Help needed standing up from a chair using your  arms (e.g., wheelchair or bedside chair)?: None Help needed to walk in hospital room?: None Help needed climbing 3-5 steps with a railing? : None 6 Click Score: 24    End of Session   Activity Tolerance: Patient tolerated treatment well Patient left: with call bell/phone within reach;with family/visitor present Nurse Communication: Mobility status PT Visit Diagnosis: Muscle weakness (generalized) (M62.81)    Time: 3254-9826 PT Time Calculation (min) (ACUTE ONLY): 16 min   Charges:   PT Evaluation $PT Eval Low Complexity: 1 Low PT Treatments $Gait Training: 8-22 mins        Gwenlyn Saran, PT, DPT 11/17/20, 12:39 PM   Christie Nottingham 11/17/2020, 12:37 PM

## 2020-11-17 NOTE — Progress Notes (Signed)
Neurology Progress Note  Patient ID: Matthew Luna is a 80 y.o. male PMHx significant for HTN, HLD, CKD, OSA non complaint with CPAP, severe intracranial atherosclerosis particularly of the posterior circulation, and other chronic medical issues.   Initially consulted for: Headache, with subsequent work-up for encephalopathy/delirium for which I am following  Major interval events/Subjective: -Feels back to baseline, denies any headache today, -Eager to go home -Wife at bedside confirms he is at his normal baseline  Exam: Current vital signs: BP (!) 172/94   Pulse 63   Temp 97.7 F (36.5 C) (Oral)   Resp 17   SpO2 97%  Vital signs in last 24 hours: Temp:  [97.5 F (36.4 C)-97.7 F (36.5 C)] 97.7 F (36.5 C) (10/19 0300) Pulse Rate:  [63-95] 63 (10/19 0737) Resp:  [14-18] 17 (10/19 0737) BP: (138-180)/(90-116) 172/94 (10/19 0737) SpO2:  [95 %-100 %] 97 % (10/19 0737)   Gen: In bed, comfortable  Resp: non-labored breathing, no grossly audible wheezing Cardiac: Perfusing extremities well  Abd: soft, nt  Neuro: MS: Awake, alert, oriented to person, place, time, situation, fluent speech, intact naming and repetition.  Much improved visuospatial examination with only mild difficulty for copying one of the complex finger positions CN: Pupils equal round reactive to light, visual fields intact, EOMI, face symmetric, tongue midline Motor: No pronator drift.  Using all extremities equally and freely   Pertinent Data:  Basic Metabolic Panel: Recent Labs  Lab 11/15/20 0944 11/16/20 0613  NA 141 139  K 3.9 3.9  CL 104 105  CO2 27 25  GLUCOSE 93 77  BUN 25* 29*  CREATININE 1.84* 1.84*  CALCIUM 9.5 9.2    CBC: Recent Labs  Lab 11/15/20 0944 11/16/20 0613  WBC 8.6 7.9  NEUTROABS 5.6  --   HGB 15.4 14.3  HCT 44.3 42.4  MCV 96.5 96.1  PLT 194 181    Coagulation Studies: Recent Labs    11/15/20 0944  LABPROT 13.3  INR 1.0    Lab Results  Component Value  Date   VITAMINB12 243 11/16/2020    RPR negative  Results for SHUN, PLETZ (MRN [id number]) as of 11/17/2020 08:15  Ref. Range 11/16/2020 14:36  Ammonia Latest Ref Range: 9 - 35 umol/L 10   Results for FOUAD, TAUL (MRN [id number]) as of 11/17/2020 08:15  Ref. Range 11/16/2020 14:36  TSH Latest Ref Range: 0.350 - 4.500 uIU/mL 5.127 (H)  T4,Free(Direct) Latest Ref Range: 0.61 - 1.12 ng/dL 1.15 (H)   Unresulted Labs (From admission, onward)     Start     Ordered   11/22/20 0500  Creatinine, serum  (enoxaparin (LOVENOX)    CrCl >/= 30 ml/min)  Weekly,   STAT     Comments: while on enoxaparin therapy    11/15/20 1526   11/16/20 1417  Vitamin B1  Once,   STAT        11/16/20 1418   11/16/20 1417  Methylmalonic acid, serum  Once,   STAT        11/16/20 1418             Impression: Likely headache secondary to hypertensive emergency with subsequent medication induced delirium; low B12 may make him more susceptible   Recommendations: - Low normal B12, goal > 500, will initiate 1000 mcg IM x 7 days followed by 1000 mcg daily PO - Continue BP goal 130-150/70-90  - Appropriate for discharge from neurological perspective and outpatient follow-up, outpatient neurocognitive eval  if episodic confusion continues - PCP to follow up pending labs   Lesleigh Noe MD-PhD [facility] [phone]

## 2020-11-17 NOTE — Discharge Summary (Signed)
Physician Discharge Summary  Matthew Luna EXB:[id number] DOB: [birth year] DOA: 11/15/2020  PCP: Susy Frizzle, MD  Admit date: 11/15/2020 Discharge date: 11/17/2020  Admitted From: home  Disposition:  home   Recommendations for Outpatient Follow-up:  Follow up with PCP in 1 week F/u w/ neuro, Dr. Melrose Nakayama, in 2 weeks for neurocognitive testing   Home Health: no  Equipment/Devices:  Discharge Condition: stable  CODE STATUS: full  Diet recommendation: Heart Healthy   Brief/Interim Summary: HPI was taken from Dr. Francine Graven: Matthew Luna is a 80 y.o. male with medical history significant for hypertension, dyslipidemia, history of CVA, intracranial atherosclerotic disease who presents to the ER by private vehicle accompanied by his wife for evaluation of acute on chronic headache. Patient states that he has had headache for about a month.  Headache is mostly over the frontal area and he rates it a 10 x 10 in intensity at its worst.  He was initially associated with dizziness but that has improved as well as balance issues.  He denies having any blurred vision, no nausea, no vomiting or light sensitivity.  He does wear glasses but has not had his eyes checked in a while. He was seen by his primary care provider about 2 weeks ago for same and he was told that his dizziness was due to cerebral hypoperfusion.  Per neurology permissive hypertension was recommended and the patient's blood pressure needed to be between 102 and 725 systolic with diastolic of 36-64.  His primary care provider his Bystolic that he was taking for hypertension. He denies having any chest pain, no shortness of breath, no palpitations, no diaphoresis, no leg swelling, no abdominal pain, no changes in his bowel habits, urinary symptoms, no fever, no chills, no cough, no focal deficits or blurred vision. Labs show sodium 141, potassium 3.9, chloride 103, BUN 25 calcium 9.5, alkaline phosphatase 68, albumin 4.3, AST 27, ALT  16, total protein 7.5 total bilirubin 0.9, white count 8.6, hemoglobin hematocrit 44.3, MCV 96.5, RDW 13.4, platelet count 194, PT 13.3, INR 1.0, CT scan of the head without contrast shows No evidence of acute intracranial abnormality.Known chronic small-vessel infarcts within the bilateral cerebral hemispheric white matter, basal ganglia, left thalamus and bilateral cerebellar hemispheres. Background mild-to-moderate chronic small vessel ischemic changes within the cerebral white matter.Generalized cerebral atrophy with superimposed disproportionate medial right temporal lobe volume loss. Minimal bilateral ethmoid sinus mucosal thickening. Trace fluid within the bilateral mastoid air cells. MRI/MRA of the brain shows no acute brain finding. Atrophy and chronic small-vessel ischemic changes as outlined above. No significant anterior circulation pathology. Severely diseased posterior cerebral arteries as described above. The appearance is unchanged compared to CT angiography of 05/19/2020. Twelve-lead EKG reviewed by me shows normal sinus rhythm, right bundle branch block with left anterior fascicular block   ED Course: Patient is a 80 year old male who presents to the ER for evaluation of worsening frontal headache over the last several days. His blood pressure was significantly elevated when he arrived to ER with systolic blood pressure of 403 mmHg and diastolic blood pressure of 120 mmHg. He received a dose of hydralazine 10 mg IV push, clonidine 0.1 mg as well as IV morphine and Dilaudid without any significant improvement in his blood pressure. He will be referred to observation status.   Hospital course from Dr. Jimmye Norman 11/17/20: By the time I saw the pt, the pt's headache had resolved and mental status was back to baseline. TSH, FT4 were both elevated and B12  was on the low end of normal. Pt was started on B12 supplementation prior to d/c. B1 was pending at the time of d/c. PT evaluated the  pt but did not recommend any further therapy. Of note, pt should f/u outpatient w/ neuro to have neurocognitive testing. For more information, please see previous process/consult notes.   Discharge Diagnoses:  Principal Problem:   Headache Active Problems:   Intracranial atherosclerosis   CA of prostate (HCC)   Hypertensive urgency   Benign hypertensive heart and CKD, stage 3 (GFR 30-59), w CHF ([place])  Headache: etiology unclear. Given butalbital, opioids, compazine, benadryl. Resolved   Acute metabolic encephalopathy: will need outpatient neurocognitive testing as per neuro. Elevated TSH. B12 is low end of normal, will start B12 supplements. B1 pending still. Back to baseline   Elevated TSH: w/ elevated free T4. Will need repeat testing as outpatient. Pt and pt's family at bedside verbalized their understanding     Hypertensive urgency:  urgency resolved but still w/ HTN. Continue on nebivolol    CKDIIIb: Cr is trending up slightly from day prior. Avoid nephrotoxic meds    Hx of CVA: continue on statin, aspirin   Discharge Instructions  Discharge Instructions     Diet - low sodium heart healthy   Complete by: As directed    Discharge instructions   Complete by: As directed    F/u w/ PCP in 1 week. Will need repeat TSH, FT3 as both were elevated at [facility]. Also will need to re-check B12 levels as it was on the low end of normal at [facility]. F/u w/ neurology for neurocognitive testing.   Increase activity slowly   Complete by: As directed       Allergies as of 11/17/2020       Reactions   Metronidazole Anaphylaxis   Amoxicillin-pot Clavulanate Other (See Comments)   UNKNOWN   Ciprofloxacin Other (See Comments)   UNKNOWN   Clindamycin/lincomycin Other (See Comments)   Nausea, stomach pain, sweats   Mesalamine    REACTION: Intolerance        Medication List     TAKE these medications    aspirin EC 81 MG tablet Take 81 mg by mouth daily. Swallow whole.    cyanocobalamin 1000 MCG tablet Take 1 tablet (1,000 mcg total) by mouth daily. Start taking on: November 24, 2020   nebivolol 10 MG tablet Commonly known as: Bystolic Take 1 tablet (10 mg total) by mouth daily.   rosuvastatin 40 MG tablet Commonly known as: CRESTOR Take 1 tablet (40 mg total) by mouth daily.        Follow-up Information     Anabel Bene, MD Follow up in 2 week(s).   Specialty: Neurology Why: needs neurocognitive testing Contact information: [facility] [place] [address] [phone]         Susy Frizzle, MD Follow up in 1 week(s).   Specialty: Family Medicine Why: needs hospital f/u,  f/u on TSH, FT4 and B12 & B1 levels Contact information: [address] [place] [address] [phone]                Allergies  Allergen Reactions   Metronidazole Anaphylaxis   Amoxicillin-Pot Clavulanate Other (See Comments)    UNKNOWN   Ciprofloxacin Other (See Comments)    UNKNOWN   Clindamycin/Lincomycin Other (See Comments)    Nausea, stomach pain, sweats   Mesalamine     REACTION: Intolerance  Consultations: Neuro    Procedures/Studies: CT HEAD WO CONTRAST  Result Date: 11/15/2020 CLINICAL DATA:  Intense headache. Additional history provided: Severe headache and dizziness for the last month, worsening in the past 2 days, associated weakness and blurry vision. EXAM: CT HEAD WITHOUT CONTRAST TECHNIQUE: Contiguous axial images were obtained from the base of the skull through the vertex without intravenous contrast. COMPARISON:  CT angiogram head 05/19/2020. FINDINGS: Brain: Mild generalized cerebral atrophy. Superimposed disproportionate medial right temporal lobe volume loss. Known chronic lacunar infarcts within the bilateral cerebral hemispheric white matter, basal ganglia, left thalamus and bilateral cerebellar hemispheres. Background mild-to-moderate patchy and ill-defined  hypoattenuation within the cerebral white matter, nonspecific but compatible chronic small vessel ischemic disease. There is no acute intracranial hemorrhage. No demarcated cortical infarct. No extra-axial fluid collection. No evidence of an intracranial mass. No midline shift. Vascular: No hyperdense vessel.  Atherosclerotic calcifications. Skull: No calvarial fracture or focal suspicious osseous lesion. Sinuses/Orbits: Visualized orbits show no acute finding. Minimal mucosal thickening within the bilateral ethmoid air cells. Other: Trace fluid within the bilateral mastoid air cells. IMPRESSION: No evidence of acute intracranial abnormality. Known chronic small-vessel infarcts within the bilateral cerebral hemispheric white matter, basal ganglia, left thalamus and bilateral cerebellar hemispheres. Background mild-to-moderate chronic small vessel ischemic changes within the cerebral white matter. Generalized cerebral atrophy with superimposed disproportionate medial right temporal lobe volume loss. Minimal bilateral ethmoid sinus mucosal thickening. Trace fluid within the bilateral mastoid air cells. Electronically Signed   By: Kellie Simmering D.O.   On: 11/15/2020 11:44   MR ANGIO HEAD WO CONTRAST  Result Date: 11/15/2020 CLINICAL DATA:  Worsening visual disturbance. Headache and balance disturbance. Dizziness. Symptoms worsening EXAM: MRI HEAD WITHOUT CONTRAST MRA HEAD WITHOUT CONTRAST TECHNIQUE: Multiplanar, multi-echo pulse sequences of the brain and surrounding structures were acquired without intravenous contrast. Angiographic images of the Circle of Willis were acquired using MRA technique without intravenous contrast. COMPARISON:  Head CT same day. MRI studies 2021. CT angiography 05/19/2020. FINDINGS: MRI HEAD FINDINGS Brain: Diffusion imaging does not show any acute or subacute infarction. There is an old small vessel infarction in the right pons. Few old small vessel cerebellar infarctions. Cerebral  hemispheres show moderate chronic small-vessel ischemic changes of the white matter. No cortical or large vessel territory infarction. No mass lesion, hemorrhage, hydrocephalus or extra-axial collection. Vascular: Major vessels at the base of the brain show flow. Skull and upper cervical spine: Negative Sinuses/Orbits: Clear/normal Other: No fluid in the middle ears or mastoids. MRA HEAD FINDINGS Anterior circulation: Both internal carotid arteries are widely patent through the skull base and siphon regions. The anterior and middle cerebral vessels are patent without proximal stenosis, aneurysm or vascular malformation. Posterior circulation: Both vertebral arteries are patent to the basilar. Moderate stenosis of the distal left vertebral artery. No basilar stenosis. Flow is present in both superior cerebellar arteries. Both posterior cerebral arteries are severely diseased. On the left, there are serial stenoses, most severe at the P1 P2 junction. On the right, there appears to be occlusion or near occlusion of the P1 segment, but there is a patent posterior communicating artery giving supply to a small and irregular more distal right PCA. Anatomic variants: None other significant. IMPRESSION: No acute brain finding. Atrophy and chronic small-vessel ischemic changes as outlined above. No significant anterior circulation pathology. Severely diseased posterior cerebral arteries as described above. The appearance is unchanged compared to CT angiography of 05/19/2020. Electronically Signed   By: Jan Fireman.D.  On: 11/15/2020 14:11   MR BRAIN WO CONTRAST  Result Date: 11/15/2020 CLINICAL DATA:  Worsening visual disturbance. Headache and balance disturbance. Dizziness. Symptoms worsening EXAM: MRI HEAD WITHOUT CONTRAST MRA HEAD WITHOUT CONTRAST TECHNIQUE: Multiplanar, multi-echo pulse sequences of the brain and surrounding structures were acquired without intravenous contrast. Angiographic images of the Circle  of Willis were acquired using MRA technique without intravenous contrast. COMPARISON:  Head CT same day. MRI studies 2021. CT angiography 05/19/2020. FINDINGS: MRI HEAD FINDINGS Brain: Diffusion imaging does not show any acute or subacute infarction. There is an old small vessel infarction in the right pons. Few old small vessel cerebellar infarctions. Cerebral hemispheres show moderate chronic small-vessel ischemic changes of the white matter. No cortical or large vessel territory infarction. No mass lesion, hemorrhage, hydrocephalus or extra-axial collection. Vascular: Major vessels at the base of the brain show flow. Skull and upper cervical spine: Negative Sinuses/Orbits: Clear/normal Other: No fluid in the middle ears or mastoids. MRA HEAD FINDINGS Anterior circulation: Both internal carotid arteries are widely patent through the skull base and siphon regions. The anterior and middle cerebral vessels are patent without proximal stenosis, aneurysm or vascular malformation. Posterior circulation: Both vertebral arteries are patent to the basilar. Moderate stenosis of the distal left vertebral artery. No basilar stenosis. Flow is present in both superior cerebellar arteries. Both posterior cerebral arteries are severely diseased. On the left, there are serial stenoses, most severe at the P1 P2 junction. On the right, there appears to be occlusion or near occlusion of the P1 segment, but there is a patent posterior communicating artery giving supply to a small and irregular more distal right PCA. Anatomic variants: None other significant. IMPRESSION: No acute brain finding. Atrophy and chronic small-vessel ischemic changes as outlined above. No significant anterior circulation pathology. Severely diseased posterior cerebral arteries as described above. The appearance is unchanged compared to CT angiography of 05/19/2020. Electronically Signed   By: Nelson Chimes M.D.   On: 11/15/2020 14:11   EEG adult  Result  Date: 11/16/2020 Lora Havens, MD     11/16/2020  5:58 PM Patient Name: Matthew Luna MRN: [id number] Epilepsy Attending: Lora Havens Referring Physician/Provider: Dr Lesleigh Noe Date: 11/16/2020 Duration: 20.06 mins Patient history: 80yo M with ams. EEG to evaluate for seizure Level of alertness: Awake AEDs during EEG study: None Technical aspects: This EEG study was done with scalp electrodes positioned according to the 10-20 International system of electrode placement. Electrical activity was acquired at a sampling rate of 500Hz  and reviewed with a high frequency filter of 70Hz  and a low frequency filter of 1Hz . EEG data were recorded continuously and digitally stored. Description: The posterior dominant rhythm consists of 6 Hz activity of moderate voltage (25-35 uV) seen predominantly in posterior head regions, symmetric and reactive to eye opening and eye closing. EEG showed continuous generalized predominantly 5 to 6 Hz theta as well as intermittent generalized 2-3hz  delta slowing. Physiologic photic driving was not seen during photic stimulation.  Hyperventilation was not performed.   ABNORMALITY - Continuous slow, generalized IMPRESSION: This study is suggestive of mild to moderate diffuse encephalopathy, nonspecific etiology. No seizures or epileptiform discharges were seen throughout the recording. Priyanka O Yadav   (Echo, Carotid, EGD, Colonoscopy, ERCP)    Subjective: Pt denies any complaints    Discharge Exam: Vitals:   11/17/20 0939 11/17/20 1210  BP: (!) 163/112 (!) 151/92  Pulse: 87 87  Resp: 18 18  Temp: 98.2 F (36.8 C) 98  F (36.7 C)  SpO2: 100% 95%   Vitals:   11/17/20 0300 11/17/20 0737 11/17/20 0939 11/17/20 1210  BP: (!) 142/108 (!) 172/94 (!) 163/112 (!) 151/92  Pulse: 78 63 87 87  Resp: 18 17 18 18   Temp: 97.7 F (36.5 C)  98.2 F (36.8 C) 98 F (36.7 C)  TempSrc: Oral  Oral Oral  SpO2: 95% 97% 100% 95%  Weight:   87.7 kg 87.7 kg  Height:    6\' 3"   (1.905 m)    General: Pt is alert, awake, not in acute distress Cardiovascular: S1/S2 +, no rubs, no gallops Respiratory: CTA bilaterally, no wheezing, no rhonchi Abdominal: Soft, NT, ND, bowel sounds + Extremities: no edema, no cyanosis    The results of significant diagnostics from this hospitalization (including imaging, microbiology, ancillary and laboratory) are listed below for reference.     Microbiology: Recent Results (from the past 240 hour(s))  Resp Panel by RT-PCR (Flu A&B, Covid) Nasopharyngeal Swab     Status: None   Collection Time: 11/15/20  6:05 PM   Specimen: Nasopharyngeal Swab; Nasopharyngeal(NP) swabs in vial transport medium  Result Value Ref Range Status   SARS Coronavirus 2 by RT PCR NEGATIVE NEGATIVE Final    Comment: (NOTE) SARS-CoV-2 target nucleic acids are NOT DETECTED.  The SARS-CoV-2 RNA is generally detectable in upper respiratory specimens during the acute phase of infection. The lowest concentration of SARS-CoV-2 viral copies this assay can detect is 138 copies/mL. A negative result does not preclude SARS-Cov-2 infection and should not be used as the sole basis for treatment or other patient management decisions. A negative result may occur with  improper specimen collection/handling, submission of specimen other than nasopharyngeal swab, presence of viral mutation(s) within the areas targeted by this assay, and inadequate number of viral copies(<138 copies/mL). A negative result must be combined with clinical observations, patient history, and epidemiological information. The expected result is Negative.  Fact Sheet for Patients:  EntrepreneurPulse.com.au  Fact Sheet for Healthcare Providers:  IncredibleEmployment.be  This test is no t yet approved or cleared by the [place] FDA and  has been authorized for detection and/or diagnosis of SARS-CoV-2 by FDA under an Emergency Use Authorization (EUA).  This EUA will remain  in effect (meaning this test can be used) for the duration of the COVID-19 declaration under Section 564(b)(1) of the Act, 21 U.S.C.section 360bbb-3(b)(1), unless the authorization is terminated  or revoked sooner.       Influenza A by PCR NEGATIVE NEGATIVE Final   Influenza B by PCR NEGATIVE NEGATIVE Final    Comment: (NOTE) The Xpert Xpress SARS-CoV-2/FLU/RSV plus assay is intended as an aid in the diagnosis of influenza from Nasopharyngeal swab specimens and should not be used as a sole basis for treatment. Nasal washings and aspirates are unacceptable for Xpert Xpress SARS-CoV-2/FLU/RSV testing.  Fact Sheet for Patients: EntrepreneurPulse.com.au  Fact Sheet for Healthcare Providers: IncredibleEmployment.be  This test is not yet approved or cleared by the [place] FDA and has been authorized for detection and/or diagnosis of SARS-CoV-2 by FDA under an Emergency Use Authorization (EUA). This EUA will remain in effect (meaning this test can be used) for the duration of the COVID-19 declaration under Section 564(b)(1) of the Act, 21 U.S.C. section 360bbb-3(b)(1), unless the authorization is terminated or revoked.  Performed at [facility], [address] [place], [place] [address]      Labs: BNP (last 3 results) No results for input(s): BNP in the  last 8760 hours. Basic Metabolic Panel: Recent Labs  Lab 11/15/20 0944 11/16/20 0613 11/17/20 0951  NA 141 139 141  K 3.9 3.9 3.6  CL 104 105 105  CO2 27 25 26   GLUCOSE 93 77 84  BUN 25* 29* 28*  CREATININE 1.84* 1.84* 1.87*  CALCIUM 9.5 9.2 9.5   Liver Function Tests: Recent Labs  Lab 11/15/20 0944  AST 27  ALT 16  ALKPHOS 68  BILITOT 0.9  PROT 7.5  ALBUMIN 4.3   No results for input(s): LIPASE, AMYLASE in the last 168 hours. Recent Labs  Lab 11/16/20 1436  AMMONIA 10   CBC: Recent Labs  Lab 11/15/20 0944 11/16/20 0613  11/17/20 0951  WBC 8.6 7.9 8.5  NEUTROABS 5.6  --   --   HGB 15.4 14.3 15.5  HCT 44.3 42.4 44.7  MCV 96.5 96.1 97.2  PLT 194 181 204   Cardiac Enzymes: No results for input(s): CKTOTAL, CKMB, CKMBINDEX, TROPONINI in the last 168 hours. BNP: Invalid input(s): POCBNP CBG: No results for input(s): GLUCAP in the last 168 hours. D-Dimer No results for input(s): DDIMER in the last 72 hours. Hgb A1c No results for input(s): HGBA1C in the last 72 hours. Lipid Profile No results for input(s): CHOL, HDL, LDLCALC, TRIG, CHOLHDL, LDLDIRECT in the last 72 hours. Thyroid function studies Recent Labs    11/16/20 1436  TSH 5.127*   Anemia work up Recent Labs    11/16/20 1436  VITAMINB12 243   Urinalysis    Component Value Date/Time   COLORURINE YELLOW 10/27/2019 [place] 10/27/2019 1431   LABSPEC 1.024 10/27/2019 1431   PHURINE 5.5 10/27/2019 1431   GLUCOSEU NEGATIVE 10/27/2019 1431   HGBUR NEGATIVE 10/27/2019 1431   HGBUR negative 03/22/2007 0816   BILIRUBINUR NEG 07/31/2014 0924   KETONESUR NEGATIVE 10/27/2019 1431   PROTEINUR 1+ (A) 10/27/2019 1431   UROBILINOGEN 0.2 07/31/2014 0924   NITRITE NEGATIVE 10/27/2019 1431   LEUKOCYTESUR NEGATIVE 10/27/2019 1431   Sepsis Labs Invalid input(s): PROCALCITONIN,  WBC,  LACTICIDVEN Microbiology Recent Results (from the past 240 hour(s))  Resp Panel by RT-PCR (Flu A&B, Covid) Nasopharyngeal Swab     Status: None   Collection Time: 11/15/20  6:05 PM   Specimen: Nasopharyngeal Swab; Nasopharyngeal(NP) swabs in vial transport medium  Result Value Ref Range Status   SARS Coronavirus 2 by RT PCR NEGATIVE NEGATIVE Final    Comment: (NOTE) SARS-CoV-2 target nucleic acids are NOT DETECTED.  The SARS-CoV-2 RNA is generally detectable in upper respiratory specimens during the acute phase of infection. The lowest concentration of SARS-CoV-2 viral copies this assay can detect is 138 copies/mL. A negative result does not  preclude SARS-Cov-2 infection and should not be used as the sole basis for treatment or other patient management decisions. A negative result may occur with  improper specimen collection/handling, submission of specimen other than nasopharyngeal swab, presence of viral mutation(s) within the areas targeted by this assay, and inadequate number of viral copies(<138 copies/mL). A negative result must be combined with clinical observations, patient history, and epidemiological information. The expected result is Negative.  Fact Sheet for Patients:  EntrepreneurPulse.com.au  Fact Sheet for Healthcare Providers:  IncredibleEmployment.be  This test is no t yet approved or cleared by the [place] FDA and  has been authorized for detection and/or diagnosis of SARS-CoV-2 by FDA under an Emergency Use Authorization (EUA). This EUA will remain  in effect (meaning this test can be used) for the  duration of the COVID-19 declaration under Section 564(b)(1) of the Act, 21 U.S.C.section 360bbb-3(b)(1), unless the authorization is terminated  or revoked sooner.       Influenza A by PCR NEGATIVE NEGATIVE Final   Influenza B by PCR NEGATIVE NEGATIVE Final    Comment: (NOTE) The Xpert Xpress SARS-CoV-2/FLU/RSV plus assay is intended as an aid in the diagnosis of influenza from Nasopharyngeal swab specimens and should not be used as a sole basis for treatment. Nasal washings and aspirates are unacceptable for Xpert Xpress SARS-CoV-2/FLU/RSV testing.  Fact Sheet for Patients: EntrepreneurPulse.com.au  Fact Sheet for Healthcare Providers: IncredibleEmployment.be  This test is not yet approved or cleared by the [place] FDA and has been authorized for detection and/or diagnosis of SARS-CoV-2 by FDA under an Emergency Use Authorization (EUA). This EUA will remain in effect (meaning this test can be used) for the  duration of the COVID-19 declaration under Section 564(b)(1) of the Act, 21 U.S.C. section 360bbb-3(b)(1), unless the authorization is terminated or revoked.  Performed at [facility], [address] [place], [place] [address]      Time coordinating discharge: Over 30 minutes  SIGNED:   Wyvonnia Dusky, MD  [facility] 11/17/2020, 1:49 PM Pager   If 7PM-7AM, please contact night-coverage

## 2020-11-17 NOTE — Plan of Care (Signed)

## 2020-11-17 NOTE — ED Notes (Signed)
Pt laying in bed, both side rails up, bed in lowest position, support person at bedside, no requests at this time, call bell within reach.

## 2020-11-17 NOTE — ED Notes (Signed)
Pt ambulated independently to bathroom, gait stable. Pt back in bed. Bed locked in lowest position, side rails up, tray and call bell within reach. Family at bedside.

## 2020-11-18 LAB — VITAMIN B1: Vitamin B1 (Thiamine): 176.6 nmol/L (ref 66.5–200.0)

## 2020-11-19 LAB — METHYLMALONIC ACID, SERUM: Methylmalonic Acid, Quantitative: 404 nmol/L — ABNORMAL HIGH (ref 0–378)

## 2020-11-22 ENCOUNTER — Encounter: Payer: Self-pay | Admitting: Family Medicine

## 2020-11-22 ENCOUNTER — Ambulatory Visit (INDEPENDENT_AMBULATORY_CARE_PROVIDER_SITE_OTHER): Payer: PPO | Admitting: Family Medicine

## 2020-11-22 ENCOUNTER — Other Ambulatory Visit: Payer: Self-pay

## 2020-11-22 VITALS — BP 150/100 | HR 70 | Temp 97.9°F | Resp 16 | Ht 75.0 in | Wt 195.0 lb

## 2020-11-22 DIAGNOSIS — I672 Cerebral atherosclerosis: Secondary | ICD-10-CM

## 2020-11-22 DIAGNOSIS — I6381 Other cerebral infarction due to occlusion or stenosis of small artery: Secondary | ICD-10-CM

## 2020-11-22 DIAGNOSIS — I10 Essential (primary) hypertension: Secondary | ICD-10-CM | POA: Diagnosis not present

## 2020-11-22 MED ORDER — HYDROCHLOROTHIAZIDE 12.5 MG PO CAPS: 12.5000 mg | ORAL_CAPSULE | Freq: Every day | ORAL | 1 refills | Status: DC

## 2020-11-22 NOTE — Progress Notes (Signed)
Subjective:    Patient ID: Matthew Luna, male    DOB: April 27, 1940, 80 y.o.   MRN: [id number]  Admit date: 11/15/2020 Discharge date: 11/17/2020   Admitted From: home  Disposition:  home    Recommendations for Outpatient Follow-up:  Follow up with PCP in 1 week F/u w/ neuro, Dr. Melrose Nakayama, in 2 weeks for neurocognitive testing    Home Health: no  Equipment/Devices:   Discharge Condition: stable  CODE STATUS: full  Diet recommendation: Heart Healthy    Brief/Interim Summary: HPI was taken from Dr. Francine Graven: ORDELL Luna is a 80 y.o. male with medical history significant for hypertension, dyslipidemia, history of CVA, intracranial atherosclerotic disease who presents to the ER by private vehicle accompanied by his wife for evaluation of acute on chronic headache. Patient states that he has had headache for about a month.  Headache is mostly over the frontal area and he rates it a 10 x 10 in intensity at its worst.  He was initially associated with dizziness but that has improved as well as balance issues.  He denies having any blurred vision, no nausea, no vomiting or light sensitivity.  He does wear glasses but has not had his eyes checked in a while. He was seen by his primary care provider about 2 weeks ago for same and he was told that his dizziness was due to cerebral hypoperfusion.  Per neurology permissive hypertension was recommended and the patient's blood pressure needed to be between 026 and 378 systolic with diastolic of 58-85.  His primary care provider his Bystolic that he was taking for hypertension. He denies having any chest pain, no shortness of breath, no palpitations, no diaphoresis, no leg swelling, no abdominal pain, no changes in his bowel habits, urinary symptoms, no fever, no chills, no cough, no focal deficits or blurred vision. Labs show sodium 141, potassium 3.9, chloride 103, BUN 25 calcium 9.5, alkaline phosphatase 68, albumin 4.3, AST 27, ALT 16, total protein  7.5 total bilirubin 0.9, white count 8.6, hemoglobin hematocrit 44.3, MCV 96.5, RDW 13.4, platelet count 194, PT 13.3, INR 1.0, CT scan of the head without contrast shows No evidence of acute intracranial abnormality.Known chronic small-vessel infarcts within the bilateral cerebral hemispheric white matter, basal ganglia, left thalamus and bilateral cerebellar hemispheres. Background mild-to-moderate chronic small vessel ischemic changes within the cerebral white matter.Generalized cerebral atrophy with superimposed disproportionate medial right temporal lobe volume loss. Minimal bilateral ethmoid sinus mucosal thickening. Trace fluid within the bilateral mastoid air cells. MRI/MRA of the brain shows no acute brain finding. Atrophy and chronic small-vessel ischemic changes as outlined above. No significant anterior circulation pathology. Severely diseased posterior cerebral arteries as described above. The appearance is unchanged compared to CT angiography of 05/19/2020. Twelve-lead EKG reviewed by me shows normal sinus rhythm, right bundle branch block with left anterior fascicular block   ED Course: Patient is a 80 year old male who presents to the ER for evaluation of worsening frontal headache over the last several days. His blood pressure was significantly elevated when he arrived to ER with systolic blood pressure of 027 mmHg and diastolic blood pressure of 120 mmHg. He received a dose of hydralazine 10 mg IV push, clonidine 0.1 mg as well as IV morphine and Dilaudid without any significant improvement in his blood pressure. He will be referred to observation status.     Hospital course from Dr. Jimmye Norman 11/17/20: By the time I saw the pt, the pt's headache had resolved and mental status  was back to baseline. TSH, FT4 were both elevated and B12 was on the low end of normal. Pt was started on B12 supplementation prior to d/c. B1 was pending at the time of d/c. PT evaluated the pt but did not  recommend any further therapy. Of note, pt should f/u outpatient w/ neuro to have neurocognitive testing. For more information, please see previous process/consult notes.   11/22/20 Unfortunately, the patient was admitted to the hospital with headache, hypertensive urgency, and confusion.  I do believe the patient likely has underlying vascular dementia that is extremely mild.  The patient's wife is here with him today and she to has noticed some mild memory impairment however this has been chronic and stable.  I believe that his exacerbation in the hospital was likely due to a combination of hypertensive urgency coupled with the pain medication that was given to him/polypharmacy due to his headache.  Since discharge from the hospital, he has resumed vitamin B12 supplementation along with taking his aspirin and rosuvastatin that he was already taking.  He is back on Bystolic.  His blood pressure at home has been variable.  Systolic blood pressure has been as low as 130 and as high as 171.  Diastolic blood pressures have varied between 90 and 110.  He has been on the medication now for approximately 4 days.  He denies any dizziness or chest pain or shortness of breath.  His mental status is back to his baseline.  He denies any headaches.  Past Medical History:  Diagnosis Date   AAA (abdominal aortic aneurysm)    Actinic keratosis    Allergy    mild    Anxiety    CKD (chronic kidney disease) stage 3, GFR 30-59 ml/min (HCC)    Diverticulosis of colon (without mention of hemorrhage)    Duodenitis without mention of hemorrhage    Esophageal reflux    Hypertrophy of prostate without urinary obstruction and other lower urinary tract symptoms (LUTS)    Left sided ulcerative (chronic) colitis (HCC)    Mononeuritis of unspecified site    Neuropathy    Nonspecific abnormal results of thyroid function study    Prostate cancer ([place])    cryotherapy 2011 ([facility])   Schatzki's ring    Sleep apnea    does not  use the CPAP   Stricture and stenosis of esophagus    Stroke (HCC)    left parietal, left parietal/occipital, right cerebellar, right pons   TIA (transient ischemic attack) 2017   pt states was told mini stroke   Unspecified essential hypertension    Past Surgical History:  Procedure Laterality Date   ABDOMINAL AORTIC ANEURYSM REPAIR     2018   BALLOON DILATION  03/22/2011   Procedure: BALLOON DILATION;  Surgeon: Inda Castle, MD;  Location: [facility];  Service: Endoscopy;  Laterality: N/A;  kelly/ebp   CHOLECYSTECTOMY     COLONOSCOPY     ENDOVASCULAR REPAIR/STENT GRAFT N/A 03/29/2016   Procedure: Endovascular Repair/Stent Graft;  Surgeon: Algernon Huxley, MD;  Location: [facility];  Service: Cardiovascular;  Laterality: N/A;   ESOPHAGOGASTRODUODENOSCOPY  03/22/2011   Procedure: ESOPHAGOGASTRODUODENOSCOPY (EGD);  Surgeon: Inda Castle, MD;  Location: [facility];  Service: Endoscopy;  Laterality: N/A;   PROSTATE SURGERY     UPPER GASTROINTESTINAL ENDOSCOPY     Current Outpatient Medications on File Prior to Visit  Medication Sig Dispense Refill   aspirin EC 81 MG tablet Take 81 mg by mouth  daily. Swallow whole.     nebivolol (BYSTOLIC) 10 MG tablet Take 1 tablet (10 mg total) by mouth daily. 30 tablet 0   rosuvastatin (CRESTOR) 40 MG tablet Take 1 tablet (40 mg total) by mouth daily. 90 tablet 3   [START ON 11/24/2020] vitamin B-12 1000 MCG tablet Take 1 tablet (1,000 mcg total) by mouth daily. 30 tablet 0   No current facility-administered medications on file prior to visit.   Allergies  Allergen Reactions   Metronidazole Anaphylaxis   Amoxicillin-Pot Clavulanate Other (See Comments)    UNKNOWN   Ciprofloxacin Other (See Comments)    UNKNOWN   Clindamycin/Lincomycin Other (See Comments)    Nausea, stomach pain, sweats   Mesalamine     REACTION: Intolerance   Social History   Socioeconomic History   Marital status: Married    Spouse name: Not on file    Number of children: 1   Years of education: Some colg   Highest education level: Not on file  Occupational History   Occupation: Retired   Tobacco Use   Smoking status: Never   Smokeless tobacco: Never  Vaping Use   Vaping Use: Never used  Substance and Sexual Activity   Alcohol use: Yes    Alcohol/week: 14.0 standard drinks    Types: 14 Glasses of wine per week    Comment: per pt -drinks 10 oz red wine every night!   Drug use: No   Sexual activity: Never  Other Topics Concern   Not on file  Social History Narrative   1 -2 cups of coffee a day, some tea consumption    Left handed   Lives with wife   Social Determinants of Health   Financial Resource Strain: Not on file  Food Insecurity: Not on file  Transportation Needs: Not on file  Physical Activity: Not on file  Stress: Not on file  Social Connections: Not on file  Intimate Partner Violence: Not on file      Review of Systems  All other systems reviewed and are negative.     Objective:   Physical Exam Vitals reviewed.  Constitutional:      General: He is not in acute distress.    Appearance: He is well-developed. He is not diaphoretic.  Neck:     Thyroid: No thyromegaly.     Vascular: No JVD.  Cardiovascular:     Rate and Rhythm: Normal rate and regular rhythm.     Heart sounds: Normal heart sounds. No murmur heard.   No friction rub. No gallop.  Pulmonary:     Effort: Pulmonary effort is normal. No respiratory distress.     Breath sounds: Normal breath sounds. No wheezing or rales.  Lymphadenopathy:     Cervical: No cervical adenopathy.  Neurological:     Mental Status: He is alert and oriented to person, place, and time.     Motor: Motor function is intact. No tremor, atrophy, abnormal muscle tone or pronator drift.     Coordination: Coordination is intact.          Assessment & Plan:  Cerebrovascular accident (CVA) due to stenosis of small artery ([place])  Intracranial  atherosclerosis  Benign essential HTN I would like the patient to continue taking Bystolic.  Recheck via telephone on Friday with his blood pressures.  If blood pressures remain elevated, at that time we will add hydrochlorothiazide.  I would like to set his goal systolic blood pressure to range between 140 and 150/80-90.  If the blood pressure is above that goal by Friday we will add hydrochlorothiazide 12.5 mg daily and recheck his blood pressure in 1 week after that.  Continue aspirin along with rosuvastatin and vitamin B12 1000 mcg daily.  Recheck blood pressure later this week.  Repeat tsh in 1 week.

## 2020-12-15 ENCOUNTER — Other Ambulatory Visit: Payer: Self-pay | Admitting: Family Medicine

## 2020-12-29 ENCOUNTER — Other Ambulatory Visit: Payer: Self-pay | Admitting: *Deleted

## 2020-12-29 MED ORDER — HYDROCHLOROTHIAZIDE 12.5 MG PO CAPS
ORAL_CAPSULE | ORAL | 3 refills | Status: DC
Start: 2020-12-29 — End: 2023-07-28

## 2021-02-03 ENCOUNTER — Other Ambulatory Visit: Payer: Self-pay

## 2021-02-03 ENCOUNTER — Encounter: Payer: Self-pay | Admitting: Family Medicine

## 2021-02-03 ENCOUNTER — Ambulatory Visit (INDEPENDENT_AMBULATORY_CARE_PROVIDER_SITE_OTHER): Payer: PPO

## 2021-02-03 ENCOUNTER — Ambulatory Visit (INDEPENDENT_AMBULATORY_CARE_PROVIDER_SITE_OTHER): Payer: PPO | Admitting: Family Medicine

## 2021-02-03 VITALS — BP 146/84 | HR 60 | Ht 75.0 in | Wt 202.0 lb

## 2021-02-03 VITALS — BP 152/86 | HR 59 | Ht 75.0 in | Wt 195.0 lb

## 2021-02-03 DIAGNOSIS — I10 Essential (primary) hypertension: Secondary | ICD-10-CM

## 2021-02-03 DIAGNOSIS — I6930 Unspecified sequelae of cerebral infarction: Secondary | ICD-10-CM | POA: Diagnosis not present

## 2021-02-03 DIAGNOSIS — Z0000 Encounter for general adult medical examination without abnormal findings: Secondary | ICD-10-CM | POA: Diagnosis not present

## 2021-02-03 DIAGNOSIS — I6381 Other cerebral infarction due to occlusion or stenosis of small artery: Secondary | ICD-10-CM

## 2021-02-03 DIAGNOSIS — N1832 Chronic kidney disease, stage 3b: Secondary | ICD-10-CM

## 2021-02-03 DIAGNOSIS — I672 Cerebral atherosclerosis: Secondary | ICD-10-CM | POA: Diagnosis not present

## 2021-02-03 LAB — LIPID PANEL
Cholesterol: 132 mg/dL (ref <200)
HDL: 40 mg/dL (ref >=40)
LDL Cholesterol (Calc): 71 mg/dL (calc)
Non-HDL Cholesterol (Calc): 92 mg/dL (calc) (ref <130)
Total CHOL/HDL Ratio: 3.3 (calc) (ref <5.0)
Triglycerides: 131 mg/dL (ref <150)

## 2021-02-03 LAB — COMPLETE METABOLIC PANEL WITH GFR
AG Ratio: 1.5 (calc) (ref 1.0–2.5)
ALT: 8 U/L — ABNORMAL LOW (ref 9–46)
AST: 11 U/L (ref 10–35)
Albumin: 4 g/dL (ref 3.6–5.1)
Alkaline phosphatase (APISO): 88 U/L (ref 35–144)
BUN/Creatinine Ratio: 14 (calc) (ref 6–22)
BUN: 28 mg/dL — ABNORMAL HIGH (ref 7–25)
CO2: 23 mmol/L (ref 20–32)
Calcium: 9.6 mg/dL (ref 8.6–10.3)
Chloride: 109 mmol/L (ref 98–110)
Creat: 1.95 mg/dL — ABNORMAL HIGH (ref 0.70–1.28)
Globulin: 2.7 g/dL (calc) (ref 1.9–3.7)
Glucose, Bld: 86 mg/dL (ref 65–99)
Potassium: 4.8 mmol/L (ref 3.5–5.3)
Sodium: 141 mmol/L (ref 135–146)
Total Bilirubin: 0.7 mg/dL (ref 0.2–1.2)
Total Protein: 6.7 g/dL (ref 6.1–8.1)
eGFR: 34 mL/min/{1.73_m2} — ABNORMAL LOW (ref >=60)

## 2021-02-03 LAB — CBC WITH DIFFERENTIAL/PLATELET
Absolute Monocytes: 557 cells/uL (ref 200–950)
Basophils Absolute: 106 cells/uL (ref 0–200)
Basophils Relative: 1.1 %
Eosinophils Absolute: 1094 cells/uL — ABNORMAL HIGH (ref 15–500)
Eosinophils Relative: 11.4 %
HCT: 42.9 % (ref 38.5–50.0)
Hemoglobin: 14.2 g/dL (ref 13.2–17.1)
Lymphs Abs: 2496 cells/uL (ref 850–3900)
MCH: 32.4 pg (ref 27.0–33.0)
MCHC: 33.1 g/dL (ref 32.0–36.0)
MCV: 97.9 fL (ref 80.0–100.0)
MPV: 10.3 fL (ref 7.5–12.5)
Monocytes Relative: 5.8 %
Neutro Abs: 5347 cells/uL (ref 1500–7800)
Neutrophils Relative %: 55.7 %
Platelets: 226 10*3/uL (ref 140–400)
RBC: 4.38 10*6/uL (ref 4.20–5.80)
RDW: 13.2 % (ref 11.0–15.0)
Total Lymphocyte: 26 %
WBC: 9.6 10*3/uL (ref 3.8–10.8)

## 2021-02-03 NOTE — Patient Instructions (Signed)
[name] , Thank you for taking time to come for your Medicare Wellness Visit. I appreciate your ongoing commitment to your health goals. Please review the following plan we discussed and let me know if I can assist you in the future.   Screening recommendations/referrals: Colonoscopy: No longer required.   Recommended yearly ophthalmology/optometry visit for glaucoma screening and checkup Recommended yearly dental visit for hygiene and checkup  Vaccinations: Influenza vaccine: Due. Repeat annually  Pneumococcal vaccine: Done 08/18/2014 and 03/22/2007 Tdap vaccine: Due Repeat in 10 years  Shingles vaccine: Shingrix discussed. Please contact your pharmacy for coverage information.     Covid-19: Done 03/26/2019 and 04/23/2019.  Advanced directives: Please bring a copy of your health care power of attorney and living will to the office to be added to your chart at your convenience.   Conditions/risks identified: Aim for 30 minutes of exercise or brisk walking each day, drink 6-8 glasses of water and eat lots of fruits and vegetables.   Next appointment: Follow up in one year for your annual wellness visit. 2024  Preventive Care 65 Years and Older, Male  Preventive care refers to lifestyle choices and visits with your health care provider that can promote health and wellness. What does preventive care include? A yearly physical exam. This is also called an annual well check. Dental exams once or twice a year. Routine eye exams. Ask your health care provider how often you should have your eyes checked. Personal lifestyle choices, including: Daily care of your teeth and gums. Regular physical activity. Eating a healthy diet. Avoiding tobacco and drug use. Limiting alcohol use. Practicing safe sex. Taking low doses of aspirin every day. Taking vitamin and mineral supplements as recommended by your health care provider. What happens during an annual well check? The services and  screenings done by your health care provider during your annual well check will depend on your age, overall health, lifestyle risk factors, and family history of disease. Counseling  Your health care provider may ask you questions about your: Alcohol use. Tobacco use. Drug use. Emotional well-being. Home and relationship well-being. Sexual activity. Eating habits. History of falls. Memory and ability to understand (cognition). Work and work Statistician. Screening  You may have the following tests or measurements: Height, weight, and BMI. Blood pressure. Lipid and cholesterol levels. These may be checked every 5 years, or more frequently if you are over 35 years old. Skin check. Lung cancer screening. You may have this screening every year starting at age 94 if you have a 30-pack-year history of smoking and currently smoke or have quit within the past 15 years. Fecal occult blood test (FOBT) of the stool. You may have this test every year starting at age 16. Flexible sigmoidoscopy or colonoscopy. You may have a sigmoidoscopy every 5 years or a colonoscopy every 10 years starting at age 43. Prostate cancer screening. Recommendations will vary depending on your family history and other risks. Hepatitis C blood test. Hepatitis B blood test. Sexually transmitted disease (STD) testing. Diabetes screening. This is done by checking your blood sugar (glucose) after you have not eaten for a while (fasting). You may have this done every 1-3 years. Abdominal aortic aneurysm (AAA) screening. You may need this if you are a current or former smoker. Osteoporosis. You may be screened starting at age 69 if you are at high risk. Talk with your health care provider about your test results, treatment options, and if necessary, the need for more tests. Vaccines  Your health care provider may recommend certain vaccines, such as: Influenza vaccine. This is recommended every year. Tetanus, diphtheria, and  acellular pertussis (Tdap, Td) vaccine. You may need a Td booster every 10 years. Zoster vaccine. You may need this after age 61. Pneumococcal 13-valent conjugate (PCV13) vaccine. One dose is recommended after age 42. Pneumococcal polysaccharide (PPSV23) vaccine. One dose is recommended after age 41. Talk to your health care provider about which screenings and vaccines you need and how often you need them. This information is not intended to replace advice given to you by your health care provider. Make sure you discuss any questions you have with your health care provider. Document Released: 02/12/2015 Document Revised: 10/06/2015 Document Reviewed: 11/17/2014 Elsevier Interactive Patient Education  2017 [place] Prevention in the Home Falls can cause injuries. They can happen to people of all ages. There are many things you can do to make your home safe and to help prevent falls. What can I do on the outside of my home? Regularly fix the edges of walkways and driveways and fix any cracks. Remove anything that might make you trip as you walk through a door, such as a raised step or threshold. Trim any bushes or trees on the path to your home. Use bright outdoor lighting. Clear any walking paths of anything that might make someone trip, such as rocks or tools. Regularly check to see if handrails are loose or broken. Make sure that both sides of any steps have handrails. Any raised decks and porches should have guardrails on the edges. Have any leaves, snow, or ice cleared regularly. Use sand or salt on walking paths during winter. Clean up any spills in your garage right away. This includes oil or grease spills. What can I do in the bathroom? Use night lights. Install grab bars by the toilet and in the tub and shower. Do not use towel bars as grab bars. Use non-skid mats or decals in the tub or shower. If you need to sit down in the shower, use a plastic, non-slip stool. Keep  the floor dry. Clean up any water that spills on the floor as soon as it happens. Remove soap buildup in the tub or shower regularly. Attach bath mats securely with double-sided non-slip rug tape. Do not have throw rugs and other things on the floor that can make you trip. What can I do in the bedroom? Use night lights. Make sure that you have a light by your bed that is easy to reach. Do not use any sheets or blankets that are too big for your bed. They should not hang down onto the floor. Have a firm chair that has side arms. You can use this for support while you get dressed. Do not have throw rugs and other things on the floor that can make you trip. What can I do in the kitchen? Clean up any spills right away. Avoid walking on wet floors. Keep items that you use a lot in easy-to-reach places. If you need to reach something above you, use a strong step stool that has a grab bar. Keep electrical cords out of the way. Do not use floor polish or wax that makes floors slippery. If you must use wax, use non-skid floor wax. Do not have throw rugs and other things on the floor that can make you trip. What can I do with my stairs? Do not leave any items on the stairs. Make sure that there are  handrails on both sides of the stairs and use them. Fix handrails that are broken or loose. Make sure that handrails are as long as the stairways. Check any carpeting to make sure that it is firmly attached to the stairs. Fix any carpet that is loose or worn. Avoid having throw rugs at the top or bottom of the stairs. If you do have throw rugs, attach them to the floor with carpet tape. Make sure that you have a light switch at the top of the stairs and the bottom of the stairs. If you do not have them, ask someone to add them for you. What else can I do to help prevent falls? Wear shoes that: Do not have high heels. Have rubber bottoms. Are comfortable and fit you well. Are closed at the toe. Do not  wear sandals. If you use a stepladder: Make sure that it is fully opened. Do not climb a closed stepladder. Make sure that both sides of the stepladder are locked into place. Ask someone to hold it for you, if possible. Clearly mark and make sure that you can see: Any grab bars or handrails. First and last steps. Where the edge of each step is. Use tools that help you move around (mobility aids) if they are needed. These include: Canes. Walkers. Scooters. Crutches. Turn on the lights when you go into a dark area. Replace any light bulbs as soon as they burn out. Set up your furniture so you have a clear path. Avoid moving your furniture around. If any of your floors are uneven, fix them. If there are any pets around you, be aware of where they are. Review your medicines with your doctor. Some medicines can make you feel dizzy. This can increase your chance of falling. Ask your doctor what other things that you can do to help prevent falls. This information is not intended to replace advice given to you by your health care provider. Make sure you discuss any questions you have with your health care provider. Document Released: 11/12/2008 Document Revised: 06/24/2015 Document Reviewed: 02/20/2014 Elsevier Interactive Patient Education  2017 [employer].

## 2021-02-03 NOTE — Progress Notes (Signed)
Subjective:    Patient ID: Matthew Luna, male    DOB: 10-09-1940, 81 y.o.   MRN: [id number]  Patient states that his hands have been feeling cold for over a month.  On examination today he has palpable radial and ulnar pulses bilaterally.  He has normal capillary refill.  However he states at times, his hands are ice cold to the thumb.  He denies any purple or blue or white fingertips.  However the cold sensation typically occurs when he is cold outside suggesting possible peripheral vasoconstriction/nodes but normal none.  He denies any numbness or tingling or burning in his hands.  He denies any muscle weakness in his hands.  His blood pressure today is acceptable at 146/84.  He has a history of dizziness due to atherosclerosis in his cerebral blood vessel and neurology has recommended permissive hypertension with a systolic blood pressure near 150.  He is compliant taking his aspirin and Crestor.  His goal LDL is less than 70 and he is due to recheck his cholesterol.  He denies any dizziness today.   Past Medical History:  Diagnosis Date   AAA (abdominal aortic aneurysm)    Actinic keratosis    Allergy    mild    Anxiety    CKD (chronic kidney disease) stage 3, GFR 30-59 ml/min (HCC)    Diverticulosis of colon (without mention of hemorrhage)    Duodenitis without mention of hemorrhage    Esophageal reflux    Hypertrophy of prostate without urinary obstruction and other lower urinary tract symptoms (LUTS)    Left sided ulcerative (chronic) colitis (HCC)    Mononeuritis of unspecified site    Neuropathy    Nonspecific abnormal results of thyroid function study    Prostate cancer ([place])    cryotherapy 2011 ([facility])   Schatzki's ring    Sleep apnea    does not use the CPAP   Stricture and stenosis of esophagus    Stroke (HCC)    left parietal, left parietal/occipital, right cerebellar, right pons   TIA (transient ischemic attack) 2017   pt states was told mini stroke   Unspecified  essential hypertension    Past Surgical History:  Procedure Laterality Date   ABDOMINAL AORTIC ANEURYSM REPAIR     2018   BALLOON DILATION  03/22/2011   Procedure: BALLOON DILATION;  Surgeon: Inda Castle, MD;  Location: [facility];  Service: Endoscopy;  Laterality: N/A;  kelly/ebp   CHOLECYSTECTOMY     COLONOSCOPY     ENDOVASCULAR REPAIR/STENT GRAFT N/A 03/29/2016   Procedure: Endovascular Repair/Stent Graft;  Surgeon: Algernon Huxley, MD;  Location: [place] [facility];  Service: Cardiovascular;  Laterality: N/A;   ESOPHAGOGASTRODUODENOSCOPY  03/22/2011   Procedure: ESOPHAGOGASTRODUODENOSCOPY (EGD);  Surgeon: Inda Castle, MD;  Location: [facility];  Service: Endoscopy;  Laterality: N/A;   PROSTATE SURGERY     UPPER GASTROINTESTINAL ENDOSCOPY     Current Outpatient Medications on File Prior to Visit  Medication Sig Dispense Refill   aspirin EC 81 MG tablet Take 81 mg by mouth daily. Swallow whole.     rosuvastatin (CRESTOR) 40 MG tablet Take 1 tablet (40 mg total) by mouth daily. 90 tablet 3   hydrochlorothiazide (MICROZIDE) 12.5 MG capsule TAKE 1 CAPSULE BY MOUTH EVERY DAY (Patient not taking: Reported on 02/03/2021) 90 capsule 3   nebivolol (BYSTOLIC) 10 MG tablet Take 1 tablet (10 mg total) by mouth daily. 30 tablet 0   No current facility-administered  medications on file prior to visit.   Allergies  Allergen Reactions   Metronidazole Anaphylaxis   Amoxicillin-Pot Clavulanate Other (See Comments)    UNKNOWN   Ciprofloxacin Other (See Comments)    UNKNOWN   Clindamycin/Lincomycin Other (See Comments)    Nausea, stomach pain, sweats   Mesalamine     REACTION: Intolerance   Social History   Socioeconomic History   Marital status: Married    Spouse name: Not on file   Number of children: 1   Years of education: Some colg   Highest education level: Not on file  Occupational History   Occupation: Retired   Tobacco Use   Smoking status: Never   Smokeless tobacco:  Never  Vaping Use   Vaping Use: Never used  Substance and Sexual Activity   Alcohol use: Yes    Alcohol/week: 14.0 standard drinks    Types: 14 Glasses of wine per week    Comment: per pt -drinks 10 oz red wine every night!   Drug use: No   Sexual activity: Never  Other Topics Concern   Not on file  Social History Narrative   1 -2 cups of coffee a day, some tea consumption    Left handed   Lives with wife   Social Determinants of Health   Financial Resource Strain: Low Risk    Difficulty of Paying Living Expenses: Not hard at all  Food Insecurity: No Food Insecurity   Worried About Charity fundraiser in the Last Year: Never true   [place] in the Last Year: Never true  Transportation Needs: No Transportation Needs   Lack of Transportation (Medical): No   Lack of Transportation (Non-Medical): No  Physical Activity: Inactive   Days of Exercise per Week: 0 days   Minutes of Exercise per Session: 0 min  Stress: No Stress Concern Present   Feeling of Stress : Not at all  Social Connections: Moderately Isolated   Frequency of Communication with Friends and Family: More than three times a week   Frequency of Social Gatherings with Friends and Family: More than three times a week   Attends Religious Services: Never   Marine scientist or Organizations: No   Attends Music therapist: Never   Marital Status: Married  Human resources officer Violence: Not At Risk   Fear of Current or Ex-Partner: No   Emotionally Abused: No   Physically Abused: No   Sexually Abused: No      Review of Systems  All other systems reviewed and are negative.     Objective:   Physical Exam Vitals reviewed.  Constitutional:      General: He is not in acute distress.    Appearance: He is well-developed. He is not diaphoretic.  Neck:     Thyroid: No thyromegaly.     Vascular: No JVD.  Cardiovascular:     Rate and Rhythm: Normal rate and regular rhythm.     Heart sounds:  Normal heart sounds. No murmur heard.   No friction rub. No gallop.  Pulmonary:     Effort: Pulmonary effort is normal. No respiratory distress.     Breath sounds: Normal breath sounds. No wheezing or rales.  Lymphadenopathy:     Cervical: No cervical adenopathy.  Neurological:     Mental Status: He is alert and oriented to person, place, and time.     Motor: Motor function is intact. No tremor, atrophy, abnormal muscle tone or pronator drift.  Coordination: Coordination is intact.          Assessment & Plan:  Benign essential HTN - Plan: CBC with Differential/Platelet, COMPLETE METABOLIC PANEL WITH GFR, Lipid panel  Intracranial atherosclerosis  Cerebrovascular accident (CVA) due to stenosis of small artery (HCC)  Stage 3b chronic kidney disease ([place]) Blood pressure is acceptable.  Continue hydrochlorothiazide in addition to Bystolic.  Check CBC CMP and lipid panel.  Goal LDL cholesterol of less than 70.  Continue aspirin for secondary prevention of stroke.  Maintain permissive hypertension with a blood pressure between 140 and 150 due to high-grade stenosis in the posterior cerebral artery as recommended by neurology.  I believe that the "cold" may be Raynaud's phenomenon.  However at the present time, no treatment is necessary

## 2021-02-03 NOTE — Progress Notes (Signed)
Subjective:   Matthew Luna is a 81 y.o. male who presents for an Initial Medicare Annual Wellness Visit.  Review of Systems     Cardiac Risk Factors include: hypertension;dyslipidemia;advanced age (>83men, >21 women);sedentary lifestyle;male gender  IN OFFICE VISIT AT [facility].    Objective:    Today's Vitals   02/03/21 1118  BP: (!) 152/86  Pulse: (!) 59  SpO2: 100%  Weight: 195 lb (88.5 kg)  Height: 6\' 3"  (1.905 m)   Body mass index is 24.37 kg/m.  Advanced Directives 02/03/2021 11/17/2020 11/15/2020 05/04/2020 11/18/2019 07/25/2017 07/11/2016  Does Patient Have a Medical Advance Directive? Yes - No No No Yes Yes  Type of Paramedic of [place];Living will - - - - Living will [place];Living will  Does patient want to make changes to medical advance directive? - - - - - - -  Copy of [place] in Chart? No - copy requested - - - - - -  Would patient like information on creating a medical advance directive? - No - Patient declined - - - - -  Pre-existing out of facility DNR order (yellow form or pink MOST form) - - - - - - -    Current Medications (verified) Outpatient Encounter Medications as of 02/03/2021  Medication Sig   aspirin EC 81 MG tablet Take 81 mg by mouth daily. Swallow whole.   rosuvastatin (CRESTOR) 40 MG tablet Take 1 tablet (40 mg total) by mouth daily.   hydrochlorothiazide (MICROZIDE) 12.5 MG capsule TAKE 1 CAPSULE BY MOUTH EVERY DAY (Patient not taking: Reported on 02/03/2021)   nebivolol (BYSTOLIC) 10 MG tablet Take 1 tablet (10 mg total) by mouth daily.   No facility-administered encounter medications on file as of 02/03/2021.    Allergies (verified) Metronidazole, Amoxicillin-pot clavulanate, Ciprofloxacin, Clindamycin/lincomycin, and Mesalamine   History: Past Medical History:  Diagnosis Date   AAA (abdominal aortic aneurysm)    Actinic keratosis    Allergy    mild    Anxiety    CKD  (chronic kidney disease) stage 3, GFR 30-59 ml/min (HCC)    Diverticulosis of colon (without mention of hemorrhage)    Duodenitis without mention of hemorrhage    Esophageal reflux    Hypertrophy of prostate without urinary obstruction and other lower urinary tract symptoms (LUTS)    Left sided ulcerative (chronic) colitis (HCC)    Mononeuritis of unspecified site    Neuropathy    Nonspecific abnormal results of thyroid function study    Prostate cancer ([place])    cryotherapy 2011 ([facility])   Schatzki's ring    Sleep apnea    does not use the CPAP   Stricture and stenosis of esophagus    Stroke (HCC)    left parietal, left parietal/occipital, right cerebellar, right pons   TIA (transient ischemic attack) 2017   pt states was told mini stroke   Unspecified essential hypertension    Past Surgical History:  Procedure Laterality Date   ABDOMINAL AORTIC ANEURYSM REPAIR     2018   BALLOON DILATION  03/22/2011   Procedure: BALLOON DILATION;  Surgeon: Inda Castle, MD;  [facility];  Service: Endoscopy;  Laterality: N/A;  kelly/ebp   CHOLECYSTECTOMY     COLONOSCOPY     ENDOVASCULAR REPAIR/STENT GRAFT N/A 03/29/2016   Procedure: Endovascular Repair/Stent Graft;  Surgeon: Algernon Huxley, MD;  Location: [place] [facility];  Service: Cardiovascular;  Laterality: N/A;   ESOPHAGOGASTRODUODENOSCOPY  03/22/2011   Procedure: ESOPHAGOGASTRODUODENOSCOPY (EGD);  Surgeon: Inda Castle, MD;  Location: [facility];  Service: Endoscopy;  Laterality: N/A;   PROSTATE SURGERY     UPPER GASTROINTESTINAL ENDOSCOPY     Family History  Problem Relation Age of Onset   Cancer Mother        unknown type   Snoring Father    Anesthesia problems Neg Hx    Hypotension Neg Hx    Malignant hyperthermia Neg Hx    Pseudochol deficiency Neg Hx    Colon cancer Neg Hx    Colon polyps Neg Hx    Esophageal cancer Neg Hx    Stomach cancer Neg Hx    Rectal cancer Neg Hx    Social History   Socioeconomic  History   Marital status: Married    Spouse name: Not on file   Number of children: 1   Years of education: Some colg   Highest education level: Not on file  Occupational History   Occupation: Retired   Tobacco Use   Smoking status: Never   Smokeless tobacco: Never  Vaping Use   Vaping Use: Never used  Substance and Sexual Activity   Alcohol use: Yes    Alcohol/week: 14.0 standard drinks    Types: 14 Glasses of wine per week    Comment: per pt -drinks 10 oz red wine every night!   Drug use: No   Sexual activity: Never  Other Topics Concern   Not on file  Social History Narrative   1 -2 cups of coffee a day, some tea consumption    Left handed   Lives with wife. Married x 59 years in 2022.   Truck driver.   Social Determinants of Health   Financial Resource Strain: Low Risk    Difficulty of Paying Living Expenses: Not hard at all  Food Insecurity: No Food Insecurity   Worried About Charity fundraiser in the Last Year: Never true   [place] in the Last Year: Never true  Transportation Needs: No Transportation Needs   Lack of Transportation (Medical): No   Lack of Transportation (Non-Medical): No  Physical Activity: Inactive   Days of Exercise per Week: 0 days   Minutes of Exercise per Session: 0 min  Stress: No Stress Concern Present   Feeling of Stress : Not at all  Social Connections: Moderately Isolated   Frequency of Communication with Friends and Family: More than three times a week   Frequency of Social Gatherings with Friends and Family: More than three times a week   Attends Religious Services: Never   Marine scientist or Organizations: No   Attends Music therapist: Never   Marital Status: Married    Tobacco Counseling Counseling given: Not Answered   Clinical Intake:  Pre-visit preparation completed: Yes  Pain : No/denies pain     BMI - recorded: 24.37 Nutritional Status: BMI of 19-24  Normal Nutritional Risks:  None Diabetes: No  How often do you need to have someone help you when you read instructions, pamphlets, or other written materials from your doctor or pharmacy?: 1 - Never  Diabetic?NO  Interpreter Needed?: No  Information entered by :: MJ Georgio Hattabaugh, LPN   Activities of Daily Living In your present state of health, do you have any difficulty performing the following activities: 02/03/2021 11/17/2020  Hearing? N N  Vision? N N  Difficulty concentrating or making decisions? N N  Walking or climbing  stairs? N N  Dressing or bathing? N N  Doing errands, shopping? N N  Preparing Food and eating ? N -  Using the Toilet? N -  In the past six months, have you accidently leaked urine? N -  Do you have problems with loss of bowel control? N -  Managing your Medications? N -  Managing your Finances? N -  Housekeeping or managing your Housekeeping? N -  Some recent data might be hidden    Patient Care Team: Susy Frizzle, MD as PCP - General (Family Medicine) Pieter Partridge, DO as Consulting Physician (Neurology) Kassie Mends, RN as [place] any recent [place] you may have received from other than [facility] providers in the past year (date may be approximate).     Assessment:   This is a routine wellness examination for Katherine.  Hearing/Vision screen Hearing Screening - Comments:: Some hearing issues.  Vision Screening - Comments:: Readers. Encouraged pt to make appt for eye exam this year.  Dietary issues and exercise activities discussed: Current Exercise Habits: The patient does not participate in regular exercise at present, Exercise limited by: cardiac condition(s);orthopedic condition(s)   Goals Addressed             This Visit's Progress    Exercise 3x per week (30 min per time)       Encouraged pt to increase exercise.        Depression Screen PHQ 2/9 Scores 02/03/2021 06/05/2019 10/15/2017 11/27/2016 10/30/2016  05/13/2015  PHQ - 2 Score 1 0 0 0 0 0  PHQ- 9 Score - - - - 0 0    Fall Risk Fall Risk  02/03/2021 05/04/2020 11/18/2019 11/03/2019 08/26/2019  Falls in the past year? 1 0 1 0 0  Comment - - - - Emmi Telephone Survey: data to providers prior to load  Number falls in past yr: 0 0 1 0 -  Injury with Fall? 1 0 0 0 -  Risk for fall due to : History of fall(s);Impaired balance/gait;Impaired mobility - - - -  Follow up - - - Falls evaluation completed -    FALL RISK PREVENTION PERTAINING TO THE HOME:  Any stairs in or around the home? No  If so, are there any without handrails? No  Home free of loose throw rugs in walkways, pet beds, electrical cords, etc? Yes  Adequate lighting in your home to reduce risk of falls? Yes   ASSISTIVE DEVICES UTILIZED TO PREVENT FALLS:  Life alert? No  Use of a cane, walker or w/c? No  Grab bars in the bathroom? Yes  Shower chair or bench in shower? Yes  Elevated toilet seat or a handicapped toilet? Yes   TIMED UP AND GO:  Was the test performed? Yes .  Length of time to ambulate 10 feet: 10 sec.   Gait steady and fast without use of assistive device  Cognitive Function:     6CIT Screen 02/03/2021  What Year? 0 points  What month? 0 points  What time? 0 points  Count back from 20 0 points  Months in reverse 2 points  Repeat phrase 0 points  Total Score 2    Immunizations Immunization History  Administered Date(s) Administered   Influenza Whole 10/30/2004   Influenza, High Dose Seasonal PF 10/03/2016   Influenza,inj,Quad PF,6+ Mos 12/05/2013, 11/19/2014, 10/15/2017   Influenza-Unspecified 10/23/2016   Moderna Sars-Covid-2 Vaccination 03/26/2019, 04/23/2019   Pneumococcal Conjugate-13 08/18/2014  Pneumococcal Polysaccharide-23 03/22/2007    TDAP status: Due, Education has been provided regarding the importance of this vaccine. Advised may receive this vaccine at local pharmacy or Health Dept. Aware to provide a copy of the vaccination record  if obtained from local pharmacy or Health Dept. Verbalized acceptance and understanding.  Flu Vaccine status: Due, Education has been provided regarding the importance of this vaccine. Advised may receive this vaccine at local pharmacy or Health Dept. Aware to provide a copy of the vaccination record if obtained from local pharmacy or Health Dept. Verbalized acceptance and understanding.  Pneumococcal vaccine status: Up to date  Covid-19 vaccine status: Completed vaccines  Qualifies for Shingles Vaccine? Yes   Zostavax completed No   Shingrix Completed?: No.    Education has been provided regarding the importance of this vaccine. Patient has been advised to call insurance company to determine out of pocket expense if they have not yet received this vaccine. Advised may also receive vaccine at local pharmacy or Health Dept. Verbalized acceptance and understanding.  Screening Tests Health Maintenance  Topic Date Due   Hepatitis C Screening  Never done   Zoster Vaccines- Shingrix (1 of 2) Never done   COVID-19 Vaccine (3 - Moderna risk series) 05/21/2019   INFLUENZA VACCINE  08/30/2020   TETANUS/TDAP  03/09/2021 (Originally 09/17/1959)   Pneumonia Vaccine 73+ Years old  Completed   HPV VACCINES  Aged Out    Health Maintenance  Health Maintenance Due  Topic Date Due   Hepatitis C Screening  Never done   Zoster Vaccines- Shingrix (1 of 2) Never done   COVID-19 Vaccine (3 - Moderna risk series) 05/21/2019   INFLUENZA VACCINE  08/30/2020    Colorectal cancer screening: No longer required.   Lung Cancer Screening: (Low Dose CT Chest recommended if Age 59-80 years, 30 pack-year currently smoking OR have quit w/in 15years.) does not qualify.    Additional Screening:  Hepatitis C Screening: does not qualify.  Vision Screening: Recommended annual ophthalmology exams for early detection of glaucoma and other disorders of the eye. Is the patient up to date with their annual eye exam?  No   Who is the provider or what is the name of the office in which the patient attends annual eye exams? N/A If pt is not established with a provider, would they like to be referred to a provider to establish care? No .   Dental Screening: Recommended annual dental exams for proper oral hygiene  Community Resource Referral / Chronic Care Management: CRR required this visit?  No   CCM required this visit?  No      Plan:     I have personally reviewed and noted the following in the patients chart:   Medical and social history Use of alcohol, tobacco or illicit drugs  Current medications and supplements including opioid prescriptions. Patient is not currently taking opioid prescriptions. Functional ability and status Nutritional status Physical activity Advanced directives List of other physicians Hospitalizations, surgeries, and ER visits in previous 12 months Vitals Screenings to include cognitive, depression, and falls Referrals and appointments  In addition, I have reviewed and discussed with patient certain preventive protocols, quality metrics, and best practice recommendations. A written personalized care plan for preventive services as well as general preventive health recommendations were provided to patient.     Chriss Driver, LPN   09/05/6809   Nurse Notes: Discussed flu, shingles and tdap vaccines and how to obtain. Pt overdue for eye  exam. Offered to make referral but patient declined. Encouraged pt to make appointment in the next few months. Pt verbalized understanding.

## 2021-02-04 ENCOUNTER — Telehealth: Payer: Self-pay

## 2021-02-04 NOTE — Telephone Encounter (Signed)
Informed patient of results and recommendations. 

## 2021-02-04 NOTE — Telephone Encounter (Signed)
-----   Message from Susy Frizzle, MD sent at 02/04/2021  6:46 AM EST ----- Cholesterol is excellent and kidney fcn is stable. No change at this time, recheck in 4 months.

## 2021-04-28 ENCOUNTER — Other Ambulatory Visit: Payer: Self-pay | Admitting: Family Medicine

## 2021-05-02 ENCOUNTER — Other Ambulatory Visit: Payer: Self-pay | Admitting: Family Medicine

## 2021-05-04 NOTE — Telephone Encounter (Signed)
Pt is not currently on the following meds since 02/26/20 ?GABAPENTIN 300 MG CAPSULE   ?Please advise ?

## 2021-05-05 MED ORDER — GABAPENTIN 300 MG PO CAPS: ORAL_CAPSULE | ORAL | 2 refills | Status: DC

## 2021-05-05 NOTE — Addendum Note (Signed)
Addended by: Colman Cater on: 05/05/2021 02:31 PM ? ? Modules accepted: Orders ? ?

## 2021-08-30 ENCOUNTER — Ambulatory Visit (INDEPENDENT_AMBULATORY_CARE_PROVIDER_SITE_OTHER): Payer: PPO | Admitting: Family Medicine

## 2021-08-30 VITALS — BP 128/100 | HR 90 | Temp 98.3°F | Ht 75.0 in | Wt 198.0 lb

## 2021-08-30 DIAGNOSIS — I10 Essential (primary) hypertension: Secondary | ICD-10-CM | POA: Diagnosis not present

## 2021-08-30 DIAGNOSIS — N3281 Overactive bladder: Secondary | ICD-10-CM

## 2021-08-30 MED ORDER — SOLIFENACIN SUCCINATE 10 MG PO TABS: 10.0000 mg | ORAL_TABLET | Freq: Every day | ORAL | 3 refills | Status: DC

## 2021-08-30 MED ORDER — ESCITALOPRAM OXALATE 10 MG PO TABS: 10.0000 mg | ORAL_TABLET | Freq: Every day | ORAL | 3 refills | Status: DC

## 2021-08-30 MED ORDER — NEBIVOLOL HCL 10 MG PO TABS: 10.0000 mg | ORAL_TABLET | Freq: Every day | ORAL | 11 refills | Status: DC

## 2021-08-30 NOTE — Progress Notes (Signed)
Subjective:    Patient ID: Matthew Luna, male    DOB: [birth year], 81 y.o.   MRN: [id number] 02/03/21 Patient states that his hands have been feeling cold for over a month.  On examination today he has palpable radial and ulnar pulses bilaterally.  He has normal capillary refill.  However he states at times, his hands are ice cold to the thumb.  He denies any purple or blue or white fingertips.  However the cold sensation typically occurs when he is cold outside suggesting possible peripheral vasoconstriction/nodes but normal none.  He denies any numbness or tingling or burning in his hands.  He denies any muscle weakness in his hands.  His blood pressure today is acceptable at 146/84.  He has a history of dizziness due to atherosclerosis in his cerebral blood vessel and neurology has recommended permissive hypertension with a systolic blood pressure near 150.  He is compliant taking his aspirin and Crestor.  His goal LDL is less than 70 and he is due to recheck his cholesterol.  He denies any dizziness today.  At that time, my plan was:  Blood pressure is acceptable.  Continue hydrochlorothiazide in addition to Bystolic.  Check CBC CMP and lipid panel.  Goal LDL cholesterol of less than 70.  Continue aspirin for secondary prevention of stroke.  Maintain permissive hypertension with a blood pressure between 140 and 150 due to high-grade stenosis in the posterior cerebral artery as recommended by neurology.  I believe that the "cold" may be Raynaud's phenomenon.  However at the present time, no treatment is necessary  08/30/21  Patient is here today reporting increased urinary frequency.  He states that he wakes up 4-5 times every night to go to the bathroom.  He denies that there is a weak stream.  He denies any dribbling or spurting.  He denies any dysuria.  He denies any hematuria fever or chills.  This is been a chronic problem for him he states.  In the past he was on medication from a urologist but he  cannot recall the medicine.  He reports sudden urges to have to urinate.  He denies any urge incontinence.  He reports a good strong stream.  His blood pressure today is elevated.  He states that it is 130/92 at home.  He is not taking his Bystolic.  He is supposed to be on hydrochlorothiazide and Bystolic.  He has started taking Lexapro 10 mg a day.  In the past he was on Celexa which we discontinued.  He independently took Lexapro on his own.  He had an old prescription from 2021 and he has been taking it for the last few months.  He states that it is really helping with his anxiety.  He would like to continue this.  Past Medical History:  Diagnosis Date   AAA (abdominal aortic aneurysm)    Actinic keratosis    Allergy    mild    Anxiety    CKD (chronic kidney disease) stage 3, GFR 30-59 ml/min (HCC)    Diverticulosis of colon (without mention of hemorrhage)    Duodenitis without mention of hemorrhage    Esophageal reflux    Hypertrophy of prostate without urinary obstruction and other lower urinary tract symptoms (LUTS)    Left sided ulcerative (chronic) colitis (HCC)    Mononeuritis of unspecified site    Neuropathy    Nonspecific abnormal results of thyroid function study    Prostate cancer ([place])  cryotherapy 2011 [facility])   Schatzki's ring    Sleep apnea    does not use the CPAP   Stricture and stenosis of esophagus    Stroke (HCC)    left parietal, left parietal/occipital, right cerebellar, right pons   TIA (transient ischemic attack) 2017   pt states was told mini stroke   Unspecified essential hypertension    Past Surgical History:  Procedure Laterality Date   ABDOMINAL AORTIC ANEURYSM REPAIR     2018   BALLOON DILATION  03/22/2011   Procedure: BALLOON DILATION;  Surgeon: Inda Castle, MD;  Location: [facility];  Service: Endoscopy;  Laterality: N/A;  kelly/ebp   CHOLECYSTECTOMY     COLONOSCOPY     ENDOVASCULAR REPAIR/STENT GRAFT N/A 03/29/2016   Procedure:  Endovascular Repair/Stent Graft;  Surgeon: Algernon Huxley, MD;  Location: [facility];  Service: Cardiovascular;  Laterality: N/A;   ESOPHAGOGASTRODUODENOSCOPY  03/22/2011   Procedure: ESOPHAGOGASTRODUODENOSCOPY (EGD);  Surgeon: Inda Castle, MD;  Location: [facility];  Service: Endoscopy;  Laterality: N/A;   PROSTATE SURGERY     UPPER GASTROINTESTINAL ENDOSCOPY     Current Outpatient Medications on File Prior to Visit  Medication Sig Dispense Refill   aspirin EC 81 MG tablet Take 81 mg by mouth daily. Swallow whole.     gabapentin (NEURONTIN) 300 MG capsule TAKE 1 CAPSULE (300 MG TOTAL) BY MOUTH AT BEDTIME. FOR NEUROPATHY 90 capsule 2   hydrochlorothiazide (MICROZIDE) 12.5 MG capsule TAKE 1 CAPSULE BY MOUTH EVERY DAY 90 capsule 3   Methylcobalamin (B12-ACTIVE PO) Take by mouth.     rosuvastatin (CRESTOR) 40 MG tablet Take 1 tablet (40 mg total) by mouth daily. 90 tablet 3   No current facility-administered medications on file prior to visit.   Allergies  Allergen Reactions   Metronidazole Anaphylaxis   Amoxicillin-Pot Clavulanate Other (See Comments)    UNKNOWN   Ciprofloxacin Other (See Comments)    UNKNOWN   Clindamycin/Lincomycin Other (See Comments)    Nausea, stomach pain, sweats   Mesalamine     REACTION: Intolerance   Social History   Socioeconomic History   Marital status: Married    Spouse name: Not on file   Number of children: 1   Years of education: Some colg   Highest education level: Not on file  Occupational History   Occupation: Retired   Tobacco Use   Smoking status: Never   Smokeless tobacco: Never  Vaping Use   Vaping Use: Never used  Substance and Sexual Activity   Alcohol use: Yes    Alcohol/week: 14.0 standard drinks of alcohol    Types: 14 Glasses of wine per week    Comment: per pt -drinks 10 oz red wine every night!   Drug use: No   Sexual activity: Never  Other Topics Concern   Not on file  Social History Narrative   1 -2 cups  of coffee a day, some tea consumption    Left handed   Lives with wife. Married x 59 years in 2022.   Truck driver.   Social Determinants of Health   Financial Resource Strain: Low Risk  (02/03/2021)   Overall Financial Resource Strain (CARDIA)    Difficulty of Paying Living Expenses: Not hard at all  Food Insecurity: No Food Insecurity (02/03/2021)   Hunger Vital Sign    Worried About Running Out of Food in the Last Year: Never true    Ran Out of Food in the Last Year:  Never true  Transportation Needs: No Transportation Needs (02/03/2021)   PRAPARE - Hydrologist (Medical): No    Lack of Transportation (Non-Medical): No  Physical Activity: Inactive (02/03/2021)   Exercise Vital Sign    Days of Exercise per Week: 0 days    Minutes of Exercise per Session: 0 min  Stress: No Stress Concern Present (02/03/2021)   [place]    Feeling of Stress : Not at all  Social Connections: Moderately Isolated (02/03/2021)   Social Connection and Isolation Panel [NHANES]    Frequency of Communication with Friends and Family: More than three times a week    Frequency of Social Gatherings with Friends and Family: More than three times a week    Attends Religious Services: Never    Marine scientist or Organizations: No    Attends Archivist Meetings: Never    Marital Status: Married  Human resources officer Violence: Not At Risk (02/03/2021)   Humiliation, Afraid, Rape, and Kick questionnaire    Fear of Current or Ex-Partner: No    Emotionally Abused: No    Physically Abused: No    Sexually Abused: No      Review of Systems  All other systems reviewed and are negative.      Objective:   Physical Exam Vitals reviewed.  Constitutional:      General: He is not in acute distress.    Appearance: He is well-developed. He is not diaphoretic.  Neck:     Thyroid: No thyromegaly.     Vascular: No JVD.   Cardiovascular:     Rate and Rhythm: Normal rate and regular rhythm.     Heart sounds: Normal heart sounds. No murmur heard.    No friction rub. No gallop.  Pulmonary:     Effort: Pulmonary effort is normal. No respiratory distress.     Breath sounds: Normal breath sounds. No wheezing or rales.  Lymphadenopathy:     Cervical: No cervical adenopathy.  Neurological:     Mental Status: He is alert and oriented to person, place, and time.     Motor: Motor function is intact. No tremor, atrophy, abnormal muscle tone or pronator drift.     Coordination: Coordination is intact.           Assessment & Plan:  Overactive bladder  Benign essential HTN Symptoms sound more consistent with overactive bladder.  Start Vesicare 10 mg daily and recheck in 1 month.  Resume Bystolic in addition to his hydrochlorothiazide for hypertension.  I will refill the Lexapro that he has been taking the last few months but I encouraged the patient to be consistent taking the medication.

## 2022-02-08 NOTE — Patient Instructions (Incomplete)
Matthew Luna , Thank you for taking time to come for your Medicare Wellness Visit. I appreciate your ongoing commitment to your health goals. Please review the following plan we discussed and let me know if I can assist you in the future.   These are the goals we discussed:  Goals      Exercise 3x per week (30 min per time)     Encouraged pt to increase exercise.         This is a list of the screening recommended for you and due dates:  Health Maintenance  Topic Date Due   DTaP/Tdap/Td vaccine (1 - Tdap) Never done   Zoster (Shingles) Vaccine (1 of 2) Never done   COVID-19 Vaccine (3 - Moderna risk series) 05/21/2019   Flu Shot  08/30/2021   Medicare Annual Wellness Visit  02/03/2022   Pneumonia Vaccine  Completed   HPV Vaccine  Aged Out    Advanced directives: ***  Conditions/risks identified: Aim for 30 minutes of exercise or brisk walking, 6-8 glasses of water, and 5 servings of fruits and vegetables each day.   Next appointment: Follow up in one year for your annual wellness visit.   Preventive Care 43 Years and Older, Male  Preventive care refers to lifestyle choices and visits with your health care provider that can promote health and wellness. What does preventive care include? A yearly physical exam. This is also called an annual well check. Dental exams once or twice a year. Routine eye exams. Ask your health care provider how often you should have your eyes checked. Personal lifestyle choices, including: Daily care of your teeth and gums. Regular physical activity. Eating a healthy diet. Avoiding tobacco and drug use. Limiting alcohol use. Practicing safe sex. Taking low doses of aspirin every day. Taking vitamin and mineral supplements as recommended by your health care provider. What happens during an annual well check? The services and screenings done by your health care provider during your annual well check will depend on your age, overall health, lifestyle  risk factors, and family history of disease. Counseling  Your health care provider may ask you questions about your: Alcohol use. Tobacco use. Drug use. Emotional well-being. Home and relationship well-being. Sexual activity. Eating habits. History of falls. Memory and ability to understand (cognition). Work and work Statistician. Screening  You may have the following tests or measurements: Height, weight, and BMI. Blood pressure. Lipid and cholesterol levels. These may be checked every 5 years, or more frequently if you are over 46 years old. Skin check. Lung cancer screening. You may have this screening every year starting at age 83 if you have a 30-pack-year history of smoking and currently smoke or have quit within the past 15 years. Fecal occult blood test (FOBT) of the stool. You may have this test every year starting at age 59. Flexible sigmoidoscopy or colonoscopy. You may have a sigmoidoscopy every 5 years or a colonoscopy every 10 years starting at age 77. Prostate cancer screening. Recommendations will vary depending on your family history and other risks. Hepatitis C blood test. Hepatitis B blood test. Sexually transmitted disease (STD) testing. Diabetes screening. This is done by checking your blood sugar (glucose) after you have not eaten for a while (fasting). You may have this done every 1-3 years. Abdominal aortic aneurysm (AAA) screening. You may need this if you are a current or former smoker. Osteoporosis. You may be screened starting at age 79 if you are at high risk.  Talk with your health care provider about your test results, treatment options, and if necessary, the need for more tests. Vaccines  Your health care provider may recommend certain vaccines, such as: Influenza vaccine. This is recommended every year. Tetanus, diphtheria, and acellular pertussis (Tdap, Td) vaccine. You may need a Td booster every 10 years. Zoster vaccine. You may need this after age  33. Pneumococcal 13-valent conjugate (PCV13) vaccine. One dose is recommended after age 41. Pneumococcal polysaccharide (PPSV23) vaccine. One dose is recommended after age 65. Talk to your health care provider about which screenings and vaccines you need and how often you need them. This information is not intended to replace advice given to you by your health care provider. Make sure you discuss any questions you have with your health care provider. Document Released: 02/12/2015 Document Revised: 10/06/2015 Document Reviewed: 11/17/2014 Elsevier Interactive Patient Education  2017 [place] Prevention in the Home Falls can cause injuries. They can happen to people of all ages. There are many things you can do to make your home safe and to help prevent falls. What can I do on the outside of my home? Regularly fix the edges of walkways and driveways and fix any cracks. Remove anything that might make you trip as you walk through a door, such as a raised step or threshold. Trim any bushes or trees on the path to your home. Use bright outdoor lighting. Clear any walking paths of anything that might make someone trip, such as rocks or tools. Regularly check to see if handrails are loose or broken. Make sure that both sides of any steps have handrails. Any raised decks and porches should have guardrails on the edges. Have any leaves, snow, or ice cleared regularly. Use sand or salt on walking paths during winter. Clean up any spills in your garage right away. This includes oil or grease spills. What can I do in the bathroom? Use night lights. Install grab bars by the toilet and in the tub and shower. Do not use towel bars as grab bars. Use non-skid mats or decals in the tub or shower. If you need to sit down in the shower, use a plastic, non-slip stool. Keep the floor dry. Clean up any water that spills on the floor as soon as it happens. Remove soap buildup in the tub or shower  regularly. Attach bath mats securely with double-sided non-slip rug tape. Do not have throw rugs and other things on the floor that can make you trip. What can I do in the bedroom? Use night lights. Make sure that you have a light by your bed that is easy to reach. Do not use any sheets or blankets that are too big for your bed. They should not hang down onto the floor. Have a firm chair that has side arms. You can use this for support while you get dressed. Do not have throw rugs and other things on the floor that can make you trip. What can I do in the kitchen? Clean up any spills right away. Avoid walking on wet floors. Keep items that you use a lot in easy-to-reach places. If you need to reach something above you, use a strong step stool that has a grab bar. Keep electrical cords out of the way. Do not use floor polish or wax that makes floors slippery. If you must use wax, use non-skid floor wax. Do not have throw rugs and other things on the floor that can make  you trip. What can I do with my stairs? Do not leave any items on the stairs. Make sure that there are handrails on both sides of the stairs and use them. Fix handrails that are broken or loose. Make sure that handrails are as long as the stairways. Check any carpeting to make sure that it is firmly attached to the stairs. Fix any carpet that is loose or worn. Avoid having throw rugs at the top or bottom of the stairs. If you do have throw rugs, attach them to the floor with carpet tape. Make sure that you have a light switch at the top of the stairs and the bottom of the stairs. If you do not have them, ask someone to add them for you. What else can I do to help prevent falls? Wear shoes that: Do not have high heels. Have rubber bottoms. Are comfortable and fit you well. Are closed at the toe. Do not wear sandals. If you use a stepladder: Make sure that it is fully opened. Do not climb a closed stepladder. Make sure that  both sides of the stepladder are locked into place. Ask someone to hold it for you, if possible. Clearly mark and make sure that you can see: Any grab bars or handrails. First and last steps. Where the edge of each step is. Use tools that help you move around (mobility aids) if they are needed. These include: Canes. Walkers. Scooters. Crutches. Turn on the lights when you go into a dark area. Replace any light bulbs as soon as they burn out. Set up your furniture so you have a clear path. Avoid moving your furniture around. If any of your floors are uneven, fix them. If there are any pets around you, be aware of where they are. Review your medicines with your doctor. Some medicines can make you feel dizzy. This can increase your chance of falling. Ask your doctor what other things that you can do to help prevent falls. This information is not intended to replace advice given to you by your health care provider. Make sure you discuss any questions you have with your health care provider. Document Released: 11/12/2008 Document Revised: 06/24/2015 Document Reviewed: 02/20/2014 Elsevier Interactive Patient Education  2017 [employer].

## 2022-02-09 NOTE — Progress Notes (Deleted)
Subjective:   Matthew Luna is a 82 y.o. male who presents for Medicare Annual/Subsequent preventive examination.  I connected with  Matthew Luna on 02/09/22 by a audio enabled telemedicine application and verified that I am speaking with the correct person using two identifiers.  Patient Location: Home  Provider Location: Office/Clinic  I discussed the limitations of evaluation and management by telemedicine. The patient expressed understanding and agreed to proceed.  Review of Systems           Objective:    There were no vitals filed for this visit. There is no height or weight on file to calculate BMI.     02/03/2021   11:38 AM 11/17/2020   10:14 AM 11/15/2020    9:38 AM 05/04/2020    9:59 AM 11/18/2019   12:35 PM 07/25/2017    9:27 AM 07/11/2016    2:09 PM  Advanced Directives  Does Patient Have a Medical Advance Directive? Yes  No No No Yes Yes  Type of Paramedic of [place];Living will     Living will [place];Living will  Copy of [place] in Chart? No - copy requested        Would patient like information on creating a medical advance directive?  No - Patient declined         Current Medications (verified) Outpatient Encounter Medications as of 02/09/2022  Medication Sig   aspirin EC 81 MG tablet Take 81 mg by mouth daily. Swallow whole.   escitalopram (LEXAPRO) 10 MG tablet Take 1 tablet (10 mg total) by mouth daily.   gabapentin (NEURONTIN) 300 MG capsule TAKE 1 CAPSULE (300 MG TOTAL) BY MOUTH AT BEDTIME. FOR NEUROPATHY   hydrochlorothiazide (MICROZIDE) 12.5 MG capsule TAKE 1 CAPSULE BY MOUTH EVERY DAY   Methylcobalamin (B12-ACTIVE PO) Take by mouth.   nebivolol (BYSTOLIC) 10 MG tablet Take 1 tablet (10 mg total) by mouth daily.   rosuvastatin (CRESTOR) 40 MG tablet Take 1 tablet (40 mg total) by mouth daily.   solifenacin (VESICARE) 10 MG tablet Take 1 tablet (10 mg total) by mouth daily.   No  facility-administered encounter medications on file as of 02/09/2022.    Allergies (verified) Metronidazole, Amoxicillin-pot clavulanate, Ciprofloxacin, Clindamycin/lincomycin, and Mesalamine   History: Past Medical History:  Diagnosis Date   AAA (abdominal aortic aneurysm)    Actinic keratosis    Allergy    mild    Anxiety    CKD (chronic kidney disease) stage 3, GFR 30-59 ml/min (HCC)    Diverticulosis of colon (without mention of hemorrhage)    Duodenitis without mention of hemorrhage    Esophageal reflux    Hypertrophy of prostate without urinary obstruction and other lower urinary tract symptoms (LUTS)    Left sided ulcerative (chronic) colitis (HCC)    Mononeuritis of unspecified site    Neuropathy    Nonspecific abnormal results of thyroid function study    Prostate cancer ([place])    cryotherapy 2011 ([facility])   Schatzki's ring    Sleep apnea    does not use the CPAP   Stricture and stenosis of esophagus    Stroke (HCC)    left parietal, left parietal/occipital, right cerebellar, right pons   TIA (transient ischemic attack) 2017   pt states was told mini stroke   Unspecified essential hypertension    Past Surgical History:  Procedure Laterality Date   ABDOMINAL AORTIC ANEURYSM REPAIR     2018  BALLOON DILATION  03/22/2011   Procedure: BALLOON DILATION;  Surgeon: Inda Castle, MD;  Location: [facility];  Service: Endoscopy;  Laterality: N/A;  kelly/ebp   CHOLECYSTECTOMY     COLONOSCOPY     ENDOVASCULAR REPAIR/STENT GRAFT N/A 03/29/2016   Procedure: Endovascular Repair/Stent Graft;  Surgeon: Algernon Huxley, MD;  Location: [facility];  Service: Cardiovascular;  Laterality: N/A;   ESOPHAGOGASTRODUODENOSCOPY  03/22/2011   Procedure: ESOPHAGOGASTRODUODENOSCOPY (EGD);  Surgeon: Inda Castle, MD;  Location: [facility];  Service: Endoscopy;  Laterality: N/A;   PROSTATE SURGERY     UPPER GASTROINTESTINAL ENDOSCOPY     Family History  Problem Relation Age of  Onset   Cancer Mother        unknown type   Snoring Father    Anesthesia problems Neg Hx    Hypotension Neg Hx    Malignant hyperthermia Neg Hx    Pseudochol deficiency Neg Hx    Colon cancer Neg Hx    Colon polyps Neg Hx    Esophageal cancer Neg Hx    Stomach cancer Neg Hx    Rectal cancer Neg Hx    Social History   Socioeconomic History   Marital status: Married    Spouse name: Not on file   Number of children: 1   Years of education: Some colg   Highest education level: Not on file  Occupational History   Occupation: Retired   Tobacco Use   Smoking status: Never   Smokeless tobacco: Never  Vaping Use   Vaping Use: Never used  Substance and Sexual Activity   Alcohol use: Yes    Alcohol/week: 14.0 standard drinks of alcohol    Types: 14 Glasses of wine per week    Comment: per pt -drinks 10 oz red wine every night!   Drug use: No   Sexual activity: Never  Other Topics Concern   Not on file  Social History Narrative   1 -2 cups of coffee a day, some tea consumption    Left handed   Lives with wife. Married x 59 years in 2022.   Truck driver.   Social Determinants of Health   Financial Resource Strain: Low Risk  (02/03/2021)   Overall Financial Resource Strain (CARDIA)    Difficulty of Paying Living Expenses: Not hard at all  Food Insecurity: No Food Insecurity (02/03/2021)   Hunger Vital Sign    Worried About Running Out of Food in the Last Year: Never true    Ran Out of Food in the Last Year: Never true  Transportation Needs: No Transportation Needs (02/03/2021)   PRAPARE - Hydrologist (Medical): No    Lack of Transportation (Non-Medical): No  Physical Activity: Inactive (02/03/2021)   Exercise Vital Sign    Days of Exercise per Week: 0 days    Minutes of Exercise per Session: 0 min  Stress: No Stress Concern Present (02/03/2021)   [place]    Feeling of Stress : Not  at all  Social Connections: Moderately Isolated (02/03/2021)   Social Connection and Isolation Panel [NHANES]    Frequency of Communication with Friends and Family: More than three times a week    Frequency of Social Gatherings with Friends and Family: More than three times a week    Attends Religious Services: Never    Marine scientist or Organizations: No    Attends Archivist Meetings:  Never    Marital Status: Married    Tobacco Counseling Counseling given: Not Answered   Clinical Intake:                 Diabetic?No          Activities of Daily Living     No data to display          Patient Care Team: Susy Frizzle, MD as PCP - General (Family Medicine) Pieter Partridge, DO as Consulting Physician (Neurology) Kassie Mends, RN as [place] any recent [place] you may have received from other than [facility] providers in the past year (date may be approximate).     Assessment:   This is a routine wellness examination for [place].  Hearing/Vision screen No results found.  Dietary issues and exercise activities discussed:     Goals Addressed   None    Depression Screen    02/03/2021   11:29 AM 06/05/2019   10:19 AM 10/15/2017    3:38 PM 11/27/2016    9:12 AM 10/30/2016    3:47 PM 05/13/2015    8:51 AM  PHQ 2/9 Scores  PHQ - 2 Score 1 0 0 0 0 0  PHQ- 9 Score     0 0    Fall Risk    02/03/2021   11:38 AM 05/04/2020    9:59 AM 11/18/2019   12:35 PM 11/03/2019    3:36 PM 08/26/2019    4:49 PM  [place] in the past year? 1 0 1 0 0  Comment     [place] Telephone Survey: data to providers prior to load  Number falls in past yr: 0 0 1 0   Injury with Fall? 1 0 0 0   Risk for fall due to : History of fall(s);Impaired balance/gait;Impaired mobility      Follow up    Falls evaluation completed     FALL RISK PREVENTION PERTAINING TO THE HOME:  Any stairs in or around the home?  {YES/NO:21197} If so, are there any without handrails? {YES/NO:21197} Home free of loose throw rugs in walkways, pet beds, electrical cords, etc? {YES/NO:21197} Adequate lighting in your home to reduce risk of falls? {YES/NO:21197}  ASSISTIVE DEVICES UTILIZED TO PREVENT FALLS:  Life alert? {YES/NO:21197} Use of a cane, walker or w/c? {YES/NO:21197} Grab bars in the bathroom? {YES/NO:21197} Shower chair or bench in shower? {YES/NO:21197} Elevated toilet seat or a handicapped toilet? {YES/NO:21197}  TIMED UP AND GO:  Was the test performed? {YES/NO:21197}.  Length of time to ambulate 10 feet: *** sec.   {Appearance of YN:[id number]  Cognitive Function:        02/03/2021   12:46 PM  6CIT Screen  What Year? 0 points  What month? 0 points  What time? 0 points  Count back from 20 0 points  Months in reverse 2 points  Repeat phrase 0 points  Total Score 2 points    Immunizations Immunization History  Administered Date(s) Administered   Influenza Whole 10/30/2004   Influenza, High Dose Seasonal PF 10/03/2016   Influenza,inj,Quad PF,6+ Mos 12/05/2013, 11/19/2014, 10/15/2017   Influenza-Unspecified 10/23/2016   Moderna Sars-Covid-2 Vaccination 03/26/2019, 04/23/2019   Pneumococcal Conjugate-13 08/18/2014   Pneumococcal Polysaccharide-23 03/22/2007    TDAP status: Due, Education has been provided regarding the importance of this vaccine. Advised may receive this vaccine at local pharmacy or Health Dept. Aware to provide a copy of the vaccination record  if obtained from local pharmacy or Health Dept. Verbalized acceptance and understanding.  {Flu Vaccine status:2101806}  Pneumococcal vaccine status: Up to date  Covid-19 vaccine status: Information provided on how to obtain vaccines.   Qualifies for Shingles Vaccine? Yes   Zostavax completed No   Shingrix Completed?: No.    Education has been provided regarding the importance of this vaccine. Patient has been advised to  call insurance company to determine out of pocket expense if they have not yet received this vaccine. Advised may also receive vaccine at local pharmacy or Health Dept. Verbalized acceptance and understanding.  Screening Tests Health Maintenance  Topic Date Due   DTaP/Tdap/Td (1 - Tdap) Never done   Zoster Vaccines- Shingrix (1 of 2) Never done   COVID-19 Vaccine (3 - Moderna risk series) 05/21/2019   INFLUENZA VACCINE  08/30/2021   Medicare Annual Wellness (AWV)  02/03/2022   Pneumonia Vaccine 56+ Years old  Completed   HPV VACCINES  Aged Out    Health Maintenance  Health Maintenance Due  Topic Date Due   DTaP/Tdap/Td (1 - Tdap) Never done   Zoster Vaccines- Shingrix (1 of 2) Never done   COVID-19 Vaccine (3 - Moderna risk series) 05/21/2019   INFLUENZA VACCINE  08/30/2021   Medicare Annual Wellness (AWV)  02/03/2022    Colorectal cancer screening: No longer required.   Lung Cancer Screening: (Low Dose CT Chest recommended if Age 83-80 years, 30 pack-year currently smoking OR have quit w/in 15years.) does not qualify.   Lung Cancer Screening Referral: n/a   Additional Screening:  Hepatitis C Screening: does not qualify  Vision Screening: Recommended annual ophthalmology exams for early detection of glaucoma and other disorders of the eye. Is the patient up to date with their annual eye exam?  {YES/NO:21197} Who is the provider or what is the name of the office in which the patient attends annual eye exams? *** If pt is not established with a provider, would they like to be referred to a provider to establish care? {YES/NO:21197}.   Dental Screening: Recommended annual dental exams for proper oral hygiene  Community Resource Referral / Chronic Care Management: CRR required this visit?  {YES/NO:21197}  CCM required this visit?  {YES/NO:21197}     Plan:     I have personally reviewed and noted the following in the patient's chart:   Medical and social history Use  of alcohol, tobacco or illicit drugs  Current medications and supplements including opioid prescriptions. {Opioid Prescriptions:269 [id number]} Functional ability and status Nutritional status Physical activity Advanced directives List of other physicians Hospitalizations, surgeries, and ER visits in previous 12 months Vitals Screenings to include cognitive, depression, and falls Referrals and appointments  In addition, I have reviewed and discussed with patient certain preventive protocols, quality metrics, and best practice recommendations. A written personalized care plan for preventive services as well as general preventive health recommendations were provided to patient.     Denman George [place], [place]   [id number]   Nurse Notes: ***

## 2022-02-13 ENCOUNTER — Telehealth: Payer: Self-pay | Admitting: Family Medicine

## 2022-02-13 NOTE — Telephone Encounter (Signed)
No answer unable to leave a message for patient to call back and schedule Medicare Annual Wellness Visit (AWV) in office.   If not able to come in office, please offer to do virtually or by telephone.   Last AWV:02/03/2021   Please schedule at any time with B[facility]-Nurse Health Advisor.  30 minute appointment  Any questions, please contact me at [phone]   Thank you,   [facility]  Ambulatory Clinical Support for [place] Are. We Are. [facility] ??[phone] or ??[phone]

## 2022-03-14 ENCOUNTER — Telehealth: Payer: Self-pay | Admitting: Family Medicine

## 2022-03-14 NOTE — Telephone Encounter (Signed)
Left message for patient to call back and schedule Medicare Annual Wellness Visit (AWV) in office.   If not able to come in office, please offer to do virtually or by telephone.   Last AWV:02/03/2021  Please schedule at any time with BSFM-Nurse Health Advisor.  30 minute appointment  Any questions, please contact me at [phone]   Thank you,   [facility]  Ambulatory Clinical Support for [place] Are. We Are. [facility] ??[phone] or ??[phone]

## 2022-04-25 ENCOUNTER — Ambulatory Visit: Payer: PPO | Admitting: Family Medicine

## 2022-05-01 ENCOUNTER — Ambulatory Visit: Payer: PPO | Admitting: Family Medicine

## 2022-05-04 ENCOUNTER — Encounter: Payer: Self-pay | Admitting: Family Medicine

## 2022-05-04 ENCOUNTER — Ambulatory Visit (INDEPENDENT_AMBULATORY_CARE_PROVIDER_SITE_OTHER): Payer: PPO | Admitting: Family Medicine

## 2022-05-04 VITALS — BP 120/68 | HR 71 | Temp 97.5°F | Ht 75.0 in | Wt 185.0 lb

## 2022-05-04 DIAGNOSIS — H1131 Conjunctival hemorrhage, right eye: Secondary | ICD-10-CM | POA: Diagnosis not present

## 2022-05-04 NOTE — Progress Notes (Signed)
Subjective:    Patient ID: Matthew Luna, male    DOB: 06/04/1940, 82 y.o.   MRN: [id number]  Patient noticed this yesterday.  Denies eye pain.  Denies blurry vision.  Denies headache.  Was straining to have BM yesterday.  BP is well controlled.  On aspirin.  Denies headache.   Past Medical History:  Diagnosis Date   AAA (abdominal aortic aneurysm)    Actinic keratosis    Allergy    mild    Anxiety    CKD (chronic kidney disease) stage 3, GFR 30-59 ml/min    Diverticulosis of colon (without mention of hemorrhage)    Duodenitis without mention of hemorrhage    Esophageal reflux    Hypertrophy of prostate without urinary obstruction and other lower urinary tract symptoms (LUTS)    Left sided ulcerative (chronic) colitis    Mononeuritis of unspecified site    Neuropathy    Nonspecific abnormal results of thyroid function study    Prostate cancer    cryotherapy 2011 ([facility])   Schatzki's ring    Sleep apnea    does not use the CPAP   Stricture and stenosis of esophagus    Stroke    left parietal, left parietal/occipital, right cerebellar, right pons   TIA (transient ischemic attack) 2017   pt states was told mini stroke   Unspecified essential hypertension    Past Surgical History:  Procedure Laterality Date   ABDOMINAL AORTIC ANEURYSM REPAIR     2018   BALLOON DILATION  03/22/2011   Procedure: BALLOON DILATION;  Surgeon: Inda Castle, MD;  Location: [facility];  Service: Endoscopy;  Laterality: N/A;  kelly/ebp   CHOLECYSTECTOMY     COLONOSCOPY     ENDOVASCULAR REPAIR/STENT GRAFT N/A 03/29/2016   Procedure: Endovascular Repair/Stent Graft;  Surgeon: Algernon Huxley, MD;  Location: [facility];  Service: Cardiovascular;  Laterality: N/A;   ESOPHAGOGASTRODUODENOSCOPY  03/22/2011   Procedure: ESOPHAGOGASTRODUODENOSCOPY (EGD);  Surgeon: Inda Castle, MD;  Location: [facility];  Service: Endoscopy;  Laterality: N/A;   PROSTATE SURGERY     UPPER GASTROINTESTINAL  ENDOSCOPY     Current Outpatient Medications on File Prior to Visit  Medication Sig Dispense Refill   aspirin EC 81 MG tablet Take 81 mg by mouth daily. Swallow whole.     escitalopram (LEXAPRO) 10 MG tablet Take 1 tablet (10 mg total) by mouth daily. 90 tablet 3   gabapentin (NEURONTIN) 300 MG capsule TAKE 1 CAPSULE (300 MG TOTAL) BY MOUTH AT BEDTIME. FOR NEUROPATHY 90 capsule 2   hydrochlorothiazide (MICROZIDE) 12.5 MG capsule TAKE 1 CAPSULE BY MOUTH EVERY DAY 90 capsule 3   Methylcobalamin (B12-ACTIVE PO) Take by mouth.     nebivolol (BYSTOLIC) 10 MG tablet Take 1 tablet (10 mg total) by mouth daily. 30 tablet 11   rosuvastatin (CRESTOR) 40 MG tablet Take 1 tablet (40 mg total) by mouth daily. 90 tablet 3   solifenacin (VESICARE) 10 MG tablet Take 1 tablet (10 mg total) by mouth daily. 90 tablet 3   No current facility-administered medications on file prior to visit.   Allergies  Allergen Reactions   Metronidazole Anaphylaxis   Amoxicillin-Pot Clavulanate Other (See Comments)    UNKNOWN   Ciprofloxacin Other (See Comments)    UNKNOWN   Clindamycin/Lincomycin Other (See Comments)    Nausea, stomach pain, sweats   Mesalamine     REACTION: Intolerance   Social History   Socioeconomic History   Marital  status: Married    Spouse name: Not on file   Number of children: 1   Years of education: Some colg   Highest education level: Not on file  Occupational History   Occupation: Retired   Tobacco Use   Smoking status: Never   Smokeless tobacco: Never  Vaping Use   Vaping Use: Never used  Substance and Sexual Activity   Alcohol use: Yes    Alcohol/week: 14.0 standard drinks of alcohol    Types: 14 Glasses of wine per week    Comment: per pt -drinks 10 oz red wine every night!   Drug use: No   Sexual activity: Never  Other Topics Concern   Not on file  Social History Narrative   1 -2 cups of coffee a day, some tea consumption    Left handed   Lives with wife. Married x  59 years in 2022.   Truck driver.   Social Determinants of Health   Financial Resource Strain: Low Risk  (02/03/2021)   Overall Financial Resource Strain (CARDIA)    Difficulty of Paying Living Expenses: Not hard at all  Food Insecurity: No Food Insecurity (02/03/2021)   Hunger Vital Sign    Worried About Running Out of Food in the Last Year: Never true    Ran Out of Food in the Last Year: Never true  Transportation Needs: No Transportation Needs (02/03/2021)   PRAPARE - Hydrologist (Medical): No    Lack of Transportation (Non-Medical): No  Physical Activity: Inactive (02/03/2021)   Exercise Vital Sign    Days of Exercise per Week: 0 days    Minutes of Exercise per Session: 0 min  Stress: No Stress Concern Present (02/03/2021)   [place]    Feeling of Stress : Not at all  Social Connections: Moderately Isolated (02/03/2021)   Social Connection and Isolation Panel [NHANES]    Frequency of Communication with Friends and Family: More than three times a week    Frequency of Social Gatherings with Friends and Family: More than three times a week    Attends Religious Services: Never    Marine scientist or Organizations: No    Attends Archivist Meetings: Never    Marital Status: Married  Human resources officer Violence: Not At Risk (02/03/2021)   Humiliation, Afraid, Rape, and Kick questionnaire    Fear of Current or Ex-Partner: No    Emotionally Abused: No    Physically Abused: No    Sexually Abused: No      Review of Systems  All other systems reviewed and are negative.      Objective:   Physical Exam Vitals reviewed.  Constitutional:      General: He is not in acute distress.    Appearance: He is well-developed. He is not diaphoretic.  Eyes:     General: Lids are normal.        Right eye: No foreign body or hordeolum.     Conjunctiva/sclera:     Right eye: Right  conjunctiva is not injected. Hemorrhage present. No chemosis or exudate. Neck:     Thyroid: No thyromegaly.     Vascular: No JVD.  Cardiovascular:     Rate and Rhythm: Normal rate and regular rhythm.     Heart sounds: Normal heart sounds. No murmur heard.    No friction rub. No gallop.  Pulmonary:     Effort: Pulmonary effort is  normal. No respiratory distress.     Breath sounds: Normal breath sounds. No wheezing or rales.  Lymphadenopathy:     Cervical: No cervical adenopathy.  Neurological:     Mental Status: He is alert and oriented to person, place, and time.     Motor: Motor function is intact. No tremor, atrophy, abnormal muscle tone or pronator drift.     Coordination: Coordination is intact.           Assessment & Plan:  Subconjunctival hemorrhage of right eye Reassured patient condition is benign and self-limited.  No treatment is needed unless symptoms change.

## 2022-05-08 ENCOUNTER — Encounter: Payer: Self-pay | Admitting: Family Medicine

## 2022-05-08 ENCOUNTER — Ambulatory Visit (INDEPENDENT_AMBULATORY_CARE_PROVIDER_SITE_OTHER): Payer: PPO | Admitting: Family Medicine

## 2022-05-08 VITALS — BP 128/82 | HR 78 | Temp 97.2°F | Ht 75.0 in | Wt 187.0 lb

## 2022-05-08 DIAGNOSIS — N3281 Overactive bladder: Secondary | ICD-10-CM

## 2022-05-08 MED ORDER — TRIAMCINOLONE ACETONIDE 0.1 % EX CREA: 1.0000 | TOPICAL_CREAM | Freq: Two times a day (BID) | CUTANEOUS | 0 refills | Status: DC

## 2022-05-08 NOTE — Progress Notes (Signed)
Subjective:    Patient ID: Matthew Luna, male    DOB: Jun 14, 1940, 82 y.o.   MRN: [id number]  Patient has a history of overactive bladder.  He also has a history of prostate cancer.  He underwent prostate cryoablation in 2011 and TURP previously.  He reports increased urinary frequency.  He states at night he wakes up to urinate 3-4 times every evening.  He will feel a sudden urge and he has to get up immediately to go the bathroom and urinate.  He denies any stress incontinence.  He denies any urge incontinence.  He denies any fevers or chills.  I did a digital rectal exam today.  There is no external pain.  He also reports increased urinary frequency during the daytime.  He is supposed to be on Vesicare but I am not convinced he is actually taking the medication.  Patient has memory loss and cannot recall exactly what he is taking. Past Medical History:  Diagnosis Date   AAA (abdominal aortic aneurysm)    Actinic keratosis    Allergy    mild    Anxiety    CKD (chronic kidney disease) stage 3, GFR 30-59 ml/min    Diverticulosis of colon (without mention of hemorrhage)    Duodenitis without mention of hemorrhage    Esophageal reflux    Hypertrophy of prostate without urinary obstruction and other lower urinary tract symptoms (LUTS)    Left sided ulcerative (chronic) colitis    Mononeuritis of unspecified site    Neuropathy    Nonspecific abnormal results of thyroid function study    Prostate cancer    cryotherapy 2011 ([facility])   Schatzki's ring    Sleep apnea    does not use the CPAP   Stricture and stenosis of esophagus    Stroke    left parietal, left parietal/occipital, right cerebellar, right pons   TIA (transient ischemic attack) 2017   pt states was told mini stroke   Unspecified essential hypertension    Past Surgical History:  Procedure Laterality Date   ABDOMINAL AORTIC ANEURYSM REPAIR     2018   BALLOON DILATION  03/22/2011   Procedure: BALLOON DILATION;  Surgeon:  Louis Meckel, MD;  Location: [facility];  Service: Endoscopy;  Laterality: N/A;  kelly/ebp   CHOLECYSTECTOMY     COLONOSCOPY     ENDOVASCULAR REPAIR/STENT GRAFT N/A 03/29/2016   Procedure: Endovascular Repair/Stent Graft;  Surgeon: Annice Needy, MD;  Location: [facility];  Service: Cardiovascular;  Laterality: N/A;   ESOPHAGOGASTRODUODENOSCOPY  03/22/2011   Procedure: ESOPHAGOGASTRODUODENOSCOPY (EGD);  Surgeon: Louis Meckel, MD;  Location: [facility];  Service: Endoscopy;  Laterality: N/A;   PROSTATE SURGERY     UPPER GASTROINTESTINAL ENDOSCOPY     Current Outpatient Medications on File Prior to Visit  Medication Sig Dispense Refill   aspirin EC 81 MG tablet Take 81 mg by mouth daily. Swallow whole.     escitalopram (LEXAPRO) 10 MG tablet Take 1 tablet (10 mg total) by mouth daily. 90 tablet 3   gabapentin (NEURONTIN) 300 MG capsule TAKE 1 CAPSULE (300 MG TOTAL) BY MOUTH AT BEDTIME. FOR NEUROPATHY 90 capsule 2   hydrochlorothiazide (MICROZIDE) 12.5 MG capsule TAKE 1 CAPSULE BY MOUTH EVERY DAY 90 capsule 3   Methylcobalamin (B12-ACTIVE PO) Take by mouth.     nebivolol (BYSTOLIC) 10 MG tablet Take 1 tablet (10 mg total) by mouth daily. 30 tablet 11   rosuvastatin (CRESTOR) 40 MG tablet  Take 1 tablet (40 mg total) by mouth daily. 90 tablet 3   solifenacin (VESICARE) 10 MG tablet Take 1 tablet (10 mg total) by mouth daily. 90 tablet 3   No current facility-administered medications on file prior to visit.     Allergies  Allergen Reactions   Metronidazole Anaphylaxis   Amoxicillin-Pot Clavulanate Other (See Comments)    UNKNOWN   Ciprofloxacin Other (See Comments)    UNKNOWN   Clindamycin/Lincomycin Other (See Comments)    Nausea, stomach pain, sweats   Mesalamine     REACTION: Intolerance   Social History   Socioeconomic History   Marital status: Married    Spouse name: Not on file   Number of children: 1   Years of education: Some colg   Highest education  level: Not on file  Occupational History   Occupation: Retired   Tobacco Use   Smoking status: Never   Smokeless tobacco: Never  Vaping Use   Vaping Use: Never used  Substance and Sexual Activity   Alcohol use: Yes    Alcohol/week: 14.0 standard drinks of alcohol    Types: 14 Glasses of wine per week    Comment: per pt -drinks 10 oz red wine every night!   Drug use: No   Sexual activity: Never  Other Topics Concern   Not on file  Social History Narrative   1 -2 cups of coffee a day, some tea consumption    Left handed   Lives with wife. Married x 59 years in 2022.   Truck driver.   Social Determinants of Health   Financial Resource Strain: Low Risk  (02/03/2021)   Overall Financial Resource Strain (CARDIA)    Difficulty of Paying Living Expenses: Not hard at all  Food Insecurity: No Food Insecurity (02/03/2021)   Hunger Vital Sign    Worried About Running Out of Food in the Last Year: Never true    Ran Out of Food in the Last Year: Never true  Transportation Needs: No Transportation Needs (02/03/2021)   PRAPARE - Administrator, Civil ServiceTransportation    Lack of Transportation (Medical): No    Lack of Transportation (Non-Medical): No  Physical Activity: Inactive (02/03/2021)   Exercise Vital Sign    Days of Exercise per Week: 0 days    Minutes of Exercise per Session: 0 min  Stress: No Stress Concern Present (02/03/2021)   [employer] - Occupational Stress Questionnaire    Feeling of Stress : Not at all  Social Connections: Moderately Isolated (02/03/2021)   Social Connection and Isolation Panel [NHANES]    Frequency of Communication with Friends and Family: More than three times a week    Frequency of Social Gatherings with Friends and Family: More than three times a week    Attends Religious Services: Never    Database administratorActive Member of Clubs or Organizations: No    Attends BankerClub or Organization Meetings: Never    Marital Status: Married  Catering managerntimate Partner Violence: Not At Risk  (02/03/2021)   Humiliation, Afraid, Rape, and Kick questionnaire    Fear of Current or Ex-Partner: No    Emotionally Abused: No    Physically Abused: No    Sexually Abused: No      Review of Systems  All other systems reviewed and are negative.      Objective:   Physical Exam Vitals reviewed.  Constitutional:      General: He is not in acute distress.    Appearance: He is well-developed. He  is not diaphoretic.  Neck:     Thyroid: No thyromegaly.     Vascular: No JVD.  Cardiovascular:     Rate and Rhythm: Normal rate and regular rhythm.     Heart sounds: Normal heart sounds. No murmur heard.    No friction rub. No gallop.  Pulmonary:     Effort: Pulmonary effort is normal. No respiratory distress.     Breath sounds: Normal breath sounds. No wheezing or rales.  Genitourinary:    Prostate: Normal.  Lymphadenopathy:     Cervical: No cervical adenopathy.  Neurological:     Mental Status: He is alert and oriented to person, place, and time.     Motor: Motor function is intact. No tremor, atrophy, abnormal muscle tone or pronator drift.     Coordination: Coordination is intact.           Assessment & Plan:  Overactive bladder - Plan: Urinalysis, Routine w reflex microscopic I will add Myrbetriq 25 mg daily.  Gave him samples.  Increase to 50 mg if not effective after 1 month.  Recheck in 1 month or sooner if worsening

## 2022-05-09 ENCOUNTER — Encounter: Payer: Self-pay | Admitting: Family Medicine

## 2022-05-09 LAB — URINALYSIS, ROUTINE W REFLEX MICROSCOPIC
Bacteria, UA: NONE SEEN /HPF
Bilirubin Urine: NEGATIVE
Glucose, UA: NEGATIVE
Hgb urine dipstick: NEGATIVE
Hyaline Cast: NONE SEEN /LPF
Ketones, ur: NEGATIVE
Leukocytes,Ua: NEGATIVE
Nitrite: NEGATIVE
RBC / HPF: NONE SEEN /HPF (ref 0–2)
Specific Gravity, Urine: 1.018 (ref 1.001–1.035)
Squamous Epithelial / HPF: NONE SEEN /HPF (ref <=5)
pH: 6.5 (ref 5.0–8.0)

## 2022-05-16 ENCOUNTER — Telehealth: Payer: Self-pay | Admitting: Family Medicine

## 2022-05-16 NOTE — Telephone Encounter (Signed)
Contacted [name] to schedule their annual wellness visit. Appointment made for 05/18/2022.   Thank you,  Judeth Cornfield,  AMB Clinical Support [facility] AWV Program Direct Dial ??[phone]

## 2022-05-18 ENCOUNTER — Ambulatory Visit: Payer: PPO

## 2022-05-18 NOTE — Progress Notes (Signed)
.  Erroneous encounter - pt not seen.

## 2022-05-18 NOTE — Patient Instructions (Signed)
Mr. Popoca , Thank you for taking time to come for your Medicare Wellness Visit. I appreciate your ongoing commitment to your health goals. Please review the following plan we discussed and let me know if I can assist you in the future.   These are the goals we discussed:  Goals      Exercise 3x per week (30 min per time)     Encouraged pt to increase exercise.         This is a list of the screening recommended for you and due dates:  Health Maintenance  Topic Date Due   DTaP/Tdap/Td vaccine (1 - Tdap) Never done   Zoster (Shingles) Vaccine (1 of 2) Never done   COVID-19 Vaccine (3 - Moderna risk series) 05/21/2019   Medicare Annual Wellness Visit  02/03/2022   Flu Shot  08/31/2022   Pneumonia Vaccine  Completed   HPV Vaccine  Aged Out    Advanced directives:   Conditions/risks identified: Aim for 30 minutes of exercise or brisk walking, 6-8 glasses of water, and 5 servings of fruits and vegetables each day.  Next appointment: Follow up in one year for your annual wellness visit.   Preventive Care 81 Years and Older, Male  Preventive care refers to lifestyle choices and visits with your health care provider that can promote health and wellness. What does preventive care include? A yearly physical exam. This is also called an annual well check. Dental exams once or twice a year. Routine eye exams. Ask your health care provider how often you should have your eyes checked. Personal lifestyle choices, including: Daily care of your teeth and gums. Regular physical activity. Eating a healthy diet. Avoiding tobacco and drug use. Limiting alcohol use. Practicing safe sex. Taking low doses of aspirin every day. Taking vitamin and mineral supplements as recommended by your health care provider. What happens during an annual well check? The services and screenings done by your health care provider during your annual well check will depend on your age, overall health, lifestyle  risk factors, and family history of disease. Counseling  Your health care provider may ask you questions about your: Alcohol use. Tobacco use. Drug use. Emotional well-being. Home and relationship well-being. Sexual activity. Eating habits. History of falls. Memory and ability to understand (cognition). Work and work Astronomer. Screening  You may have the following tests or measurements: Height, weight, and BMI. Blood pressure. Lipid and cholesterol levels. These may be checked every 5 years, or more frequently if you are over 79 years old. Skin check. Lung cancer screening. You may have this screening every year starting at age 42 if you have a 30-pack-year history of smoking and currently smoke or have quit within the past 15 years. Fecal occult blood test (FOBT) of the stool. You may have this test every year starting at age 17. Flexible sigmoidoscopy or colonoscopy. You may have a sigmoidoscopy every 5 years or a colonoscopy every 10 years starting at age 81. Prostate cancer screening. Recommendations will vary depending on your family history and other risks. Hepatitis C blood test. Hepatitis B blood test. Sexually transmitted disease (STD) testing. Diabetes screening. This is done by checking your blood sugar (glucose) after you have not eaten for a while (fasting). You may have this done every 1-3 years. Abdominal aortic aneurysm (AAA) screening. You may need this if you are a current or former smoker. Osteoporosis. You may be screened starting at age 85 if you are at high risk. Talk  with your health care provider about your test results, treatment options, and if necessary, the need for more tests. Vaccines  Your health care provider may recommend certain vaccines, such as: Influenza vaccine. This is recommended every year. Tetanus, diphtheria, and acellular pertussis (Tdap, Td) vaccine. You may need a Td booster every 10 years. Zoster vaccine. You may need this after age  81. Pneumococcal 13-valent conjugate (PCV13) vaccine. One dose is recommended after age 40. Pneumococcal polysaccharide (PPSV23) vaccine. One dose is recommended after age 70. Talk to your health care provider about which screenings and vaccines you need and how often you need them. This information is not intended to replace advice given to you by your health care provider. Make sure you discuss any questions you have with your health care provider. Document Released: 02/12/2015 Document Revised: 10/06/2015 Document Reviewed: 11/17/2014 Elsevier Interactive Patient Education  2017 [place] Prevention in the Home Falls can cause injuries. They can happen to people of all ages. There are many things you can do to make your home safe and to help prevent falls. What can I do on the outside of my home? Regularly fix the edges of walkways and driveways and fix any cracks. Remove anything that might make you trip as you walk through a door, such as a raised step or threshold. Trim any bushes or trees on the path to your home. Use bright outdoor lighting. Clear any walking paths of anything that might make someone trip, such as rocks or tools. Regularly check to see if handrails are loose or broken. Make sure that both sides of any steps have handrails. Any raised decks and porches should have guardrails on the edges. Have any leaves, snow, or ice cleared regularly. Use sand or salt on walking paths during winter. Clean up any spills in your garage right away. This includes oil or grease spills. What can I do in the bathroom? Use night lights. Install grab bars by the toilet and in the tub and shower. Do not use towel bars as grab bars. Use non-skid mats or decals in the tub or shower. If you need to sit down in the shower, use a plastic, non-slip stool. Keep the floor dry. Clean up any water that spills on the floor as soon as it happens. Remove soap buildup in the tub or shower  regularly. Attach bath mats securely with double-sided non-slip rug tape. Do not have throw rugs and other things on the floor that can make you trip. What can I do in the bedroom? Use night lights. Make sure that you have a light by your bed that is easy to reach. Do not use any sheets or blankets that are too big for your bed. They should not hang down onto the floor. Have a firm chair that has side arms. You can use this for support while you get dressed. Do not have throw rugs and other things on the floor that can make you trip. What can I do in the kitchen? Clean up any spills right away. Avoid walking on wet floors. Keep items that you use a lot in easy-to-reach places. If you need to reach something above you, use a strong step stool that has a grab bar. Keep electrical cords out of the way. Do not use floor polish or wax that makes floors slippery. If you must use wax, use non-skid floor wax. Do not have throw rugs and other things on the floor that can make you  trip. What can I do with my stairs? Do not leave any items on the stairs. Make sure that there are handrails on both sides of the stairs and use them. Fix handrails that are broken or loose. Make sure that handrails are as long as the stairways. Check any carpeting to make sure that it is firmly attached to the stairs. Fix any carpet that is loose or worn. Avoid having throw rugs at the top or bottom of the stairs. If you do have throw rugs, attach them to the floor with carpet tape. Make sure that you have a light switch at the top of the stairs and the bottom of the stairs. If you do not have them, ask someone to add them for you. What else can I do to help prevent falls? Wear shoes that: Do not have high heels. Have rubber bottoms. Are comfortable and fit you well. Are closed at the toe. Do not wear sandals. If you use a stepladder: Make sure that it is fully opened. Do not climb a closed stepladder. Make sure that  both sides of the stepladder are locked into place. Ask someone to hold it for you, if possible. Clearly mark and make sure that you can see: Any grab bars or handrails. First and last steps. Where the edge of each step is. Use tools that help you move around (mobility aids) if they are needed. These include: Canes. Walkers. Scooters. Crutches. Turn on the lights when you go into a dark area. Replace any light bulbs as soon as they burn out. Set up your furniture so you have a clear path. Avoid moving your furniture around. If any of your floors are uneven, fix them. If there are any pets around you, be aware of where they are. Review your medicines with your doctor. Some medicines can make you feel dizzy. This can increase your chance of falling. Ask your doctor what other things that you can do to help prevent falls. This information is not intended to replace advice given to you by your health care provider. Make sure you discuss any questions you have with your health care provider. Document Released: 11/12/2008 Document Revised: 06/24/2015 Document Reviewed: 02/20/2014 Elsevier Interactive Patient Education  2017 [employer].

## 2022-06-21 ENCOUNTER — Other Ambulatory Visit: Payer: Self-pay

## 2022-06-21 MED ORDER — GABAPENTIN 300 MG PO CAPS: ORAL_CAPSULE | ORAL | 0 refills | Status: DC

## 2022-06-21 NOTE — Telephone Encounter (Signed)
Requested Prescriptions  Pending Prescriptions Disp Refills   gabapentin (NEURONTIN) 300 MG capsule 90 capsule 0    Sig: TAKE 1 CAPSULE (300 MG TOTAL) BY MOUTH AT BEDTIME. FOR NEUROPATHY     Neurology: Anticonvulsants - gabapentin Failed - 06/21/2022  4:43 PM      Failed - Cr in normal range and within 360 days    Creat  Date Value Ref Range Status  02/03/2021 1.95 (H) 0.70 - 1.28 mg/dL Final   Creatinine, Urine  Date Value Ref Range Status  10/27/2019 262 20 - 320 mg/dL Final         Failed - Completed PHQ-2 or PHQ-9 in the last 360 days      Failed - Valid encounter within last 12 months    Recent Outpatient Visits           1 year ago Benign essential HTN   [facility] Donita Brooks, MD   1 year ago Cerebrovascular accident (CVA) due to stenosis of small artery (HCC)   [facility] Donita Brooks, MD   1 year ago Cerebrovascular accident (CVA) due to stenosis of small artery (HCC)   [facility] Pickard, Priscille Heidelberg, MD   1 year ago Intracranial atherosclerosis   [facility] Donita Brooks, MD   2 years ago Actinic keratosis   [facility] Pickard, Priscille Heidelberg, MD

## 2022-06-21 NOTE — Telephone Encounter (Signed)
Prescription Request  06/21/2022  LOV: 05/08/22  What is the name of the medication or equipment? gabapentin (NEURONTIN) 300 MG capsule [[id number]]  Have you contacted your pharmacy to request a refill? Yes   Which pharmacy would you like this sent to?  [facility] [address] [place] - [address] [place] [address] Phone: [phone] Fax: [phone]    Patient notified that their request is being sent to the clinical staff for review and that they should receive a response within 2 business days.   Please advise at [facility] [phone]

## 2022-06-27 ENCOUNTER — Other Ambulatory Visit: Payer: Self-pay | Admitting: Family Medicine

## 2022-07-01 ENCOUNTER — Other Ambulatory Visit: Payer: Self-pay | Admitting: Family Medicine

## 2022-07-03 NOTE — Telephone Encounter (Signed)
Unable to refill per protocol, Rx request is too soon. Last refill 06/21/22 and 06/27/22 for 90 days.  Requested Prescriptions  Pending Prescriptions Disp Refills   gabapentin (NEURONTIN) 300 MG capsule [Pharmacy Med Name: GABAPENTIN 300 MG CAPSULE] 90 capsule 0    Sig: TAKE 1 CAPSULE (300 MG TOTAL) BY MOUTH AT BEDTIME. FOR NEUROPATHY     Neurology: Anticonvulsants - gabapentin Failed - 07/01/2022 10:57 AM      Failed - Cr in normal range and within 360 days    Creat  Date Value Ref Range Status  02/03/2021 1.95 (H) 0.70 - 1.28 mg/dL Final   Creatinine, Urine  Date Value Ref Range Status  10/27/2019 262 20 - 320 mg/dL Final         Failed - Completed PHQ-2 or PHQ-9 in the last 360 days      Failed - Valid encounter within last 12 months    Recent Outpatient Visits           1 year ago Benign essential HTN   [facility] Donita Brooks, MD   1 year ago Cerebrovascular accident (CVA) due to stenosis of small artery (HCC)   [facility] Donita Brooks, MD   1 year ago Cerebrovascular accident (CVA) due to stenosis of small artery (HCC)   [facility] Pickard, Priscille Heidelberg, MD   1 year ago Intracranial atherosclerosis   [facility] Tanya Nones, Priscille Heidelberg, MD   2 years ago Actinic keratosis   [facility] Tanya Nones, Priscille Heidelberg, MD               triamcinolone cream (KENALOG) 0.1 % [Pharmacy Med Name: TRIAMCINOLONE 0.1% CREAM] 30 g 0    Sig: APPLY TO AFFECTED AREA TWICE A DAY     Not Delegated - Dermatology:  Corticosteroids Failed - 07/01/2022 10:57 AM      Failed - This refill cannot be delegated      Failed - Valid encounter within last 12 months    Recent Outpatient Visits           1 year ago Benign essential HTN   [facility] Donita Brooks, MD   1 year ago Cerebrovascular accident (CVA) due to stenosis of small artery (HCC)   [facility] Donita Brooks,  MD   1 year ago Cerebrovascular accident (CVA) due to stenosis of small artery (HCC)   [facility] Pickard, Priscille Heidelberg, MD   1 year ago Intracranial atherosclerosis   [facility] Donita Brooks, MD   2 years ago Actinic keratosis   [facility] Pickard, Priscille Heidelberg, MD

## 2022-07-06 ENCOUNTER — Ambulatory Visit (INDEPENDENT_AMBULATORY_CARE_PROVIDER_SITE_OTHER): Payer: PPO | Admitting: Family Medicine

## 2022-07-06 VITALS — BP 136/82 | HR 85 | Temp 97.5°F | Ht 75.0 in | Wt 181.6 lb

## 2022-07-06 DIAGNOSIS — G629 Polyneuropathy, unspecified: Secondary | ICD-10-CM

## 2022-07-06 DIAGNOSIS — N3281 Overactive bladder: Secondary | ICD-10-CM | POA: Diagnosis not present

## 2022-07-06 MED ORDER — MIRABEGRON ER 25 MG PO TB24: 25.0000 mg | ORAL_TABLET | Freq: Every day | ORAL | 0 refills | Status: DC

## 2022-07-06 MED ORDER — GABAPENTIN 300 MG PO CAPS: 300.0000 mg | ORAL_CAPSULE | Freq: Two times a day (BID) | ORAL | 0 refills | Status: DC

## 2022-07-06 NOTE — Assessment & Plan Note (Signed)
Chronic. Relieved with Myrbetriq sample, will order Myrbetriq 25mg  daily. Return to office if symptoms persist or worsen. Encouraged to schedule follow-up with PCP.

## 2022-07-06 NOTE — Assessment & Plan Note (Signed)
Increase Gabapentin to 300mg  BID.Return to office if symptoms persist or worsen.

## 2022-07-06 NOTE — Progress Notes (Signed)
Subjective:  HPI: Matthew Luna is a 82 y.o. male presenting on 07/06/2022 for Foot Pain (Pt c/o increasing pain in feet. Pt states that the Gabapetin is not helping with his neuropathy. )   Foot Pain   Patient is in today for unrelieved neuropathy and foot pain and nocturia. He takes Gabapentin 300mg  nightly and reports he does feel relief from this. Mr Baton is a somewhat unclear historian as far as his symptoms and very unclear about exactly what medications he is taking. His symptoms are not new or different. In addition he complains of nocturia, he was seen for this back in April and given a sample of Myrbetriq and did get some relief from that, he would like a prescription for this. No changes in urinary pattern, this is an ongoing issue, No dysuria, fever, body aches, or urinary frequency.  Review of Systems  All other systems reviewed and are negative.   Relevant past medical history reviewed and updated as indicated.   Past Medical History:  Diagnosis Date   AAA (abdominal aortic aneurysm) (HCC)    Actinic keratosis    Allergy    mild    Anxiety    CKD (chronic kidney disease) stage 3, GFR 30-59 ml/min (HCC)    Diverticulosis of colon (without mention of hemorrhage)    Duodenitis without mention of hemorrhage    Esophageal reflux    Hypertrophy of prostate without urinary obstruction and other lower urinary tract symptoms (LUTS)    Left sided ulcerative (chronic) colitis (HCC)    Mononeuritis of unspecified site    Neuropathy    Nonspecific abnormal results of thyroid function study    Prostate cancer (HCC)    cryotherapy 2011 ([facility])   Schatzki's ring    Sleep apnea    does not use the CPAP   Stricture and stenosis of esophagus    Stroke (HCC)    left parietal, left parietal/occipital, right cerebellar, right pons   TIA (transient ischemic attack) 2017   pt states was told mini stroke   Unspecified essential hypertension      Past Surgical History:  Procedure  Laterality Date   ABDOMINAL AORTIC ANEURYSM REPAIR     2018   BALLOON DILATION  03/22/2011   Procedure: BALLOON DILATION;  Surgeon: Louis Meckel, MD;  Location: [facility];  Service: Endoscopy;  Laterality: N/A;  kelly/ebp   CHOLECYSTECTOMY     COLONOSCOPY     ENDOVASCULAR REPAIR/STENT GRAFT N/A 03/29/2016   Procedure: Endovascular Repair/Stent Graft;  Surgeon: Annice Needy, MD;  Location: [facility];  Service: Cardiovascular;  Laterality: N/A;   ESOPHAGOGASTRODUODENOSCOPY  03/22/2011   Procedure: ESOPHAGOGASTRODUODENOSCOPY (EGD);  Surgeon: Louis Meckel, MD;  Location: [facility];  Service: Endoscopy;  Laterality: N/A;   PROSTATE SURGERY     UPPER GASTROINTESTINAL ENDOSCOPY      Allergies and medications reviewed and updated.   Current Outpatient Medications:    mirabegron ER (MYRBETRIQ) 25 MG TB24 tablet, Take 1 tablet (25 mg total) by mouth daily., Disp: 90 tablet, Rfl: 0   aspirin EC 81 MG tablet, Take 81 mg by mouth daily. Swallow whole., Disp: , Rfl:    escitalopram (LEXAPRO) 10 MG tablet, Take 1 tablet (10 mg total) by mouth daily., Disp: 90 tablet, Rfl: 3   gabapentin (NEURONTIN) 300 MG capsule, Take 1 capsule (300 mg total) by mouth 2 (two) times daily. TAKE 1 CAPSULE (300 MG TOTAL) BY MOUTH AT BEDTIME. FOR NEUROPATHY, Disp:  120 capsule, Rfl: 0   hydrochlorothiazide (MICROZIDE) 12.5 MG capsule, TAKE 1 CAPSULE BY MOUTH EVERY DAY, Disp: 90 capsule, Rfl: 3   Methylcobalamin (B12-ACTIVE PO), Take by mouth., Disp: , Rfl:    nebivolol (BYSTOLIC) 10 MG tablet, Take 1 tablet (10 mg total) by mouth daily., Disp: 30 tablet, Rfl: 11   rosuvastatin (CRESTOR) 40 MG tablet, Take 1 tablet (40 mg total) by mouth daily., Disp: 90 tablet, Rfl: 3   solifenacin (VESICARE) 10 MG tablet, Take 1 tablet (10 mg total) by mouth daily., Disp: 90 tablet, Rfl: 3   triamcinolone cream (KENALOG) 0.1 %, APPLY TO AFFECTED AREA TWICE A DAY, Disp: 30 g, Rfl: 0  Allergies  Allergen Reactions    Metronidazole Anaphylaxis   Amoxicillin-Pot Clavulanate Other (See Comments)    UNKNOWN   Ciprofloxacin Other (See Comments)    UNKNOWN   Clindamycin/Lincomycin Other (See Comments)    Nausea, stomach pain, sweats   Mesalamine     REACTION: Intolerance    Objective:   BP 136/82   Pulse 85   Temp (!) 97.5 F (36.4 C)   Ht 6\' 3"  (1.905 m)   Wt 181 lb 9.6 oz (82.4 kg)   SpO2 95%   BMI 22.70 kg/m      07/06/2022    9:25 AM 05/08/2022   11:22 AM 05/04/2022    8:30 AM  Vitals with BMI  Height 6\' 3"  6\' 3"  6\' 3"   Weight 181 lbs 10 oz 187 lbs 185 lbs  BMI 22.7 23.37 23.12  Systolic 136 128 161  Diastolic 82 82 68  Pulse 85 78 71     Physical Exam Vitals and nursing note reviewed.  Constitutional:      Appearance: Normal appearance. He is normal weight.  HENT:     Head: Normocephalic and atraumatic.  Skin:    General: Skin is warm and dry.     Capillary Refill: Capillary refill takes less than 2 seconds.  Neurological:     General: No focal deficit present.     Mental Status: He is alert and oriented to person, place, and time. Mental status is at baseline.  Psychiatric:        Mood and Affect: Mood normal.        Behavior: Behavior normal.        Thought Content: Thought content normal.        Judgment: Judgment normal.     Assessment & Plan:  Neuropathy Assessment & Plan: Increase Gabapentin to 300mg  BID.Return to office if symptoms persist or worsen.   Overactive bladder Assessment & Plan: Chronic. Relieved with Myrbetriq sample, will order Myrbetriq 25mg  daily. Return to office if symptoms persist or worsen. Encouraged to schedule follow-up with PCP.   Other orders -     Mirabegron ER; Take 1 tablet (25 mg total) by mouth daily.  Dispense: 90 tablet; Refill: 0 -     Gabapentin; Take 1 capsule (300 mg total) by mouth 2 (two) times daily. TAKE 1 CAPSULE (300 MG TOTAL) BY MOUTH AT BEDTIME. FOR NEUROPATHY  Dispense: 120 capsule; Refill: 0     Follow up plan:  Return for follow-up with pcp.  Park Meo, FNP

## 2022-07-14 ENCOUNTER — Other Ambulatory Visit
Admission: RE | Admit: 2022-07-14 | Discharge: 2022-07-14 | Disposition: A | Discharge status: Home / Self Care | Payer: PPO | Source: Ambulatory Visit | Attending: Ophthalmology | Admitting: Ophthalmology

## 2022-07-14 ENCOUNTER — Other Ambulatory Visit: Payer: Self-pay | Admitting: Ophthalmology

## 2022-07-14 ENCOUNTER — Ambulatory Visit
Admission: RE | Admit: 2022-07-14 | Discharge: 2022-07-14 | Disposition: A | Discharge status: Home / Self Care | Payer: PPO | Source: Ambulatory Visit | Attending: Ophthalmology | Admitting: Ophthalmology

## 2022-07-14 ENCOUNTER — Ambulatory Visit: Payer: PPO | Admitting: Family Medicine

## 2022-07-14 DIAGNOSIS — H4710 Unspecified papilledema: Secondary | ICD-10-CM | POA: Insufficient documentation

## 2022-07-14 LAB — SEDIMENTATION RATE: Sed Rate: 7 mm/hr (ref 0–20)

## 2022-07-14 LAB — PLATELET COUNT: Platelets: 259 10*3/uL (ref 150–400)

## 2022-07-14 LAB — C-REACTIVE PROTEIN: CRP: 1 mg/dL — ABNORMAL HIGH (ref <1.0)

## 2022-07-14 MED ORDER — GADOBUTROL 1 MMOL/ML IV SOLN
8.0000 mL | Freq: Once | INTRAVENOUS | Status: AC | PRN
  Administered 2022-07-14: 8 mL via INTRAVENOUS

## 2022-07-17 ENCOUNTER — Ambulatory Visit (INDEPENDENT_AMBULATORY_CARE_PROVIDER_SITE_OTHER): Payer: PPO | Admitting: Family Medicine

## 2022-07-17 ENCOUNTER — Encounter: Payer: Self-pay | Admitting: Family Medicine

## 2022-07-17 VITALS — BP 156/104 | HR 75 | Temp 97.7°F | Ht 75.0 in | Wt 177.0 lb

## 2022-07-17 DIAGNOSIS — H4710 Unspecified papilledema: Secondary | ICD-10-CM

## 2022-07-17 MED ORDER — ACETAZOLAMIDE 250 MG PO TABS: 250.0000 mg | ORAL_TABLET | Freq: Two times a day (BID) | ORAL | 2 refills | Status: DC

## 2022-07-17 NOTE — Progress Notes (Signed)
Subjective:    Patient ID: Matthew Luna, male    DOB: 05-14-1940, 82 y.o.   MRN: [id number]  Patient states that he was having blurry vision all last week.  He states that everything appeared fuzzy.  He denied any headache however his blood pressure is elevated.  He has been taking his rosuvastatin and his hydrochlorothiazide.  He saw an ophthalmologist on Friday who urgently sent him for an MRI of the brain as well as an MRI of the orbit.  I reviewed the results of the MRI of the orbit and the brain which showed chronic findings but no cause for increased intracranial pressure.  The diagnosis was swelling of the optic disc/papilledema.  This would certainly raise concern for pseudotumor cerebri or intracranial hypertension.  The patient is here today continuing to complain of blurry vision.  He states that he has not heard from the ophthalmologist yet regarding the plan.  He was concerned because his blood pressure was elevated Past Medical History:  Diagnosis Date   AAA (abdominal aortic aneurysm) (HCC)    Actinic keratosis    Allergy    mild    Anxiety    CKD (chronic kidney disease) stage 3, GFR 30-59 ml/min (HCC)    Diverticulosis of colon (without mention of hemorrhage)    Duodenitis without mention of hemorrhage    Esophageal reflux    Hypertrophy of prostate without urinary obstruction and other lower urinary tract symptoms (LUTS)    Left sided ulcerative (chronic) colitis (HCC)    Mononeuritis of unspecified site    Neuropathy    Nonspecific abnormal results of thyroid function study    Prostate cancer (HCC)    cryotherapy 2011 ([facility])   Schatzki's ring    Sleep apnea    does not use the CPAP   Stricture and stenosis of esophagus    Stroke (HCC)    left parietal, left parietal/occipital, right cerebellar, right pons   TIA (transient ischemic attack) 2017   pt states was told mini stroke   Unspecified essential hypertension    Past Surgical History:  Procedure Laterality  Date   ABDOMINAL AORTIC ANEURYSM REPAIR     2018   BALLOON DILATION  03/22/2011   Procedure: BALLOON DILATION;  Surgeon: Louis Meckel, MD;  Location: [facility];  Service: Endoscopy;  Laterality: N/A;  kelly/ebp   CHOLECYSTECTOMY     COLONOSCOPY     ENDOVASCULAR REPAIR/STENT GRAFT N/A 03/29/2016   Procedure: Endovascular Repair/Stent Graft;  Surgeon: Annice Needy, MD;  Location: [facility];  Service: Cardiovascular;  Laterality: N/A;   ESOPHAGOGASTRODUODENOSCOPY  03/22/2011   Procedure: ESOPHAGOGASTRODUODENOSCOPY (EGD);  Surgeon: Louis Meckel, MD;  Location: [facility];  Service: Endoscopy;  Laterality: N/A;   PROSTATE SURGERY     UPPER GASTROINTESTINAL ENDOSCOPY     Current Outpatient Medications on File Prior to Visit  Medication Sig Dispense Refill   gabapentin (NEURONTIN) 300 MG capsule Take 1 capsule (300 mg total) by mouth 2 (two) times daily. TAKE 1 CAPSULE (300 MG TOTAL) BY MOUTH AT BEDTIME. FOR NEUROPATHY 120 capsule 0   hydrochlorothiazide (MICROZIDE) 12.5 MG capsule TAKE 1 CAPSULE BY MOUTH EVERY DAY 90 capsule 3   rosuvastatin (CRESTOR) 40 MG tablet Take 1 tablet (40 mg total) by mouth daily. 90 tablet 3   aspirin EC 81 MG tablet Take 81 mg by mouth daily. Swallow whole. (Patient not taking: Reported on 07/17/2022)     escitalopram (LEXAPRO) 10 MG tablet  Take 1 tablet (10 mg total) by mouth daily. (Patient not taking: Reported on 07/17/2022) 90 tablet 3   Methylcobalamin (B12-ACTIVE PO) Take by mouth. (Patient not taking: Reported on 07/17/2022)     mirabegron ER (MYRBETRIQ) 25 MG TB24 tablet Take 1 tablet (25 mg total) by mouth daily. (Patient not taking: Reported on 07/17/2022) 90 tablet 0   nebivolol (BYSTOLIC) 10 MG tablet Take 1 tablet (10 mg total) by mouth daily. (Patient not taking: Reported on 07/17/2022) 30 tablet 11   solifenacin (VESICARE) 10 MG tablet Take 1 tablet (10 mg total) by mouth daily. (Patient not taking: Reported on 07/17/2022) 90 tablet 3    triamcinolone cream (KENALOG) 0.1 % APPLY TO AFFECTED AREA TWICE A DAY (Patient not taking: Reported on 07/17/2022) 30 g 0   No current facility-administered medications on file prior to visit.     Allergies  Allergen Reactions   Metronidazole Anaphylaxis   Amoxicillin-Pot Clavulanate Other (See Comments)    UNKNOWN   Ciprofloxacin Other (See Comments)    UNKNOWN   Clindamycin/Lincomycin Other (See Comments)    Nausea, stomach pain, sweats   Mesalamine     REACTION: Intolerance   Social History   Socioeconomic History   Marital status: Married    Spouse name: Not on file   Number of children: 1   Years of education: Some colg   Highest education level: Not on file  Occupational History   Occupation: Retired   Tobacco Use   Smoking status: Never   Smokeless tobacco: Never  Vaping Use   Vaping Use: Never used  Substance and Sexual Activity   Alcohol use: Yes    Alcohol/week: 14.0 standard drinks of alcohol    Types: 14 Glasses of wine per week    Comment: per pt -drinks 10 oz red wine every night!   Drug use: No   Sexual activity: Never  Other Topics Concern   Not on file  Social History Narrative   1 -2 cups of coffee a day, some tea consumption    Left handed   Lives with wife. Married x 59 years in 2022.   Truck driver.   Social Determinants of Health   Financial Resource Strain: Low Risk  (02/03/2021)   Overall Financial Resource Strain (CARDIA)    Difficulty of Paying Living Expenses: Not hard at all  Food Insecurity: No Food Insecurity (02/03/2021)   Hunger Vital Sign    Worried About Running Out of Food in the Last Year: Never true    Ran Out of Food in the Last Year: Never true  Transportation Needs: No Transportation Needs (02/03/2021)   PRAPARE - Administrator, Civil Service (Medical): No    Lack of Transportation (Non-Medical): No  Physical Activity: Inactive (02/03/2021)   Exercise Vital Sign    Days of Exercise per Week: 0 days    Minutes  of Exercise per Session: 0 min  Stress: No Stress Concern Present (02/03/2021)   [employer] of Occupational Health - Occupational Stress Questionnaire    Feeling of Stress : Not at all  Social Connections: Moderately Isolated (02/03/2021)   Social Connection and Isolation Panel [NHANES]    Frequency of Communication with Friends and Family: More than three times a week    Frequency of Social Gatherings with Friends and Family: More than three times a week    Attends Religious Services: Never    Database administrator or Organizations: No    Attends [employer]  or Organization Meetings: Never    Marital Status: Married  Catering manager Violence: Not At Risk (02/03/2021)   Humiliation, Afraid, Rape, and Kick questionnaire    Fear of Current or Ex-Partner: No    Emotionally Abused: No    Physically Abused: No    Sexually Abused: No      Review of Systems  All other systems reviewed and are negative.      Objective:   Physical Exam Vitals reviewed.  Constitutional:      General: He is not in acute distress.    Appearance: He is well-developed. He is not diaphoretic.  Neck:     Thyroid: No thyromegaly.     Vascular: No JVD.  Cardiovascular:     Rate and Rhythm: Normal rate and regular rhythm.     Heart sounds: Normal heart sounds. No murmur heard.    No friction rub. No gallop.  Pulmonary:     Effort: Pulmonary effort is normal. No respiratory distress.     Breath sounds: Normal breath sounds. No wheezing or rales.  Genitourinary:    Prostate: Normal.  Lymphadenopathy:     Cervical: No cervical adenopathy.  Neurological:     Mental Status: He is alert and oriented to person, place, and time.     Motor: Motor function is intact. No tremor, atrophy, abnormal muscle tone or pronator drift.     Coordination: Coordination is intact.           Assessment & Plan:  Papilledema I am not able to appreciate papilledema however given his blurry vision and the findings of the  ophthalmologist, I am concerned about pseudotumor cerebri versus intracranial hypertension.  Begin the patient on acetazolamide 250 mg once daily.  See the patient back next week and likely uptitrate to twice daily.  Reassess in 1 week and check a BMP at that time

## 2022-07-31 ENCOUNTER — Ambulatory Visit (INDEPENDENT_AMBULATORY_CARE_PROVIDER_SITE_OTHER): Payer: PPO | Admitting: Family Medicine

## 2022-07-31 VITALS — BP 128/90 | HR 83 | Temp 97.6°F | Wt 175.6 lb

## 2022-07-31 DIAGNOSIS — R42 Dizziness and giddiness: Secondary | ICD-10-CM

## 2022-07-31 DIAGNOSIS — I672 Cerebral atherosclerosis: Secondary | ICD-10-CM

## 2022-07-31 LAB — CBC WITH DIFFERENTIAL/PLATELET
Eosinophils Absolute: 609 cells/uL — ABNORMAL HIGH (ref 15–500)
MCH: 32.2 pg (ref 27.0–33.0)
MPV: 10.1 fL (ref 7.5–12.5)

## 2022-07-31 MED ORDER — ROSUVASTATIN CALCIUM 40 MG PO TABS: 40.0000 mg | ORAL_TABLET | Freq: Every day | ORAL | 3 refills | Status: DC

## 2022-07-31 NOTE — Progress Notes (Signed)
Subjective:    Patient ID: Matthew Luna, male    DOB: 07-17-1940, 82 y.o.   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  Neurology had recommended being conservative in managing his blood pressure to ensure adequate posterior perfusion.  They were willing to accept a systolic blood pressure between 130 and 150.  I agree with this.  I am concerned that dropping his systolic blood pressure too much will cause cerebral hypoperfusion and lead to dizziness and ataxia.  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  His blood pressure here today is 142/92 and I verified this myself.  Patient is checking his blood pressure at home however he is inconsistent with regards to his self-reported blood pressure.  History is difficult to obtain at times and therefore I do not feel comfortable that he is truly checking his blood pressure every day and that it staying less than 150. At that time, my plan was:  I am willing to accept a systolic blood pressure less than 150.  However I do not want a systolic blood pressure less than 130.  Therefore of asked the patient to check his blood pressure at home every day in the morning and in the afternoon and record these values and bring it to me on Thursday or Friday.  If his systolic blood pressure is greater than 150 consistently, I will add additional medication at that time.  However if less than 150 the majority the time I would not make additional reductions in his blood pressures due to the reasons outlined above.  09/14/20 I referred the patient to neurology and they recommended keeping his systolic blood pressure around 150 if possible due to the high-grade stenosis in his posterior cerebral arteries.  Patient states that he checks his blood pressure every  morning and is typically 145-150 however he then takes his Bystolic.  He states that occasionally he then gets significant dizziness and disequilibrium and his balance is affected.  Has been bad the last few days.  He denies any vertigo.  He denies any syncope.  He denies any blurry vision or vision changes or chest pain or shortness of breath.  07/31/22 Please see my last office visit.  Patient was complaining of blurry vision and dizziness.  He saw ophthalmology who obtained an MRI due to papilledema seen on funduscopic exam.  Therefore I started the patient on acetazolamide 250 mg once daily for possible intracranial hypertension.  The patient states that he did extremely poorly on the acetazolamide.  He states that his blurry vision got worse.  He stated his dizziness was much worse.  He had several instances where he felt like he was going to pass out.  He is still taking aspirin 81 mg daily.  He has stopped rosuvastatin.  He is still taking hydrochlorothiazide.  Past Medical History:  Diagnosis Date   AAA (abdominal aortic aneurysm) (HCC)    Actinic keratosis    Allergy    mild    Anxiety    CKD (chronic kidney disease) stage 3, GFR 30-59 ml/min (HCC)    Diverticulosis of colon (without mention of hemorrhage)    Duodenitis without mention of hemorrhage    Esophageal reflux    Hypertrophy of prostate without urinary obstruction and other lower urinary tract  symptoms (LUTS)    Left sided ulcerative (chronic) colitis (HCC)    Mononeuritis of unspecified site    Neuropathy    Nonspecific abnormal results of thyroid function study    Prostate cancer (HCC)    cryotherapy 2011 ([facility])   Schatzki's ring    Sleep apnea    does not use the CPAP   Stricture and stenosis of esophagus    Stroke (HCC)    left parietal, left parietal/occipital, right cerebellar, right pons   TIA (transient ischemic attack) 2017   pt states was told mini stroke   Unspecified essential hypertension    Past Surgical  History:  Procedure Laterality Date   ABDOMINAL AORTIC ANEURYSM REPAIR     2018   BALLOON DILATION  03/22/2011   Procedure: BALLOON DILATION;  Surgeon: Louis Meckel, MD;  Location: [facility];  Service: Endoscopy;  Laterality: N/A;  kelly/ebp   CHOLECYSTECTOMY     COLONOSCOPY     ENDOVASCULAR REPAIR/STENT GRAFT N/A 03/29/2016   Procedure: Endovascular Repair/Stent Graft;  Surgeon: Annice Needy, MD;  Location: [facility];  Service: Cardiovascular;  Laterality: N/A;   ESOPHAGOGASTRODUODENOSCOPY  03/22/2011   Procedure: ESOPHAGOGASTRODUODENOSCOPY (EGD);  Surgeon: Louis Meckel, MD;  Location: [facility];  Service: Endoscopy;  Laterality: N/A;   PROSTATE SURGERY     UPPER GASTROINTESTINAL ENDOSCOPY     Current Outpatient Medications on File Prior to Visit  Medication Sig Dispense Refill   acetaZOLAMIDE (DIAMOX) 250 MG tablet Take 1 tablet (250 mg total) by mouth 2 (two) times daily. (Patient taking differently: Take 250 mg by mouth 2 (two) times daily. 1/2 tablet) 60 tablet 2   gabapentin (NEURONTIN) 300 MG capsule Take 1 capsule (300 mg total) by mouth 2 (two) times daily. TAKE 1 CAPSULE (300 MG TOTAL) BY MOUTH AT BEDTIME. FOR NEUROPATHY 120 capsule 0   hydrochlorothiazide (MICROZIDE) 12.5 MG capsule TAKE 1 CAPSULE BY MOUTH EVERY DAY 90 capsule 3   aspirin EC 81 MG tablet Take 81 mg by mouth daily. Swallow whole. (Patient not taking: Reported on 07/17/2022)     escitalopram (LEXAPRO) 10 MG tablet Take 1 tablet (10 mg total) by mouth daily. (Patient not taking: Reported on 07/17/2022) 90 tablet 3   Methylcobalamin (B12-ACTIVE PO) Take by mouth. (Patient not taking: Reported on 07/17/2022)     mirabegron ER (MYRBETRIQ) 25 MG TB24 tablet Take 1 tablet (25 mg total) by mouth daily. (Patient not taking: Reported on 07/17/2022) 90 tablet 0   nebivolol (BYSTOLIC) 10 MG tablet Take 1 tablet (10 mg total) by mouth daily. (Patient not taking: Reported on 07/17/2022) 30 tablet 11   solifenacin  (VESICARE) 10 MG tablet Take 1 tablet (10 mg total) by mouth daily. (Patient not taking: Reported on 07/17/2022) 90 tablet 3   triamcinolone cream (KENALOG) 0.1 % APPLY TO AFFECTED AREA TWICE A DAY (Patient not taking: Reported on 07/17/2022) 30 g 0   No current facility-administered medications on file prior to visit.   Allergies  Allergen Reactions   Metronidazole Anaphylaxis   Amoxicillin-Pot Clavulanate Other (See Comments)    UNKNOWN   Ciprofloxacin Other (See Comments)    UNKNOWN   Clindamycin/Lincomycin Other (See Comments)    Nausea, stomach pain, sweats   Mesalamine     REACTION: Intolerance   Social History   Socioeconomic History   Marital status: Married    Spouse name: Not on file   Number of children: 1   Years of education: Some colg  Highest education level: Not on file  Occupational History   Occupation: Retired   Tobacco Use   Smoking status: Never   Smokeless tobacco: Never  Vaping Use   Vaping Use: Never used  Substance and Sexual Activity   Alcohol use: Yes    Alcohol/week: 14.0 standard drinks of alcohol    Types: 14 Glasses of wine per week    Comment: per pt -drinks 10 oz red wine every night!   Drug use: No   Sexual activity: Never  Other Topics Concern   Not on file  Social History Narrative   1 -2 cups of coffee a day, some tea consumption    Left handed   Lives with wife. Married x 59 years in 2022.   Truck driver.   Social Determinants of Health   Financial Resource Strain: Low Risk  (02/03/2021)   Overall Financial Resource Strain (CARDIA)    Difficulty of Paying Living Expenses: Not hard at all  Food Insecurity: No Food Insecurity (02/03/2021)   Hunger Vital Sign    Worried About Running Out of Food in the Last Year: Never true    Ran Out of Food in the Last Year: Never true  Transportation Needs: No Transportation Needs (02/03/2021)   PRAPARE - Administrator, Civil Service (Medical): No    Lack of Transportation  (Non-Medical): No  Physical Activity: Inactive (02/03/2021)   Exercise Vital Sign    Days of Exercise per Week: 0 days    Minutes of Exercise per Session: 0 min  Stress: No Stress Concern Present (02/03/2021)   [employer] of Occupational Health - Occupational Stress Questionnaire    Feeling of Stress : Not at all  Social Connections: Moderately Isolated (02/03/2021)   Social Connection and Isolation Panel [NHANES]    Frequency of Communication with Friends and Family: More than three times a week    Frequency of Social Gatherings with Friends and Family: More than three times a week    Attends Religious Services: Never    Database administrator or Organizations: No    Attends Banker Meetings: Never    Marital Status: Married  Catering manager Violence: Not At Risk (02/03/2021)   Humiliation, Afraid, Rape, and Kick questionnaire    Fear of Current or Ex-Partner: No    Emotionally Abused: No    Physically Abused: No    Sexually Abused: No      Review of Systems  All other systems reviewed and are negative.      Objective:   Physical Exam Vitals reviewed.  Constitutional:      General: He is not in acute distress.    Appearance: He is well-developed. He is not diaphoretic.  Neck:     Thyroid: No thyromegaly.     Vascular: No JVD.  Cardiovascular:     Rate and Rhythm: Normal rate and regular rhythm.     Heart sounds: Normal heart sounds. No murmur heard.    No friction rub. No gallop.  Pulmonary:     Effort: Pulmonary effort is normal. No respiratory distress.     Breath sounds: Normal breath sounds. No wheezing or rales.  Lymphadenopathy:     Cervical: No cervical adenopathy.  Neurological:     Mental Status: He is alert and oriented to person, place, and time.     Motor: Motor function is intact. No tremor, atrophy, abnormal muscle tone or pronator drift.     Coordination: Coordination is intact.  Assessment & Plan:  Dizziness - Plan:  CBC with Differential/Platelet, COMPLETE METABOLIC PANEL WITH GFR  Intracranial atherosclerosis  I explained to the patient his previous medical history.  I believe that when his blood pressure is lower, he has cerebral hypoperfusion due to the high-grade stenosis in the posterior cerebral artery.  I believe that this also likely causes his dizziness and ataxia.  Therefore, I believe we need to allow permissive hypertension.    I believe that is why his symptoms worsened when I lowered his blood pressure with the acetazolamide.  Therefore I am going to have the patient discontinue hydrochlorothiazide and discontinue acetazolamide.  Only taking aspirin and rosuvastatin.  I will allow minutes of hypertension and see how patient feels in 1 week.  If his dizziness and blurry vision improves, this would point to his stenosis in the posterior circulation is the source of the symptoms.

## 2022-08-01 LAB — COMPLETE METABOLIC PANEL WITH GFR
AG Ratio: 1.5 (calc) (ref 1.0–2.5)
ALT: 7 U/L — ABNORMAL LOW (ref 9–46)
AST: 13 U/L (ref 10–35)
Albumin: 4 g/dL (ref 3.6–5.1)
Alkaline phosphatase (APISO): 84 U/L (ref 35–144)
BUN/Creatinine Ratio: 18 (calc) (ref 6–22)
BUN: 38 mg/dL — ABNORMAL HIGH (ref 7–25)
CO2: 26 mmol/L (ref 20–32)
Calcium: 9.4 mg/dL (ref 8.6–10.3)
Chloride: 108 mmol/L (ref 98–110)
Creat: 2.16 mg/dL — ABNORMAL HIGH (ref 0.70–1.22)
Globulin: 2.7 g/dL (calc) (ref 1.9–3.7)
Glucose, Bld: 74 mg/dL (ref 65–99)
Potassium: 4 mmol/L (ref 3.5–5.3)
Sodium: 142 mmol/L (ref 135–146)
Total Bilirubin: 0.9 mg/dL (ref 0.2–1.2)
Total Protein: 6.7 g/dL (ref 6.1–8.1)
eGFR: 30 mL/min/{1.73_m2} — ABNORMAL LOW (ref >=60)

## 2022-08-01 LAB — CBC WITH DIFFERENTIAL/PLATELET
Absolute Monocytes: 583 cells/uL (ref 200–950)
Basophils Absolute: 104 cells/uL (ref 0–200)
Basophils Relative: 1.2 %
Eosinophils Relative: 7 %
HCT: 43.7 % (ref 38.5–50.0)
Hemoglobin: 14.8 g/dL (ref 13.2–17.1)
Lymphs Abs: 1827 {cells}/uL (ref 850–3900)
MCHC: 33.9 g/dL (ref 32.0–36.0)
MCV: 95.2 fL (ref 80.0–100.0)
Monocytes Relative: 6.7 %
Neutro Abs: 5577 {cells}/uL (ref 1500–7800)
Neutrophils Relative %: 64.1 %
Platelets: 203 10*3/uL (ref 140–400)
RBC: 4.59 10*6/uL (ref 4.20–5.80)
RDW: 13 % (ref 11.0–15.0)
Total Lymphocyte: 21 %
WBC: 8.7 10*3/uL (ref 3.8–10.8)

## 2022-08-07 ENCOUNTER — Other Ambulatory Visit: Payer: PPO

## 2022-09-20 ENCOUNTER — Telehealth: Payer: Self-pay | Admitting: Family Medicine

## 2022-09-20 NOTE — Telephone Encounter (Signed)
Pharmacy requesting clarification of directions. Please advise pharmacist re: Gabapentin refill.

## 2022-09-21 ENCOUNTER — Other Ambulatory Visit: Payer: Self-pay

## 2022-09-21 DIAGNOSIS — G629 Polyneuropathy, unspecified: Secondary | ICD-10-CM

## 2022-09-21 MED ORDER — GABAPENTIN 300 MG PO CAPS
300.0000 mg | ORAL_CAPSULE | Freq: Two times a day (BID) | ORAL | 0 refills | Status: DC
Start: 2022-09-21 — End: 2022-11-06

## 2022-10-10 ENCOUNTER — Ambulatory Visit: Payer: PPO | Admitting: Family Medicine

## 2022-10-10 NOTE — Progress Notes (Deleted)
Subjective:    Patient ID: Matthew Luna, male    DOB: December 03, 1940, 82 y.o.   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  Neurology had recommended being conservative in managing his blood pressure to ensure adequate posterior perfusion.  They were willing to accept a systolic blood pressure between 130 and 150.  I agree with this.  I am concerned that dropping his systolic blood pressure too much will cause cerebral hypoperfusion and lead to dizziness and ataxia.  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  His blood pressure here today is 142/92 and I verified this myself.  Patient is checking his blood pressure at home however he is inconsistent with regards to his self-reported blood pressure.  History is difficult to obtain at times and therefore I do not feel comfortable that he is truly checking his blood pressure every day and that it staying less than 150. At that time, my plan was:  I am willing to accept a systolic blood pressure less than 150.  However I do not want a systolic blood pressure less than 130.  Therefore of asked the patient to check his blood pressure at home every day in the morning and in the afternoon and record these values and bring it to me on Thursday or Friday.  If his systolic blood pressure is greater than 150 consistently, I will add additional medication at that time.  However if less than 150 the majority the time I would not make additional reductions in his blood pressures due to the reasons outlined above.  09/14/20 I referred the patient to neurology and they recommended keeping his systolic blood pressure around 150 if possible due to the high-grade stenosis in his posterior cerebral arteries.  Patient states that he checks his blood pressure every  morning and is typically 145-150 however he then takes his Bystolic.  He states that occasionally he then gets significant dizziness and disequilibrium and his balance is affected.  Has been bad the last few days.  He denies any vertigo.  He denies any syncope.  He denies any blurry vision or vision changes or chest pain or shortness of breath.  07/31/22 Please see my last office visit.  Patient was complaining of blurry vision and dizziness.  He saw ophthalmology who obtained an MRI due to papilledema seen on funduscopic exam.  Therefore I started the patient on acetazolamide 250 mg once daily for possible intracranial hypertension.  The patient states that he did extremely poorly on the acetazolamide.  He states that his blurry vision got worse.  He stated his dizziness was much worse.  He had several instances where he felt like he was going to pass out.  He is still taking aspirin 81 mg daily.  He has stopped rosuvastatin.  He is still taking hydrochlorothiazide.  At that time, my plan was: I explained to the patient his previous medical history.  I believe that when his blood pressure is lower, he has cerebral hypoperfusion due to the high-grade stenosis in the posterior cerebral artery.  I believe that this also likely causes his dizziness and ataxia.  Therefore, I believe we need to allow permissive hypertension.   I believe that is why his symptoms worsened when I lowered his blood pressure with the acetazolamide.  Therefore I am going to have the patient discontinue hydrochlorothiazide and discontinue acetazolamide.  Only taking aspirin and rosuvastatin.  I will allow permissive hypertension and see how patient feels in 1 week.  If his dizziness and blurry vision improves, this would point to his stenosis in the posterior circulation is the source of the symptoms.  10/10/22   Past Medical History:  Diagnosis Date   AAA (abdominal aortic aneurysm) (HCC)    Actinic keratosis    Allergy    mild     Anxiety    CKD (chronic kidney disease) stage 3, GFR 30-59 ml/min (HCC)    Diverticulosis of colon (without mention of hemorrhage)    Duodenitis without mention of hemorrhage    Esophageal reflux    Hypertrophy of prostate without urinary obstruction and other lower urinary tract symptoms (LUTS)    Left sided ulcerative (chronic) colitis (HCC)    Mononeuritis of unspecified site    Neuropathy    Nonspecific abnormal results of thyroid function study    Prostate cancer (HCC)    cryotherapy 2011 ([facility])   Schatzki's ring    Sleep apnea    does not use the CPAP   Stricture and stenosis of esophagus    Stroke (HCC)    left parietal, left parietal/occipital, right cerebellar, right pons   TIA (transient ischemic attack) 2017   pt states was told mini stroke   Unspecified essential hypertension    Past Surgical History:  Procedure Laterality Date   ABDOMINAL AORTIC ANEURYSM REPAIR     2018   BALLOON DILATION  03/22/2011   Procedure: BALLOON DILATION;  Surgeon: Louis Meckel, MD;  Location: [facility];  Service: Endoscopy;  Laterality: N/A;  kelly/ebp   CHOLECYSTECTOMY     COLONOSCOPY     ENDOVASCULAR REPAIR/STENT GRAFT N/A 03/29/2016   Procedure: Endovascular Repair/Stent Graft;  Surgeon: Annice Needy, MD;  Location: [facility];  Service: Cardiovascular;  Laterality: N/A;   ESOPHAGOGASTRODUODENOSCOPY  03/22/2011   Procedure: ESOPHAGOGASTRODUODENOSCOPY (EGD);  Surgeon: Louis Meckel, MD;  Location: [facility];  Service: Endoscopy;  Laterality: N/A;   PROSTATE SURGERY     UPPER GASTROINTESTINAL ENDOSCOPY     Current Outpatient Medications on File Prior to Visit  Medication Sig Dispense Refill   aspirin EC 81 MG tablet Take 81 mg by mouth daily. Swallow whole.     gabapentin (NEURONTIN) 300 MG capsule Take 1 capsule (300 mg total) by mouth 2 (two) times daily. 120 capsule 0   hydrochlorothiazide (MICROZIDE) 12.5 MG capsule TAKE 1 CAPSULE BY MOUTH EVERY DAY (Patient not  taking: Reported on 07/31/2022) 90 capsule 3   rosuvastatin (CRESTOR) 40 MG tablet Take 1 tablet (40 mg total) by mouth daily. 90 tablet 3   No current facility-administered medications on file prior to visit.   Allergies  Allergen Reactions   Metronidazole Anaphylaxis   Amoxicillin-Pot Clavulanate Other (See Comments)    UNKNOWN   Ciprofloxacin Other (See Comments)    UNKNOWN   Clindamycin/Lincomycin Other (See Comments)    Nausea, stomach pain, sweats   Mesalamine     REACTION: Intolerance   Social History   Socioeconomic History   Marital status: Married    Spouse name: Not on file   Number of children: 1   Years of education: Some colg   Highest education level: Not on file  Occupational History   Occupation: Retired   Tobacco Use   Smoking status: Never   Smokeless tobacco: Never  Vaping Use   Vaping status: Never Used  Substance and Sexual Activity   Alcohol use: Yes    Alcohol/week: 14.0 standard drinks of alcohol    Types: 14 Glasses of wine per week    Comment: per pt -drinks 10 oz red wine every night!   Drug use: No   Sexual activity: Never  Other Topics Concern   Not on file  Social History Narrative   1 -2 cups of coffee a day, some tea consumption    Left handed   Lives with wife. Married x 59 years in 2022.   Truck driver.   Social Determinants of Health   Financial Resource Strain: Low Risk  (02/03/2021)   Overall Financial Resource Strain (CARDIA)    Difficulty of Paying Living Expenses: Not hard at all  Food Insecurity: No Food Insecurity (02/03/2021)   Hunger Vital Sign    Worried About Running Out of Food in the Last Year: Never true    Ran Out of Food in the Last Year: Never true  Transportation Needs: No Transportation Needs (02/03/2021)   PRAPARE - Administrator, Civil Service (Medical): No    Lack of Transportation (Non-Medical): No  Physical Activity: Inactive (02/03/2021)   Exercise Vital Sign    Days of Exercise per Week: 0  days    Minutes of Exercise per Session: 0 min  Stress: No Stress Concern Present (02/03/2021)   [employer] of Occupational Health - Occupational Stress Questionnaire    Feeling of Stress : Not at all  Social Connections: Moderately Isolated (02/03/2021)   Social Connection and Isolation Panel [NHANES]    Frequency of Communication with Friends and Family: More than three times a week    Frequency of Social Gatherings with Friends and Family: More than three times a week    Attends Religious Services: Never    Database administrator or Organizations: No    Attends Banker Meetings: Never    Marital Status: Married  Catering manager Violence: Not At Risk (02/03/2021)   Humiliation, Afraid, Rape, and Kick questionnaire    Fear of Current or Ex-Partner: No    Emotionally Abused: No    Physically Abused: No    Sexually Abused: No      Review of Systems  All other systems reviewed and are negative.      Objective:   Physical Exam Vitals reviewed.  Constitutional:      General: He is not in acute distress.    Appearance: He is well-developed. He is not diaphoretic.  Neck:     Thyroid: No thyromegaly.     Vascular: No JVD.  Cardiovascular:     Rate and Rhythm: Normal rate and regular rhythm.     Heart sounds: Normal heart sounds. No murmur heard.    No friction rub. No gallop.  Pulmonary:     Effort: Pulmonary effort is normal. No respiratory distress.     Breath sounds: Normal breath sounds. No wheezing or rales.  Lymphadenopathy:     Cervical: No cervical adenopathy.  Neurological:     Mental Status: He is alert and oriented to person, place, and time.     Motor: Motor function is intact. No tremor, atrophy, abnormal muscle tone or pronator drift.     Coordination: Coordination is intact.           Assessment & Plan:

## 2022-10-11 ENCOUNTER — Other Ambulatory Visit: Payer: Self-pay | Admitting: Family Medicine

## 2022-10-12 NOTE — Telephone Encounter (Signed)
Diamox 250 mg refused because it was discontinued on 07/31/2022 by Dr. Tanya Nones.

## 2022-11-02 ENCOUNTER — Other Ambulatory Visit: Payer: Self-pay | Admitting: Family Medicine

## 2022-11-03 NOTE — Telephone Encounter (Signed)
Requested medication (s) are due for refill today:   Discontinued 07/31/2022  Requested medication (s) are on the active medication list:     Future visit scheduled:      Last ordered: Discontinued 07/31/2022  Returned because it's a non delegated refill.      Requested Prescriptions  Pending Prescriptions Disp Refills   triamcinolone cream (KENALOG) 0.1 % [Pharmacy Med Name: TRIAMCINOLONE 0.1% CREAM] 30 g 0    Sig: APPLY TO AFFECTED AREA TWICE A DAY     Not Delegated - Dermatology:  Corticosteroids Failed - 11/02/2022  7:04 PM      Failed - This refill cannot be delegated      Failed - Valid encounter within last 12 months    Recent Outpatient Visits           1 year ago Benign essential HTN   [facility] Donita Brooks, MD   1 year ago Cerebrovascular accident (CVA) due to stenosis of small artery (HCC)   [facility] Donita Brooks, MD   2 years ago Cerebrovascular accident (CVA) due to stenosis of small artery (HCC)   [facility] Donita Brooks, MD   2 years ago Intracranial atherosclerosis   [facility] Tanya Nones Priscille Heidelberg, MD   2 years ago Actinic keratosis   [facility] [place], Priscille Heidelberg, MD

## 2022-11-06 ENCOUNTER — Encounter: Payer: Self-pay | Admitting: Family Medicine

## 2022-11-06 ENCOUNTER — Ambulatory Visit (INDEPENDENT_AMBULATORY_CARE_PROVIDER_SITE_OTHER): Payer: PPO | Admitting: Family Medicine

## 2022-11-06 VITALS — BP 115/80 | HR 59 | Temp 97.6°F | Ht 75.0 in | Wt 176.0 lb

## 2022-11-06 DIAGNOSIS — R351 Nocturia: Secondary | ICD-10-CM

## 2022-11-06 DIAGNOSIS — I10 Essential (primary) hypertension: Secondary | ICD-10-CM

## 2022-11-06 LAB — URINALYSIS, ROUTINE W REFLEX MICROSCOPIC
Bacteria, UA: NONE SEEN /[HPF]
Bilirubin Urine: NEGATIVE
Glucose, UA: NEGATIVE
Hgb urine dipstick: NEGATIVE
Hyaline Cast: NONE SEEN /[LPF]
Ketones, ur: NEGATIVE
Nitrite: NEGATIVE
RBC / HPF: NONE SEEN /[HPF] (ref 0–2)
Specific Gravity, Urine: 1.02 (ref 1.001–1.035)
Squamous Epithelial / HPF: NONE SEEN /[HPF] (ref <=5)
pH: 5.5 (ref 5.0–8.0)

## 2022-11-06 LAB — MICROSCOPIC MESSAGE

## 2022-11-06 MED ORDER — MIRABEGRON ER 25 MG PO TB24: 25.0000 mg | ORAL_TABLET | Freq: Every day | ORAL | 3 refills | Status: DC

## 2022-11-06 MED ORDER — GABAPENTIN 300 MG PO CAPS: 300.0000 mg | ORAL_CAPSULE | Freq: Two times a day (BID) | ORAL | 0 refills | Status: DC

## 2022-11-06 NOTE — Assessment & Plan Note (Addendum)
Will provide order for Myrbetriq 25mg  daily per PCP original plan, he has not trialed this yet. May increase to 50mg  daily if ineffective. UA negative. Labs ordered today and pt will return for annual physical with PCP.

## 2022-11-06 NOTE — Progress Notes (Signed)
Subjective:  HPI: Matthew Luna is a 82 y.o. male presenting on 11/06/2022 for Acute Visit (frequent urination/slj//Also request to have cholesterol check its been a little bit//)   HPI Patient is in today for nocturia. He recently received samples of Myrbetriq 25mg  daily but has lost his sample medications and requests a refill today. He was unable to start this to evaluate effectiveness. He is voiding 5-6 times at night. Denies fever, chills, body aches, dysuria, hesitancy, difficulty starting or maintaining a stream, hematuria, flank pain.  He is unable to void in office today. He would also like labs drawn today.  Review of Systems  All other systems reviewed and are negative.   Relevant past medical history reviewed and updated as indicated.   Past Medical History:  Diagnosis Date   AAA (abdominal aortic aneurysm) (HCC)    Actinic keratosis    Allergy    mild    Anxiety    CKD (chronic kidney disease) stage 3, GFR 30-59 ml/min (HCC)    Diverticulosis of colon (without mention of hemorrhage)    Duodenitis without mention of hemorrhage    Esophageal reflux    Hypertrophy of prostate without urinary obstruction and other lower urinary tract symptoms (LUTS)    Left sided ulcerative (chronic) colitis (HCC)    Mononeuritis of unspecified site    Neuropathy    Nonspecific abnormal results of thyroid function study    Prostate cancer (HCC)    cryotherapy 2011 ([facility])   Schatzki's ring    Sleep apnea    does not use the CPAP   Stricture and stenosis of esophagus    Stroke (HCC)    left parietal, left parietal/occipital, right cerebellar, right pons   TIA (transient ischemic attack) 2017   pt states was told mini stroke   Unspecified essential hypertension      Past Surgical History:  Procedure Laterality Date   ABDOMINAL AORTIC ANEURYSM REPAIR     2018   BALLOON DILATION  03/22/2011   Procedure: BALLOON DILATION;  Surgeon: Louis Meckel, MD;  Location: [facility];   Service: Endoscopy;  Laterality: N/A;  kelly/ebp   CHOLECYSTECTOMY     COLONOSCOPY     ENDOVASCULAR REPAIR/STENT GRAFT N/A 03/29/2016   Procedure: Endovascular Repair/Stent Graft;  Surgeon: Annice Needy, MD;  Location: [facility];  Service: Cardiovascular;  Laterality: N/A;   ESOPHAGOGASTRODUODENOSCOPY  03/22/2011   Procedure: ESOPHAGOGASTRODUODENOSCOPY (EGD);  Surgeon: Louis Meckel, MD;  Location: [facility];  Service: Endoscopy;  Laterality: N/A;   PROSTATE SURGERY     UPPER GASTROINTESTINAL ENDOSCOPY      Allergies and medications reviewed and updated.   Current Outpatient Medications:    aspirin EC 81 MG tablet, Take 81 mg by mouth daily. Swallow whole., Disp: , Rfl:    hydrochlorothiazide (MICROZIDE) 12.5 MG capsule, TAKE 1 CAPSULE BY MOUTH EVERY DAY, Disp: 90 capsule, Rfl: 3   mirabegron ER (MYRBETRIQ) 25 MG TB24 tablet, Take 1 tablet (25 mg total) by mouth daily., Disp: 90 tablet, Rfl: 3   rosuvastatin (CRESTOR) 40 MG tablet, Take 1 tablet (40 mg total) by mouth daily., Disp: 90 tablet, Rfl: 3   gabapentin (NEURONTIN) 300 MG capsule, Take 1 capsule (300 mg total) by mouth 2 (two) times daily., Disp: 120 capsule, Rfl: 0  Allergies  Allergen Reactions   Metronidazole Anaphylaxis   Amoxicillin-Pot Clavulanate Other (See Comments)    UNKNOWN   Ciprofloxacin Other (See Comments)    UNKNOWN  Clindamycin/Lincomycin Other (See Comments)    Nausea, stomach pain, sweats   Mesalamine     REACTION: Intolerance    Objective:   BP 115/80   Pulse (!) 59   Temp 97.6 F (36.4 C) (Oral)   Ht 6\' 3"  (1.905 m)   Wt 176 lb (79.8 kg)   SpO2 96%   BMI 22.00 kg/m      11/06/2022   11:59 AM 07/31/2022   12:37 PM 07/17/2022   10:37 AM  Vitals with BMI  Height 6\' 3"   6\' 3"   Weight 176 lbs 175 lbs 10 oz 177 lbs  BMI 22 21.95 22.12  Systolic 115 128 829  Diastolic 80 90 104  Pulse 59 83 75     Physical Exam Vitals and nursing note reviewed.  Constitutional:       Appearance: Normal appearance. He is normal weight.  HENT:     Head: Normocephalic and atraumatic.  Skin:    General: Skin is warm and dry.     Capillary Refill: Capillary refill takes less than 2 seconds.  Neurological:     General: No focal deficit present.     Mental Status: He is alert and oriented to person, place, and time. Mental status is at baseline.  Psychiatric:        Mood and Affect: Mood normal.        Behavior: Behavior normal.        Thought Content: Thought content normal.        Judgment: Judgment normal.     Assessment & Plan:  Nocturia Assessment & Plan: Will provide order for Myrbetriq 25mg  daily per PCP original plan, he has not trialed this yet. May increase to 50mg  daily if ineffective. UA negative. Labs ordered today and pt will return for annual physical with PCP.   Orders: -     Urinalysis, Routine w reflex microscopic  Benign essential HTN -     CBC with Differential/Platelet -     COMPLETE METABOLIC PANEL WITH GFR -     Lipid panel  Other orders -     Gabapentin; Take 1 capsule (300 mg total) by mouth 2 (two) times daily.  Dispense: 120 capsule; Refill: 0 -     Mirabegron ER; Take 1 tablet (25 mg total) by mouth daily.  Dispense: 90 tablet; Refill: 3 -     Microscopic Message     Follow up plan: Return for chronic follow-up with labs 1 week prior.  Park Meo, FNP

## 2022-11-07 LAB — CBC WITH DIFFERENTIAL/PLATELET
Absolute Monocytes: 490 {cells}/uL (ref 200–950)
Basophils Absolute: 98 {cells}/uL (ref 0–200)
Basophils Relative: 1.1 %
Eosinophils Absolute: 1139 {cells}/uL — ABNORMAL HIGH (ref 15–500)
Eosinophils Relative: 12.8 %
HCT: 42.6 % (ref 38.5–50.0)
Hemoglobin: 14 g/dL (ref 13.2–17.1)
Lymphs Abs: 1985 {cells}/uL (ref 850–3900)
MCH: 31.8 pg (ref 27.0–33.0)
MCHC: 32.9 g/dL (ref 32.0–36.0)
MCV: 96.8 fL (ref 80.0–100.0)
MPV: 10.2 fL (ref 7.5–12.5)
Monocytes Relative: 5.5 %
Neutro Abs: 5189 {cells}/uL (ref 1500–7800)
Neutrophils Relative %: 58.3 %
Platelets: 198 10*3/uL (ref 140–400)
RBC: 4.4 10*6/uL (ref 4.20–5.80)
RDW: 13.4 % (ref 11.0–15.0)
Total Lymphocyte: 22.3 %
WBC: 8.9 10*3/uL (ref 3.8–10.8)

## 2022-11-07 LAB — COMPLETE METABOLIC PANEL WITH GFR
AG Ratio: 1.6 (calc) (ref 1.0–2.5)
ALT: 7 U/L — ABNORMAL LOW (ref 9–46)
AST: 14 U/L (ref 10–35)
Albumin: 3.9 g/dL (ref 3.6–5.1)
Alkaline phosphatase (APISO): 73 U/L (ref 35–144)
BUN/Creatinine Ratio: 18 (calc) (ref 6–22)
BUN: 31 mg/dL — ABNORMAL HIGH (ref 7–25)
CO2: 27 mmol/L (ref 20–32)
Calcium: 9.6 mg/dL (ref 8.6–10.3)
Chloride: 107 mmol/L (ref 98–110)
Creat: 1.72 mg/dL — ABNORMAL HIGH (ref 0.70–1.22)
Globulin: 2.5 g/dL (ref 1.9–3.7)
Glucose, Bld: 94 mg/dL (ref 65–99)
Potassium: 5 mmol/L (ref 3.5–5.3)
Sodium: 144 mmol/L (ref 135–146)
Total Bilirubin: 0.7 mg/dL (ref 0.2–1.2)
Total Protein: 6.4 g/dL (ref 6.1–8.1)
eGFR: 39 mL/min/{1.73_m2} — ABNORMAL LOW (ref >=60)

## 2022-11-07 LAB — LIPID PANEL
Cholesterol: 135 mg/dL (ref <200)
HDL: 45 mg/dL (ref >=40)
LDL Cholesterol (Calc): 73 mg/dL
Non-HDL Cholesterol (Calc): 90 mg/dL (ref <130)
Total CHOL/HDL Ratio: 3 (calc) (ref <5.0)
Triglycerides: 88 mg/dL (ref <150)

## 2022-11-09 ENCOUNTER — Encounter: Payer: Self-pay | Admitting: Family Medicine

## 2022-11-09 ENCOUNTER — Ambulatory Visit (INDEPENDENT_AMBULATORY_CARE_PROVIDER_SITE_OTHER): Payer: PPO | Admitting: Family Medicine

## 2022-11-09 VITALS — BP 132/86 | HR 72 | Temp 98.2°F | Ht 75.0 in | Wt 180.0 lb

## 2022-11-09 DIAGNOSIS — I10 Essential (primary) hypertension: Secondary | ICD-10-CM

## 2022-11-09 DIAGNOSIS — N1832 Chronic kidney disease, stage 3b: Secondary | ICD-10-CM

## 2022-11-09 DIAGNOSIS — I672 Cerebral atherosclerosis: Secondary | ICD-10-CM

## 2022-11-09 DIAGNOSIS — L989 Disorder of the skin and subcutaneous tissue, unspecified: Secondary | ICD-10-CM

## 2022-11-09 DIAGNOSIS — D485 Neoplasm of uncertain behavior of skin: Secondary | ICD-10-CM

## 2022-11-09 NOTE — Progress Notes (Signed)
Subjective:    Patient ID: Matthew Luna, male    DOB: Dec 15, 1940, 82 y.o.   MRN: [id number]  Please see my last office visit.  The patient has a history of a partially occluded left posterior cerebral artery.  Neurology has allow permissive hypertension with a systolic blood pressure between 130 and 150 to avoid dizziness, near syncope, and vision changes. Please see the MRA report from 2022 below. Posterior circulation: Both vertebral arteries are patent to the basilar. Moderate stenosis of the distal left vertebral artery. No basilar stenosis. Flow is present in both superior cerebellar arteries. Both posterior cerebral arteries are severely diseased. On the left, there are serial stenoses, most severe at the P1 P2 junction. On the right, there appears to be occlusion or near occlusion of the P1 segment, but there is a patent posterior communicating artery giving supply to a small and irregular more distal right PCA. He is here today for checkup.  He has a history of chronic kidney disease with a baseline creatinine between 1.7 and 1.9.  He has a history of high blood pressure but we are excepting blood pressures between 130 and 150 systolic for the reasons outlined above.  He also has a history of hyperlipidemia for which she is on rosuvastatin.  He also takes gabapentin for nerve pain in his feet.  He denies any chest pain shortness of breath or dyspnea on exertion.  His most recent lab work is listed below Office Visit on 11/06/2022  Component Date Value Ref Range Status   WBC 11/06/2022 8.9  3.8 - 10.8 Thousand/uL Final   RBC 11/06/2022 4.40  4.20 - 5.80 Million/uL Final   Hemoglobin 11/06/2022 14.0  13.2 - 17.1 g/dL Final   HCT 29/52/8413 42.6  38.5 - 50.0 % Final   MCV 11/06/2022 96.8  80.0 - 100.0 fL Final   MCH 11/06/2022 31.8  27.0 - 33.0 pg Final   MCHC 11/06/2022 32.9  32.0 - 36.0 g/dL Final   Comment: For adults, a slight decrease in the calculated MCHC value (in the range  of 30 to 32 g/dL) is most likely not clinically significant; however, it should be interpreted with caution in correlation with other red cell parameters and the patient's clinical condition.    RDW 11/06/2022 13.4  11.0 - 15.0 % Final   Platelets 11/06/2022 198  140 - 400 Thousand/uL Final   MPV 11/06/2022 10.2  7.5 - 12.5 fL Final   Neutro Abs 11/06/2022 5,189  1,500 - 7,800 cells/uL Final   Lymphs Abs 11/06/2022 1,985  850 - 3,900 cells/uL Final   Absolute Monocytes 11/06/2022 490  200 - 950 cells/uL Final   Eosinophils Absolute 11/06/2022 1,139 (H)  15 - 500 cells/uL Final   Basophils Absolute 11/06/2022 98  0 - 200 cells/uL Final   Neutrophils Relative % 11/06/2022 58.3  % Final   Total Lymphocyte 11/06/2022 22.3  % Final   Monocytes Relative 11/06/2022 5.5  % Final   Eosinophils Relative 11/06/2022 12.8  % Final   Basophils Relative 11/06/2022 1.1  % Final   Glucose, Bld 11/06/2022 94  65 - 99 mg/dL Final   Comment: .            Fasting reference interval .    BUN 11/06/2022 31 (H)  7 - 25 mg/dL Final   Creat 24/40/1027 1.72 (H)  0.70 - 1.22 mg/dL Final   eGFR 25/36/6440 39 (L)  > OR = 60 mL/min/1.14m2 Final  BUN/Creatinine Ratio 11/06/2022 18  6 - 22 (calc) Final   Sodium 11/06/2022 144  135 - 146 mmol/L Final   Potassium 11/06/2022 5.0  3.5 - 5.3 mmol/L Final   Chloride 11/06/2022 107  98 - 110 mmol/L Final   CO2 11/06/2022 27  20 - 32 mmol/L Final   Calcium 11/06/2022 9.6  8.6 - 10.3 mg/dL Final   Total Protein 16/10/9602 6.4  6.1 - 8.1 g/dL Final   Albumin 54/09/8117 3.9  3.6 - 5.1 g/dL Final   Globulin 14/78/2956 2.5  1.9 - 3.7 g/dL (calc) Final   AG Ratio 11/06/2022 1.6  1.0 - 2.5 (calc) Final   Total Bilirubin 11/06/2022 0.7  0.2 - 1.2 mg/dL Final   Alkaline phosphatase (APISO) 11/06/2022 73  35 - 144 U/L Final   AST 11/06/2022 14  10 - 35 U/L Final   ALT 11/06/2022 7 (L)  9 - 46 U/L Final   Cholesterol 11/06/2022 135  <200 mg/dL Final   HDL 21/30/8657 45  > OR  = 40 mg/dL Final   Triglycerides 84/69/6295 88  <150 mg/dL Final   LDL Cholesterol (Calc) 11/06/2022 73  mg/dL (calc) Final   Comment: Reference range: <100 . Desirable range <100 mg/dL for primary prevention;   <70 mg/dL for patients with CHD or diabetic patients  with > or = 2 CHD risk factors. Marland Kitchen LDL-C is now calculated using the Martin-Hopkins  calculation, which is a validated novel method providing  better accuracy than the Friedewald equation in the  estimation of LDL-C.  Horald Pollen et al. Lenox Ahr. 2841;324(40): 2061-2068  (http://education.[facility].com/faq/FAQ164)    Total CHOL/HDL Ratio 11/06/2022 3.0  <1.0 (calc) Final   Non-HDL Cholesterol (Calc) 11/06/2022 90  <130 mg/dL (calc) Final   Comment: For patients with diabetes plus 1 major ASCVD risk  factor, treating to a non-HDL-C goal of <100 mg/dL  (LDL-C of <27 mg/dL) is considered a therapeutic  option.    Color, Urine 11/06/2022 YELLOW  YELLOW Final   APPearance 11/06/2022 CLEAR  CLEAR Final   Specific Gravity, Urine 11/06/2022 1.020  1.001 - 1.035 Final   pH 11/06/2022 5.5  5.0 - 8.0 Final   Glucose, UA 11/06/2022 NEGATIVE  NEGATIVE Final   Bilirubin Urine 11/06/2022 NEGATIVE  NEGATIVE Final   Ketones, ur 11/06/2022 NEGATIVE  NEGATIVE Final   Hgb urine dipstick 11/06/2022 NEGATIVE  NEGATIVE Final   Protein, ur 11/06/2022 TRACE (A)  NEGATIVE Final   Nitrite 11/06/2022 NEGATIVE  NEGATIVE Final   Leukocytes,Ua 11/06/2022 TRACE (A)  NEGATIVE Final   WBC, UA 11/06/2022 0-5  0 - 5 /HPF Final   RBC / HPF 11/06/2022 NONE SEEN  0 - 2 /HPF Final   Squamous Epithelial / HPF 11/06/2022 NONE SEEN  < OR = 5 /HPF Final   Bacteria, UA 11/06/2022 NONE SEEN  NONE SEEN /HPF Final   Hyaline Cast 11/06/2022 NONE SEEN  NONE SEEN /LPF Final   Note 11/06/2022    Final   Comment: This urine was analyzed for the presence of WBC,  RBC, bacteria, casts, and other formed elements.  Only those elements seen were reported. . .      Past Medical History:  Diagnosis Date   AAA (abdominal aortic aneurysm) (HCC)    Actinic keratosis    Allergy    mild    Anxiety    CKD (chronic kidney disease) stage 3, GFR 30-59 ml/min (HCC)    Diverticulosis of colon (without mention of hemorrhage)  Duodenitis without mention of hemorrhage    Esophageal reflux    Hypertrophy of prostate without urinary obstruction and other lower urinary tract symptoms (LUTS)    Left sided ulcerative (chronic) colitis (HCC)    Mononeuritis of unspecified site    Neuropathy    Nonspecific abnormal results of thyroid function study    Prostate cancer (HCC)    cryotherapy 2011 ([facility])   Schatzki's ring    Sleep apnea    does not use the CPAP   Stricture and stenosis of esophagus    Stroke (HCC)    left parietal, left parietal/occipital, right cerebellar, right pons   TIA (transient ischemic attack) 2017   pt states was told mini stroke   Unspecified essential hypertension    Past Surgical History:  Procedure Laterality Date   ABDOMINAL AORTIC ANEURYSM REPAIR     2018   BALLOON DILATION  03/22/2011   Procedure: BALLOON DILATION;  Surgeon: Louis Meckel, MD;  Location: [facility];  Service: Endoscopy;  Laterality: N/A;  kelly/ebp   CHOLECYSTECTOMY     COLONOSCOPY     ENDOVASCULAR REPAIR/STENT GRAFT N/A 03/29/2016   Procedure: Endovascular Repair/Stent Graft;  Surgeon: Annice Needy, MD;  Location: [facility];  Service: Cardiovascular;  Laterality: N/A;   ESOPHAGOGASTRODUODENOSCOPY  03/22/2011   Procedure: ESOPHAGOGASTRODUODENOSCOPY (EGD);  Surgeon: Louis Meckel, MD;  Location: [facility];  Service: Endoscopy;  Laterality: N/A;   PROSTATE SURGERY     UPPER GASTROINTESTINAL ENDOSCOPY     Current Outpatient Medications on File Prior to Visit  Medication Sig Dispense Refill   aspirin EC 81 MG tablet Take 81 mg by mouth daily. Swallow whole.     gabapentin (NEURONTIN) 300 MG capsule Take 1 capsule (300 mg total) by mouth 2  (two) times daily. 120 capsule 0   hydrochlorothiazide (MICROZIDE) 12.5 MG capsule TAKE 1 CAPSULE BY MOUTH EVERY DAY 90 capsule 3   mirabegron ER (MYRBETRIQ) 25 MG TB24 tablet Take 1 tablet (25 mg total) by mouth daily. 90 tablet 3   rosuvastatin (CRESTOR) 40 MG tablet Take 1 tablet (40 mg total) by mouth daily. 90 tablet 3   No current facility-administered medications on file prior to visit.   Allergies  Allergen Reactions   Metronidazole Anaphylaxis   Amoxicillin-Pot Clavulanate Other (See Comments)    UNKNOWN   Ciprofloxacin Other (See Comments)    UNKNOWN   Clindamycin/Lincomycin Other (See Comments)    Nausea, stomach pain, sweats   Mesalamine     REACTION: Intolerance   Social History   Socioeconomic History   Marital status: Married    Spouse name: Not on file   Number of children: 1   Years of education: Some colg   Highest education level: Not on file  Occupational History   Occupation: Retired   Tobacco Use   Smoking status: Never   Smokeless tobacco: Never  Vaping Use   Vaping status: Never Used  Substance and Sexual Activity   Alcohol use: Yes    Alcohol/week: 14.0 standard drinks of alcohol    Types: 14 Glasses of wine per week    Comment: per pt -drinks 10 oz red wine every night!   Drug use: No   Sexual activity: Never  Other Topics Concern   Not on file  Social History Narrative   1 -2 cups of coffee a day, some tea consumption    Left handed   Lives with wife. Married x 59 years in 2022.  Truck driver.   Social Determinants of Health   Financial Resource Strain: Low Risk  (02/03/2021)   Overall Financial Resource Strain (CARDIA)    Difficulty of Paying Living Expenses: Not hard at all  Food Insecurity: No Food Insecurity (02/03/2021)   Hunger Vital Sign    Worried About Running Out of Food in the Last Year: Never true    Ran Out of Food in the Last Year: Never true  Transportation Needs: No Transportation Needs (02/03/2021)   PRAPARE -  Administrator, Civil Service (Medical): No    Lack of Transportation (Non-Medical): No  Physical Activity: Inactive (02/03/2021)   Exercise Vital Sign    Days of Exercise per Week: 0 days    Minutes of Exercise per Session: 0 min  Stress: No Stress Concern Present (02/03/2021)   [employer] of Occupational Health - Occupational Stress Questionnaire    Feeling of Stress : Not at all  Social Connections: Moderately Isolated (02/03/2021)   Social Connection and Isolation Panel [NHANES]    Frequency of Communication with Friends and Family: More than three times a week    Frequency of Social Gatherings with Friends and Family: More than three times a week    Attends Religious Services: Never    Database administrator or Organizations: No    Attends Banker Meetings: Never    Marital Status: Married  Catering manager Violence: Not At Risk (02/03/2021)   Humiliation, Afraid, Rape, and Kick questionnaire    Fear of Current or Ex-Partner: No    Emotionally Abused: No    Physically Abused: No    Sexually Abused: No      Review of Systems  All other systems reviewed and are negative.      Objective:   Physical Exam Vitals reviewed.  Constitutional:      General: He is not in acute distress.    Appearance: He is well-developed. He is not diaphoretic.  HENT:     Head:   Neck:     Thyroid: No thyromegaly.     Vascular: No JVD.  Cardiovascular:     Rate and Rhythm: Normal rate and regular rhythm.     Heart sounds: Normal heart sounds. No murmur heard.    No friction rub. No gallop.  Pulmonary:     Effort: Pulmonary effort is normal. No respiratory distress.     Breath sounds: Normal breath sounds. No wheezing or rales.  Lymphadenopathy:     Cervical: No cervical adenopathy.  Neurological:     Mental Status: He is alert and oriented to person, place, and time.     Motor: Motor function is intact. No tremor, atrophy, abnormal muscle tone or pronator  drift.     Coordination: Coordination is intact.    Just below his left eye is a 4 mm erythematous papule with telangiectasias and superficial ulceration.  He states that that has been there for months and it is getting bigger.  This raises concern for possible skin cancer.       Assessment & Plan:  Skin lesion of face - Plan: Pathology Report ([facility])  Benign essential HTN  Intracranial atherosclerosis  Stage 3b chronic kidney disease (HCC) I am happy with his blood pressure today.  His renal function is stable.  His cholesterol is acceptable.  Therefore I will make no changes in his medication.  I would like to keep his blood pressure between 130 and 150 systolic as outlined above.  I am concerned that the lesion on his left cheek could be a squamous cell carcinoma.  Patient requested that I biopsied this lesion for him.  I anesthetized the lesion with 0.1% lidocaine with epinephrine.  I then performed a shave biopsy and sent the lesion to pathology in a labeled container.  Hemostasis was achieved with Drysol and a Band-Aid.  The patient tolerated the procedure without complication

## 2022-11-13 LAB — TISSUE SPECIMEN

## 2022-11-13 LAB — PATHOLOGY REPORT

## 2022-11-17 ENCOUNTER — Other Ambulatory Visit: Payer: Self-pay

## 2022-11-17 DIAGNOSIS — L989 Disorder of the skin and subcutaneous tissue, unspecified: Secondary | ICD-10-CM

## 2023-01-03 ENCOUNTER — Telehealth: Payer: Self-pay | Admitting: Family Medicine

## 2023-01-03 ENCOUNTER — Encounter: Payer: Self-pay | Admitting: Family Medicine

## 2023-01-03 ENCOUNTER — Ambulatory Visit: Payer: PPO | Admitting: Family Medicine

## 2023-01-03 VITALS — BP 138/86 | HR 77 | Temp 98.7°F | Ht 75.0 in | Wt 181.0 lb

## 2023-01-03 DIAGNOSIS — N529 Male erectile dysfunction, unspecified: Secondary | ICD-10-CM | POA: Diagnosis not present

## 2023-01-03 DIAGNOSIS — G629 Polyneuropathy, unspecified: Secondary | ICD-10-CM | POA: Diagnosis not present

## 2023-01-03 MED ORDER — SILDENAFIL CITRATE 25 MG PO TABS: 25.0000 mg | ORAL_TABLET | Freq: Every day | ORAL | 1 refills | Status: DC | PRN

## 2023-01-03 MED ORDER — GABAPENTIN 300 MG PO CAPS: ORAL_CAPSULE | ORAL | 0 refills | Status: DC

## 2023-01-03 NOTE — Progress Notes (Signed)
Subjective:  HPI: Matthew Luna is a 82 y.o. male presenting on 01/03/2023 for Follow-up (Having trouble with neuropathy in bilateral feet. )   HPI Patient is in today for cholesterol screening and neuropathy unrelieved on Gabapentin 300mg  BID. Pain is described as burning in the bottom of his feet with numbness and tingling in his right foot. No swelling, redness, warmth. Pain is worse at night when he is trying to sleep. He is also using a cream he cannot recall the name.  In addition Matthew Luna medication to help him with sexual performance. He reports he is unable to obtain an erection. Has been having trouble with erections for many years and has discussed this with Urology previously. Not able to achieve a full erection. Married to wife and is interested in intercourse. Interested in PDE5 inhibitors and still has a libido. Never had a heart attack but has had a mini stroke, BP at goal. No blood thinners. No nitrates.   Review of Systems  All other systems reviewed and are negative.   Relevant past medical history reviewed and updated as indicated.   Past Medical History:  Diagnosis Date   AAA (abdominal aortic aneurysm) (HCC)    Actinic keratosis    Allergy    mild    Anxiety    CKD (chronic kidney disease) stage 3, GFR 30-59 ml/min (HCC)    Diverticulosis of colon (without mention of hemorrhage)    Duodenitis without mention of hemorrhage    Esophageal reflux    Hypertrophy of prostate without urinary obstruction and other lower urinary tract symptoms (LUTS)    Left sided ulcerative (chronic) colitis (HCC)    Mononeuritis of unspecified site    Neuropathy    Nonspecific abnormal results of thyroid function study    Prostate cancer (HCC)    cryotherapy 2011 ([facility])   Schatzki's ring    Sleep apnea    does not use the CPAP   Stricture and stenosis of esophagus    Stroke (HCC)    left parietal, left parietal/occipital, right cerebellar, right pons   TIA (transient  ischemic attack) 2017   pt states was told mini stroke   Unspecified essential hypertension      Past Surgical History:  Procedure Laterality Date   ABDOMINAL AORTIC ANEURYSM REPAIR     2018   BALLOON DILATION  03/22/2011   Procedure: BALLOON DILATION;  Surgeon: Louis Meckel, MD;  Location: [facility];  Service: Endoscopy;  Laterality: N/A;  kelly/ebp   CHOLECYSTECTOMY     COLONOSCOPY     ENDOVASCULAR REPAIR/STENT GRAFT N/A 03/29/2016   Procedure: Endovascular Repair/Stent Graft;  Surgeon: Annice Needy, MD;  Location: [facility];  Service: Cardiovascular;  Laterality: N/A;   ESOPHAGOGASTRODUODENOSCOPY  03/22/2011   Procedure: ESOPHAGOGASTRODUODENOSCOPY (EGD);  Surgeon: Louis Meckel, MD;  Location: [facility];  Service: Endoscopy;  Laterality: N/A;   PROSTATE SURGERY     UPPER GASTROINTESTINAL ENDOSCOPY      Allergies and medications reviewed and updated.   Current Outpatient Medications:    aspirin EC 81 MG tablet, Take 81 mg by mouth daily. Swallow whole., Disp: , Rfl:    hydrochlorothiazide (MICROZIDE) 12.5 MG capsule, TAKE 1 CAPSULE BY MOUTH EVERY DAY, Disp: 90 capsule, Rfl: 3   mirabegron ER (MYRBETRIQ) 25 MG TB24 tablet, Take 1 tablet (25 mg total) by mouth daily., Disp: 90 tablet, Rfl: 3   rosuvastatin (CRESTOR) 40 MG tablet, Take 1 tablet (40 mg total)  by mouth daily., Disp: 90 tablet, Rfl: 3   sildenafil (VIAGRA) 25 MG tablet, Take 1 tablet (25 mg total) by mouth daily as needed for erectile dysfunction., Disp: 60 tablet, Rfl: 1   gabapentin (NEURONTIN) 300 MG capsule, Take 1 capsule (300 mg total) by mouth in the morning AND 2 capsules (600 mg total) every evening., Disp: 120 capsule, Rfl: 0  Allergies  Allergen Reactions   Metronidazole Anaphylaxis   Amoxicillin-Pot Clavulanate Other (See Comments)    UNKNOWN   Ciprofloxacin Other (See Comments)    UNKNOWN   Clindamycin/Lincomycin Other (See Comments)    Nausea, stomach pain, sweats   Mesalamine      REACTION: Intolerance    Objective:   BP 138/86 (BP Location: Left Arm)   Pulse 77   Temp 98.7 F (37.1 C)   Ht 6\' 3"  (1.905 m)   Wt 181 lb (82.1 kg)   SpO2 99%   BMI 22.62 kg/m      01/03/2023   11:45 AM 11/09/2022   12:03 PM 11/06/2022   11:59 AM  Vitals with BMI  Height 6\' 3"  6\' 3"  6\' 3"   Weight 181 lbs 180 lbs 176 lbs  BMI 22.62 22.5 22  Systolic 138 132 440  Diastolic 86 86 80  Pulse 77 72 59     Physical Exam Vitals and nursing note reviewed.  Constitutional:      Appearance: Normal appearance. He is normal weight.  HENT:     Head: Normocephalic and atraumatic.  Skin:    General: Skin is warm and dry.     Capillary Refill: Capillary refill takes less than 2 seconds.  Neurological:     General: No focal deficit present.     Mental Status: He is alert and oriented to person, place, and time. Mental status is at baseline.  Psychiatric:        Mood and Affect: Mood normal.        Behavior: Behavior normal.        Thought Content: Thought content normal.        Judgment: Judgment normal.     Assessment & Plan:  Neuropathy Assessment & Plan: Increase Gabapentin to 300mg  in AM and 600mg  in PM. Return to office if symptoms persist or worsen.   Erectile dysfunction, unspecified erectile dysfunction type Assessment & Plan: Start Sildenafil 25 mg daily PRN. Return to office if symptoms persist or worsen.    Other orders -     Gabapentin; Take 1 capsule (300 mg total) by mouth in the morning AND 2 capsules (600 mg total) every evening.  Dispense: 120 capsule; Refill: 0 -     Sildenafil Citrate; Take 1 tablet (25 mg total) by mouth daily as needed for erectile dysfunction.  Dispense: 60 tablet; Refill: 1     Follow up plan: Return if symptoms worsen or fail to improve.  Park Meo, FNP

## 2023-01-03 NOTE — Assessment & Plan Note (Signed)
Increase Gabapentin to 300mg  in AM and 600mg  in PM. Return to office if symptoms persist or worsen.

## 2023-01-03 NOTE — Telephone Encounter (Signed)
At the end of today's visit, patient was given an appointment reminder to schedule his Medicare AWV. He declined and stated he doesn't see the benefit of it. Advised patient he can call back any time to schedule if he changes his mind.

## 2023-01-03 NOTE — Assessment & Plan Note (Signed)
Start Sildenafil 25 mg daily PRN. Return to office if symptoms persist or worsen.

## 2023-01-05 ENCOUNTER — Encounter: Payer: PPO | Admitting: Family Medicine

## 2023-01-08 ENCOUNTER — Ambulatory Visit: Payer: Self-pay | Admitting: Family Medicine

## 2023-01-08 NOTE — Telephone Encounter (Signed)
Copied from CRM [id number]. Topic: Appointments - Appointment Scheduling >> Jan 08, 2023  9:02 AM Matthew Luna wrote: Patient is having bad trouble with this feet.  Chief Complaint: "neuropathy pain in feet" Symptoms: numbness and tingling in feet Frequency: constant Pertinent Negatives: Patient denies cp, sob, fever Disposition: [] ED /[x] Urgent Care (no appt availability in office) / [] Appointment(In office/virtual)/ []  [name]/ [] Home Care/ [] Refused Recommended Disposition /[]  Mobile Bus/ []  Follow-up with PCP Additional Notes: Called in with c/o bilateral feet pain due to "neuropathy"  States he cannot get ahold of the Dr. Andrey Cota the gabapentin that was prescribed is not working.  Patient would like to bee seen today or tomorrow, very unhappy with apt. Available.  Encouraged patient to go to [facility] for immediate care but declined.  He is waiting for the office to call him back.   Reason for Disposition  [1] Numbness (i.e., loss of sensation) of the face, arm / hand, or leg / foot on one side of the body AND [2] gradual onset (e.g., days to weeks) AND [3] present now  Answer Assessment - Initial Assessment Questions 1. SYMPTOM: "What is the main symptom you are concerned about?" (e.g., weakness, numbness)     Neuropathy in feet 2. ONSET: "When did this start?" (minutes, hours, days; while sleeping)     Going on over a year 4. PATTERN "Does this come and go, or has it been constant since it started?"  "Is it present now?"     constant 5. CARDIAC SYMPTOMS: "Have you had any of the following symptoms: chest pain, difficulty breathing, palpitations?"     *No Answer* 6. NEUROLOGIC SYMPTOMS: "Have you had any of the following symptoms: headache, dizziness, vision loss, double vision, changes in speech, unsteady on your feet?"     *No Answer* 7. OTHER SYMPTOMS: "Do you have any other symptoms?"   no  Protocols used: Neurologic Deficit-A-AH

## 2023-01-11 ENCOUNTER — Encounter: Payer: Self-pay | Admitting: Family Medicine

## 2023-01-11 ENCOUNTER — Ambulatory Visit: Payer: PPO | Admitting: Family Medicine

## 2023-01-11 VITALS — BP 136/84 | HR 76 | Temp 98.5°F | Ht 75.0 in | Wt 181.0 lb

## 2023-01-11 DIAGNOSIS — G629 Polyneuropathy, unspecified: Secondary | ICD-10-CM

## 2023-01-11 MED ORDER — SILDENAFIL CITRATE 100 MG PO TABS: 50.0000 mg | ORAL_TABLET | Freq: Every day | ORAL | 11 refills | Status: DC | PRN

## 2023-01-11 NOTE — Progress Notes (Signed)
Subjective:    Patient ID: Matthew Luna, male    DOB: Jun 25, 1940, 82 y.o.   MRN: [id number] Patient complains of bilateral foot pain.  The pain occurs primarily at night.  He reports a burning stinging pain in the balls of both feet that covers the entire plantar surface of both feet.  He reports his feet feel hot and cold.  This keeps him awake.  During the day the pain is not bad.  He is currently on gabapentin 300 mg in the morning and 600 mg in the evening however he is currently taking gabapentin at 5:00 in the afternoon.  He typically goes to bed 10 to 12:00 at night.  He has palpable dorsalis pedis and posterior tibialis pulses bilaterally.  There is no rash.  There is no skin breakdown on either foot.  He has normal reflexes.  He has diminished sensation to 10 g monofilament in all 10 toes Past Medical History:  Diagnosis Date   AAA (abdominal aortic aneurysm) (HCC)    Actinic keratosis    Allergy    mild    Anxiety    CKD (chronic kidney disease) stage 3, GFR 30-59 ml/min (HCC)    Diverticulosis of colon (without mention of hemorrhage)    Duodenitis without mention of hemorrhage    Esophageal reflux    Hypertrophy of prostate without urinary obstruction and other lower urinary tract symptoms (LUTS)    Left sided ulcerative (chronic) colitis (HCC)    Mononeuritis of unspecified site    Neuropathy    Nonspecific abnormal results of thyroid function study    Prostate cancer (HCC)    cryotherapy 2011 ([facility])   Schatzki's ring    Sleep apnea    does not use the CPAP   Stricture and stenosis of esophagus    Stroke (HCC)    left parietal, left parietal/occipital, right cerebellar, right pons   TIA (transient ischemic attack) 2017   pt states was told mini stroke   Unspecified essential hypertension    Past Surgical History:  Procedure Laterality Date   ABDOMINAL AORTIC ANEURYSM REPAIR     2018   BALLOON DILATION  03/22/2011   Procedure: BALLOON DILATION;  Surgeon: Louis Meckel, MD;  Location: [facility];  Service: Endoscopy;  Laterality: N/A;  kelly/ebp   CHOLECYSTECTOMY     COLONOSCOPY     ENDOVASCULAR REPAIR/STENT GRAFT N/A 03/29/2016   Procedure: Endovascular Repair/Stent Graft;  Surgeon: Annice Needy, MD;  Location: [facility];  Service: Cardiovascular;  Laterality: N/A;   ESOPHAGOGASTRODUODENOSCOPY  03/22/2011   Procedure: ESOPHAGOGASTRODUODENOSCOPY (EGD);  Surgeon: Louis Meckel, MD;  Location: [facility];  Service: Endoscopy;  Laterality: N/A;   PROSTATE SURGERY     UPPER GASTROINTESTINAL ENDOSCOPY     Current Outpatient Medications on File Prior to Visit  Medication Sig Dispense Refill   gabapentin (NEURONTIN) 300 MG capsule Take 1 capsule (300 mg total) by mouth in the morning AND 2 capsules (600 mg total) every evening. 120 capsule 0   hydrochlorothiazide (MICROZIDE) 12.5 MG capsule TAKE 1 CAPSULE BY MOUTH EVERY DAY 90 capsule 3   mirabegron ER (MYRBETRIQ) 25 MG TB24 tablet Take 1 tablet (25 mg total) by mouth daily. 90 tablet 3   rosuvastatin (CRESTOR) 40 MG tablet Take 1 tablet (40 mg total) by mouth daily. 90 tablet 3   sildenafil (VIAGRA) 25 MG tablet Take 1 tablet (25 mg total) by mouth daily as needed for erectile dysfunction. (Patient  taking differently: Take 25 mg by mouth daily as needed for erectile dysfunction. Pt reports that viagra is not working.) 60 tablet 1   aspirin EC 81 MG tablet Take 81 mg by mouth daily. Swallow whole. (Patient not taking: Reported on 01/11/2023)     No current facility-administered medications on file prior to visit.   Allergies  Allergen Reactions   Metronidazole Anaphylaxis   Amoxicillin-Pot Clavulanate Other (See Comments)    UNKNOWN   Ciprofloxacin Other (See Comments)    UNKNOWN   Clindamycin/Lincomycin Other (See Comments)    Nausea, stomach pain, sweats   Mesalamine     REACTION: Intolerance   Social History   Socioeconomic History   Marital status: Married    Spouse name: Not  on file   Number of children: 1   Years of education: Some colg   Highest education level: Not on file  Occupational History   Occupation: Retired   Tobacco Use   Smoking status: Never   Smokeless tobacco: Never  Vaping Use   Vaping status: Never Used  Substance and Sexual Activity   Alcohol use: Yes    Alcohol/week: 14.0 standard drinks of alcohol    Types: 14 Glasses of wine per week    Comment: per pt -drinks 10 oz red wine every night!   Drug use: No   Sexual activity: Never  Other Topics Concern   Not on file  Social History Narrative   1 -2 cups of coffee a day, some tea consumption    Left handed   Lives with wife. Married x 59 years in 2022.   Truck driver.   Social Drivers of Corporate investment banker Strain: Low Risk  (02/03/2021)   Overall Financial Resource Strain (CARDIA)    Difficulty of Paying Living Expenses: Not hard at all  Food Insecurity: No Food Insecurity (02/03/2021)   Hunger Vital Sign    Worried About Running Out of Food in the Last Year: Never true    Ran Out of Food in the Last Year: Never true  Transportation Needs: No Transportation Needs (02/03/2021)   PRAPARE - Administrator, Civil Service (Medical): No    Lack of Transportation (Non-Medical): No  Physical Activity: Inactive (02/03/2021)   Exercise Vital Sign    Days of Exercise per Week: 0 days    Minutes of Exercise per Session: 0 min  Stress: No Stress Concern Present (02/03/2021)   [employer] of Occupational Health - Occupational Stress Questionnaire    Feeling of Stress : Not at all  Social Connections: Moderately Isolated (02/03/2021)   Social Connection and Isolation Panel [NHANES]    Frequency of Communication with Friends and Family: More than three times a week    Frequency of Social Gatherings with Friends and Family: More than three times a week    Attends Religious Services: Never    Database administrator or Organizations: No    Attends Banker  Meetings: Never    Marital Status: Married  Catering manager Violence: Not At Risk (02/03/2021)   Humiliation, Afraid, Rape, and Kick questionnaire    Fear of Current or Ex-Partner: No    Emotionally Abused: No    Physically Abused: No    Sexually Abused: No      Review of Systems  All other systems reviewed and are negative.      Objective:   Physical Exam Vitals reviewed.  Constitutional:      General: He  is not in acute distress.    Appearance: He is well-developed. He is not diaphoretic.  Neck:     Thyroid: No thyromegaly.     Vascular: No JVD.  Cardiovascular:     Rate and Rhythm: Normal rate and regular rhythm.     Heart sounds: Normal heart sounds. No murmur heard.    No friction rub. No gallop.  Pulmonary:     Effort: Pulmonary effort is normal. No respiratory distress.     Breath sounds: Normal breath sounds. No wheezing or rales.  Lymphadenopathy:     Cervical: No cervical adenopathy.  Neurological:     Mental Status: He is alert and oriented to person, place, and time.     Motor: Motor function is intact. No tremor, atrophy, abnormal muscle tone or pronator drift.     Coordination: Coordination is intact.           Assessment & Plan:  Neuropathy My biggest concern is polypharmacy in this patient who has some mild age-related memory decline and also deals with frequent dizziness due to atherosclerosis in the arteries to the brain.  I recommended that he try taking gabapentin 600 mg closer to bedtime, 1 hour before bed.  If this does not help, we could either increase the dose of gabapentin which I am concerned would make dizziness worse or supplement with hydrocodone.

## 2023-01-16 ENCOUNTER — Other Ambulatory Visit: Payer: Self-pay | Admitting: Family Medicine

## 2023-01-16 ENCOUNTER — Telehealth: Payer: Self-pay

## 2023-01-16 NOTE — Telephone Encounter (Signed)
Copied from CRM [id number]. Topic: Clinical - Medical Advice >> Jan 16, 2023 10:57 AM Theodis Sato wrote: Reason for CRM: PT wants to inform Dr. Tanya Nones the medication he prescribed him last week on 12/12 has not helped him

## 2023-01-17 ENCOUNTER — Ambulatory Visit: Payer: PPO | Admitting: Dermatology

## 2023-01-18 ENCOUNTER — Ambulatory Visit: Payer: PPO | Admitting: Family Medicine

## 2023-01-18 ENCOUNTER — Encounter: Payer: Self-pay | Admitting: Family Medicine

## 2023-01-18 VITALS — BP 126/86 | Temp 98.6°F | Ht 75.0 in | Wt 183.0 lb

## 2023-01-18 DIAGNOSIS — N529 Male erectile dysfunction, unspecified: Secondary | ICD-10-CM

## 2023-01-18 DIAGNOSIS — G629 Polyneuropathy, unspecified: Secondary | ICD-10-CM | POA: Diagnosis not present

## 2023-01-18 MED ORDER — AMITRIPTYLINE HCL 25 MG PO TABS: 25.0000 mg | ORAL_TABLET | Freq: Every evening | ORAL | 1 refills | Status: DC | PRN

## 2023-01-18 NOTE — Progress Notes (Signed)
Subjective:    Patient ID: Matthew Luna, male    DOB: Aug 05, 1940, 82 y.o.   MRN: [id number] 01/11/23 Patient complains of bilateral foot pain.  The pain occurs primarily at night.  He reports a burning stinging pain in the balls of both feet that covers the entire plantar surface of both feet.  He reports his feet feel hot and cold.  This keeps him awake.  During the day the pain is not bad.  He is currently on gabapentin 300 mg in the morning and 600 mg in the evening however he is currently taking gabapentin at 5:00 in the afternoon.  He typically goes to bed 10 to 12:00 at night.  He has palpable dorsalis pedis and posterior tibialis pulses bilaterally.  There is no rash.  There is no skin breakdown on either foot.  He has normal reflexes.  He has diminished sensation to 10 g monofilament in all 10 toes.  At that time, my plan was: My biggest concern is polypharmacy in this patient who has some mild age-related memory decline and also deals with frequent dizziness due to atherosclerosis in the arteries to the brain.  I recommended that he try taking gabapentin 600 mg closer to bedtime, 1 hour before bed.  If this does not help, we could either increase the dose of gabapentin which I am concerned would make dizziness worse or supplement with hydrocodone.  01/18/23 Patient states that he has not seen significant improvement since taking the gabapentin later in the evening.  He continues to have nerve pain in both feet primarily at night.  This prevents him from sleeping.  He also tried 100 mg of Viagra.  He saw no benefit.  He was unable to achieve erection.  We discussed a vacuum pump and the patient would like to try that as a last resort.  However he is interested in other options for ED, even injections Past Medical History:  Diagnosis Date   AAA (abdominal aortic aneurysm) (HCC)    Actinic keratosis    Allergy    mild    Anxiety    CKD (chronic kidney disease) stage 3, GFR 30-59 ml/min  (HCC)    Diverticulosis of colon (without mention of hemorrhage)    Duodenitis without mention of hemorrhage    Esophageal reflux    Hypertrophy of prostate without urinary obstruction and other lower urinary tract symptoms (LUTS)    Left sided ulcerative (chronic) colitis (HCC)    Mononeuritis of unspecified site    Neuropathy    Nonspecific abnormal results of thyroid function study    Prostate cancer (HCC)    cryotherapy 2011 ([facility])   Schatzki's ring    Sleep apnea    does not use the CPAP   Stricture and stenosis of esophagus    Stroke (HCC)    left parietal, left parietal/occipital, right cerebellar, right pons   TIA (transient ischemic attack) 2017   pt states was told mini stroke   Unspecified essential hypertension    Past Surgical History:  Procedure Laterality Date   ABDOMINAL AORTIC ANEURYSM REPAIR     2018   BALLOON DILATION  03/22/2011   Procedure: BALLOON DILATION;  Surgeon: Louis Meckel, MD;  Location: [facility];  Service: Endoscopy;  Laterality: N/A;  kelly/ebp   CHOLECYSTECTOMY     COLONOSCOPY     ENDOVASCULAR REPAIR/STENT GRAFT N/A 03/29/2016   Procedure: Endovascular Repair/Stent Graft;  Surgeon: Annice Needy, MD;  Location: [facility];  Service: Cardiovascular;  Laterality: N/A;   ESOPHAGOGASTRODUODENOSCOPY  03/22/2011   Procedure: ESOPHAGOGASTRODUODENOSCOPY (EGD);  Surgeon: Louis Meckel, MD;  Location: [facility];  Service: Endoscopy;  Laterality: N/A;   PROSTATE SURGERY     UPPER GASTROINTESTINAL ENDOSCOPY     Current Outpatient Medications on File Prior to Visit  Medication Sig Dispense Refill   gabapentin (NEURONTIN) 300 MG capsule Take 1 capsule (300 mg total) by mouth in the morning AND 2 capsules (600 mg total) every evening. 120 capsule 0   hydrochlorothiazide (MICROZIDE) 12.5 MG capsule TAKE 1 CAPSULE BY MOUTH EVERY DAY 90 capsule 3   mirabegron ER (MYRBETRIQ) 25 MG TB24 tablet Take 1 tablet (25 mg total) by mouth daily. 90 tablet 3    rosuvastatin (CRESTOR) 40 MG tablet Take 1 tablet (40 mg total) by mouth daily. 90 tablet 3   sildenafil (VIAGRA) 100 MG tablet Take 0.5-1 tablets (50-100 mg total) by mouth daily as needed for erectile dysfunction. 5 tablet 11   aspirin EC 81 MG tablet Take 81 mg by mouth daily. Swallow whole. (Patient not taking: Reported on 01/11/2023)     sildenafil (VIAGRA) 25 MG tablet Take 1 tablet (25 mg total) by mouth daily as needed for erectile dysfunction. (Patient not taking: Reported on 01/18/2023) 60 tablet 1   No current facility-administered medications on file prior to visit.   Allergies  Allergen Reactions   Metronidazole Anaphylaxis   Amoxicillin-Pot Clavulanate Other (See Comments)    UNKNOWN   Ciprofloxacin Other (See Comments)    UNKNOWN   Clindamycin/Lincomycin Other (See Comments)    Nausea, stomach pain, sweats   Mesalamine     REACTION: Intolerance   Social History   Socioeconomic History   Marital status: Married    Spouse name: Not on file   Number of children: 1   Years of education: Some colg   Highest education level: Not on file  Occupational History   Occupation: Retired   Tobacco Use   Smoking status: Never   Smokeless tobacco: Never  Vaping Use   Vaping status: Never Used  Substance and Sexual Activity   Alcohol use: Yes    Alcohol/week: 14.0 standard drinks of alcohol    Types: 14 Glasses of wine per week    Comment: per pt -drinks 10 oz red wine every night!   Drug use: No   Sexual activity: Never  Other Topics Concern   Not on file  Social History Narrative   1 -2 cups of coffee a day, some tea consumption    Left handed   Lives with wife. Married x 59 years in 2022.   Truck driver.   Social Drivers of Corporate investment banker Strain: Low Risk  (02/03/2021)   Overall Financial Resource Strain (CARDIA)    Difficulty of Paying Living Expenses: Not hard at all  Food Insecurity: No Food Insecurity (02/03/2021)   Hunger Vital Sign    Worried  About Running Out of Food in the Last Year: Never true    Ran Out of Food in the Last Year: Never true  Transportation Needs: No Transportation Needs (02/03/2021)   PRAPARE - Administrator, Civil Service (Medical): No    Lack of Transportation (Non-Medical): No  Physical Activity: Inactive (02/03/2021)   Exercise Vital Sign    Days of Exercise per Week: 0 days    Minutes of Exercise per Session: 0 min  Stress: No Stress Concern Present (02/03/2021)  [employer] of Occupational Health - Occupational Stress Questionnaire    Feeling of Stress : Not at all  Social Connections: Moderately Isolated (02/03/2021)   Social Connection and Isolation Panel [NHANES]    Frequency of Communication with Friends and Family: More than three times a week    Frequency of Social Gatherings with Friends and Family: More than three times a week    Attends Religious Services: Never    Database administrator or Organizations: No    Attends Banker Meetings: Never    Marital Status: Married  Catering manager Violence: Not At Risk (02/03/2021)   Humiliation, Afraid, Rape, and Kick questionnaire    Fear of Current or Ex-Partner: No    Emotionally Abused: No    Physically Abused: No    Sexually Abused: No      Review of Systems  All other systems reviewed and are negative.      Objective:   Physical Exam Vitals reviewed.  Constitutional:      General: He is not in acute distress.    Appearance: He is well-developed. He is not diaphoretic.  Neck:     Thyroid: No thyromegaly.     Vascular: No JVD.  Cardiovascular:     Rate and Rhythm: Normal rate and regular rhythm.     Heart sounds: Normal heart sounds. No murmur heard.    No friction rub. No gallop.  Pulmonary:     Effort: Pulmonary effort is normal. No respiratory distress.     Breath sounds: Normal breath sounds. No wheezing or rales.  Lymphadenopathy:     Cervical: No cervical adenopathy.  Neurological:     Mental  Status: He is alert and oriented to person, place, and time.     Motor: Motor function is intact. No tremor, atrophy, abnormal muscle tone or pronator drift.     Coordination: Coordination is intact.           Assessment & Plan:  Erectile dysfunction, unspecified erectile dysfunction type - Plan: Ambulatory referral to Urology  Neuropathy First we will add Elavil 25 mg p.o. nightly and continue gabapentin 300 mg in the morning 600 mg in the evening.  I am hoping that the patient will have improvement in neuropathic pain with the addition of Elavil and that it may help him sleep at night.  I am afraid to increase the dose of gabapentin as this would likely cause worsening dizziness in a patient who has dizziness already.  Second I will send the patient to urology to discuss prostaglandin E 1 injections intracavernosal.  We did not have experience with that in his clinic but he has tried and failed maximum dose sildenafil

## 2023-01-29 ENCOUNTER — Encounter: Payer: Self-pay | Admitting: Family Medicine

## 2023-01-29 ENCOUNTER — Ambulatory Visit: Payer: PPO | Admitting: Family Medicine

## 2023-01-29 ENCOUNTER — Ambulatory Visit (INDEPENDENT_AMBULATORY_CARE_PROVIDER_SITE_OTHER): Payer: PPO | Admitting: Family Medicine

## 2023-01-29 VITALS — BP 140/100 | HR 75 | Temp 97.5°F | Ht 75.0 in | Wt 178.0 lb

## 2023-01-29 DIAGNOSIS — N529 Male erectile dysfunction, unspecified: Secondary | ICD-10-CM

## 2023-01-29 NOTE — Assessment & Plan Note (Signed)
Referral placed to [facility] per patient request. May continue Sildenafil PRN.

## 2023-01-29 NOTE — Progress Notes (Signed)
Subjective:  HPI: Matthew Luna is a 82 y.o. male presenting on 01/29/2023 for Acute Visit (PT states the medication Dr.Pickard gave him last week has not worked)   HPI Patient is in today for follow-up for a local referral to urology. He has been seen several times for erectile dysfunction over the past month. Has tried Sildenafil without relief. Discussed intravacernosal injections with my partner and a referral was placed to his previous Urologist who is located in [place]. Mr Utecht is requesting a local referral today. No new or worsening symptoms.  Review of Systems  All other systems reviewed and are negative.   Relevant past medical history reviewed and updated as indicated.   Past Medical History:  Diagnosis Date   AAA (abdominal aortic aneurysm) (HCC)    Actinic keratosis    Allergy    mild    Anxiety    CKD (chronic kidney disease) stage 3, GFR 30-59 ml/min (HCC)    Diverticulosis of colon (without mention of hemorrhage)    Duodenitis without mention of hemorrhage    Esophageal reflux    Hypertrophy of prostate without urinary obstruction and other lower urinary tract symptoms (LUTS)    Left sided ulcerative (chronic) colitis (HCC)    Mononeuritis of unspecified site    Neuropathy    Nonspecific abnormal results of thyroid function study    Prostate cancer (HCC)    cryotherapy 2011 ([facility])   Schatzki's ring    Sleep apnea    does not use the CPAP   Stricture and stenosis of esophagus    Stroke (HCC)    left parietal, left parietal/occipital, right cerebellar, right pons   TIA (transient ischemic attack) 2017   pt states was told mini stroke   Unspecified essential hypertension      Past Surgical History:  Procedure Laterality Date   ABDOMINAL AORTIC ANEURYSM REPAIR     2018   BALLOON DILATION  03/22/2011   Procedure: BALLOON DILATION;  Surgeon: Louis Meckel, MD;  Location: [facility];  Service: Endoscopy;  Laterality: N/A;  kelly/ebp    CHOLECYSTECTOMY     COLONOSCOPY     ENDOVASCULAR REPAIR/STENT GRAFT N/A 03/29/2016   Procedure: Endovascular Repair/Stent Graft;  Surgeon: Annice Needy, MD;  Location: [facility];  Service: Cardiovascular;  Laterality: N/A;   ESOPHAGOGASTRODUODENOSCOPY  03/22/2011   Procedure: ESOPHAGOGASTRODUODENOSCOPY (EGD);  Surgeon: Louis Meckel, MD;  Location: [facility];  Service: Endoscopy;  Laterality: N/A;   PROSTATE SURGERY     UPPER GASTROINTESTINAL ENDOSCOPY      Allergies and medications reviewed and updated.   Current Outpatient Medications:    amitriptyline (ELAVIL) 25 MG tablet, Take 1 tablet (25 mg total) by mouth at bedtime as needed for sleep (for nerve pain)., Disp: 30 tablet, Rfl: 1   aspirin EC 81 MG tablet, Take 81 mg by mouth daily. Swallow whole., Disp: , Rfl:    gabapentin (NEURONTIN) 300 MG capsule, Take 1 capsule (300 mg total) by mouth in the morning AND 2 capsules (600 mg total) every evening., Disp: 120 capsule, Rfl: 0   hydrochlorothiazide (MICROZIDE) 12.5 MG capsule, TAKE 1 CAPSULE BY MOUTH EVERY DAY, Disp: 90 capsule, Rfl: 3   mirabegron ER (MYRBETRIQ) 25 MG TB24 tablet, Take 1 tablet (25 mg total) by mouth daily., Disp: 90 tablet, Rfl: 3   rosuvastatin (CRESTOR) 40 MG tablet, Take 1 tablet (40 mg total) by mouth daily., Disp: 90 tablet, Rfl: 3   sildenafil (VIAGRA)  100 MG tablet, Take 0.5-1 tablets (50-100 mg total) by mouth daily as needed for erectile dysfunction., Disp: 5 tablet, Rfl: 11   sildenafil (VIAGRA) 25 MG tablet, Take 1 tablet (25 mg total) by mouth daily as needed for erectile dysfunction., Disp: 60 tablet, Rfl: 1  Allergies  Allergen Reactions   Metronidazole Anaphylaxis   Amoxicillin-Pot Clavulanate Other (See Comments)    UNKNOWN   Ciprofloxacin Other (See Comments)    UNKNOWN   Clindamycin/Lincomycin Other (See Comments)    Nausea, stomach pain, sweats   Mesalamine     REACTION: Intolerance    Objective:   BP (!) 140/100 Comment:  per pt forgot to take bp meds/per pt per pcp to keep bp sl..high for better blood flow  Pulse 75   Temp (!) 97.5 F (36.4 C) (Oral)   Ht 6\' 3"  (1.905 m)   Wt 178 lb (80.7 kg)   SpO2 97%   BMI 22.25 kg/m      01/29/2023    9:04 AM 01/18/2023    9:12 AM 01/11/2023    9:10 AM  Vitals with BMI  Height 6\' 3"  6\' 3"  6\' 3"   Weight 178 lbs 183 lbs 181 lbs  BMI 22.25 22.87 22.62  Systolic 140 126 528  Diastolic 100 86 84  Pulse 75  76     Physical Exam Vitals and nursing note reviewed.  Constitutional:      Appearance: Normal appearance. He is normal weight.  HENT:     Head: Normocephalic and atraumatic.  Skin:    General: Skin is warm and dry.     Capillary Refill: Capillary refill takes less than 2 seconds.  Neurological:     General: No focal deficit present.     Mental Status: He is alert and oriented to person, place, and time. Mental status is at baseline.  Psychiatric:        Mood and Affect: Mood normal.        Behavior: Behavior normal.        Thought Content: Thought content normal.        Judgment: Judgment normal.     Assessment & Plan:  Erectile dysfunction, unspecified erectile dysfunction type Assessment & Plan: Referral placed to [facility] per patient request. May continue Sildenafil PRN.       Follow up plan: Return if symptoms worsen or fail to improve.  Park Meo, FNP

## 2023-02-11 ENCOUNTER — Other Ambulatory Visit: Payer: Self-pay | Admitting: Family Medicine

## 2023-02-13 NOTE — Telephone Encounter (Signed)
 Requested medication (s) are due for refill today: no  Requested medication (s) are on the active medication list: yes  Last refill:  01/18/23 #30 1 RF  Future visit scheduled: no  Notes to clinic:  pt was given limited amount of med. Pharmacy requesting 90 day supply   Requested Prescriptions  Pending Prescriptions Disp Refills   amitriptyline  (ELAVIL ) 25 MG tablet [Pharmacy Med Name: AMITRIPTYLINE  HCL 25 MG TAB] 90 tablet 1    Sig: Take 1 tablet (25 mg total) by mouth at bedtime as needed for sleep (for nerve pain).     Psychiatry:  Antidepressants - Heterocyclics (TCAs) Failed - 02/13/2023 11:58 AM      Failed - Valid encounter within last 6 months    Recent Outpatient Visits           2 years ago Benign essential HTN   [facility] Duanne Butler DASEN, MD   2 years ago Cerebrovascular accident (CVA) due to stenosis of small artery (HCC)   [facility] Duanne Butler DASEN, MD   2 years ago Cerebrovascular accident (CVA) due to stenosis of small artery (HCC)   [facility] Duanne Butler DASEN, MD   2 years ago Intracranial atherosclerosis   [facility] Duanne Butler DASEN, MD   2 years ago Actinic keratosis   [facility] Pickard, Butler DASEN, MD       Future Appointments             In 3 weeks Francisca, Redell BROCKS, MD [facility]

## 2023-03-08 ENCOUNTER — Ambulatory Visit: Payer: PPO | Admitting: Urology

## 2023-03-08 VITALS — BP 174/123 | HR 93 | Ht 75.0 in | Wt 177.3 lb

## 2023-03-08 DIAGNOSIS — Z8546 Personal history of malignant neoplasm of prostate: Secondary | ICD-10-CM | POA: Diagnosis not present

## 2023-03-08 DIAGNOSIS — C61 Malignant neoplasm of prostate: Secondary | ICD-10-CM

## 2023-03-08 DIAGNOSIS — R399 Unspecified symptoms and signs involving the genitourinary system: Secondary | ICD-10-CM

## 2023-03-08 DIAGNOSIS — N528 Other male erectile dysfunction: Secondary | ICD-10-CM

## 2023-03-08 MED ORDER — TADALAFIL 20 MG PO TABS: 20.0000 mg | ORAL_TABLET | Freq: Every day | ORAL | 11 refills | Status: DC | PRN

## 2023-03-08 MED ORDER — TAMSULOSIN HCL 0.4 MG PO CAPS
0.4000 mg | ORAL_CAPSULE | Freq: Every day | ORAL | 11 refills | Status: DC
Start: 2023-03-08 — End: 2023-07-28

## 2023-03-08 NOTE — Progress Notes (Signed)
 03/08/23 1:45 PM   Matthew Luna [birth year] [id number]  CC: History of prostate cancer, ED, LUTS/nocturia, urinary symptoms, nephrolithiasis  HPI: Comorbid 83 year old male with a number of other medical issues referred to establish care for the above issues.  He was previously followed by Dr. Ike as well as [facility] urology.  He has a history of prostate cancer that was reportedly treated with cryotherapy in 2011 at [facility], and PSA has remained very low since that time.  He has been on Myrbetriq  for urinary symptoms of frequency in the past, he does not feel this is particularly helpful.  He thinks he was on Flomax  at some point in the past as well and that may have been more helpful.  He also has nocturia 4-5 times at night.  He has sleep apnea but is noncompliant with CPAP machine.  Reportedly has a history of nephrolithiasis with nonobstructive stones bilaterally that has been monitored with renal ultrasound at [facility].  He denies any gross hematuria or stone symptoms today.   PMH: Past Medical History:  Diagnosis Date   AAA (abdominal aortic aneurysm) (HCC)    Actinic keratosis    Allergy    mild    Anxiety    CKD (chronic kidney disease) stage 3, GFR 30-59 ml/min (HCC)    Diverticulosis of colon (without mention of hemorrhage)    Duodenitis without mention of hemorrhage    Esophageal reflux    Hypertrophy of prostate without urinary obstruction and other lower urinary tract symptoms (LUTS)    Left sided ulcerative (chronic) colitis (HCC)    Mononeuritis of unspecified site    Neuropathy    Nonspecific abnormal results of thyroid  function study    Prostate cancer (HCC)    cryotherapy 2011 ([facility])   Schatzki's ring    Sleep apnea    does not use the CPAP   Stricture and stenosis of esophagus    Stroke (HCC)    left parietal, left parietal/occipital, right cerebellar, right pons   TIA (transient ischemic attack) 2017   pt states was told mini stroke   Unspecified essential  hypertension     Surgical History: Past Surgical History:  Procedure Laterality Date   ABDOMINAL AORTIC ANEURYSM REPAIR     2018   BALLOON DILATION  03/22/2011   Procedure: BALLOON DILATION;  Surgeon: Lamar JONETTA Aho, MD;  Location: [facility];  Service: Endoscopy;  Laterality: N/A;  kelly/ebp   CHOLECYSTECTOMY     COLONOSCOPY     ENDOVASCULAR REPAIR/STENT GRAFT N/A 03/29/2016   Procedure: Endovascular Repair/Stent Graft;  Surgeon: Selinda GORMAN Gu, MD;  Location: [facility] INVASIVE CV LAB;  Service: Cardiovascular;  Laterality: N/A;   ESOPHAGOGASTRODUODENOSCOPY  03/22/2011   Procedure: ESOPHAGOGASTRODUODENOSCOPY (EGD);  Surgeon: Lamar JONETTA Aho, MD;  Location: [facility];  Service: Endoscopy;  Laterality: N/A;   PROSTATE SURGERY     UPPER GASTROINTESTINAL ENDOSCOPY       Family History: Family History  Problem Relation Age of Onset   Cancer Mother        unknown type   Snoring Father    Anesthesia problems Neg Hx    Hypotension Neg Hx    Malignant hyperthermia Neg Hx    Pseudochol deficiency Neg Hx    Colon cancer Neg Hx    Colon polyps Neg Hx    Esophageal cancer Neg Hx    Stomach cancer Neg Hx    Rectal cancer Neg Hx     Social History:  reports that he  has never smoked. He has never used smokeless tobacco. He reports current alcohol  use of about 14.0 standard drinks of alcohol  per week. He reports that he does not use drugs.  Physical Exam: BP (!) 174/123   Pulse 93   Ht 6' 3 (1.905 m)   Wt 177 lb 5 oz (80.4 kg)   BMI 22.16 kg/m    Constitutional:  Alert and oriented, No acute distress. Cardiovascular: No clubbing, cyanosis, or edema. Respiratory: Normal respiratory effort, no increased work of breathing. GI: Abdomen is soft, nontender, nondistended, no abdominal masses  Laboratory Data: Reviewed, see HPI, PSA undetectable   Assessment & Plan:   83 year old male with distant history of prostate cancer treated with cryoablation, undetectable PSA since that time,  urinary symptoms and nocturia likely multifactorial, and ED.  Outside records from [facility] were reviewed at length.  Reassurance provided regarding low PSA after history of prostate cancer treated with cryoablation, no evidence of recurrence  Urinary symptoms likely multifactorial, encouraged to avoid bladder irritants and his high volume intake of tea, will change Myrbetriq  to Flomax , not a good candidate for outlet procedures with his history of cryoablation, could consider cystoscopy/TRUS in the future if significantly worsening symptoms despite medical and behavioral management  In terms of ED, failed trial of max dose sildenafil , recommended trial of max dose Cialis , and if no improvement can proceed to penile injections.  Will also check testosterone  per the guideline recommendations at follow-up  Trial of Flomax  to replace Myrbetriq  for urinary symptoms Trial of Cialis  20 mg on demand for ED RTC 6 weeks symptom check, testosterone  prior, PVR, consider penile injections if no response to Cialis   I spent 60 total minutes on the day of the encounter including pre-visit review of the medical record, face-to-face time with the patient, and post visit ordering of labs/imaging/tests.   Redell Burnet, MD 03/08/2023  [facility] [address], [address] [place], [place] [address] [phone]

## 2023-03-08 NOTE — Patient Instructions (Signed)

## 2023-03-28 ENCOUNTER — Telehealth: Payer: Self-pay | Admitting: Urology

## 2023-03-28 NOTE — Telephone Encounter (Signed)
 Pt stopped by office to let us know Tadalafil is NOT working and he would like to try something different.  He uses [facility] in [place].  He said you can leave a message on his home phone number.

## 2023-03-28 NOTE — Telephone Encounter (Signed)
 Please call pt and offer to move appointment with Carollee Herter up sooner to discuss trimix injections per Dr. Richardo Hanks.

## 2023-03-28 NOTE — Telephone Encounter (Signed)
 Tried to contact patient.  No answer and no v/m set up.  I scheduled appt w/Shannon and mailed letter.  No MyChart set up.

## 2023-04-03 NOTE — Progress Notes (Unsigned)
 04/05/2023 11:02 AM   Matthew Luna Sep 13, 1940 [id number]  Referring provider: Donita Brooks, MD [address] [facility] [address] [place],  [place] [address]  Urological history: 1. Prostate cancer -PSA (07/2022) <0.1 -treated with cryotherapy (2011)  2. ED -contributing factors of age, prostate cancer, HTN, CKD, stroke, sleep apnea, neuropathy, BPH, hypothyroidism and alcohol consumption -failed PDE5i's   3. LU TS -contributing factors of age, prostate cancer, HTN, CKD, stroke, sleep apnea, neuropathy, BPH, hypothyroidism and alcohol consumption -Myrbetriq 25 mg daily  4. Nocturia -non compliant with CPAP  Chief Complaint  Patient presents with   Other    HPI: Matthew Luna is a 83 y.o. male who presents today for ED.    Previous records reviewed.   He states that the tadalafil 20 mg on demand dosing was not effective.  He stated, " you might as well throw those out the window."    It has been a almost a year since he has had a satisfactory erection.  He states he no longer experiences spontaneous erections.  To his recollection, he has never had pain with erections or curvature with erections.  He also states Viagra was not effective.   He states his wife is very motivated and wanting to reengage in sexual intercourse.  PMH: Past Medical History:  Diagnosis Date   AAA (abdominal aortic aneurysm) (HCC)    Actinic keratosis    Allergy    mild    Anxiety    CKD (chronic kidney disease) stage 3, GFR 30-59 ml/min (HCC)    Diverticulosis of colon (without mention of hemorrhage)    Duodenitis without mention of hemorrhage    Esophageal reflux    Hypertrophy of prostate without urinary obstruction and other lower urinary tract symptoms (LUTS)    Left sided ulcerative (chronic) colitis (HCC)    Mononeuritis of unspecified site    Neuropathy    Nonspecific abnormal results of thyroid function study    Prostate cancer (HCC)    cryotherapy 2011 ([facility])   Schatzki's ring     Sleep apnea    does not use the CPAP   Stricture and stenosis of esophagus    Stroke (HCC)    left parietal, left parietal/occipital, right cerebellar, right pons   TIA (transient ischemic attack) 2017   pt states was told mini stroke   Unspecified essential hypertension     Surgical History: Past Surgical History:  Procedure Laterality Date   ABDOMINAL AORTIC ANEURYSM REPAIR     2018   BALLOON DILATION  03/22/2011   Procedure: BALLOON DILATION;  Surgeon: Louis Meckel, MD;  Location: [facility];  Service: Endoscopy;  Laterality: N/A;  kelly/ebp   CHOLECYSTECTOMY     COLONOSCOPY     ENDOVASCULAR REPAIR/STENT GRAFT N/A 03/29/2016   Procedure: Endovascular Repair/Stent Graft;  Surgeon: Annice Needy, MD;  Location: [facility] INVASIVE CV LAB;  Service: Cardiovascular;  Laterality: N/A;   ESOPHAGOGASTRODUODENOSCOPY  03/22/2011   Procedure: ESOPHAGOGASTRODUODENOSCOPY (EGD);  Surgeon: Louis Meckel, MD;  Location: [facility];  Service: Endoscopy;  Laterality: N/A;   PROSTATE SURGERY     UPPER GASTROINTESTINAL ENDOSCOPY      Home Medications:  Allergies as of 04/05/2023       Reactions   Metronidazole Anaphylaxis   Amoxicillin-pot Clavulanate Other (See Comments)   UNKNOWN   Ciprofloxacin Other (See Comments)   UNKNOWN   Clindamycin/lincomycin Other (See Comments)   Nausea, stomach pain, sweats   Mesalamine  REACTION: Intolerance        Medication List        Accurate as of April 05, 2023 11:02 AM. If you have any questions, ask your nurse or doctor.          amitriptyline 25 MG tablet Commonly known as: ELAVIL Take 1 tablet (25 mg total) by mouth at bedtime as needed for sleep (for nerve pain).   gabapentin 300 MG capsule Commonly known as: NEURONTIN Take 1 capsule (300 mg total) by mouth in the morning AND 2 capsules (600 mg total) every evening.   hydrochlorothiazide 12.5 MG capsule Commonly known as: MICROZIDE TAKE 1 CAPSULE BY MOUTH EVERY DAY   mirabegron  ER 25 MG Tb24 tablet Commonly known as: MYRBETRIQ Take 1 tablet (25 mg total) by mouth daily.   NONFORMULARY OR COMPOUNDED ITEM Trimix (30/1/10)-(Pap/Phent/PGE)  Test Dose  1ml vial   Qty #3 Refills 0  [facility] [phone] Fax [phone] Started by: Michiel Cowboy   rosuvastatin 40 MG tablet Commonly known as: CRESTOR Take 1 tablet (40 mg total) by mouth daily.   sildenafil 100 MG tablet Commonly known as: Viagra Take 0.5-1 tablets (50-100 mg total) by mouth daily as needed for erectile dysfunction.   tadalafil 20 MG tablet Commonly known as: CIALIS Take 1 tablet (20 mg total) by mouth daily as needed for erectile dysfunction (take 45 minutes prior to sexual activity).   tamsulosin 0.4 MG Caps capsule Commonly known as: FLOMAX Take 1 capsule (0.4 mg total) by mouth daily after supper.        Allergies:  Allergies  Allergen Reactions   Metronidazole Anaphylaxis   Amoxicillin-Pot Clavulanate Other (See Comments)    UNKNOWN   Ciprofloxacin Other (See Comments)    UNKNOWN   Clindamycin/Lincomycin Other (See Comments)    Nausea, stomach pain, sweats   Mesalamine     REACTION: Intolerance    Family History: Family History  Problem Relation Age of Onset   Cancer Mother        unknown type   Snoring Father    Anesthesia problems Neg Hx    Hypotension Neg Hx    Malignant hyperthermia Neg Hx    Pseudochol deficiency Neg Hx    Colon cancer Neg Hx    Colon polyps Neg Hx    Esophageal cancer Neg Hx    Stomach cancer Neg Hx    Rectal cancer Neg Hx     Social History:  reports that he has never smoked. He has never used smokeless tobacco. He reports current alcohol use of about 14.0 standard drinks of alcohol per week. He reports that he does not use drugs.  ROS: Pertinent ROS in HPI  Physical Exam: BP (!) 190/120   Pulse 85   Ht 6\' 3"  (1.905 m)   Wt 190 lb (86.2 kg)   BMI 23.75 kg/m   Constitutional:  Well nourished. Alert and oriented,  No acute distress. HEENT: [place] AT, moist mucus membranes.  Trachea midline Cardiovascular: No clubbing, cyanosis, or edema. Respiratory: Normal respiratory effort, no increased work of breathing. Neurologic: Grossly intact, no focal deficits, moving all 4 extremities. Psychiatric: Normal mood and affect.  Laboratory Data: Lab Results  Component Value Date   WBC 8.9 11/06/2022   HGB 14.0 11/06/2022   HCT 42.6 11/06/2022   MCV 96.8 11/06/2022   PLT 198 11/06/2022    Lab Results  Component Value Date   CREATININE 1.72 (H) 11/06/2022  Component Value Date/Time   CHOL 135 11/06/2022 1227   HDL 45 11/06/2022 1227   CHOLHDL 3.0 11/06/2022 1227   VLDL 35 (H) 08/24/2016 1237   LDLCALC 73 11/06/2022 1227    Lab Results  Component Value Date   AST 14 11/06/2022   Lab Results  Component Value Date   ALT 7 (L) 11/06/2022  I have reviewed the labs.   Pertinent Imaging: N/A  Assessment & Plan:    1. ED -We discussed that since PDE 5 inhibitors were not effective, that other options would be ICI or I could refer him onto a high-volume implanter for consideration for IPP -He would like to have a trial of ICI -I explained that for this, we sent a prescription to the local compounding pharmacy and then he would be scheduled for an appointment with me where we would conduct a titration in office to see what his appropriate dose would be with the medication, I would instruct him on how to inject the medication and also inject phenylephrine to bring down the erection if he should experience 1 in the office -He is agreeable -He has signed the Trimix consent, [facility] and he is scheduled for a titration appointment  2. LU TS/Nocturia -continue Myrbetriq 25 mg daily -encouraged CPAP use  3. Prostate cancer -PSA undetectable   Return for schedule ICI titration appointment .  These notes generated with voice recognition software. I apologize  for typographical errors.  Cloretta Ned  [facility] [address]  [address] [place], [place] [address] [phone]

## 2023-04-05 ENCOUNTER — Encounter: Payer: Self-pay | Admitting: Urology

## 2023-04-05 ENCOUNTER — Ambulatory Visit (INDEPENDENT_AMBULATORY_CARE_PROVIDER_SITE_OTHER): Payer: PPO | Admitting: Urology

## 2023-04-05 VITALS — BP 190/120 | HR 85 | Ht 75.0 in | Wt 190.0 lb

## 2023-04-05 DIAGNOSIS — R399 Unspecified symptoms and signs involving the genitourinary system: Secondary | ICD-10-CM

## 2023-04-05 DIAGNOSIS — N528 Other male erectile dysfunction: Secondary | ICD-10-CM | POA: Diagnosis not present

## 2023-04-05 DIAGNOSIS — C61 Malignant neoplasm of prostate: Secondary | ICD-10-CM | POA: Diagnosis not present

## 2023-04-05 MED ORDER — NONFORMULARY OR COMPOUNDED ITEM
0 refills | Status: DC
Start: 2023-04-05 — End: 2023-07-28

## 2023-04-17 NOTE — Progress Notes (Deleted)
 04/17/2023 11:12 AM  [name] 1940/04/10 [id number]   Referring provider: Donita Brooks, MD [address] [facility] [address] [place],  [place] [address]  Urological history: 1. Prostate cancer -PSA (07/2022) <0.1 -treated with cryotherapy (2011)   2. ED -contributing factors of age, prostate cancer, HTN, CKD, stroke, sleep apnea, neuropathy, BPH, hypothyroidism and alcohol consumption -failed PDE5i's    3. LU TS -contributing factors of age, prostate cancer, HTN, CKD, stroke, sleep apnea, neuropathy, BPH, hypothyroidism and alcohol consumption -Myrbetriq 25 mg daily   4. Nocturia -non compliant with CPAP  No chief complaint on file.  HPI: Matthew Luna is a 83 y.o. male who presents today for Trimix titration.    Previous records reviewed.     The procedure is discussed with patient.  He is allowed to ask questions.  Questions were answered to his satisfaction.  We were able to proceed to the titration.  Physical Exam:  There were no vitals taken for this visit.  Constitutional:  Well nourished. Alert and oriented, No acute distress. GU: No CVA tenderness.  No bladder fullness or masses.  Patient with circumcised/uncircumcised phallus. ***Foreskin easily retracted***  Urethral meatus is patent.  No penile discharge. No penile lesions or rashes.  Psychiatric: Normal mood and affect.   Procedure *** Patient's left corpus cavernosum is identified.  An area near the base of the penis is cleansed with rubbing alcohol.  Careful to avoid the dorsal vein, 2 mcg of Trimix (papaverine 30 mg, phentolamine 1 mg and prostaglandin E1 10 mcg, Lot # ***@*** exp # *** is injected at a 90 degree angle into the left *** corpus cavernosum near the base of the penis.  Patient experienced a very firm erection in 15 minutes.    Patient's right corpus cavernosum is identified.  An area near the base of the penis is cleansed with rubbing alcohol.  Careful to avoid the dorsal vein,  2 mcg of  Trimix (papaverine 30 mg, phentolamine 1 mg and prostaglandin E1 10 mcg, Lot # ***@*** exp # *** is injected at a 90 degree angle into the right corpus cavernosum near the base of the penis.  Patient experienced a very firm erection in 15 minutes.    Patient's left corpus cavernosum is identified.  An area near the base of the penis is cleansed with rubbing alcohol.  Careful to avoid the dorsal vein, 2 mcg of Trimix (papaverine 30 mg, phentolamine 1 mg and prostaglandin E1 10 mcg, Lot # ***@*** exp # *** is injected at a 90 degree angle into the left *** corpus cavernosum near the base of the penis.  Patient experienced a very firm erection in 15 minutes.    Patient's right corpus cavernosum is identified.  An area near the base of the penis is cleansed with rubbing alcohol.  Careful to avoid the dorsal vein, 2 mcg of Trimix (papaverine 30 mg, phentolamine 1 mg and prostaglandin E1 10 mcg, Lot # ***@*** exp # *** is injected at a 90 degree angle into the right corpus cavernosum near the base of the penis.  Patient experienced a very firm erection in 15 minutes.     Assessment & Plan:    1.  Erectile dysfunction -Advised patient of the condition of priapism, painful erection lasting for more than four hours, and to contact the office or seek treatment in the ED immediately      No follow-ups on file.  Michiel Cowboy, PA-C   [facility] [address] [place], [place] [address] [phone]

## 2023-04-18 ENCOUNTER — Other Ambulatory Visit: Payer: PPO

## 2023-04-18 DIAGNOSIS — N528 Other male erectile dysfunction: Secondary | ICD-10-CM

## 2023-04-19 ENCOUNTER — Ambulatory Visit: Admitting: Urology

## 2023-04-19 DIAGNOSIS — N528 Other male erectile dysfunction: Secondary | ICD-10-CM

## 2023-04-19 LAB — TESTOSTERONE: Testosterone: 382 ng/dL (ref 264–916)

## 2023-04-20 ENCOUNTER — Ambulatory Visit: Admitting: Urology

## 2023-04-23 NOTE — Progress Notes (Deleted)
 04/24/2023 9:36 PM   [name] Jan 16, 1941 [id number]  Referring provider: Donita Brooks, MD [address] [facility] [address] [place],  [place] [address]  Urological history: 1. Prostate cancer -PSA (07/2022) <0.1 -treated with cryotherapy (2011)  2. ED -contributing factors of age, prostate cancer, HTN, CKD, stroke, sleep apnea, neuropathy, BPH, hypothyroidism and alcohol consumption -failed PDE5i's   3. LU TS -contributing factors of age, prostate cancer, HTN, CKD, stroke, sleep apnea, neuropathy, BPH, hypothyroidism and alcohol consumption -Myrbetriq 25 mg daily  4. Nocturia -non compliant with CPAP  No chief complaint on file.   HPI: Matthew Luna is a 83 y.o. male who presents today for 6 week follow up.    Previous records reviewed.   At his appointment on 03/08/2023, he was given a trial of tamsulosin.     I PSS ***  PVR ***    Score:  1-7 Mild 8-19 Moderate 20-35 Severe    PMH: Past Medical History:  Diagnosis Date   AAA (abdominal aortic aneurysm) (HCC)    Actinic keratosis    Allergy    mild    Anxiety    CKD (chronic kidney disease) stage 3, GFR 30-59 ml/min (HCC)    Diverticulosis of colon (without mention of hemorrhage)    Duodenitis without mention of hemorrhage    Esophageal reflux    Hypertrophy of prostate without urinary obstruction and other lower urinary tract symptoms (LUTS)    Left sided ulcerative (chronic) colitis (HCC)    Mononeuritis of unspecified site    Neuropathy    Nonspecific abnormal results of thyroid function study    Prostate cancer (HCC)    cryotherapy 2011 ([facility])   Schatzki's ring    Sleep apnea    does not use the CPAP   Stricture and stenosis of esophagus    Stroke (HCC)    left parietal, left parietal/occipital, right cerebellar, right pons   TIA (transient ischemic attack) 2017   pt states was told mini stroke   Unspecified essential hypertension     Surgical History: Past Surgical History:   Procedure Laterality Date   ABDOMINAL AORTIC ANEURYSM REPAIR     2018   BALLOON DILATION  03/22/2011   Procedure: BALLOON DILATION;  Surgeon: Louis Meckel, MD;  Location: [facility];  Service: Endoscopy;  Laterality: N/A;  kelly/ebp   CHOLECYSTECTOMY     COLONOSCOPY     ENDOVASCULAR REPAIR/STENT GRAFT N/A 03/29/2016   Procedure: Endovascular Repair/Stent Graft;  Surgeon: Annice Needy, MD;  Location: [facility]IVE CV [facility];  Service: Cardiovascular;  Laterality: N/A;   ESOPHAGOGASTRODUODENOSCOPY  03/22/2011   Procedure: ESOPHAGOGASTRODUODENOSCOPY (EGD);  Surgeon: Louis Meckel, MD;  Location: [facility];  Service: Endoscopy;  Laterality: N/A;   PROSTATE SURGERY     UPPER GASTROINTESTINAL ENDOSCOPY      Home Medications:  Allergies as of 04/24/2023       Reactions   Metronidazole Anaphylaxis   Amoxicillin-pot Clavulanate Other (See Comments)   UNKNOWN   Ciprofloxacin Other (See Comments)   UNKNOWN   Clindamycin/lincomycin Other (See Comments)   Nausea, stomach pain, sweats   Mesalamine    REACTION: Intolerance        Medication List        Accurate as of April 23, 2023  9:36 PM. If you have any questions, ask your nurse or doctor.          amitriptyline 25 MG tablet Commonly known as: ELAVIL Take 1 tablet (  25 mg total) by mouth at bedtime as needed for sleep (for nerve pain).   gabapentin 300 MG capsule Commonly known as: NEURONTIN Take 1 capsule (300 mg total) by mouth in the morning AND 2 capsules (600 mg total) every evening.   hydrochlorothiazide 12.5 MG capsule Commonly known as: MICROZIDE TAKE 1 CAPSULE BY MOUTH EVERY DAY   mirabegron ER 25 MG Tb24 tablet Commonly known as: MYRBETRIQ Take 1 tablet (25 mg total) by mouth daily.   NONFORMULARY OR COMPOUNDED ITEM Trimix (30/1/10)-(Pap/Phent/PGE)  Test Dose  1ml vial   Qty #3 Refills 0  [facility] [phone] Fax [phone]   rosuvastatin 40 MG tablet Commonly known as:  CRESTOR Take 1 tablet (40 mg total) by mouth daily.   sildenafil 100 MG tablet Commonly known as: Viagra Take 0.5-1 tablets (50-100 mg total) by mouth daily as needed for erectile dysfunction.   tadalafil 20 MG tablet Commonly known as: CIALIS Take 1 tablet (20 mg total) by mouth daily as needed for erectile dysfunction (take 45 minutes prior to sexual activity).   tamsulosin 0.4 MG Caps capsule Commonly known as: FLOMAX Take 1 capsule (0.4 mg total) by mouth daily after supper.        Allergies:  Allergies  Allergen Reactions   Metronidazole Anaphylaxis   Amoxicillin-Pot Clavulanate Other (See Comments)    UNKNOWN   Ciprofloxacin Other (See Comments)    UNKNOWN   Clindamycin/Lincomycin Other (See Comments)    Nausea, stomach pain, sweats   Mesalamine     REACTION: Intolerance    Family History: Family History  Problem Relation Age of Onset   Cancer Mother        unknown type   Snoring Father    Anesthesia problems Neg Hx    Hypotension Neg Hx    Malignant hyperthermia Neg Hx    Pseudochol deficiency Neg Hx    Colon cancer Neg Hx    Colon polyps Neg Hx    Esophageal cancer Neg Hx    Stomach cancer Neg Hx    Rectal cancer Neg Hx     Social History:  reports that he has never smoked. He has never used smokeless tobacco. He reports current alcohol use of about 14.0 standard drinks of alcohol per week. He reports that he does not use drugs.  ROS: Pertinent ROS in HPI  Physical Exam: There were no vitals taken for this visit.  Constitutional:  Well nourished. Alert and oriented, No acute distress. HEENT: [place] AT, moist mucus membranes.  Trachea midline, no masses. Cardiovascular: No clubbing, cyanosis, or edema. Respiratory: Normal respiratory effort, no increased work of breathing. GI: Abdomen is soft, non tender, non distended, no abdominal masses. Liver and spleen not palpable.  No hernias appreciated.  Stool sample for occult testing is not indicated.   GU:  No CVA tenderness.  No bladder fullness or masses.  Patient with circumcised/uncircumcised phallus. ***Foreskin easily retracted***  Urethral meatus is patent.  No penile discharge. No penile lesions or rashes. Scrotum without lesions, cysts, rashes and/or edema.  Testicles are located scrotally bilaterally. No masses are appreciated in the testicles. Left and right epididymis are normal. Rectal: Patient with  normal sphincter tone. Anus and perineum without scarring or rashes. No rectal masses are appreciated. Prostate is approximately *** grams, *** nodules are appreciated. Seminal vesicles are normal. Skin: No rashes, bruises or suspicious lesions. Lymph: No cervical or inguinal adenopathy. Neurologic: Grossly intact, no focal deficits, moving all 4 extremities. Psychiatric: Normal mood  and affect.   Laboratory Data: Results for orders placed or performed in visit on 04/18/23  Testosterone   Collection Time: 04/18/23 10:05 AM  Result Value Ref Range   Testosterone 382 264 - 916 ng/dL  I have reviewed the labs.   Pertinent Imaging: ***  Assessment & Plan:    1. ED -he is scheduled for a Trimix titration in April  2. LU TS/Nocturia -continue Myrbetriq 25 mg daily -encouraged CPAP use  3. Prostate cancer -PSA undetectable   No follow-ups on file.  These notes generated with voice recognition software. I apologize for typographical errors.  Cloretta Ned  [facility] [address]  [address] [place], [place] [address] [phone]

## 2023-04-24 ENCOUNTER — Ambulatory Visit: Payer: PPO | Admitting: Urology

## 2023-04-24 DIAGNOSIS — R399 Unspecified symptoms and signs involving the genitourinary system: Secondary | ICD-10-CM

## 2023-04-24 DIAGNOSIS — N528 Other male erectile dysfunction: Secondary | ICD-10-CM

## 2023-04-24 DIAGNOSIS — C61 Malignant neoplasm of prostate: Secondary | ICD-10-CM

## 2023-05-03 ENCOUNTER — Ambulatory Visit: Admitting: Urology

## 2023-05-10 NOTE — Progress Notes (Deleted)
 05/11/2023 11:22 AM   Matthew Luna [birth year] [id number]  Referring provider: Donita Brooks, MD [address] [facility] [address] [place],  [place] [address]  Urological history: 1. Prostate cancer -PSA (07/2022) <0.1 -treated with cryotherapy (2011)  2. ED -contributing factors of age, prostate cancer, HTN, CKD, stroke, sleep apnea, neuropathy, BPH, hypothyroidism and alcohol consumption -failed PDE5i's   3. LU TS -contributing factors of age, prostate cancer, HTN, CKD, stroke, sleep apnea, neuropathy, BPH, hypothyroidism and alcohol consumption -Myrbetriq 25 mg daily  4. Nocturia -non compliant with CPAP  No chief complaint on file.  HPI: Matthew Luna is a 83 y.o. male who presents today for 6 week follow up.    Previous records reviewed.   At his appointment on 03/08/2023, he was given a trial of tamsulosin.     I PSS ***  PVR ***    Score:  1-7 Mild 8-19 Moderate 20-35 Severe    PMH: Past Medical History:  Diagnosis Date   AAA (abdominal aortic aneurysm) (HCC)    Actinic keratosis    Allergy    mild    Anxiety    CKD (chronic kidney disease) stage 3, GFR 30-59 ml/min (HCC)    Diverticulosis of colon (without mention of hemorrhage)    Duodenitis without mention of hemorrhage    Esophageal reflux    Hypertrophy of prostate without urinary obstruction and other lower urinary tract symptoms (LUTS)    Left sided ulcerative (chronic) colitis (HCC)    Mononeuritis of unspecified site    Neuropathy    Nonspecific abnormal results of thyroid function study    Prostate cancer (HCC)    cryotherapy 2011 ([facility])   Schatzki's ring    Sleep apnea    does not use the CPAP   Stricture and stenosis of esophagus    Stroke (HCC)    left parietal, left parietal/occipital, right cerebellar, right pons   TIA (transient ischemic attack) 2017   pt states was told mini stroke   Unspecified essential hypertension     Surgical History: Past Surgical History:   Procedure Laterality Date   ABDOMINAL AORTIC ANEURYSM REPAIR     2018   BALLOON DILATION  03/22/2011   Procedure: BALLOON DILATION;  Surgeon: Louis Meckel, MD;  Location: [facility];  Service: Endoscopy;  Laterality: N/A;  kelly/ebp   CHOLECYSTECTOMY     COLONOSCOPY     ENDOVASCULAR REPAIR/STENT GRAFT N/A 03/29/2016   Procedure: Endovascular Repair/Stent Graft;  Surgeon: Annice Needy, MD;  Location: [facility];  Service: Cardiovascular;  Laterality: N/A;   ESOPHAGOGASTRODUODENOSCOPY  03/22/2011   Procedure: ESOPHAGOGASTRODUODENOSCOPY (EGD);  Surgeon: Louis Meckel, MD;  Location: [facility];  Service: Endoscopy;  Laterality: N/A;   PROSTATE SURGERY     UPPER GASTROINTESTINAL ENDOSCOPY      Home Medications:  Allergies as of 05/11/2023       Reactions   Metronidazole Anaphylaxis   Amoxicillin-pot Clavulanate Other (See Comments)   UNKNOWN   Ciprofloxacin Other (See Comments)   UNKNOWN   Clindamycin/lincomycin Other (See Comments)   Nausea, stomach pain, sweats   Mesalamine    REACTION: Intolerance        Medication List        Accurate as of May 10, 2023 11:22 AM. If you have any questions, ask your nurse or doctor.          amitriptyline 25 MG tablet Commonly known as: ELAVIL Take 1 tablet (25 mg  total) by mouth at bedtime as needed for sleep (for nerve pain).   gabapentin 300 MG capsule Commonly known as: NEURONTIN Take 1 capsule (300 mg total) by mouth in the morning AND 2 capsules (600 mg total) every evening.   hydrochlorothiazide 12.5 MG capsule Commonly known as: MICROZIDE TAKE 1 CAPSULE BY MOUTH EVERY DAY   mirabegron ER 25 MG Tb24 tablet Commonly known as: MYRBETRIQ Take 1 tablet (25 mg total) by mouth daily.   NONFORMULARY OR COMPOUNDED ITEM Trimix (30/1/10)-(Pap/Phent/PGE)  Test Dose  1ml vial   Qty #3 Refills 0  [facility] [phone] Fax [phone]   rosuvastatin 40 MG tablet Commonly known as:  CRESTOR Take 1 tablet (40 mg total) by mouth daily.   sildenafil 100 MG tablet Commonly known as: Viagra Take 0.5-1 tablets (50-100 mg total) by mouth daily as needed for erectile dysfunction.   tadalafil 20 MG tablet Commonly known as: CIALIS Take 1 tablet (20 mg total) by mouth daily as needed for erectile dysfunction (take 45 minutes prior to sexual activity).   tamsulosin 0.4 MG Caps capsule Commonly known as: FLOMAX Take 1 capsule (0.4 mg total) by mouth daily after supper.        Allergies:  Allergies  Allergen Reactions   Metronidazole Anaphylaxis   Amoxicillin-Pot Clavulanate Other (See Comments)    UNKNOWN   Ciprofloxacin Other (See Comments)    UNKNOWN   Clindamycin/Lincomycin Other (See Comments)    Nausea, stomach pain, sweats   Mesalamine     REACTION: Intolerance    Family History: Family History  Problem Relation Age of Onset   Cancer Mother        unknown type   Snoring Father    Anesthesia problems Neg Hx    Hypotension Neg Hx    Malignant hyperthermia Neg Hx    Pseudochol deficiency Neg Hx    Colon cancer Neg Hx    Colon polyps Neg Hx    Esophageal cancer Neg Hx    Stomach cancer Neg Hx    Rectal cancer Neg Hx     Social History:  reports that he has never smoked. He has never used smokeless tobacco. He reports current alcohol use of about 14.0 standard drinks of alcohol per week. He reports that he does not use drugs.  ROS: Pertinent ROS in HPI  Physical Exam: There were no vitals taken for this visit.  Constitutional:  Well nourished. Alert and oriented, No acute distress. HEENT: [place] AT, moist mucus membranes.  Trachea midline, no masses. Cardiovascular: No clubbing, cyanosis, or edema. Respiratory: Normal respiratory effort, no increased work of breathing. GI: Abdomen is soft, non tender, non distended, no abdominal masses. Liver and spleen not palpable.  No hernias appreciated.  Stool sample for occult testing is not indicated.   GU:  No CVA tenderness.  No bladder fullness or masses.  Patient with circumcised/uncircumcised phallus. ***Foreskin easily retracted***  Urethral meatus is patent.  No penile discharge. No penile lesions or rashes. Scrotum without lesions, cysts, rashes and/or edema.  Testicles are located scrotally bilaterally. No masses are appreciated in the testicles. Left and right epididymis are normal. Rectal: Patient with  normal sphincter tone. Anus and perineum without scarring or rashes. No rectal masses are appreciated. Prostate is approximately *** grams, *** nodules are appreciated. Seminal vesicles are normal. Skin: No rashes, bruises or suspicious lesions. Lymph: No cervical or inguinal adenopathy. Neurologic: Grossly intact, no focal deficits, moving all 4 extremities. Psychiatric: Normal mood and affect.  Laboratory Data: Results for orders placed or performed in visit on 04/18/23  Testosterone   Collection Time: 04/18/23 10:05 AM  Result Value Ref Range   Testosterone 382 264 - 916 ng/dL  I have reviewed the labs.   Pertinent Imaging: ***  Assessment & Plan:    1. ED -needs a Trimix titration appointment  2. LU TS/Nocturia -continue Myrbetriq 25 mg daily -encouraged CPAP use  3. Prostate cancer -PSA undetectable   No follow-ups on file.  These notes generated with voice recognition software. I apologize for typographical errors.  Cloretta Ned  [facility] [address]  [address] [place], [place] [address] [phone]

## 2023-05-11 ENCOUNTER — Ambulatory Visit: Admitting: Urology

## 2023-05-11 DIAGNOSIS — C61 Malignant neoplasm of prostate: Secondary | ICD-10-CM

## 2023-05-11 DIAGNOSIS — N528 Other male erectile dysfunction: Secondary | ICD-10-CM

## 2023-05-11 DIAGNOSIS — R399 Unspecified symptoms and signs involving the genitourinary system: Secondary | ICD-10-CM

## 2023-05-21 ENCOUNTER — Ambulatory Visit: Payer: Self-pay
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
## 2023-05-22 ENCOUNTER — Ambulatory Visit: Admitting: Family Medicine

## 2023-05-28 ENCOUNTER — Ambulatory Visit: Admitting: Family Medicine

## 2023-05-30 ENCOUNTER — Ambulatory Visit: Admitting: Urology

## 2023-06-01 ENCOUNTER — Encounter: Payer: Self-pay | Admitting: Family Medicine

## 2023-06-01 ENCOUNTER — Ambulatory Visit (INDEPENDENT_AMBULATORY_CARE_PROVIDER_SITE_OTHER): Admitting: Family Medicine

## 2023-06-01 VITALS — BP 112/68 | HR 78 | Temp 98.7°F | Ht 75.0 in | Wt 171.0 lb

## 2023-06-01 DIAGNOSIS — G629 Polyneuropathy, unspecified: Secondary | ICD-10-CM | POA: Diagnosis not present

## 2023-06-01 DIAGNOSIS — F01A0 Vascular dementia, mild, without behavioral disturbance, psychotic disturbance, mood disturbance, and anxiety: Secondary | ICD-10-CM | POA: Diagnosis not present

## 2023-06-01 DIAGNOSIS — R634 Abnormal weight loss: Secondary | ICD-10-CM

## 2023-06-01 MED ORDER — ROSUVASTATIN CALCIUM 40 MG PO TABS: 40.0000 mg | ORAL_TABLET | Freq: Every day | ORAL | 3 refills | Status: DC

## 2023-06-01 NOTE — Progress Notes (Signed)
 Subjective:    Patient ID: Matthew Luna, male    DOB: May 31, 1940, 83 y.o.   MRN: [id number] 01/11/23 Patient complains of bilateral foot pain.  The pain occurs primarily at night.  He reports a burning stinging pain in the balls of both feet that covers the entire plantar surface of both feet.  He reports his feet feel hot and cold.  This keeps him awake.  During the day the pain is not bad.  He is currently on gabapentin  300 mg in the morning and 600 mg in the evening however he is currently taking gabapentin  at 5:00 in the afternoon.  He typically goes to bed 10 to 12:00 at night.  He has palpable dorsalis pedis and posterior tibialis pulses bilaterally.  There is no rash.  There is no skin breakdown on either foot.  He has normal reflexes.  He has diminished sensation to 10 g monofilament in all 10 toes.  At that time, my plan was: My biggest concern is polypharmacy in this patient who has some mild age-related memory decline and also deals with frequent dizziness due to atherosclerosis in the arteries to the brain.  I recommended that he try taking gabapentin  600 mg closer to bedtime, 1 hour before bed.  If this does not help, we could either increase the dose of gabapentin  which I am concerned would make dizziness worse or supplement with hydrocodone .  06/01/23 Patient saw no benefit from that plan so when I next saw him, we tried the following: First we will add Elavil  25 mg p.o. nightly and continue gabapentin  300 mg in the morning 600 mg in the evening.  I am hoping that the patient will have improvement in neuropathic pain with the addition of Elavil  and that it may help him sleep at night.  I am afraid to increase the dose of gabapentin  as this would likely cause worsening dizziness in a patient who has dizziness already.  Second I will send the patient to urology to discuss prostaglandin E 1 injections intracavernosal.  We did not have experience with that in his clinic but he has tried and  failed maximum dose sildenafil    Patient is here today and provides conflicting information.  He states that he is here because his feet hurt at night.  However he then tells me that he does not take the gabapentin  because his feet did not hurt at night.  He is uncertain if he is taking the Elavil .  He missed his last 2 appointments.  He also missed an appointment with his urologist.  I am very concerned that he is demonstrating memory loss.  An MRI with pain in the last 12 months showed four previous lacunar infarcts and moderate volume loss due to chronic small vessel disease.  I believe he has vascular dementia.  I explained this to the patient in detail.  He is not taking his aspirin .  He is not taking rosuvastatin .  He is taking hydrochlorothiazide .  His blood pressure today is well-controlled.  However I am concerned because he is lost 12 pounds since I saw him last.  He states that he just does not have an appetite.  He denies abdominal pain.  He denies melena or hematochezia.  He denies nausea or vomiting.  Denies any trouble swallowing or stricture.  He denies any depression. Past Medical History:  Diagnosis Date   AAA (abdominal aortic aneurysm) (HCC)    Actinic keratosis    Allergy  mild    Anxiety    CKD (chronic kidney disease) stage 3, GFR 30-59 ml/min (HCC)    Diverticulosis of colon (without mention of hemorrhage)    Duodenitis without mention of hemorrhage    Esophageal reflux    Hypertrophy of prostate without urinary obstruction and other lower urinary tract symptoms (LUTS)    Left sided ulcerative (chronic) colitis (HCC)    Mononeuritis of unspecified site    Neuropathy    Nonspecific abnormal results of thyroid  function study    Prostate cancer (HCC)    cryotherapy 2011 ([facility])   Schatzki's ring    Sleep apnea    does not use the CPAP   Stricture and stenosis of esophagus    Stroke (HCC)    left parietal, left parietal/occipital, right cerebellar, right pons   TIA  (transient ischemic attack) 2017   pt states was told mini stroke   Unspecified essential hypertension    Past Surgical History:  Procedure Laterality Date   ABDOMINAL AORTIC ANEURYSM REPAIR     2018   BALLOON DILATION  03/22/2011   Procedure: BALLOON DILATION;  Surgeon: Claudette Cue, MD;  Location: [facility];  Service: Endoscopy;  Laterality: N/A;  kelly/ebp   CHOLECYSTECTOMY     COLONOSCOPY     ENDOVASCULAR REPAIR/STENT GRAFT N/A 03/29/2016   Procedure: Endovascular Repair/Stent Graft;  Surgeon: Celso College, MD;  Location: [facility];  Service: Cardiovascular;  Laterality: N/A;   ESOPHAGOGASTRODUODENOSCOPY  03/22/2011   Procedure: ESOPHAGOGASTRODUODENOSCOPY (EGD);  Surgeon: Claudette Cue, MD;  Location: [facility];  Service: Endoscopy;  Laterality: N/A;   PROSTATE SURGERY     UPPER GASTROINTESTINAL ENDOSCOPY     Current Outpatient Medications on File Prior to Visit  Medication Sig Dispense Refill   amitriptyline  (ELAVIL ) 25 MG tablet Take 1 tablet (25 mg total) by mouth at bedtime as needed for sleep (for nerve pain). 30 tablet 1   gabapentin  (NEURONTIN ) 300 MG capsule Take 1 capsule (300 mg total) by mouth in the morning AND 2 capsules (600 mg total) every evening. 120 capsule 0   hydrochlorothiazide  (MICROZIDE ) 12.5 MG capsule TAKE 1 CAPSULE BY MOUTH EVERY DAY 90 capsule 3   mirabegron  ER (MYRBETRIQ ) 25 MG TB24 tablet Take 1 tablet (25 mg total) by mouth daily. 90 tablet 3   NONFORMULARY OR COMPOUNDED ITEM Trimix (30/1/10)-(Pap/Phent/PGE)  Test Dose  1ml vial   Qty #3 Refills 0  [facility] [phone] Fax [phone] 3 each 0   rosuvastatin  (CRESTOR ) 40 MG tablet Take 1 tablet (40 mg total) by mouth daily. 90 tablet 3   sildenafil  (VIAGRA ) 100 MG tablet Take 0.5-1 tablets (50-100 mg total) by mouth daily as needed for erectile dysfunction. 5 tablet 11   tadalafil  (CIALIS ) 20 MG tablet Take 1 tablet (20 mg total) by mouth daily as needed for  erectile dysfunction (take 45 minutes prior to sexual activity). 30 tablet 11   tamsulosin  (FLOMAX ) 0.4 MG CAPS capsule Take 1 capsule (0.4 mg total) by mouth daily after supper. 30 capsule 11   No current facility-administered medications on file prior to visit.   Allergies  Allergen Reactions   Metronidazole Anaphylaxis   Amoxicillin -Pot Clavulanate Other (See Comments)    UNKNOWN   Ciprofloxacin Other (See Comments)    UNKNOWN   Clindamycin /Lincomycin Other (See Comments)    Nausea, stomach pain, sweats   Mesalamine     REACTION: Intolerance   Social History   Socioeconomic History  Marital status: Married    Spouse name: Not on file   Number of children: 1   Years of education: Some colg   Highest education level: Not on file  Occupational History   Occupation: Retired   Tobacco Use   Smoking status: Never   Smokeless tobacco: Never  Vaping Use   Vaping status: Never Used  Substance and Sexual Activity   Alcohol use: Yes    Alcohol/week: 14.0 standard drinks of alcohol    Types: 14 Glasses of wine per week    Comment: per pt -drinks 10 oz red wine every night!   Drug use: No   Sexual activity: Never  Other Topics Concern   Not on file  Social History Narrative   1 -2 cups of coffee a day, some tea consumption    Left handed   Lives with wife. Married x 59 years in 2022.   Truck driver.   Social Drivers of Corporate investment banker Strain: Low Risk  (02/03/2021)   Overall Financial Resource Strain (CARDIA)    Difficulty of Paying Living Expenses: Not hard at all  Food Insecurity: No Food Insecurity (02/03/2021)   Hunger Vital Sign    Worried About Running Out of Food in the Last Year: Never true    Ran Out of Food in the Last Year: Never true  Transportation Needs: No Transportation Needs (02/03/2021)   PRAPARE - Administrator, Civil Service (Medical): No    Lack of Transportation (Non-Medical): No  Physical Activity: Inactive (02/03/2021)    Exercise Vital Sign    Days of Exercise per Week: 0 days    Minutes of Exercise per Session: 0 min  Stress: No Stress Concern Present (02/03/2021)   [employer] of Occupational Health - Occupational Stress Questionnaire    Feeling of Stress : Not at all  Social Connections: Moderately Isolated (02/03/2021)   Social Connection and Isolation Panel [NHANES]    Frequency of Communication with Friends and Family: More than three times a week    Frequency of Social Gatherings with Friends and Family: More than three times a week    Attends Religious Services: Never    Database administrator or Organizations: No    Attends Banker Meetings: Never    Marital Status: Married  Catering manager Violence: Not At Risk (02/03/2021)   Humiliation, Afraid, Rape, and Kick questionnaire    Fear of Current or Ex-Partner: No    Emotionally Abused: No    Physically Abused: No    Sexually Abused: No      Review of Systems  All other systems reviewed and are negative.      Objective:   Physical Exam Vitals reviewed.  Constitutional:      General: He is not in acute distress.    Appearance: He is well-developed. He is not diaphoretic.  Neck:     Thyroid : No thyromegaly.     Vascular: No JVD.  Cardiovascular:     Rate and Rhythm: Normal rate and regular rhythm.     Heart sounds: Normal heart sounds. No murmur heard.    No friction rub. No gallop.  Pulmonary:     Effort: Pulmonary effort is normal. No respiratory distress.     Breath sounds: Normal breath sounds. No wheezing or rales.  Lymphadenopathy:     Cervical: No cervical adenopathy.  Neurological:     Mental Status: He is alert and oriented to person, place, and time.  Motor: Motor function is intact. No tremor, atrophy, abnormal muscle tone or pronator drift.     Coordination: Coordination is intact.           Assessment & Plan:  Mild vascular dementia without behavioral disturbance, psychotic disturbance,  mood disturbance, or anxiety (HCC) - Plan: CBC with Differential/Platelet, COMPLETE METABOLIC PANEL WITHOUT GFR, Lipid panel, TSH, Vitamin B12  Weight loss  Neuropathy Patient definitely is showing cognitive decline.  I believe he likely has vascular dementia based on the results of his MRI.  Strongly encouraged the patient to take aspirin  81 mg a day to prevent future strokes and to take Crestor  40 mg a day to prevent future strokes.  Blood pressure today is well-controlled.  I explained that gabapentin .  I explained to him that he has peripheral neuropathy.  Unfortunately we cannot do anything from a medication standpoint to help treat his numbness or improve balance.  These are chronic problems he has had for years and we have discussed this in the past.  He can use gabapentin  for burning pain in his feet if necessary.  I did encourage him to try a B complex multivitamin.  I am very concerned about his weight loss.  I will check a CBC, CMP, TSH, vitamin B12 to evaluate further

## 2023-06-02 LAB — CBC WITH DIFFERENTIAL/PLATELET
Absolute Lymphocytes: 2110 {cells}/uL (ref 850–3900)
Absolute Monocytes: 475 {cells}/uL (ref 200–950)
Basophils Absolute: 102 {cells}/uL (ref 0–200)
Basophils Relative: 1.4 %
Eosinophils Absolute: 1197 {cells}/uL — ABNORMAL HIGH (ref 15–500)
Eosinophils Relative: 16.4 %
HCT: 39 % (ref 38.5–50.0)
Hemoglobin: 13.1 g/dL — ABNORMAL LOW (ref 13.2–17.1)
MCH: 32.3 pg (ref 27.0–33.0)
MCHC: 33.6 g/dL (ref 32.0–36.0)
MCV: 96.3 fL (ref 80.0–100.0)
MPV: 9.7 fL (ref 7.5–12.5)
Monocytes Relative: 6.5 %
Neutro Abs: 3416 {cells}/uL (ref 1500–7800)
Neutrophils Relative %: 46.8 %
Platelets: 250 10*3/uL (ref 140–400)
RBC: 4.05 10*6/uL — ABNORMAL LOW (ref 4.20–5.80)
RDW: 13.7 % (ref 11.0–15.0)
Total Lymphocyte: 28.9 %
WBC: 7.3 10*3/uL (ref 3.8–10.8)

## 2023-06-02 LAB — COMPLETE METABOLIC PANEL WITHOUT GFR
AG Ratio: 1.5 (calc) (ref 1.0–2.5)
ALT: 6 U/L — ABNORMAL LOW (ref 9–46)
AST: 13 U/L (ref 10–35)
Albumin: 3.9 g/dL (ref 3.6–5.1)
Alkaline phosphatase (APISO): 73 U/L (ref 35–144)
BUN/Creatinine Ratio: 17 (calc) (ref 6–22)
BUN: 37 mg/dL — ABNORMAL HIGH (ref 7–25)
CO2: 30 mmol/L (ref 20–32)
Calcium: 9.6 mg/dL (ref 8.6–10.3)
Chloride: 104 mmol/L (ref 98–110)
Creat: 2.17 mg/dL — ABNORMAL HIGH (ref 0.70–1.22)
Globulin: 2.6 g/dL (ref 1.9–3.7)
Glucose, Bld: 62 mg/dL — ABNORMAL LOW (ref 65–99)
Potassium: 4.7 mmol/L (ref 3.5–5.3)
Sodium: 142 mmol/L (ref 135–146)
Total Bilirubin: 0.7 mg/dL (ref 0.2–1.2)
Total Protein: 6.5 g/dL (ref 6.1–8.1)

## 2023-06-02 LAB — LIPID PANEL
Cholesterol: 177 mg/dL (ref <200)
HDL: 47 mg/dL (ref >=40)
LDL Cholesterol (Calc): 109 mg/dL — ABNORMAL HIGH
Non-HDL Cholesterol (Calc): 130 mg/dL — ABNORMAL HIGH (ref <130)
Total CHOL/HDL Ratio: 3.8 (calc) (ref <5.0)
Triglycerides: 106 mg/dL (ref <150)

## 2023-06-02 LAB — TSH: TSH: 3.31 m[IU]/L (ref 0.40–4.50)

## 2023-06-02 LAB — VITAMIN B12: Vitamin B-12: 378 pg/mL (ref 200–1100)

## 2023-06-04 ENCOUNTER — Other Ambulatory Visit: Payer: Self-pay

## 2023-06-04 DIAGNOSIS — I672 Cerebral atherosclerosis: Secondary | ICD-10-CM

## 2023-06-04 DIAGNOSIS — G459 Transient cerebral ischemic attack, unspecified: Secondary | ICD-10-CM

## 2023-06-04 DIAGNOSIS — F01A0 Vascular dementia, mild, without behavioral disturbance, psychotic disturbance, mood disturbance, and anxiety: Secondary | ICD-10-CM

## 2023-06-04 MED ORDER — ATORVASTATIN CALCIUM 40 MG PO TABS
40.0000 mg | ORAL_TABLET | Freq: Every day | ORAL | 3 refills | Status: DC
Start: 2023-06-04 — End: 2023-07-28

## 2023-07-02 ENCOUNTER — Encounter: Payer: Self-pay | Admitting: Family Medicine

## 2023-07-02 ENCOUNTER — Ambulatory Visit (INDEPENDENT_AMBULATORY_CARE_PROVIDER_SITE_OTHER): Admitting: Family Medicine

## 2023-07-02 VITALS — BP 126/82 | HR 99 | Temp 97.6°F | Ht 75.0 in | Wt 167.8 lb

## 2023-07-02 DIAGNOSIS — G462 Posterior cerebral artery syndrome: Secondary | ICD-10-CM | POA: Diagnosis not present

## 2023-07-02 NOTE — Addendum Note (Signed)
 Addended by: Eliane Grooms T on: 07/02/2023 02:43 PM   Modules accepted: Orders

## 2023-07-02 NOTE — Progress Notes (Signed)
 Subjective:    Patient ID: Matthew Luna, male    DOB: 09-Feb-1940, 83 y.o.   MRN: [id number] 01/11/23 Patient complains of bilateral foot pain.  The pain occurs primarily at night.  He reports a burning stinging pain in the balls of both feet that covers the entire plantar surface of both feet.  He reports his feet feel hot and cold.  This keeps him awake.  During the day the pain is not bad.  He is currently on gabapentin  300 mg in the morning and 600 mg in the evening however he is currently taking gabapentin  at 5:00 in the afternoon.  He typically goes to bed 10 to 12:00 at night.  He has palpable dorsalis pedis and posterior tibialis pulses bilaterally.  There is no rash.  There is no skin breakdown on either foot.  He has normal reflexes.  He has diminished sensation to 10 g monofilament in all 10 toes.  At that time, my plan was: My biggest concern is polypharmacy in this patient who has some mild age-related memory decline and also deals with frequent dizziness due to atherosclerosis in the arteries to the brain.  I recommended that he try taking gabapentin  600 mg closer to bedtime, 1 hour before bed.  If this does not help, we could either increase the dose of gabapentin  which I am concerned would make dizziness worse or supplement with hydrocodone .  06/01/23 Patient saw no benefit from that plan so when I next saw him, we tried the following: First we will add Elavil  25 mg p.o. nightly and continue gabapentin  300 mg in the morning 600 mg in the evening.  I am hoping that the patient will have improvement in neuropathic pain with the addition of Elavil  and that it may help him sleep at night.  I am afraid to increase the dose of gabapentin  as this would likely cause worsening dizziness in a patient who has dizziness already.  Second I will send the patient to urology to discuss prostaglandin E 1 injections intracavernosal.  We did not have experience with that in his clinic but he has tried and  failed maximum dose sildenafil    Patient is here today and provides conflicting information.  He states that he is here because his feet hurt at night.  However he then tells me that he does not take the gabapentin  because his feet did not hurt at night.  He is uncertain if he is taking the Elavil .  He missed his last 2 appointments.  He also missed an appointment with his urologist.  I am very concerned that he is demonstrating memory loss.  An MRI with pain in the last 12 months showed four previous lacunar infarcts and moderate volume loss due to chronic small vessel disease.  I believe he has vascular dementia.  I explained this to the patient in detail.  He is not taking his aspirin .  He is not taking rosuvastatin .  He is taking hydrochlorothiazide .  His blood pressure today is well-controlled.  However I am concerned because he is lost 12 pounds since I saw him last.  He states that he just does not have an appetite.  He denies abdominal pain.  He denies melena or hematochezia.  He denies nausea or vomiting.  Denies any trouble swallowing or stricture.  He denies any depression.  At that time, my plan was: Patient definitely is showing cognitive decline.  I believe he likely has vascular dementia based on the results  of his MRI.  Strongly encouraged the patient to take aspirin  81 mg a day to prevent future strokes and to take Crestor  40 mg a day to prevent future strokes.  Blood pressure today is well-controlled.  I explained that gabapentin .  I explained to him that he has peripheral neuropathy.  Unfortunately we cannot do anything from a medication standpoint to help treat his numbness or improve balance.  These are chronic problems he has had for years and we have discussed this in the past.  He can use gabapentin  for burning pain in his feet if necessary.  I did encourage him to try a B complex multivitamin.  I am very concerned about his weight loss.  I will check a CBC, CMP, TSH, vitamin B12 to  evaluate further  07/02/23 On labs cr had risen from 1.7 to 2.17 and I switched crestor  to lipitor  due to ckd.  Patient is here today complaining of worsening balance.  He recently fell and hit the right side of his face on the refrigerator door.  He has a scrape above his right eye and bruising around his right orbit.  On an MRI in 2022 with an angiogram of the head neck, the patient was found to have severe disease in the bilateral posterior cerebral arteries.  On the left-hand side between the P1 and P2 junction was a severe stenosis.  On the right-hand side there was severe stenosis of P1.  However due to a patent posterior communicating artery, the patient had flown in the distal posterior cerebral artery.  At that time neurology consultation recommended permissive hypertension to avoid dizziness.  Unfortunately depends dizziness 1 he is now starting to fall more frequently.  He also complains of blurry vision. No visits with results within 1 Month(s) from this visit.  Latest known visit with results is:  Office Visit on 06/01/2023  Component Date Value Ref Range Status   WBC 06/01/2023 7.3  3.8 - 10.8 Thousand/uL Final   RBC 06/01/2023 4.05 (L)  4.20 - 5.80 Million/uL Final   Hemoglobin 06/01/2023 13.1 (L)  13.2 - 17.1 g/dL Final   HCT 60/45/4098 39.0  38.5 - 50.0 % Final   MCV 06/01/2023 96.3  80.0 - 100.0 fL Final   MCH 06/01/2023 32.3  27.0 - 33.0 pg Final   MCHC 06/01/2023 33.6  32.0 - 36.0 g/dL Final   Comment: For adults, a slight decrease in the calculated MCHC value (in the range of 30 to 32 g/dL) is most likely not clinically significant; however, it should be interpreted with caution in correlation with other red cell parameters and the patient's clinical condition.    RDW 06/01/2023 13.7  11.0 - 15.0 % Final   Platelets 06/01/2023 250  140 - 400 Thousand/uL Final   MPV 06/01/2023 9.7  7.5 - 12.5 fL Final   Neutro Abs 06/01/2023 3,416  1,500 - 7,800 cells/uL Final   Absolute  Lymphocytes 06/01/2023 2,110  850 - 3,900 cells/uL Final   Absolute Monocytes 06/01/2023 475  200 - 950 cells/uL Final   Eosinophils Absolute 06/01/2023 1,197 (H)  15 - 500 cells/uL Final   Basophils Absolute 06/01/2023 102  0 - 200 cells/uL Final   Neutrophils Relative % 06/01/2023 46.8  % Final   Total Lymphocyte 06/01/2023 28.9  % Final   Monocytes Relative 06/01/2023 6.5  % Final   Eosinophils Relative 06/01/2023 16.4  % Final   Basophils Relative 06/01/2023 1.4  % Final   Glucose, Bld 06/01/2023  62 (L)  65 - 99 mg/dL Final   Comment: .            Fasting reference interval .    BUN 06/01/2023 37 (H)  7 - 25 mg/dL Final   Creat 86/57/8469 2.17 (H)  0.70 - 1.22 mg/dL Final   BUN/Creatinine Ratio 06/01/2023 17  6 - 22 (calc) Final   Sodium 06/01/2023 142  135 - 146 mmol/L Final   Potassium 06/01/2023 4.7  3.5 - 5.3 mmol/L Final   Chloride 06/01/2023 104  98 - 110 mmol/L Final   CO2 06/01/2023 30  20 - 32 mmol/L Final   Calcium  06/01/2023 9.6  8.6 - 10.3 mg/dL Final   Total Protein 62/95/2841 6.5  6.1 - 8.1 g/dL Final   Albumin 32/44/0102 3.9  3.6 - 5.1 g/dL Final   Globulin 72/53/6644 2.6  1.9 - 3.7 g/dL (calc) Final   AG Ratio 06/01/2023 1.5  1.0 - 2.5 (calc) Final   Total Bilirubin 06/01/2023 0.7  0.2 - 1.2 mg/dL Final   Alkaline phosphatase (APISO) 06/01/2023 73  35 - 144 U/L Final   AST 06/01/2023 13  10 - 35 U/L Final   ALT 06/01/2023 6 (L)  9 - 46 U/L Final   Cholesterol 06/01/2023 177  <200 mg/dL Final   HDL 03/47/4259 47  > OR = 40 mg/dL Final   Triglycerides 56/38/7564 106  <150 mg/dL Final   LDL Cholesterol (Calc) 06/01/2023 109 (H)  mg/dL (calc) Final   Comment: Reference range: <100 . Desirable range <100 mg/dL for primary prevention;   <70 mg/dL for patients with CHD or diabetic patients  with > or = 2 CHD risk factors. Aaron Aas LDL-C is now calculated using the Martin-Hopkins  calculation, which is a validated novel method providing  better accuracy than the  Friedewald equation in the  estimation of LDL-C.  Melinda Sprawls et al. Erroll Heard. 3329;518(84): 2061-2068  (http://education.[facility].com/faq/FAQ164)    Total CHOL/HDL Ratio 06/01/2023 3.8  <1.6 (calc) Final   Non-HDL Cholesterol (Calc) 06/01/2023 130 (H)  <130 mg/dL (calc) Final   Comment: For patients with diabetes plus 1 major ASCVD risk  factor, treating to a non-HDL-C goal of <100 mg/dL  (LDL-C of <60 mg/dL) is considered a therapeutic  option.    TSH 06/01/2023 3.31  0.40 - 4.50 mIU/L Final   Vitamin B-12 06/01/2023 378  200 - 1,100 pg/mL Final   Comment: . Please Note: Although the reference range for vitamin B12 is [phone] pg/mL, it has been reported that between 5 and 10% of patients with values between 200 and 400 pg/mL may experience neuropsychiatric and hematologic abnormalities due to occult B12 deficiency; less than 1% of patients with values above 400 pg/mL will have symptoms. .     Past Medical History:  Diagnosis Date   AAA (abdominal aortic aneurysm) (HCC)    Actinic keratosis    Allergy    mild    Anxiety    CKD (chronic kidney disease) stage 3, GFR 30-59 ml/min (HCC)    Diverticulosis of colon (without mention of hemorrhage)    Duodenitis without mention of hemorrhage    Esophageal reflux    Hypertrophy of prostate without urinary obstruction and other lower urinary tract symptoms (LUTS)    Left sided ulcerative (chronic) colitis (HCC)    Mononeuritis of unspecified site    Neuropathy    Nonspecific abnormal results of thyroid  function study    Prostate cancer (HCC)    cryotherapy 2011 [facility])  Schatzki's ring    Sleep apnea    does not use the CPAP   Stricture and stenosis of esophagus    Stroke (HCC)    left parietal, left parietal/occipital, right cerebellar, right pons   TIA (transient ischemic attack) 2017   pt states was told mini stroke   Unspecified essential hypertension    Past Surgical History:  Procedure Laterality Date   ABDOMINAL  AORTIC ANEURYSM REPAIR     2018   BALLOON DILATION  03/22/2011   Procedure: BALLOON DILATION;  Surgeon: Claudette Cue, MD;  Location: [facility];  Service: Endoscopy;  Laterality: N/A;  kelly/ebp   CHOLECYSTECTOMY     COLONOSCOPY     ENDOVASCULAR REPAIR/STENT GRAFT N/A 03/29/2016   Procedure: Endovascular Repair/Stent Graft;  Surgeon: Celso College, MD;  Location: [facility] INVASIVE CV LAB;  Service: Cardiovascular;  Laterality: N/A;   ESOPHAGOGASTRODUODENOSCOPY  03/22/2011   Procedure: ESOPHAGOGASTRODUODENOSCOPY (EGD);  Surgeon: Claudette Cue, MD;  Location: [facility];  Service: Endoscopy;  Laterality: N/A;   PROSTATE SURGERY     UPPER GASTROINTESTINAL ENDOSCOPY     Current Outpatient Medications on File Prior to Visit  Medication Sig Dispense Refill   amitriptyline  (ELAVIL ) 25 MG tablet Take 1 tablet (25 mg total) by mouth at bedtime as needed for sleep (for nerve pain). 30 tablet 1   atorvastatin  (LIPITOR ) 40 MG tablet Take 1 tablet (40 mg total) by mouth daily. 90 tablet 3   gabapentin  (NEURONTIN ) 300 MG capsule Take 1 capsule (300 mg total) by mouth in the morning AND 2 capsules (600 mg total) every evening. 120 capsule 0   hydrochlorothiazide  (MICROZIDE ) 12.5 MG capsule TAKE 1 CAPSULE BY MOUTH EVERY DAY 90 capsule 3   mirabegron  ER (MYRBETRIQ ) 25 MG TB24 tablet Take 1 tablet (25 mg total) by mouth daily. 90 tablet 3   NONFORMULARY OR COMPOUNDED ITEM Trimix (30/1/10)-(Pap/Phent/PGE)  Test Dose  1ml vial   Qty #3 Refills 0  [facility] [phone] Fax [phone] 3 each 0   sildenafil  (VIAGRA ) 100 MG tablet Take 0.5-1 tablets (50-100 mg total) by mouth daily as needed for erectile dysfunction. 5 tablet 11   tadalafil  (CIALIS ) 20 MG tablet Take 1 tablet (20 mg total) by mouth daily as needed for erectile dysfunction (take 45 minutes prior to sexual activity). 30 tablet 11   tamsulosin  (FLOMAX ) 0.4 MG CAPS capsule Take 1 capsule (0.4 mg total) by mouth daily after supper.  30 capsule 11   No current facility-administered medications on file prior to visit.   Allergies  Allergen Reactions   Metronidazole Anaphylaxis   Amoxicillin -Pot Clavulanate Other (See Comments)    UNKNOWN   Ciprofloxacin Other (See Comments)    UNKNOWN   Clindamycin /Lincomycin Other (See Comments)    Nausea, stomach pain, sweats   Mesalamine     REACTION: Intolerance   Social History   Socioeconomic History   Marital status: Married    Spouse name: Not on file   Number of children: 1   Years of education: Some colg   Highest education level: Not on file  Occupational History   Occupation: Retired   Tobacco Use   Smoking status: Never   Smokeless tobacco: Never  Vaping Use   Vaping status: Never Used  Substance and Sexual Activity   Alcohol use: Yes    Alcohol/week: 14.0 standard drinks of alcohol    Types: 14 Glasses of wine per week    Comment: per pt -drinks 10 oz red  wine every night!   Drug use: No   Sexual activity: Never  Other Topics Concern   Not on file  Social History Narrative   1 -2 cups of coffee a day, some tea consumption    Left handed   Lives with wife. Married x 59 years in 2022.   Truck driver.   Social Drivers of Corporate investment banker Strain: Low Risk  (02/03/2021)   Overall Financial Resource Strain (CARDIA)    Difficulty of Paying Living Expenses: Not hard at all  Food Insecurity: No Food Insecurity (02/03/2021)   Hunger Vital Sign    Worried About Running Out of Food in the Last Year: Never true    Ran Out of Food in the Last Year: Never true  Transportation Needs: No Transportation Needs (02/03/2021)   PRAPARE - Administrator, Civil Service (Medical): No    Lack of Transportation (Non-Medical): No  Physical Activity: Inactive (02/03/2021)   Exercise Vital Sign    Days of Exercise per Week: 0 days    Minutes of Exercise per Session: 0 min  Stress: No Stress Concern Present (02/03/2021)   [employer] of Occupational  Health - Occupational Stress Questionnaire    Feeling of Stress : Not at all  Social Connections: Moderately Isolated (02/03/2021)   Social Connection and Isolation Panel [NHANES]    Frequency of Communication with Friends and Family: More than three times a week    Frequency of Social Gatherings with Friends and Family: More than three times a week    Attends Religious Services: Never    Database administrator or Organizations: No    Attends Banker Meetings: Never    Marital Status: Married  Catering manager Violence: Not At Risk (02/03/2021)   Humiliation, Afraid, Rape, and Kick questionnaire    Fear of Current or Ex-Partner: No    Emotionally Abused: No    Physically Abused: No    Sexually Abused: No      Review of Systems  All other systems reviewed and are negative.      Objective:   Physical Exam Vitals reviewed.  Constitutional:      General: He is not in acute distress.    Appearance: He is well-developed. He is not diaphoretic.  Neck:     Thyroid : No thyromegaly.     Vascular: No JVD.  Cardiovascular:     Rate and Rhythm: Normal rate and regular rhythm.     Heart sounds: Normal heart sounds. No murmur heard.    No friction rub. No gallop.  Pulmonary:     Effort: Pulmonary effort is normal. No respiratory distress.     Breath sounds: Normal breath sounds. No wheezing or rales.  Lymphadenopathy:     Cervical: No cervical adenopathy.  Neurological:     Mental Status: He is alert and oriented to person, place, and time.     Motor: Motor function is intact. No tremor, atrophy, abnormal muscle tone or pronator drift.     Coordination: Coordination is intact.           Assessment & Plan:  Posterior cerebral artery syndrome Patient reports chronic dizziness.  He also has dyspraxia.  He has been falling more frequently.  He denies any diplopia although he does have blurry vision.  He denies any swallowing difficulties.  He denies any trouble with  speech or perception.  He is demonstrating mild cognitive impairment.  I have the patient on an  aspirin  as well as atorvastatin  due to his worsening renal function.  I am going to schedule the patient to discuss with neurology to see if there are any other options they may be able to offer him to improve circulation in the posterior system.  Meanwhile I recommended the patient discontinue gabapentin  due to his worsening renal function and his dizziness

## 2023-07-05 ENCOUNTER — Telehealth: Payer: Self-pay

## 2023-07-05 NOTE — Telephone Encounter (Signed)
 From: Rodman Clam Sent: 07/04/2023   1:13 PM EDT To: Adelene Homer,   This is Nikki with [facility]. It looks like patient did see us  back in 2016, but has more recently seen Dr. Festus Hubert at [facility]. For continuity of care, we usually recommend patients follow up with their established neurologist, but is he not wanting to go back? Thanks!

## 2023-07-08 ENCOUNTER — Encounter (HOSPITAL_COMMUNITY): Payer: Self-pay | Admitting: *Deleted

## 2023-07-08 ENCOUNTER — Emergency Department (HOSPITAL_COMMUNITY)

## 2023-07-08 ENCOUNTER — Other Ambulatory Visit: Payer: Self-pay

## 2023-07-08 ENCOUNTER — Emergency Department (HOSPITAL_COMMUNITY)
Admission: EM | Admit: 2023-07-08 | Discharge: 2023-07-08 | Disposition: A | Discharge status: Home / Self Care | Attending: Emergency Medicine | Admitting: Emergency Medicine

## 2023-07-08 DIAGNOSIS — Z043 Encounter for examination and observation following other accident: Secondary | ICD-10-CM | POA: Diagnosis not present

## 2023-07-08 DIAGNOSIS — K5730 Diverticulosis of large intestine without perforation or abscess without bleeding: Secondary | ICD-10-CM | POA: Diagnosis not present

## 2023-07-08 DIAGNOSIS — R102 Pelvic and perineal pain: Secondary | ICD-10-CM | POA: Diagnosis not present

## 2023-07-08 DIAGNOSIS — W19XXXA Unspecified fall, initial encounter: Secondary | ICD-10-CM | POA: Diagnosis not present

## 2023-07-08 DIAGNOSIS — S3210XA Unspecified fracture of sacrum, initial encounter for closed fracture: Secondary | ICD-10-CM | POA: Insufficient documentation

## 2023-07-08 DIAGNOSIS — M533 Sacrococcygeal disorders, not elsewhere classified: Secondary | ICD-10-CM | POA: Diagnosis not present

## 2023-07-08 DIAGNOSIS — S0990XA Unspecified injury of head, initial encounter: Secondary | ICD-10-CM | POA: Insufficient documentation

## 2023-07-08 DIAGNOSIS — G9389 Other specified disorders of brain: Secondary | ICD-10-CM | POA: Diagnosis not present

## 2023-07-08 DIAGNOSIS — I714 Abdominal aortic aneurysm, without rupture, unspecified: Secondary | ICD-10-CM | POA: Diagnosis not present

## 2023-07-08 DIAGNOSIS — M48061 Spinal stenosis, lumbar region without neurogenic claudication: Secondary | ICD-10-CM | POA: Diagnosis not present

## 2023-07-08 DIAGNOSIS — I6782 Cerebral ischemia: Secondary | ICD-10-CM | POA: Diagnosis not present

## 2023-07-08 DIAGNOSIS — S3992XA Unspecified injury of lower back, initial encounter: Secondary | ICD-10-CM | POA: Diagnosis present

## 2023-07-08 MED ORDER — HYDROCODONE-ACETAMINOPHEN 5-325 MG PO TABS: 0.5000 | ORAL_TABLET | Freq: Four times a day (QID) | ORAL | 0 refills | Status: DC | PRN

## 2023-07-08 MED ORDER — HYDROCODONE-ACETAMINOPHEN 5-325 MG PO TABS
1.0000 | ORAL_TABLET | Freq: Once | ORAL | Status: AC
  Administered 2023-07-08: 1 via ORAL
  Filled 2023-07-08: qty 1

## 2023-07-08 NOTE — Discharge Instructions (Addendum)
 Please follow-up closely with your primary care doctor and neurosurgery on outpatient basis.  Return to emergency department immediately for any new or worsening symptoms.  Please avoid much activity while taking the pain medication.  He may continue to take Motrin  as needed for your pain as well.

## 2023-07-08 NOTE — ED Triage Notes (Signed)
 Pt with falls x 2 over the past 1.5 weeks.  Hit his head with both falls.  Pt with tailbone pain, uncomfortable with sitting. Pt with known balance problems and is not using any can/walker with ambulating.

## 2023-07-08 NOTE — ED Provider Notes (Signed)
 [place] EMERGENCY DEPARTMENT AT [facility] Provider Note   CSN: [id number] Arrival date & time: 07/08/23  1028     History  Chief Complaint  Patient presents with   Matthew Luna is a 83 y.o. male.  Patient is an 83 year old male who presents to the emergency department with a chief complaint of a fall approximately 2 days ago in front of his refrigerator.  He notes that he did attempt to catch something falling out of his refrigerator causing him to fall.  He notes that he did fall onto his buttocks and did strike his head.  He denies any active headache or pain to his neck at this point.  He denies any long bone or joint pain.  He is complaining of pain along his buttocks.  He denies any syncopal events.  He is already being worked up outpatient for chronic dizziness.  His son is at the bedside at this time who provide history.   Fall       Home Medications Prior to Admission medications   Medication Sig Start Date End Date Taking? Authorizing Provider  HYDROcodone -acetaminophen  (NORCO/VICODIN) 5-325 MG tablet Take 0.5 tablets by mouth every 6 (six) hours as needed for severe pain (pain score 7-10). 07/08/23  Yes Demika Langenderfer D, PA-C  atorvastatin  (LIPITOR ) 40 MG tablet Take 1 tablet (40 mg total) by mouth daily. 06/04/23   Austine Lefort, MD  hydrochlorothiazide  (MICROZIDE ) 12.5 MG capsule TAKE 1 CAPSULE BY MOUTH EVERY DAY 12/29/20   Wilhemena Harbour, NP  mirabegron  ER (MYRBETRIQ ) 25 MG TB24 tablet Take 1 tablet (25 mg total) by mouth daily. 11/06/22   Jenelle Mis, FNP  NONFORMULARY OR COMPOUNDED ITEM Trimix (30/1/10)-(Pap/Phent/PGE)  Test Dose  1ml vial   Qty #3 Refills 0  [facility] [phone] Fax [phone] 04/05/23   Matilde Son A, PA-C  tamsulosin  (FLOMAX ) 0.4 MG CAPS capsule Take 1 capsule (0.4 mg total) by mouth daily after supper. 03/08/23   Lawerence Pressman, MD      Allergies    Metronidazole, Amoxicillin -pot  clavulanate, Ciprofloxacin, Clindamycin /lincomycin, and Mesalamine    Review of Systems   Review of Systems  Musculoskeletal:        Low back pain and buttocks pain  All other systems reviewed and are negative.   Physical Exam Updated Vital Signs BP (!) 127/106 (BP Location: Right Arm)   Pulse 93   Temp (!) 97.3 F (36.3 C) (Temporal)   Resp 16   Ht 6' 3 (1.905 m)   Wt 76.1 kg   SpO2 99%   BMI 20.97 kg/m  Physical Exam Vitals and nursing note reviewed.  Constitutional:      Appearance: Normal appearance.  HENT:     Head: Normocephalic and atraumatic.     Nose: Nose normal.     Mouth/Throat:     Mouth: Mucous membranes are moist.  Eyes:     Extraocular Movements: Extraocular movements intact.     Conjunctiva/sclera: Conjunctivae normal.     Pupils: Pupils are equal, round, and reactive to light.  Cardiovascular:     Rate and Rhythm: Normal rate and regular rhythm.     Pulses: Normal pulses.     Heart sounds: Normal heart sounds. No murmur heard.    No gallop.  Pulmonary:     Effort: Pulmonary effort is normal. No respiratory distress.     Breath sounds: Normal breath sounds. No wheezing or rales.  Abdominal:     General: Abdomen is flat. Bowel sounds are normal. There is no distension.     Palpations: Abdomen is soft.     Tenderness: There is no abdominal tenderness. There is no guarding.  Musculoskeletal:        General: Normal range of motion.     Cervical back: Normal range of motion and neck supple.     Comments: Nontender palpation of bilateral upper and lower extremities, peripheral pulses 2+, sensation intact throughout, nontender palpation over thoracic or lumbar spine, tenderness noted over the sacrum, no midline step-off or deformity, no CVA tenderness  Skin:    General: Skin is warm and dry.     Findings: No bruising or rash.  Neurological:     General: No focal deficit present.     Mental Status: He is alert and oriented to person, place, and time.  Mental status is at baseline.     Cranial Nerves: No cranial nerve deficit.     Sensory: No sensory deficit.     Motor: No weakness.     Coordination: Coordination normal.     Gait: Gait normal.  Psychiatric:        Mood and Affect: Mood normal.        Behavior: Behavior normal.        Thought Content: Thought content normal.        Judgment: Judgment normal.     ED Results / Procedures / Treatments   Labs (all labs ordered are listed, but only abnormal results are displayed) Labs Reviewed - No data to display  EKG None  Radiology CT PELVIS WO CONTRAST Result Date: 07/08/2023 CLINICAL DATA:  fall, posterior pelvis pain.  Trauma EXAM: CT PELVIS WITHOUT CONTRAST TECHNIQUE: Multidetector CT imaging of the pelvis was performed following the standard protocol without intravenous contrast. RADIATION DOSE REDUCTION: This exam was performed according to the departmental dose-optimization program which includes automated exposure control, adjustment of the mA and/or kV according to patient size and/or use of iterative reconstruction technique. COMPARISON:  CT abdomen pelvis 03/16/2016 FINDINGS: Urinary Tract:  No abnormality visualized. Bowel: Colonic diverticulosis. Otherwise unremarkable visualized pelvic bowel loops. Vascular/Lymphatic: Partially visualized aorto bi-iliac stent graft repair. No enlarged lymph nodes. No significant vascular abnormality seen. Reproductive:  Unremarkable prostate. Other: No intraperitoneal free fluid. No intraperitoneal free gas. No organized fluid collection. Musculoskeletal: No acute displaced fracture or dislocation of either hips. No acute displaced fracture or diastasis of the bones of the pelvis. Multilevel degenerative changes of visualized lower lumbar spine. Acute nondisplaced fracture of the S2 and S3 level extending to the left S2 sacral foramina. IMPRESSION: 1. Acute nondisplaced fracture of the S2 and S3 level extending to the left S2 sacral foramina. 2.  Partially visualized aorto bi-iliac stent graft repair. 3. Colonic diverticulosis. Electronically Signed   By: Morgane  Naveau M.D.   On: 07/08/2023 12:25   CT Lumbar Spine Wo Contrast Result Date: 07/08/2023 CLINICAL DATA:  83 year old male with multiple recent falls. Persistent tailbone pain. EXAM: CT LUMBAR SPINE WITHOUT CONTRAST TECHNIQUE: Multidetector CT imaging of the lumbar spine was performed without intravenous contrast administration. Multiplanar CT image reconstructions were also generated. RADIATION DOSE REDUCTION: This exam was performed according to the departmental dose-optimization program which includes automated exposure control, adjustment of the mA and/or kV according to patient size and/or use of iterative reconstruction technique. COMPARISON:  CT Abdomen and Pelvis 03/16/2016. FINDINGS: Segmentation: Assuming normal lumbar segmentation results in hypoplastic or absent ribs at T12.  Correlation with radiographs is recommended prior to any operative intervention. Alignment: Maintained lumbar lordosis. No significant lumbar scoliosis or spondylolisthesis. Vertebrae: Visible lower thoracic levels appear intact. No lumbar vertebral fracture identified. Maintained lumbar vertebral height. Mildly displaced S2 sacral fracture best seen on series 5, image 37. Associated fracture continuation through the left ala between the left S1 and S2 neural foramina visible on series 3, image 118, series 6, image 77. And fracture through the bilateral posterior elements seen also on sagittal images 32 and 39. Mild displacement at the central sacrum as per the sagittal image. Visible SI joints remain intact. Lower sacral segments and coccyx not included. Paraspinal and other soft tissues: Chronic bifurcated endograft repair of abdominal aortic aneurysm, noncontrast appearance satisfactory. Chronic staghorn right renal calculi and right renal atrophy. Less pronounced left nephrolithiasis, left renal probable  parapelvic cysts appear stable. Large bowel diverticulosis. Cholecystectomy. Disc levels: Advanced lumbar spine disc and endplate degeneration L2-L3 through L4-L5 with vacuum disc. Mild to moderate L2-L3, moderate to severe L3-L4 (series 4, image 77), L4-L5 (series 4, image 91) multifactorial degenerative spinal stenosis. IMPRESSION: 1. Positive for a mildly displaced S2 Sacral Fracture. Left greater than right sacral ala involvement. 2. No lumbar vertebral fracture identified. Lumbar spine degeneration with multifactorial multilevel lower lumbar spinal stenosis. 3. Previous bifurcated endograft repair of abdominal aortic aneurysm. Chronic right renal staghorn calculi and renal atrophy. Aortic Atherosclerosis (ICD10-I70.0). Electronically Signed   By: Marlise Simpers M.D.   On: 07/08/2023 12:17   CT Head Wo Contrast Result Date: 07/08/2023 CLINICAL DATA:  Head trauma, minor (Age >= 65y) EXAM: CT HEAD WITHOUT CONTRAST TECHNIQUE: Contiguous axial images were obtained from the base of the skull through the vertex without intravenous contrast. RADIATION DOSE REDUCTION: This exam was performed according to the departmental dose-optimization program which includes automated exposure control, adjustment of the mA and/or kV according to patient size and/or use of iterative reconstruction technique. COMPARISON:  11/15/2020. FINDINGS: Brain: There is periventricular white matter decreased attenuation consistent with small vessel ischemic changes. Ventricles, sulci and cisterns are prominent consistent with age related involutional changes. No acute intracranial hemorrhage, mass effect or shift. No hydrocephalus. Foci of encephalomalacia consistent with chronic basal ganglia, periventricular white matter and cerebellar lacunar CVAs. Vascular: No hyperdense vessel or unexpected calcification. Skull: Normal. Negative for fracture or focal lesion. Sinuses/Orbits: No acute finding. IMPRESSION: Atrophy and chronic small vessel ischemic  changes. No acute intracranial process identified. Electronically Signed   By: Sydell Eva M.D.   On: 07/08/2023 12:14    Procedures Procedures    Medications Ordered in ED Medications  HYDROcodone -acetaminophen  (NORCO/VICODIN) 5-325 MG per tablet 1 tablet (1 tablet Oral Given 07/08/23 1213)    ED Course/ Medical Decision Making/ A&P                                 Medical Decision Making Patient is doing well at this time and is stable for discharge home.  Discussed with patient and son that he does have a sacral fracture at this point.  Will continue symptomatic treatment on outpatient basis and recommend close follow-up with neurosurgery.  Patient has no concerning neurological deficits at this point do not suspect any further advanced imaging of the back or pelvis is warranted.  He has no indication for active bleeding at this time.  Did discuss with the patient to use caution when taking the pain medications.  Strict turn precautions  were provided for any new or worsening symptoms.  Patient voiced understanding to the plan and had no additional questions. Imaging and case were discussed with attending physician who is in agreement to plan at this time.  Did discuss the need for use of walker with ambulation.  Amount and/or Complexity of Data Reviewed Radiology: ordered.  Risk Prescription drug management.           Final Clinical Impression(s) / ED Diagnoses Final diagnoses:  Closed fracture of sacrum, unspecified portion of sacrum, initial encounter [facility])    Rx / DC Orders ED Discharge Orders          Ordered    HYDROcodone -acetaminophen  (NORCO/VICODIN) 5-325 MG tablet  Every 6 hours PRN        07/08/23 1252              Roselynn Connors, PA-C 07/08/23 1308    Guadalupe Lee, MD 07/17/23 1014

## 2023-07-13 ENCOUNTER — Other Ambulatory Visit: Payer: Self-pay | Admitting: Family Medicine

## 2023-07-13 ENCOUNTER — Telehealth: Payer: Self-pay | Admitting: Family Medicine

## 2023-07-13 MED ORDER — HYDROCODONE-ACETAMINOPHEN 5-325 MG PO TABS: 0.5000 | ORAL_TABLET | Freq: Four times a day (QID) | ORAL | 0 refills | Status: DC | PRN

## 2023-07-13 NOTE — Telephone Encounter (Signed)
 Patient fell and broke his back. He has a HFU appointment on 6/23 which is the first available. He was only given enough pain meds for 5 days and is requesting a courtesy refill; he's in a lot of pain and is only taking 1/2 of the prescribed dose to stretch it until he can get some more.   Med requested:  HYDROcodone -acetaminophen  (NORCO/VICODIN) 5-325 MG tablet [[id number]]   Pharmacy confirmed as:  [facility] [address], [place] - [address] [place], [place] [address] Phone: [phone]  Fax: [phone] DEA #: [id number]   Please advise son Khiry at [phone].

## 2023-07-17 NOTE — Telephone Encounter (Signed)
Pt has been notified by phone.

## 2023-07-23 ENCOUNTER — Inpatient Hospital Stay: Admitting: Family Medicine

## 2023-07-27 ENCOUNTER — Encounter: Payer: Self-pay | Admitting: Emergency Medicine

## 2023-07-27 ENCOUNTER — Emergency Department

## 2023-07-27 ENCOUNTER — Inpatient Hospital Stay

## 2023-07-27 ENCOUNTER — Other Ambulatory Visit: Payer: Self-pay

## 2023-07-27 ENCOUNTER — Inpatient Hospital Stay
Admission: EM | Admit: 2023-07-27 | Discharge: 2023-07-31 | DRG: 871 | Disposition: E | Attending: Student in an Organized Health Care Education/Training Program | Admitting: Student in an Organized Health Care Education/Training Program

## 2023-07-27 DIAGNOSIS — A419 Sepsis, unspecified organism: Principal | ICD-10-CM

## 2023-07-27 DIAGNOSIS — Z781 Physical restraint status: Secondary | ICD-10-CM | POA: Diagnosis not present

## 2023-07-27 DIAGNOSIS — J9602 Acute respiratory failure with hypercapnia: Secondary | ICD-10-CM | POA: Diagnosis not present

## 2023-07-27 DIAGNOSIS — J9601 Acute respiratory failure with hypoxia: Principal | ICD-10-CM

## 2023-07-27 DIAGNOSIS — Z8679 Personal history of other diseases of the circulatory system: Secondary | ICD-10-CM

## 2023-07-27 DIAGNOSIS — T50915A Adverse effect of multiple unspecified drugs, medicaments and biological substances, initial encounter: Secondary | ICD-10-CM | POA: Diagnosis present

## 2023-07-27 DIAGNOSIS — Z66 Do not resuscitate: Secondary | ICD-10-CM | POA: Diagnosis not present

## 2023-07-27 DIAGNOSIS — Z1152 Encounter for screening for COVID-19: Secondary | ICD-10-CM

## 2023-07-27 DIAGNOSIS — D709 Neutropenia, unspecified: Secondary | ICD-10-CM | POA: Diagnosis not present

## 2023-07-27 DIAGNOSIS — R5081 Fever presenting with conditions classified elsewhere: Secondary | ICD-10-CM | POA: Diagnosis present

## 2023-07-27 DIAGNOSIS — K5190 Ulcerative colitis, unspecified, without complications: Secondary | ICD-10-CM | POA: Diagnosis present

## 2023-07-27 DIAGNOSIS — Z452 Encounter for adjustment and management of vascular access device: Secondary | ICD-10-CM | POA: Diagnosis not present

## 2023-07-27 DIAGNOSIS — N183 Chronic kidney disease, stage 3 unspecified: Secondary | ICD-10-CM | POA: Diagnosis not present

## 2023-07-27 DIAGNOSIS — E872 Acidosis, unspecified: Secondary | ICD-10-CM | POA: Diagnosis present

## 2023-07-27 DIAGNOSIS — J189 Pneumonia, unspecified organism: Secondary | ICD-10-CM

## 2023-07-27 DIAGNOSIS — J9859 Other diseases of mediastinum, not elsewhere classified: Secondary | ICD-10-CM | POA: Diagnosis not present

## 2023-07-27 DIAGNOSIS — J168 Pneumonia due to other specified infectious organisms: Secondary | ICD-10-CM | POA: Diagnosis not present

## 2023-07-27 DIAGNOSIS — G928 Other toxic encephalopathy: Secondary | ICD-10-CM | POA: Diagnosis not present

## 2023-07-27 DIAGNOSIS — J181 Lobar pneumonia, unspecified organism: Secondary | ICD-10-CM | POA: Diagnosis not present

## 2023-07-27 DIAGNOSIS — Z515 Encounter for palliative care: Secondary | ICD-10-CM | POA: Diagnosis not present

## 2023-07-27 DIAGNOSIS — I6782 Cerebral ischemia: Secondary | ICD-10-CM | POA: Diagnosis not present

## 2023-07-27 DIAGNOSIS — N179 Acute kidney failure, unspecified: Secondary | ICD-10-CM | POA: Diagnosis not present

## 2023-07-27 DIAGNOSIS — I716 Thoracoabdominal aortic aneurysm, without rupture, unspecified: Secondary | ICD-10-CM | POA: Diagnosis not present

## 2023-07-27 DIAGNOSIS — R0603 Acute respiratory distress: Secondary | ICD-10-CM | POA: Diagnosis present

## 2023-07-27 DIAGNOSIS — R6521 Severe sepsis with septic shock: Secondary | ICD-10-CM | POA: Diagnosis not present

## 2023-07-27 DIAGNOSIS — I723 Aneurysm of iliac artery: Secondary | ICD-10-CM | POA: Diagnosis not present

## 2023-07-27 DIAGNOSIS — R4189 Other symptoms and signs involving cognitive functions and awareness: Secondary | ICD-10-CM | POA: Diagnosis present

## 2023-07-27 DIAGNOSIS — I6381 Other cerebral infarction due to occlusion or stenosis of small artery: Secondary | ICD-10-CM | POA: Diagnosis not present

## 2023-07-27 DIAGNOSIS — R918 Other nonspecific abnormal finding of lung field: Secondary | ICD-10-CM | POA: Diagnosis not present

## 2023-07-27 DIAGNOSIS — Z8673 Personal history of transient ischemic attack (TIA), and cerebral infarction without residual deficits: Secondary | ICD-10-CM | POA: Diagnosis not present

## 2023-07-27 DIAGNOSIS — R0602 Shortness of breath: Secondary | ICD-10-CM | POA: Diagnosis not present

## 2023-07-27 DIAGNOSIS — R42 Dizziness and giddiness: Secondary | ICD-10-CM | POA: Diagnosis not present

## 2023-07-27 DIAGNOSIS — R590 Localized enlarged lymph nodes: Secondary | ICD-10-CM | POA: Diagnosis not present

## 2023-07-27 DIAGNOSIS — M799 Soft tissue disorder, unspecified: Secondary | ICD-10-CM | POA: Diagnosis not present

## 2023-07-27 DIAGNOSIS — Z4682 Encounter for fitting and adjustment of non-vascular catheter: Secondary | ICD-10-CM | POA: Diagnosis not present

## 2023-07-27 DIAGNOSIS — E8721 Acute metabolic acidosis: Secondary | ICD-10-CM | POA: Diagnosis not present

## 2023-07-27 DIAGNOSIS — R296 Repeated falls: Secondary | ICD-10-CM | POA: Diagnosis not present

## 2023-07-27 LAB — BLOOD CULTURE ID PANEL (REFLEXED) - BCID2
A.calcoaceticus-baumannii: NOT DETECTED
Bacteroides fragilis: NOT DETECTED
CTX-M ESBL: NOT DETECTED
Candida albicans: NOT DETECTED
Candida auris: NOT DETECTED
Candida glabrata: NOT DETECTED
Candida krusei: NOT DETECTED
Candida parapsilosis: NOT DETECTED
Candida tropicalis: NOT DETECTED
Carbapenem resist OXA 48 LIKE: NOT DETECTED
Carbapenem resistance IMP: NOT DETECTED
Carbapenem resistance KPC: NOT DETECTED
Carbapenem resistance NDM: NOT DETECTED
Carbapenem resistance VIM: NOT DETECTED
Cryptococcus neoformans/gattii: NOT DETECTED
Enterobacter cloacae complex: NOT DETECTED
Enterobacterales: DETECTED — AB
Enterococcus Faecium: NOT DETECTED
Enterococcus faecalis: NOT DETECTED
Escherichia coli: NOT DETECTED
Haemophilus influenzae: NOT DETECTED
Klebsiella aerogenes: NOT DETECTED
Klebsiella oxytoca: NOT DETECTED
Klebsiella pneumoniae: DETECTED — AB
Listeria monocytogenes: NOT DETECTED
Neisseria meningitidis: NOT DETECTED
Proteus species: NOT DETECTED
Pseudomonas aeruginosa: NOT DETECTED
Salmonella species: NOT DETECTED
Serratia marcescens: DETECTED — AB
Staphylococcus aureus (BCID): NOT DETECTED
Staphylococcus epidermidis: NOT DETECTED
Staphylococcus lugdunensis: NOT DETECTED
Staphylococcus species: NOT DETECTED
Stenotrophomonas maltophilia: NOT DETECTED
Streptococcus agalactiae: NOT DETECTED
Streptococcus pneumoniae: NOT DETECTED
Streptococcus pyogenes: NOT DETECTED
Streptococcus species: NOT DETECTED

## 2023-07-27 LAB — COMPREHENSIVE METABOLIC PANEL WITH GFR
ALT: 17 U/L (ref 0–44)
ALT: 15 U/L (ref 0–44)
AST: 45 U/L — ABNORMAL HIGH (ref 15–41)
AST: 45 U/L — ABNORMAL HIGH (ref 15–41)
Albumin: 2.8 g/dL — ABNORMAL LOW (ref 3.5–5.0)
Albumin: 2.4 g/dL — ABNORMAL LOW (ref 3.5–5.0)
Alkaline Phosphatase: 152 U/L — ABNORMAL HIGH (ref 38–126)
Alkaline Phosphatase: 126 U/L (ref 38–126)
Anion gap: 19 — ABNORMAL HIGH (ref 5–15)
Anion gap: 21 — ABNORMAL HIGH (ref 5–15)
BUN: 129 mg/dL — ABNORMAL HIGH (ref 8–23)
BUN: 117 mg/dL — ABNORMAL HIGH (ref 8–23)
CO2: 20 mmol/L — ABNORMAL LOW (ref 22–32)
CO2: 16 mmol/L — ABNORMAL LOW (ref 22–32)
Calcium: 8.2 mg/dL — ABNORMAL LOW (ref 8.9–10.3)
Calcium: 7.6 mg/dL — ABNORMAL LOW (ref 8.9–10.3)
Chloride: 101 mmol/L (ref 98–111)
Chloride: 103 mmol/L (ref 98–111)
Creatinine, Ser: 5.5 mg/dL — ABNORMAL HIGH (ref 0.61–1.24)
Creatinine, Ser: 5.27 mg/dL — ABNORMAL HIGH (ref 0.61–1.24)
GFR, Estimated: 10 mL/min — ABNORMAL LOW (ref >60)
GFR, Estimated: 10 mL/min — ABNORMAL LOW (ref >60)
Glucose, Bld: 60 mg/dL — ABNORMAL LOW (ref 70–99)
Glucose, Bld: 102 mg/dL — ABNORMAL HIGH (ref 70–99)
Potassium: 4.8 mmol/L (ref 3.5–5.1)
Potassium: 4 mmol/L (ref 3.5–5.1)
Sodium: 140 mmol/L (ref 135–145)
Sodium: 140 mmol/L (ref 135–145)
Total Bilirubin: 1.3 mg/dL — ABNORMAL HIGH (ref 0.0–1.2)
Total Bilirubin: 0.9 mg/dL (ref 0.0–1.2)
Total Protein: 6.6 g/dL (ref 6.5–8.1)
Total Protein: 6.1 g/dL — ABNORMAL LOW (ref 6.5–8.1)

## 2023-07-27 LAB — BLOOD GAS, ARTERIAL
Acid-base deficit: 10.4 mmol/L — ABNORMAL HIGH (ref 0.0–2.0)
Acid-base deficit: 13.6 mmol/L — ABNORMAL HIGH (ref 0.0–2.0)
Acid-base deficit: 11.9 mmol/L — ABNORMAL HIGH (ref 0.0–2.0)
Bicarbonate: 18.7 mmol/L — ABNORMAL LOW (ref 20.0–28.0)
Bicarbonate: 17.2 mmol/L — ABNORMAL LOW (ref 20.0–28.0)
Bicarbonate: 16.4 mmol/L — ABNORMAL LOW (ref 20.0–28.0)
FIO2: 100 %
FIO2: 100 %
FIO2: 70 %
MECHVT: 500 mL
MECHVT: 500 mL
MECHVT: 500 mL
Mechanical Rate: 16
Mechanical Rate: 20
Mechanical Rate: 28
O2 Saturation: 99.8 %
O2 Saturation: 95.8 %
O2 Saturation: 91.8 %
PEEP: 5 cmH2O
PEEP: 10 cmH2O
PEEP: 10 cmH2O
Patient temperature: 37
Patient temperature: 37
Patient temperature: 37
RATE: 16 {breaths}/min
pCO2 arterial: 55 mmHg — ABNORMAL HIGH (ref 32–48)
pCO2 arterial: 58 mmHg — ABNORMAL HIGH (ref 32–48)
pCO2 arterial: 45 mmHg (ref 32–48)
pH, Arterial: 7.14 — CL (ref 7.35–7.45)
pH, Arterial: 7.08 — CL (ref 7.35–7.45)
pH, Arterial: 7.17 — CL (ref 7.35–7.45)
pO2, Arterial: 106 mmHg (ref 83–108)
pO2, Arterial: 82 mmHg — ABNORMAL LOW (ref 83–108)
pO2, Arterial: 66 mmHg — ABNORMAL LOW (ref 83–108)

## 2023-07-27 LAB — URINALYSIS, W/ REFLEX TO CULTURE (INFECTION SUSPECTED)
Bilirubin Urine: NEGATIVE
Glucose, UA: NEGATIVE mg/dL
Ketones, ur: NEGATIVE mg/dL
Nitrite: NEGATIVE
Protein, ur: 100 mg/dL — AB
Specific Gravity, Urine: 1.02 (ref 1.005–1.030)
WBC, UA: >50 WBC/hpf (ref 0–5)
pH: 5 (ref 5.0–8.0)

## 2023-07-27 LAB — BLOOD GAS, VENOUS
Acid-base deficit: 6.2 mmol/L — ABNORMAL HIGH (ref 0.0–2.0)
Bicarbonate: 21.8 mmol/L (ref 20.0–28.0)
O2 Saturation: 67.1 %
Patient temperature: 37
pCO2, Ven: 52 mmHg (ref 44–60)
pH, Ven: 7.23 — ABNORMAL LOW (ref 7.25–7.43)
pO2, Ven: 43 mmHg (ref 32–45)

## 2023-07-27 LAB — CBC WITH DIFFERENTIAL/PLATELET
Abs Immature Granulocytes: 0 10*3/uL (ref 0.00–0.07)
Abs Immature Granulocytes: 0 10*3/uL (ref 0.00–0.07)
Basophils Absolute: 0 10*3/uL (ref 0.0–0.1)
Basophils Absolute: 0 10*3/uL (ref 0.0–0.1)
Basophils Relative: 1 %
Basophils Relative: 3 %
Eosinophils Absolute: 0 10*3/uL (ref 0.0–0.5)
Eosinophils Absolute: 0 10*3/uL (ref 0.0–0.5)
Eosinophils Relative: 0 %
Eosinophils Relative: 0 %
HCT: 42.9 % (ref 39.0–52.0)
HCT: 40.1 % (ref 39.0–52.0)
Hemoglobin: 14.2 g/dL (ref 13.0–17.0)
Hemoglobin: 12.9 g/dL — ABNORMAL LOW (ref 13.0–17.0)
Immature Granulocytes: 0 %
Immature Granulocytes: 0 %
Lymphocytes Relative: 25 %
Lymphocytes Relative: 19 %
Lymphs Abs: 0.2 10*3/uL — ABNORMAL LOW (ref 0.7–4.0)
Lymphs Abs: 0.1 10*3/uL — ABNORMAL LOW (ref 0.7–4.0)
MCH: 31.8 pg (ref 26.0–34.0)
MCH: 32.7 pg (ref 26.0–34.0)
MCHC: 33.1 g/dL (ref 30.0–36.0)
MCHC: 32.2 g/dL (ref 30.0–36.0)
MCV: 96 fL (ref 80.0–100.0)
MCV: 101.5 fL — ABNORMAL HIGH (ref 80.0–100.0)
Monocytes Absolute: 0 10*3/uL — ABNORMAL LOW (ref 0.1–1.0)
Monocytes Absolute: 0 10*3/uL — ABNORMAL LOW (ref 0.1–1.0)
Monocytes Relative: 4 %
Monocytes Relative: 4 %
Neutro Abs: 0.6 10*3/uL — ABNORMAL LOW (ref 1.7–7.7)
Neutro Abs: 0.5 10*3/uL — ABNORMAL LOW (ref 1.7–7.7)
Neutrophils Relative %: 70 %
Neutrophils Relative %: 74 %
Platelets: 153 10*3/uL (ref 150–400)
Platelets: 106 10*3/uL — ABNORMAL LOW (ref 150–400)
RBC: 4.47 MIL/uL (ref 4.22–5.81)
RBC: 3.95 MIL/uL — ABNORMAL LOW (ref 4.22–5.81)
RDW: 14.7 % (ref 11.5–15.5)
RDW: 15 % (ref 11.5–15.5)
Smear Review: NORMAL
WBC: 0.8 10*3/uL — CL (ref 4.0–10.5)
WBC: 0.7 10*3/uL — CL (ref 4.0–10.5)
nRBC: 0 % (ref 0.0–0.2)
nRBC: 0 % (ref 0.0–0.2)

## 2023-07-27 LAB — RESP PANEL BY RT-PCR (RSV, FLU A&B, COVID)  RVPGX2
Influenza A by PCR: NEGATIVE
Influenza B by PCR: NEGATIVE
Resp Syncytial Virus by PCR: NEGATIVE
SARS Coronavirus 2 by RT PCR: NEGATIVE

## 2023-07-27 LAB — PATHOLOGIST SMEAR REVIEW: Path Review: DECREASED

## 2023-07-27 LAB — PROTIME-INR
INR: 1.4 — ABNORMAL HIGH (ref 0.8–1.2)
INR: 1.5 — ABNORMAL HIGH (ref 0.8–1.2)
Prothrombin Time: 18.1 s — ABNORMAL HIGH (ref 11.4–15.2)
Prothrombin Time: 18.4 s — ABNORMAL HIGH (ref 11.4–15.2)

## 2023-07-27 LAB — LACTIC ACID, PLASMA
Lactic Acid, Venous: 5.2 mmol/L (ref 0.5–1.9)
Lactic Acid, Venous: 6.2 mmol/L (ref 0.5–1.9)
Lactic Acid, Venous: 6.9 mmol/L (ref 0.5–1.9)
Lactic Acid, Venous: 7.1 mmol/L (ref 0.5–1.9)

## 2023-07-27 LAB — CBG MONITORING, ED
Glucose-Capillary: 41 mg/dL — CL (ref 70–99)
Glucose-Capillary: 60 mg/dL — ABNORMAL LOW (ref 70–99)
Glucose-Capillary: 108 mg/dL — ABNORMAL HIGH (ref 70–99)
Glucose-Capillary: 188 mg/dL — ABNORMAL HIGH (ref 70–99)

## 2023-07-27 LAB — APTT: aPTT: 40 s — ABNORMAL HIGH (ref 24–36)

## 2023-07-27 LAB — CK: Total CK: 129 U/L (ref 49–397)

## 2023-07-27 LAB — GLUCOSE, CAPILLARY
Glucose-Capillary: 68 mg/dL — ABNORMAL LOW (ref 70–99)
Glucose-Capillary: 108 mg/dL — ABNORMAL HIGH (ref 70–99)
Glucose-Capillary: 75 mg/dL (ref 70–99)

## 2023-07-27 LAB — BRAIN NATRIURETIC PEPTIDE: B Natriuretic Peptide: 591.5 pg/mL — ABNORMAL HIGH (ref 0.0–100.0)

## 2023-07-27 LAB — TROPONIN I (HIGH SENSITIVITY)
Troponin I (High Sensitivity): 73 ng/L — ABNORMAL HIGH (ref <18)
Troponin I (High Sensitivity): 82 ng/L — ABNORMAL HIGH (ref <18)
Troponin I (High Sensitivity): 94 ng/L — ABNORMAL HIGH (ref <18)

## 2023-07-27 LAB — MRSA NEXT GEN BY PCR, NASAL: MRSA by PCR Next Gen: NOT DETECTED

## 2023-07-27 MED ORDER — DOCUSATE SODIUM 100 MG PO CAPS
100.0000 mg | ORAL_CAPSULE | Freq: Two times a day (BID) | ORAL | Status: DC | PRN
Start: 2023-07-27 — End: 2023-07-28

## 2023-07-27 MED ORDER — ETOMIDATE 2 MG/ML IV SOLN
INTRAVENOUS | Status: AC | PRN
  Administered 2023-07-27: 20 mg via INTRAVENOUS

## 2023-07-27 MED ORDER — VANCOMYCIN HCL 1500 MG/300ML IV SOLN
1500.0000 mg | Freq: Once | INTRAVENOUS | Status: AC
  Administered 2023-07-27: 1500 mg via INTRAVENOUS
  Filled 2023-07-27: qty 300

## 2023-07-27 MED ORDER — MIDAZOLAM HCL 2 MG/2ML IJ SOLN: 2.0000 mg | Freq: Once | INTRAMUSCULAR | Status: AC

## 2023-07-27 MED ORDER — ORAL CARE MOUTH RINSE
15.0000 mL | OROMUCOSAL | Status: DC
  Administered 2023-07-27 – 2023-07-28 (×4): 15 mL via OROMUCOSAL

## 2023-07-27 MED ORDER — DEXTROSE 50 % IV SOLN
INTRAVENOUS | Status: AC
  Filled 2023-07-27: qty 50

## 2023-07-27 MED ORDER — ROCURONIUM BROMIDE 10 MG/ML (PF) SYRINGE: 100.0000 mg | PREFILLED_SYRINGE | Freq: Once | INTRAVENOUS | Status: DC

## 2023-07-27 MED ORDER — ROCURONIUM BROMIDE 10 MG/ML (PF) SYRINGE
PREFILLED_SYRINGE | INTRAVENOUS | Status: AC | PRN
  Administered 2023-07-27: 100 mg via INTRAVENOUS

## 2023-07-27 MED ORDER — SODIUM CHLORIDE 0.9 % IV BOLUS
1000.0000 mL | Freq: Once | INTRAVENOUS | Status: AC
  Administered 2023-07-27: 1000 mL via INTRAVENOUS

## 2023-07-27 MED ORDER — SODIUM CHLORIDE 0.9 % IV SOLN
500.0000 mg | Freq: Once | INTRAVENOUS | Status: AC
  Administered 2023-07-27: 500 mg via INTRAVENOUS
  Filled 2023-07-27: qty 5

## 2023-07-27 MED ORDER — PROPOFOL 1000 MG/100ML IV EMUL: 0.0000 ug/kg/min | INTRAVENOUS | Status: DC

## 2023-07-27 MED ORDER — DEXTROSE 50 % IV SOLN
1.0000 | Freq: Once | INTRAVENOUS | Status: AC
  Administered 2023-07-27: 50 mL via INTRAVENOUS

## 2023-07-27 MED ORDER — SODIUM BICARBONATE 8.4 % IV SOLN
INTRAVENOUS | Status: DC
  Filled 2023-07-27: qty 150
  Filled 2023-07-27: qty 1000

## 2023-07-27 MED ORDER — IPRATROPIUM-ALBUTEROL 0.5-2.5 (3) MG/3ML IN SOLN
9.0000 mL | Freq: Once | RESPIRATORY_TRACT | Status: AC
  Administered 2023-07-27: 9 mL via RESPIRATORY_TRACT
  Filled 2023-07-27: qty 12

## 2023-07-27 MED ORDER — CHLORHEXIDINE GLUCONATE CLOTH 2 % EX PADS: 6.0000 | MEDICATED_PAD | Freq: Every day | CUTANEOUS | Status: DC

## 2023-07-27 MED ORDER — SUCCINYLCHOLINE CHLORIDE 200 MG/10ML IV SOSY
PREFILLED_SYRINGE | INTRAVENOUS | Status: DC
Start: 2023-07-27 — End: 2023-07-27
  Filled 2023-07-27: qty 10

## 2023-07-27 MED ORDER — HYDROCORTISONE SOD SUC (PF) 100 MG IJ SOLR
50.0000 mg | Freq: Four times a day (QID) | INTRAMUSCULAR | Status: DC
  Administered 2023-07-27 – 2023-07-28 (×2): 50 mg via INTRAVENOUS
  Filled 2023-07-27 (×2): qty 2

## 2023-07-27 MED ORDER — MIDAZOLAM HCL 2 MG/2ML IJ SOLN
INTRAMUSCULAR | Status: AC
  Administered 2023-07-27: 2 mg via INTRAVENOUS
  Filled 2023-07-27: qty 2

## 2023-07-27 MED ORDER — FENTANYL CITRATE PF 50 MCG/ML IJ SOSY: 50.0000 ug | PREFILLED_SYRINGE | Freq: Once | INTRAMUSCULAR | Status: AC

## 2023-07-27 MED ORDER — FENTANYL CITRATE PF 50 MCG/ML IJ SOSY: 25.0000 ug | PREFILLED_SYRINGE | INTRAMUSCULAR | Status: DC | PRN

## 2023-07-27 MED ORDER — DEXTROSE 50 % IV SOLN
12.5000 g | INTRAVENOUS | Status: AC
  Administered 2023-07-27: 12.5 g via INTRAVENOUS

## 2023-07-27 MED ORDER — HEPARIN SODIUM (PORCINE) 5000 UNIT/ML IJ SOLN
5000.0000 [IU] | Freq: Three times a day (TID) | INTRAMUSCULAR | Status: DC
Start: 2023-07-27 — End: 2023-07-28
  Administered 2023-07-27 – 2023-07-28 (×2): 5000 [IU] via SUBCUTANEOUS
  Filled 2023-07-27 (×2): qty 1

## 2023-07-27 MED ORDER — LACTATED RINGERS IV SOLN: INTRAVENOUS | Status: DC

## 2023-07-27 MED ORDER — SODIUM CHLORIDE 0.9 % IV SOLN
2.0000 g | Freq: Once | INTRAVENOUS | Status: AC
  Administered 2023-07-27: 2 g via INTRAVENOUS
  Filled 2023-07-27: qty 12.5

## 2023-07-27 MED ORDER — POLYETHYLENE GLYCOL 3350 17 G PO PACK: 17.0000 g | PACK | Freq: Every day | ORAL | Status: DC | PRN

## 2023-07-27 MED ORDER — INSULIN ASPART 100 UNIT/ML IJ SOLN
1.0000 [IU] | INTRAMUSCULAR | Status: DC
  Administered 2023-07-28: 1 [IU] via SUBCUTANEOUS
  Filled 2023-07-27: qty 1

## 2023-07-27 MED ORDER — VASOPRESSIN 20 UNITS/100 ML INFUSION FOR SHOCK
0.0000 [IU]/min | INTRAVENOUS | Status: DC
  Administered 2023-07-28: 0.03 [IU]/min via INTRAVENOUS
  Filled 2023-07-27: qty 100

## 2023-07-27 MED ORDER — IOHEXOL 350 MG/ML SOLN
80.0000 mL | Freq: Once | INTRAVENOUS | Status: AC | PRN
  Administered 2023-07-27: 75 mL via INTRAVENOUS

## 2023-07-27 MED ORDER — ETOMIDATE 2 MG/ML IV SOLN: 20.0000 mg | Freq: Once | INTRAVENOUS | Status: DC

## 2023-07-27 MED ORDER — ORAL CARE MOUTH RINSE: 15.0000 mL | OROMUCOSAL | Status: DC | PRN

## 2023-07-27 MED ORDER — SODIUM CHLORIDE 0.9 % IV SOLN
2.0000 g | Freq: Once | INTRAVENOUS | Status: AC
  Administered 2023-07-27: 2 g via INTRAVENOUS
  Filled 2023-07-27: qty 20

## 2023-07-27 MED ORDER — VANCOMYCIN VARIABLE DOSE PER UNSTABLE RENAL FUNCTION (PHARMACIST DOSING): Status: DC

## 2023-07-27 MED ORDER — SODIUM CHLORIDE 0.9 % IV SOLN: 2.0000 g | INTRAVENOUS | Status: DC

## 2023-07-27 MED ORDER — KETAMINE HCL 50 MG/5ML IJ SOSY
PREFILLED_SYRINGE | INTRAMUSCULAR | Status: AC
  Filled 2023-07-27: qty 10

## 2023-07-27 MED ORDER — POLYETHYLENE GLYCOL 3350 17 G PO PACK: 17.0000 g | PACK | Freq: Every day | ORAL | Status: DC

## 2023-07-27 MED ORDER — HALOPERIDOL LACTATE 5 MG/ML IJ SOLN
2.0000 mg | Freq: Once | INTRAMUSCULAR | Status: AC
  Administered 2023-07-27: 2 mg via INTRAVENOUS
  Filled 2023-07-27: qty 1

## 2023-07-27 MED ORDER — ETOMIDATE 2 MG/ML IV SOLN
INTRAVENOUS | Status: AC
  Filled 2023-07-27: qty 20

## 2023-07-27 MED ORDER — PROPOFOL 1000 MG/100ML IV EMUL
0.0000 ug/kg/min | INTRAVENOUS | Status: DC
  Administered 2023-07-27: 5 ug/kg/min via INTRAVENOUS
  Filled 2023-07-27: qty 100

## 2023-07-27 MED ORDER — DOCUSATE SODIUM 50 MG/5ML PO LIQD: 100.0000 mg | Freq: Two times a day (BID) | ORAL | Status: DC

## 2023-07-27 MED ORDER — LACTATED RINGERS IV BOLUS
1000.0000 mL | Freq: Once | INTRAVENOUS | Status: AC
  Administered 2023-07-27: 1000 mL via INTRAVENOUS

## 2023-07-27 MED ORDER — PANTOPRAZOLE SODIUM 40 MG IV SOLR
40.0000 mg | Freq: Every day | INTRAVENOUS | Status: DC
Start: 2023-07-27 — End: 2023-07-28
  Administered 2023-07-28: 40 mg via INTRAVENOUS
  Filled 2023-07-27: qty 10

## 2023-07-27 MED ORDER — NOREPINEPHRINE 4 MG/250ML-% IV SOLN
0.0000 ug/min | INTRAVENOUS | Status: DC
  Administered 2023-07-27: 30 ug/min via INTRAVENOUS
  Administered 2023-07-27: 13 ug/min via INTRAVENOUS
  Administered 2023-07-27: 15 ug/min via INTRAVENOUS
  Administered 2023-07-27: 10 ug/min via INTRAVENOUS
  Filled 2023-07-27 (×4): qty 250

## 2023-07-27 MED ORDER — ROCURONIUM BROMIDE 10 MG/ML (PF) SYRINGE
PREFILLED_SYRINGE | INTRAVENOUS | Status: AC
  Filled 2023-07-27: qty 10

## 2023-07-27 MED ORDER — PROPOFOL 10 MG/ML IV BOLUS: 0.5000 mg/kg | Freq: Once | INTRAVENOUS | Status: DC

## 2023-07-27 MED ORDER — FENTANYL CITRATE PF 50 MCG/ML IJ SOSY
PREFILLED_SYRINGE | INTRAMUSCULAR | Status: AC
  Administered 2023-07-27: 50 ug via INTRAVENOUS
  Filled 2023-07-27: qty 2

## 2023-07-27 MED ORDER — SODIUM CHLORIDE 0.9 % IV SOLN: 500.0000 mg | INTRAVENOUS | Status: DC

## 2023-07-27 NOTE — Plan of Care (Signed)
  Problem: Clinical Measurements: Goal: Cardiovascular complication will be avoided Outcome: Progressing   Problem: Clinical Measurements: Goal: Will remain free from infection Outcome: Not Progressing Goal: Diagnostic test results will improve Outcome: Not Progressing Goal: Respiratory complications will improve Outcome: Not Progressing   Problem: Activity: Goal: Risk for activity intolerance will decrease Outcome: Not Progressing   Problem: Nutrition: Goal: Adequate nutrition will be maintained Outcome: Not Progressing   Problem: Education: Goal: Knowledge of General Education information will improve Description: Including pain rating scale, medication(s)/side effects and non-pharmacologic comfort measures Outcome: Not Applicable   Problem: Health Behavior/Discharge Planning: Goal: Ability to manage health-related needs will improve Outcome: Not Applicable   Problem: Clinical Measurements: Goal: Ability to maintain clinical measurements within normal limits will improve Outcome: Not Applicable

## 2023-07-27 NOTE — ED Notes (Signed)
 Family placed in family wait

## 2023-07-27 NOTE — ED Notes (Addendum)
 2 blue top cultures collected due to pt being very difficult stick and not enough blood to get blue and red.

## 2023-07-27 NOTE — ED Triage Notes (Signed)
 Pt to ED via [facility] from home for shortness of breath that started last night. Pts got acutely worse this morning and family called EMS. EMS reports that when fire arrived pt had agonal respirations and his initial O2 sats were in the 50's. Pt's O2 sats came up to the 80's with a non-rebreather. Upon EMS arrival pt was placed on CPAP but was unable to tolerate so EMS began bagging patient. Pt's sats improved. EMS reports pt had silent chest initially. Pt was given a duoneb treatment and Solumedrol 125 mg IM due to no IV access. Pt was placed back on CPAP but this caused a significant drop in his blood pressure so he was placed back on a non-rebreather.   EMS reports CBG of 190's however, in the ED pts CBG is reading 41. Pt has labored, shallowed respirations with sats in the upper 80's. Pt is tachycardic.

## 2023-07-27 NOTE — Progress Notes (Addendum)
 Patient transferred to unit by ED staff. CN Katie and RN Carly at bedside to assist this RN on admission along with RT. Patient currently requiring levophed for BP support. Mottled and cyanotic. Difficult pulse ox obtainment, MD Dgayli aware. Vent setting adjusted by MD s/t ABG result of critical pH 7.17 which was improvement from previous pH of 7.14  No OG in place. Per report from ED: first attempt in bronchus->removed, 2nd attempt met resistance and unable to advance. MD aware.   Son at bedside visiting. Updated within nursing scope. Son's contact information added to chart. Patient's wife is not competent to understand/decision make d/t advanced dementia. Son encouraged to call with any questions or concerns and educated staff will contact with any acute changes in patient status. Son appreciative of care patient has received.

## 2023-07-27 NOTE — Progress Notes (Signed)
 RT and NP at bedside. O2 sats down to 77-79% on 100% FIO2. Verbal order for stat chest xray.

## 2023-07-27 NOTE — Consult Note (Signed)
 PHARMACY - BRIEF ANTIBIOTIC NOTE   Pharmacy has received consult(s) for vancomycin and cefepime from an ED provider. The patient's profile has been reviewed for ht/wt/allergies/indication/available labs.    One time order(s) placed for: Cefepime 2g, Vancomycin 1500mg   Further antibiotics/pharmacy consults should be ordered by admitting physician if indicated.                       Thank you,  Will M. Lenon, PharmD Clinical Pharmacist 07/27/2023 12:11 PM

## 2023-07-27 NOTE — ED Notes (Addendum)
 Pts Sats in the 70's on neb treatments. Pt placed on High flow [place] that was set up at bedside. Pts sats came up to 80%. Dr. Suzanne called to bedside. Pt placed on 15 liter NRB and High flow . Pt sats came up to 82%

## 2023-07-27 NOTE — H&P (Signed)
 NAME:  Matthew Luna, MRN:  [id number], DOB:  May 16, 1940, LOS: 0 ADMISSION DATE:  07/27/2023, CONSULTATION DATE:  07/27/2023 REFERRING MD:  Matthew Punter, MD, CHIEF COMPLAINT:  Respiratory Failure   History of Present Illness:   Patient is an 83 year old male who presents to the hospital brought in by EMS with respiratory distress and altered mental status.  Patient was significantly hypoxic on arrival, with saturation of 55%, prompting initiation of nonrebreather by EMS and transferred to the ED.  His son who provided collateral history reported that his father had been reporting chronic dizziness, recurrent falls but was otherwise in his usual state of health.  On checking on him today, he found him to be short of breath and altered. No further history was obtainable.  On arrival to the ED, patient was intubated given respiratory distress.  Blood work showed significant neutropenia.  He was hypotensive postintubation prompting the initiation of norepinephrine.  Broad-spectrum antibiotics were initiated given concern for sepsis and neutropenia. Initial blood gas showed profound acidosis. He is being admitted to the ICU for further management.  Pertinent  Medical History  -recurrent falls -CVA -CKD -AAA -Ulcerative Colitis -Cognitive Impairment  Significant Hospital Events: Including procedures, antibiotic start and stop dates in addition to other pertinent events   6/27: intubated in the ED, admit to the ICU  Interim History / Subjective:  Patient is obtunded and unresponsive  Objective    Blood pressure 110/87, pulse (!) 131, temperature (!) 97.4 F (36.3 C), resp. rate 18, height 6' 3 (1.905 m), weight 72.6 kg, SpO2 98%.    Vent Mode: PRVC FiO2 (%):  [100 %] 100 % Set Rate:  [16 bmp-18 bmp] 18 bmp Vt Set:  [500 mL] 500 mL PEEP:  [5 cmH20-10 cmH20] 10 cmH20   Intake/Output Summary (Last 24 hours) at 07/27/2023 1334 Last data filed at 07/27/2023 1213 Gross per 24 hour   Intake 1000 ml  Output --  Net 1000 ml   Filed Weights   07/27/23 1027  Weight: 72.6 kg    Examination:  Physical Exam Constitutional:      General: He is not in acute distress.    Appearance: He is ill-appearing.   Cardiovascular:     Rate and Rhythm: Regular rhythm. Tachycardia present.     Pulses: Normal pulses.     Heart sounds: Normal heart sounds.  Pulmonary:     Comments: Ventilated breath sounds bilaterally Abdominal:     Palpations: Abdomen is soft.   Neurological:     Mental Status: He is disoriented.     Assessment and Plan   Neurology #Toxic Metabolic Encephalopathy #History of CVA #Cognitive Decline  #Polypharmacy  Report of polypharmacy based on notes from patient's PCP, with attempt to decrease medicines secondary to dizziness and recurrent falls. Currently intubated and sedated with propofol .  -Propofol  to maintain RASS goal of -1 -PRN fentnayl to maintain CPOT <2 -Daily wake up assessment  Cardiovascular #Septic Shock  Septic shock secondary to presumed severe community acquired pneumonia with significant neutropenia. Initiated on vasopressors for BP support, and has lactates that are elevated suggesting poor perfusion.  -IV fluids with sodium bicarbonate -nor-epi for goal MAP > 65 -trend lactates -check TTE  Pulmonary #Acute Hypoxic Respiratory Failure #Multi-focal Pneumonia  #CAP  Respiratory failure requiring intubation, with CT showing multifocal infiltrates and RLL consolidation. He is also severely acidemia with hypercapnia. Will optimize his minute ventilation and check serial ABG's to trend acidosis. Treated for CAP as per ID  section below.  -Full vent support, implement lung protective strategies -Plateau pressures less than 30 cm H20 -Wean FiO2 & PEEP as tolerated to maintain O2 sats >92% -Implement VAP Bundle  Gastrointestinal  No active issues at this moment. PPI for SUP.  -NPO -tube feeds in AM  Renal #AKI on  CKD #Metabolic Acidosis #ED on Trimix (alprostadil, papaverine, and phentolamine)  Presents with significant AKI on CKD (baseline 1.7 to 2) with associated acidosis. Given this, we've initiated sodium bicarbonate infusion per [facility]-ICU, and will consider initiation of CRRT. He did receive IV contrast with his CT so I suspect potential worsening over the next 24 to 48 hours with a component of CIN.  -trend ABG's -avoid nephrotoxins as able -sodium bicarbonate infusion  Endocrine  ICU glycemic protocol  Hem/Onc #Neutropenia  Unclear etiology, potentially marrow suppression secondary to septic shock. Platelet count normal, so we will initiate heparin  subQ for DVT prophylaxis but will closely trend numbers.  -heparin  subQ for DVT prophylaxis -trend WBC, H/H, and Platelet count  ID #Septic Shock #Febrile Neutropenia  Broad spectrum antibiotics for severe pneumonia, with Cefepime and Vancomycin. Will add azithromycin for atypical coverage, and check urine strep/legionella antigens.  -Cefepime/Vanc/Azithromycin (Day 1: 6/27)   Best Practice (right click and Reselect all SmartList Selections daily)   Diet/type: NPO DVT prophylaxis prophylactic heparin   Pressure ulcer(s): N/A GI prophylaxis: PPI Lines: N/A Foley:  Yes, and it is still needed Code Status:  full code Last date of multidisciplinary goals of care discussion [07/27/2023]  Labs   CBC: Recent Labs  Lab 07/27/23 1020  WBC 0.8*  NEUTROABS 0.6*  HGB 14.2  HCT 42.9  MCV 96.0  PLT 153    Basic Metabolic Panel: Recent Labs  Lab 07/27/23 1020  NA 140  K 4.8  CL 101  CO2 20*  GLUCOSE 60*  BUN 129*  CREATININE 5.50*  CALCIUM  8.2*   GFR: Estimated Creatinine Clearance: 10.8 mL/min (A) (by C-G formula based on SCr of 5.5 mg/dL (H)). Recent Labs  Lab 07/27/23 1020 07/27/23 1253  WBC 0.8*  --   LATICACIDVEN 5.2* 6.2*    Liver Function Tests: Recent Labs  Lab 07/27/23 1020  AST 45*  ALT 17   ALKPHOS 152*  BILITOT 1.3*  PROT 6.6  ALBUMIN 2.8*   No results for input(s): LIPASE, AMYLASE in the last 168 hours. No results for input(s): AMMONIA in the last 168 hours.  ABG    Component Value Date/Time   HCO3 21.8 07/27/2023 1042   TCO2 22 05/10/2014 2132   ACIDBASEDEF 6.2 (H) 07/27/2023 1042   O2SAT 67.1 07/27/2023 1042     Coagulation Profile: Recent Labs  Lab 07/27/23 1253  INR 1.4*    Cardiac Enzymes: Recent Labs  Lab 07/27/23 1020  CKTOTAL 129    HbA1C: Hgb A1c MFr Bld  Date/Time Value Ref Range Status  05/11/2014 06:55 AM 6.0 (H) 4.8 - 5.6 % Final    Comment:    (NOTE)         Pre-diabetes: 5.7 - 6.4         Diabetes: >6.4         Glycemic control for adults with diabetes: <7.0     CBG: Recent Labs  Lab 07/27/23 1022 07/27/23 1024 07/27/23 1043 07/27/23 1129  GLUCAP 41* 60* 188* 108*    Review of Systems:   N/A  Past Medical History:  He,  has a past medical history of AAA (abdominal aortic aneurysm) (HCC), Actinic keratosis,  Allergy, Anxiety, CKD (chronic kidney disease) stage 3, GFR 30-59 ml/min (HCC), Diverticulosis of colon (without mention of hemorrhage), Duodenitis without mention of hemorrhage, Esophageal reflux, Hypertrophy of prostate without urinary obstruction and other lower urinary tract symptoms (LUTS), Left sided ulcerative (chronic) colitis (HCC), Mononeuritis of unspecified site, Neuropathy, Nonspecific abnormal results of thyroid  function study, Prostate cancer (HCC), Schatzki's ring, Sleep apnea, Stricture and stenosis of esophagus, Stroke (HCC), TIA (transient ischemic attack) (2017), and Unspecified essential hypertension.   Surgical History:   Past Surgical History:  Procedure Laterality Date   ABDOMINAL AORTIC ANEURYSM REPAIR     2018   BALLOON DILATION  03/22/2011   Procedure: BALLOON DILATION;  Surgeon: Lamar JONETTA Aho, MD;  Location: [facility];  Service: Endoscopy;  Laterality: N/A;  kelly/ebp    CHOLECYSTECTOMY     COLONOSCOPY     ENDOVASCULAR REPAIR/STENT GRAFT N/A 03/29/2016   Procedure: Endovascular Repair/Stent Graft;  Surgeon: Selinda GORMAN Gu, MD;  Location: [facility];  Service: Cardiovascular;  Laterality: N/A;   ESOPHAGOGASTRODUODENOSCOPY  03/22/2011   Procedure: ESOPHAGOGASTRODUODENOSCOPY (EGD);  Surgeon: Lamar JONETTA Aho, MD;  Location: [facility];  Service: Endoscopy;  Laterality: N/A;   PROSTATE SURGERY     UPPER GASTROINTESTINAL ENDOSCOPY       Social History:   reports that he has never smoked. He has never used smokeless tobacco. He reports current alcohol use of about 14.0 standard drinks of alcohol per week. He reports that he does not use drugs.   Family History:  His family history includes Cancer in his mother; Snoring in his father. There is no history of Anesthesia problems, Hypotension, Malignant hyperthermia, Pseudochol deficiency, Colon cancer, Colon polyps, Esophageal cancer, Stomach cancer, or Rectal cancer.   Allergies Allergies  Allergen Reactions   Metronidazole Anaphylaxis   Amoxicillin -Pot Clavulanate Other (See Comments)    UNKNOWN   Ciprofloxacin Other (See Comments)    UNKNOWN   Clindamycin /Lincomycin Other (See Comments)    Nausea, stomach pain, sweats   Mesalamine     REACTION: Intolerance     Home Medications  Prior to Admission medications   Medication Sig Start Date End Date Taking? Authorizing Provider  atorvastatin  (LIPITOR ) 40 MG tablet Take 1 tablet (40 mg total) by mouth daily. 06/04/23   Duanne Butler DASEN, MD  hydrochlorothiazide  (MICROZIDE ) 12.5 MG capsule TAKE 1 CAPSULE BY MOUTH EVERY DAY 12/29/20   Chandra Harlene LABOR, NP  HYDROcodone -acetaminophen  (NORCO/VICODIN) 5-325 MG tablet Take 0.5 tablets by mouth every 6 (six) hours as needed for severe pain (pain score 7-10). 07/13/23   Duanne Butler DASEN, MD  mirabegron  ER (MYRBETRIQ ) 25 MG TB24 tablet Take 1 tablet (25 mg total) by mouth daily. 11/06/22   Kayla Jeoffrey GORMAN, FNP   NONFORMULARY OR COMPOUNDED ITEM Trimix (30/1/10)-(Pap/Phent/PGE)  Test Dose  1ml vial   Qty #3 Refills 0  [facility] [phone] Fax [phone] 04/05/23   Helon Kirsch A, PA-C  tamsulosin  (FLOMAX ) 0.4 MG CAPS capsule Take 1 capsule (0.4 mg total) by mouth daily after supper. 03/08/23   Francisca Redell BROCKS, MD     Critical care time: 11 minutes    Belva November, MD [facility] 07/27/2023 4:35 PM

## 2023-07-27 NOTE — Sepsis Progress Note (Signed)
 Sepsis protocol monitored by eLink ?

## 2023-07-27 NOTE — Progress Notes (Signed)
 Critical lab called on pH of 7.17 MD Dgayli notified face to face by this RN.

## 2023-07-27 NOTE — Progress Notes (Signed)
 PHARMACY CONSULT NOTE - FOLLOW UP  Pharmacy Consult for Electrolyte Monitoring and Replacement   Recent Labs: Potassium (mmol/L)  Date Value  07/27/2023 4.8   Calcium  (mg/dL)  Date Value  93/72/7974 8.2 (L)   Albumin (g/dL)  Date Value  93/72/7974 2.8 (L)   Sodium (mmol/L)  Date Value  07/27/2023 140     Assessment: 83 y.o. male w/ PMH of AAA, anxiety, diverticulosis, LUTS, colitis, neuropathy, prostate cancer, OSA, CVA admitted on 07/27/2023 with febrile neutropenia.  Pharmacy has been consulted for vancomycin and cefepime dosing. Serum creatinine is notably elevated above apparent baseline level on admission. Pharmacy is asked to follow and replace electrolytes while in CCU  Goal of Therapy:  Electrolytes WNL  Plan:  ---no electrolyte replacement warranted for today ---recheck electrolytes in am  [name] ,PharmD Clinical Pharmacist 07/27/2023 1:45 PM

## 2023-07-27 NOTE — Consult Note (Signed)
 CODE SEPSIS - PHARMACY COMMUNICATION  **Broad-spectrum antimicrobials should be administered within one hour of sepsis diagnosis**  Time Code Sepsis call or page was received: 1024  Antibiotics ordered: Ceftriaxone, Azithromycin  Time of first antibiotic administration: 1045  Additional action taken by pharmacy: N/A  If necessary, name of provider/nurse contacted: N/A    Will M. Lenon, PharmD Clinical Pharmacist 07/27/2023 10:29 AM

## 2023-07-27 NOTE — Progress Notes (Signed)
 PHARMACY - PHYSICIAN COMMUNICATION CRITICAL VALUE ALERT - BLOOD CULTURE IDENTIFICATION (BCID)  Results for orders placed or performed during the hospital encounter of 07/27/23  Blood Culture (routine x 2)     Status: None (Preliminary result)   Collection Time: 07/27/23 10:27 AM   Specimen: BLOOD  Result Value Ref Range Status   Specimen Description BLOOD BLOOD RIGHT WRIST  Final   Special Requests   Final    BOTTLES DRAWN AEROBIC ONLY Blood Culture results may not be optimal due to an inadequate volume of blood received in culture bottles   Culture  Setup Time   Final    GRAM NEGATIVE RODS AEROBIC BOTTLE ONLY Organism ID to follow CRITICAL RESULT CALLED TO, READ BACK BY AND VERIFIED WITH: Performed at [facility], [address] [place], [place] [address]    Culture GRAM NEGATIVE RODS  Final   Report Status PENDING  Incomplete  Resp panel by RT-PCR (RSV, Flu A&B, Covid) Anterior Nasal Swab     Status: None   Collection Time: 07/27/23 10:45 AM   Specimen: Anterior Nasal Swab  Result Value Ref Range Status   SARS Coronavirus 2 by RT PCR NEGATIVE NEGATIVE Final    Comment: (NOTE) SARS-CoV-2 target nucleic acids are NOT DETECTED.  The SARS-CoV-2 RNA is generally detectable in upper respiratory specimens during the acute phase of infection. The lowest concentration of SARS-CoV-2 viral copies this assay can detect is 138 copies/mL. A negative result does not preclude SARS-Cov-2 infection and should not be used as the sole basis for treatment or other patient management decisions. A negative result may occur with  improper specimen collection/handling, submission of specimen other than nasopharyngeal swab, presence of viral mutation(s) within the areas targeted by this assay, and inadequate number of viral copies(<138 copies/mL). A negative result must be combined with clinical observations, patient history, and epidemiological information. The expected result is  Negative.  Fact Sheet for Patients:  BloggerCourse.com  Fact Sheet for Healthcare Providers:  SeriousBroker.it  This test is no t yet approved or cleared by the [place]  FDA and  has been authorized for detection and/or diagnosis of SARS-CoV-2 by FDA under an Emergency Use Authorization (EUA). This EUA will remain  in effect (meaning this test can be used) for the duration of the COVID-19 declaration under Section 564(b)(1) of the Act, 21 U.S.C.section 360bbb-3(b)(1), unless the authorization is terminated  or revoked sooner.       Influenza A by PCR NEGATIVE NEGATIVE Final   Influenza B by PCR NEGATIVE NEGATIVE Final    Comment: (NOTE) The Xpert Xpress SARS-CoV-2/FLU/RSV plus assay is intended as an aid in the diagnosis of influenza from Nasopharyngeal swab specimens and should not be used as a sole basis for treatment. Nasal washings and aspirates are unacceptable for Xpert Xpress SARS-CoV-2/FLU/RSV testing.  Fact Sheet for Patients: BloggerCourse.com  Fact Sheet for Healthcare Providers: SeriousBroker.it  This test is not yet approved or cleared by the [place]  FDA and has been authorized for detection and/or diagnosis of SARS-CoV-2 by FDA under an Emergency Use Authorization (EUA). This EUA will remain in effect (meaning this test can be used) for the duration of the COVID-19 declaration under Section 564(b)(1) of the Act, 21 U.S.C. section 360bbb-3(b)(1), unless the authorization is terminated or revoked.     Resp Syncytial Virus by PCR NEGATIVE NEGATIVE Final    Comment: (NOTE) Fact Sheet for Patients: BloggerCourse.com  Fact Sheet for Healthcare Providers: SeriousBroker.it  This test is not yet  approved or cleared by the [place]  FDA and has been authorized for detection and/or diagnosis of  SARS-CoV-2 by FDA under an Emergency Use Authorization (EUA). This EUA will remain in effect (meaning this test can be used) for the duration of the COVID-19 declaration under Section 564(b)(1) of the Act, 21 U.S.C. section 360bbb-3(b)(1), unless the authorization is terminated or revoked.  Performed at [facility], [address] [place], [place] [address]   MRSA Next Gen by PCR, Nasal     Status: None   Collection Time: 07/27/23  2:56 PM   Specimen: Nasal Mucosa; Nasal Swab  Result Value Ref Range Status   MRSA by PCR Next Gen NOT DETECTED NOT DETECTED Final    Comment: (NOTE) The GeneXpert MRSA Assay (FDA approved for NASAL specimens only), is one component of a comprehensive MRSA colonization surveillance program. It is not intended to diagnose MRSA infection nor to guide or monitor treatment for MRSA infections. Test performance is not FDA approved in patients less than 65 years old. Performed at [facility], [address] [place], [place] [address]     BCID Results: ***  Name of provider contacted: ***   Changes to prescribed antibiotics required: ***  Rankin CANDIE Dills, PharmD, [facility] 07/27/2023 11:42 PM

## 2023-07-27 NOTE — Progress Notes (Signed)
 Pharmacy Antibiotic Note  Matthew Luna is a 83 y.o. male w/ PMH of AAA, anxiety, diverticulosis, LUTS, colitis, neuropathy, prostate cancer, OSA, CVA admitted on 07/27/2023 with febrile neutropenia.  Pharmacy has been consulted for vancomycin and cefepime dosing. Serum creatinine is notably elevated above apparent baseline level  Plan:  1) start cefepime 2 grams IV every 24 hours  2) vancomycin 1500 mg IV x 1 ---vancomycin variable dose marker placed ISO AKI ---random vancomycin level ordered for am to assess clearance ---BMP in am to evaluate CrCl   Height: 6' 3 (190.5 cm) Weight: 72.6 kg (160 lb) IBW/kg (Calculated) : 84.5  Temp (24hrs), Avg:96.7 F (35.9 C), Min:96 F (35.6 C), Max:97.4 F (36.3 C)  Recent Labs  Lab 07/27/23 1020 07/27/23 1253  WBC 0.8*  --   CREATININE 5.50*  --   LATICACIDVEN 5.2* 6.2*    Estimated Creatinine Clearance: 10.8 mL/min (A) (by C-G formula based on SCr of 5.5 mg/dL (H)).    Allergies  Allergen Reactions   Metronidazole Anaphylaxis   Amoxicillin -Pot Clavulanate Other (See Comments)    UNKNOWN   Ciprofloxacin Other (See Comments)    UNKNOWN   Clindamycin /Lincomycin Other (See Comments)    Nausea, stomach pain, sweats   Mesalamine     REACTION: Intolerance    Antimicrobials this admission: 06/27 ceftriaxone, azithromycin x 1 06/27 vancomycin >>  06/27 cefepime >>   Microbiology results: 06/27 BCx: pending 06/27 UCx: pending   Thank you for allowing pharmacy to be a part of this patient's care.  Matthew Luna 07/27/2023 1:33 PM

## 2023-07-27 NOTE — ED Provider Notes (Signed)
 [facility] Provider Note    Event Date/Time   First MD Initiated Contact with Patient 07/27/23 1016     (approximate)   History   Respiratory Distress   HPI  Matthew Luna is a 83 y.o. male past medical history significant for chronic dizziness, presents to the emergency department with respiratory distress.  EMS was called out for respiratory distress and patient states that he had been short of breath all night.  When they arrived patient was hypoxic to 55%.  Fire started him on a nonrebreather.  They attempted to put him on CPAP however he had a significant drop of his systolic blood pressure into the 60s.  Patient was placed on a facemask nonrebreather and given a DuoNeb treatment.  Unable to get IV access so was started on IM Solu-Medrol 125 mg.  Patient is full code.  Patient is unable to give any further history at this time.  Family members on scene stated that he did not have a history of cardiac or pulmonary issues.  Patient's son arrived and is at bedside.  States that he has had multiple falls recently and had a questionable rib injury.  Was started on Vicodin and he has been confused and altered since that time.  States that today he had significant worsening respiratory distress.  He is uncertain if he had had another fall.     Physical Exam   Triage Vital Signs: ED Triage Vitals [07/27/23 1015]  Encounter Vitals Group     BP      Girls Systolic BP Percentile      Girls Diastolic BP Percentile      Boys Systolic BP Percentile      Boys Diastolic BP Percentile      Pulse      Resp      Temp      Temp src      SpO2 (!) 85 %     Weight      Height      Head Circumference      [facility] Flow      Pain Score      Pain Loc      Pain Education      Exclude from Growth Chart     Most recent vital signs: Vitals:   07/27/23 1126 07/27/23 1200  BP:  (!) 151/84  Pulse: (!) 127   Resp:  16  SpO2: (!) 82%     Physical Exam Constitutional:       Appearance: He is well-developed. He is ill-appearing.  HENT:     Head: Atraumatic.   Eyes:     Extraocular Movements: Extraocular movements intact.     Conjunctiva/sclera: Conjunctivae normal.     Pupils: Pupils are equal, round, and reactive to light.    Cardiovascular:     Rate and Rhythm: Regular rhythm.  Pulmonary:     Effort: Respiratory distress present.     Breath sounds: Wheezing and rhonchi present.     Comments: Speaking in one-word sentences.  Diffuse wheezing and rhonchi throughout all lung fields. Abdominal:     Tenderness: There is no abdominal tenderness.   Musculoskeletal:     Cervical back: Normal range of motion.     Right lower leg: No edema.     Left lower leg: No edema.   Skin:    General: Skin is warm.     Capillary Refill: Capillary refill takes 2 to 3 seconds.  Neurological:     Mental Status: He is alert. He is disoriented.     IMPRESSION / MDM / ASSESSMENT AND PLAN / ED COURSE  I reviewed the triage vital signs and the nursing notes.  On arrival patient hypoxic at 85%, placed on nonrebreather.  Respiratory is at bedside.  Fingerstick glucose was 190.  Sinus tachycardia with a heart rate of 130s, hypoxic.  Broad differential including sepsis, COVID/influenza, anemia, pulmonary embolism, ACS, COPD, pneumonia   Will obtain blood culture.  Give 1 L of IV fluids.  On chart review no history of heart failure.  Given DuoNeb treatment and will try high flow nasal cannula.  Initially covered with antibiotics for community-acquired pneumonia with Rocephin and azithromycin  Patient continued to be profoundly hypoxic with an oxygen saturation in the high 70s despite high flow nasal cannula and a nonrebreather.  Patient was intubated for respiratory failure.  Hypotension with a MAP of 50.  Started on peripheral Levophed.  Initially felt that 30 cc/kg of IV fluids may be detrimental given his severe hypoxia, given 1 L of IV fluids and  reevaluated.  EKG  I, Clotilda Punter, the attending physician, personally viewed and interpreted this ECG.  EKG showed sinus tachycardia with a heart rate of 128.  QRS prolonged at 125.  QTc 476.  No significant ST elevation or depression.  No findings of acute ischemia or dysrhythmia.  Sinus tachycardia while on cardiac telemetry.  RADIOLOGY I independently reviewed imaging, my interpretation of imaging: Portable chest x-ray -significant abnormality to the right side of the chest.  Discussed with the radiologist who recommended a CTA.    LABS (all labs ordered are listed, but only abnormal results are displayed) Labs interpreted as -    Labs Reviewed  LACTIC ACID, PLASMA - Abnormal; Notable for the following components:      Result Value   Lactic Acid, Venous 5.2 (*)    All other components within normal limits  COMPREHENSIVE METABOLIC PANEL WITH GFR - Abnormal; Notable for the following components:   CO2 20 (*)    Glucose, Bld 60 (*)    BUN 129 (*)    Creatinine, Ser 5.50 (*)    Calcium  8.2 (*)    Albumin 2.8 (*)    AST 45 (*)    Alkaline Phosphatase 152 (*)    Total Bilirubin 1.3 (*)    GFR, Estimated 10 (*)    Anion gap 19 (*)    All other components within normal limits  CBC WITH DIFFERENTIAL/PLATELET - Abnormal; Notable for the following components:   WBC 0.8 (*)    Neutro Abs 0.6 (*)    Lymphs Abs 0.2 (*)    Monocytes Absolute 0.0 (*)    All other components within normal limits  BRAIN NATRIURETIC PEPTIDE - Abnormal; Notable for the following components:   B Natriuretic Peptide 591.5 (*)    All other components within normal limits  BLOOD GAS, VENOUS - Abnormal; Notable for the following components:   pH, Ven 7.23 (*)    Acid-base deficit 6.2 (*)    All other components within normal limits  CBG MONITORING, ED - Abnormal; Notable for the following components:   Glucose-Capillary 41 (*)    All other components within normal limits  CBG MONITORING, ED -  Abnormal; Notable for the following components:   Glucose-Capillary 60 (*)    All other components within normal limits  CBG MONITORING, ED - Abnormal; Notable for the following components:  Glucose-Capillary 188 (*)    All other components within normal limits  CBG MONITORING, ED - Abnormal; Notable for the following components:   Glucose-Capillary 108 (*)    All other components within normal limits  TROPONIN I (HIGH SENSITIVITY) - Abnormal; Notable for the following components:   Troponin I (High Sensitivity) 73 (*)    All other components within normal limits  RESP PANEL BY RT-PCR (RSV, FLU A&B, COVID)  RVPGX2  CULTURE, BLOOD (ROUTINE X 2)  CULTURE, BLOOD (ROUTINE X 2)  LACTIC ACID, PLASMA  URINALYSIS, W/ REFLEX TO CULTURE (INFECTION SUSPECTED)  PROTIME-INR  PATHOLOGIST SMEAR REVIEW  BLOOD GAS, ARTERIAL  TROPONIN I (HIGH SENSITIVITY)     MDM  Patient found to be neutropenic.  Added on vancomycin and cefepime.  On reevaluation given that he had severe hypotensive ordered total 30 cc/kg of IV fluids.  Ordered a total of 3 L.  Clinical Course as of 07/27/23 1303  Fri Jul 27, 2023  1130 Concern for possible dissection or pulmonary embolism.  Significantly abnormal chest x-ray.  Called from radiologist who recommended a CTA of the patient's chest.  Patient was intubated for respiratory failure.  Patient does have significantly elevated creatinine with a GFR of 10.  Given significant concern for possible dissection or aneurysm feel that it detrimental to get a contrasted study to get further information.  Initial lactic acid significantly elevated at 5.2. [SM]  1131 Discussed with his son at bedside the risk of contrast-induced nephropathy and undiagnosed dissection, ultimately decided to proceed with contrast dye knowing that he may need dialysis and have significant kidney injury. [SM]    Clinical Course User Index [SM] Suzanne Kirsch, MD   On my evaluation of CTA no obvious  dissection does have an aneurysm and prior aneurysm repair.  Patient with a significant right lower lobe pneumonia.  Consulted ICU for admission.  PROCEDURES:  Critical Care performed: yes  .Critical Care  Performed by: Suzanne Kirsch, MD Authorized by: Suzanne Kirsch, MD   Critical care provider statement:    Critical care time (minutes):  50   Critical care time was exclusive of:  Separately billable procedures and treating other patients   Critical care was necessary to treat or prevent imminent or life-threatening deterioration of the following conditions:  Respiratory failure   Critical care was time spent personally by me on the following activities:  Development of treatment plan with patient or surrogate, discussions with consultants, evaluation of patient's response to treatment, examination of patient, ordering and review of laboratory studies, ordering and review of radiographic studies, ordering and performing treatments and interventions, pulse oximetry, re-evaluation of patient's condition and review of old charts   Care discussed with: admitting provider   Procedure Name: Intubation Date/Time: 07/27/2023 12:56 PM  Performed by: Suzanne Kirsch, MDPre-anesthesia Checklist: Suction available, Emergency Drugs available, Patient identified, Patient being monitored and Timeout performed Oxygen Delivery Method: Ambu bag Preoxygenation: Pre-oxygenation with 100% oxygen Induction Type: Rapid sequence Ventilation: Mask ventilation without difficulty Laryngoscope Size: Glidescope and 3 Grade View: Grade I Tube size: 7.5 mm Number of attempts: 1 Airway Equipment and Method: Video-laryngoscopy Placement Confirmation: ETT inserted through vocal cords under direct vision, Positive ETCO2, CO2 detector and Breath sounds checked- equal and bilateral Secured at: 23 cm Tube secured with: ETT holder Dental Injury: Teeth and Oropharynx as per pre-operative assessment  Future Recommendations:  Recommend- induction with short-acting agent, and alternative techniques readily available      Patient's presentation is most consistent  with acute presentation with potential threat to life or bodily function.   MEDICATIONS ORDERED IN ED: Medications  lactated ringers infusion (has no administration in time range)  norepinephrine (LEVOPHED) 4mg  in (0.016 mg/mL) premix infusion (15 mcg/min Intravenous Rate/Dose Change 07/27/23 1221)  rocuronium  (ZEMURON ) 100 MG/10ML injection (has no administration in time range)  succinylcholine (ANECTINE) 200 MG/10ML syringe (has no administration in time range)  ketamine HCl 50 MG/5ML SOSY (has no administration in time range)  etomidate (AMIDATE) injection 20 mg (0 mg Intravenous Hold 07/27/23 1224)  rocuronium  (ZEMURON ) injection 100 mg (has no administration in time range)  etomidate (AMIDATE) injection (20 mg Intravenous Given 07/27/23 1122)  rocuronium  (ZEMURON ) injection (100 mg Intravenous Given 07/27/23 1123)  ceFEPIme (MAXIPIME) 2 g in sodium chloride  0.9 % 100 mL IVPB (has no administration in time range)  vancomycin (VANCOREADY) IVPB 1500 mg/300 mL (has no administration in time range)  propofol  (DIPRIVAN ) 1000 MG/100ML infusion (5 mcg/kg/min  72.6 kg Intravenous New Bag/Given 07/27/23 1214)  lactated ringers bolus 1,000 mL (has no administration in time range)  sodium chloride  0.9 % bolus 1,000 mL (0 mLs Intravenous Stopped 07/27/23 1213)  ipratropium-albuterol (DUONEB) 0.5-2.5 (3) MG/3ML nebulizer solution 9 mL (9 mLs Nebulization Given 07/27/23 1042)  dextrose  50 % solution 50 mL (50 mLs Intravenous Given 07/27/23 1030)  cefTRIAXone (ROCEPHIN) 2 g in sodium chloride  0.9 % 100 mL IVPB (0 g Intravenous Stopped 07/27/23 1052)  azithromycin (ZITHROMAX) 500 mg in sodium chloride  0.9 % 250 mL IVPB (0 mg Intravenous Stopped 07/27/23 1215)  haloperidol lactate (HALDOL) injection 2 mg (2 mg Intravenous Given 07/27/23 1100)  fentaNYL  (SUBLIMAZE )  injection 50 mcg (50 mcg Intravenous Given 07/27/23 1140)  midazolam  (VERSED ) injection 2 mg (2 mg Intravenous Given 07/27/23 1140)  sodium chloride  0.9 % bolus 1,000 mL (1,000 mLs Intravenous New Bag/Given 07/27/23 1219)  iohexol  (OMNIPAQUE ) 350 MG/ML injection 80 mL (75 mLs Intravenous Contrast Given 07/27/23 1136)    FINAL CLINICAL IMPRESSION(S) / ED DIAGNOSES   Final diagnoses:  Acute respiratory failure with hypoxia (HCC)  Pneumonia of right lower lobe due to infectious organism     Rx / DC Orders   ED Discharge Orders     None        Note:  This document was prepared using Dragon voice recognition software and may include unintentional dictation errors.   Suzanne Kirsch, MD 07/27/23 [id number]

## 2023-07-27 NOTE — Code Documentation (Signed)
ETT 25 at the lip

## 2023-07-28 DIAGNOSIS — G928 Other toxic encephalopathy: Secondary | ICD-10-CM | POA: Diagnosis not present

## 2023-07-28 LAB — GLUCOSE, CAPILLARY: Glucose-Capillary: 148 mg/dL — ABNORMAL HIGH (ref 70–99)

## 2023-07-28 LAB — BLOOD GAS, ARTERIAL
Acid-base deficit: 9.5 mmol/L — ABNORMAL HIGH (ref 0.0–2.0)
Bicarbonate: 19.3 mmol/L — ABNORMAL LOW (ref 20.0–28.0)
FIO2: 70 %
MECHVT: 500 mL
Mechanical Rate: 30
O2 Saturation: 88.2 %
PEEP: 10 cmH2O
Patient temperature: 37
pCO2 arterial: 53 mmHg — ABNORMAL HIGH (ref 32–48)
pH, Arterial: 7.17 — CL (ref 7.35–7.45)
pO2, Arterial: 58 mmHg — ABNORMAL LOW (ref 83–108)

## 2023-07-28 LAB — TROPONIN I (HIGH SENSITIVITY): Troponin I (High Sensitivity): 97 ng/L — ABNORMAL HIGH (ref <18)

## 2023-07-28 LAB — LACTIC ACID, PLASMA: Lactic Acid, Venous: 4.3 mmol/L (ref 0.5–1.9)

## 2023-07-28 LAB — STREP PNEUMONIAE URINARY ANTIGEN: Strep Pneumo Urinary Antigen: NEGATIVE

## 2023-07-28 MED ORDER — GLYCOPYRROLATE 1 MG PO TABS
1.0000 mg | ORAL_TABLET | ORAL | Status: DC | PRN
Start: 2023-07-28 — End: 2023-07-28

## 2023-07-28 MED ORDER — POLYVINYL ALCOHOL 1.4 % OP SOLN: 1.0000 [drp] | Freq: Four times a day (QID) | OPHTHALMIC | Status: DC | PRN

## 2023-07-28 MED ORDER — MORPHINE SULFATE (PF) 2 MG/ML IV SOLN
2.0000 mg | INTRAVENOUS | Status: DC | PRN
  Filled 2023-07-28: qty 2

## 2023-07-28 MED ORDER — GLYCOPYRROLATE 0.2 MG/ML IJ SOLN
0.2000 mg | INTRAMUSCULAR | Status: DC | PRN
Start: 2023-07-28 — End: 2023-07-28

## 2023-07-28 MED ORDER — LORAZEPAM 2 MG/ML IJ SOLN: 2.0000 mg | INTRAMUSCULAR | Status: DC | PRN

## 2023-07-28 MED ORDER — ACETAMINOPHEN 325 MG PO TABS: 650.0000 mg | ORAL_TABLET | Freq: Four times a day (QID) | ORAL | Status: DC | PRN

## 2023-07-28 MED ORDER — MIDAZOLAM HCL 2 MG/2ML IJ SOLN
2.0000 mg | Freq: Once | INTRAMUSCULAR | Status: AC
  Administered 2023-07-28: 2 mg via INTRAVENOUS
  Filled 2023-07-28: qty 2

## 2023-07-28 MED ORDER — SODIUM CHLORIDE 0.9 % IV SOLN: INTRAVENOUS | Status: DC

## 2023-07-28 MED ORDER — ACETAMINOPHEN 650 MG RE SUPP: 650.0000 mg | Freq: Four times a day (QID) | RECTAL | Status: DC | PRN

## 2023-07-29 LAB — URINE CULTURE: Culture: NO GROWTH

## 2023-07-30 LAB — LEGIONELLA PNEUMOPHILA SEROGP 1 UR AG: L. pneumophila Serogp 1 Ur Ag: NEGATIVE

## 2023-07-31 LAB — CULTURE, BLOOD (ROUTINE X 2)

## 2023-07-31 NOTE — Procedures (Signed)
 ARTERIAL CATHETER INSERTION PROCEDURE NOTE  Matthew Luna  [id number]  [birth year]  Date:08-23-2023  Time:2:00 AM   Provider Performing: Almarie [name]   Procedure: Insertion of Arterial Line (63379) with US  guidance (23062)   Indication(s) Blood pressure monitoring and/or need for frequent ABGs  Consent Unable to obtain consent due to emergent nature of procedure.  Anesthesia None  Time Out Verified patient identification, verified procedure, site/side was marked, verified correct patient position, special equipment/implants available, medications/allergies/relevant history reviewed, required imaging and test results available.  Sterile Technique Maximal sterile technique including full sterile barrier drape, hand hygiene, sterile gown, sterile gloves, mask, hair covering, sterile ultrasound probe cover (if used).  Procedure Description Area of catheter insertion was cleaned with chlorhexidine  and draped in sterile fashion. With real-time ultrasound guidance an arterial catheter was placed into the left femoral artery.  Appropriate arterial tracings confirmed on monitor.    Complications/Tolerance None; patient tolerated the procedure well.  EBL Minimal  Specimen(s) None   Almarie Nose, DNP, CCRN, FNP-C, AGACNP-BC Acute Care & Family Nurse Practitioner  [facility]  See Amion for personal pager PCCM on call pager [phone] until 7 am

## 2023-07-31 NOTE — Death Summary Note (Signed)
 DEATH SUMMARY   Patient Details  Name: Matthew Luna MRN: [id number] DOB: [birth year]  Admission/Discharge Information   Admit Date:  2023-08-25  Date of Death: Date of Death: 2023-08-26  Time of Death: Time of Death: 0203  Length of Stay: 1  Referring Physician: Duanne Butler DASEN, MD   Reason(s) for Hospitalization  Acute respiratory distress and altered mental status  Diagnoses  Preliminary cause of death: Severe Sepsis with shock and multiorgan Failure secondary to Multifocal pneumonia  Secondary Diagnoses (including complications and co-morbidities): Acute hypoxic hypercapnic respiratory failure secondary severe Multifocal Pneumonia  Principal Problem:   Septic shock [facility])   Brief Hospital Course (including significant findings, care, treatment, and services provided and events leading to death)  Matthew Luna is a 83 y.o. year old male who presented to hospital brought in by EMS  on 08/25/2023 with respiratory distress and altered mental status.   Patient was significantly hypoxic on arrival, with saturation of 55%, prompting initiation of nonrebreather by EMS and transferred to the ED.  On arrival to the ED, patient was intubated given respiratory distress.  Blood work showed significant neutropenia.  He was hypotensive postintubation prompting the initiation of norepinephrine.  Broad-spectrum antibiotics were initiated given concern for sepsis and neutropenia. Initial blood gas showed profound acidosis. He was subsequnetly admitted to the ICU with severe sepsis with septic shock secondary to presumed severe community acquired pneumonia with significant neutropenia. Initiated on vasopressors for BP support, Acute Hypoxic Respiratory Failure secondary to Multi-focal Pneumonia CAP  requiring intubation, with CT showing multifocal infiltrates and RLL consolidation. He was also with significant AKI on CKD (baseline 1.7 to 2) with associated acidosis. Given this, he was initiated on sodium  bicarbonate infusion per [facility]-ICU, and plan to consider initiation of CRRT if no improvement. On 08-25-2023, patient pressure requirement increased with worsening acidosis, HR and poor neurological exam on no sedation. Goals of care was initated with family given signifcant decline in patient's clinical status and imminent death despite aggressive measures. Patient's family decided to transition him to comfort care including terminal extubation (SEE IPAL note). Patient was extubated on 2023-08-26 @ 1:45 per family wishes and he passed away shortly at 02:03 am with family at bedside.  Pertinent Labs and Studies  Significant Diagnostic Studies DG Chest 1 View Result Date: Aug 26, 2023 CLINICAL DATA:  Central line placement verification EXAM: CHEST  1 VIEW COMPARISON:  Chest radiograph Aug 25, 2023 FINDINGS: Endotracheal tube tip in the intrathoracic trachea 4.7 cm from the carina. Right IJ CVC tip in the mid SVC. No pneumothorax. Similar patchy right greater than left airspace opacities. No pleural effusion. No displaced rib fractures. Stable cardiomediastinal silhouette. IMPRESSION: 1. Right IJ CVC tip in the mid SVC. No pneumothorax. 2. Similar patchy right greater than left airspace opacities. Electronically Signed   By: Norman Gatlin M.D.   On: Aug 26, 2023 01:05   DG Chest Port 1 View Result Date: August 25, 2023 CLINICAL DATA:  242050 Oxygen desaturation 242050. Respiratory distress EXAM: PORTABLE CHEST 1 VIEW COMPARISON:  Chest x-ray 08-25-2023 1:27 p.m. FINDINGS: Endotracheal tube 1 cm above the carina. The heart and mediastinal contours are unchanged. Interval worsening of patchy airspace opacities most prominent along the right upper lobe. No pulmonary edema. No pleural effusion. No pneumothorax. No acute osseous abnormality. IMPRESSION: 1. Endotracheal tube 1 cm above the carina. Recommend retraction by 1 cm. 2. Interval worsening of patchy airspace opacities most prominent along the right upper lobe. These  results will be called to the ordering clinician  or representative by the Radiologist Assistant, and communication documented in the PACS or [employer]. Electronically Signed   By: Morgane  Naveau M.D.   On: 07/27/2023 20:33   DG Chest Port 1 View Result Date: 07/27/2023 CLINICAL DATA:  Intubation. EXAM: PORTABLE CHEST 1 VIEW COMPARISON:  Same day. FINDINGS: Endotracheal tube is in grossly good position. Increased right upper lobe opacity is noted suggesting worsening pneumonia. IMPRESSION: Endotracheal tube in grossly good position. Electronically Signed   By: Lynwood Landy Raddle M.D.   On: 07/27/2023 13:59   CT Angio Chest/Abd/Pel for Dissection W and/or Wo Contrast Addendum Date: 07/27/2023 ADDENDUM REPORT: 07/27/2023 12:41 ADDENDUM: Please note, there are acute fractures of right transverse process of L5 vertebrae as well as displaced fractures of the left superior and inferior pubic rami. There also new acute undisplaced fractures of the bilateral sacral ala extending up to S1 neural foramina. There is impacted and angulated fracture of the upper portion of S2 segment as well. Rest of the report remains unchanged. Electronically Signed   By: Ree Molt M.D.   On: 07/27/2023 12:41   Result Date: 07/27/2023 CLINICAL DATA:  Acute aortic syndrome (AAS) suspected. Shortness of breath. EXAM: CT ANGIOGRAPHY CHEST, ABDOMEN AND PELVIS TECHNIQUE: Non-contrast CT of the chest was initially obtained. Multidetector CT imaging through the chest, abdomen and pelvis was performed using the standard protocol during bolus administration of intravenous contrast. Multiplanar reconstructed images and MIPs were obtained and reviewed to evaluate the vascular anatomy. RADIATION DOSE REDUCTION: This exam was performed according to the departmental dose-optimization program which includes automated exposure control, adjustment of the mA and/or kV according to patient size and/or use of iterative reconstruction technique.  CONTRAST:  75mL OMNIPAQUE  IOHEXOL  350 MG/ML SOLN COMPARISON:  CT scan pelvis from 07/08/2023 and CT angiography abdomen and pelvis from 03/16/2016. FINDINGS: CTA CHEST FINDINGS Cardiovascular: No intramural hematoma noted in the thoracic aorta on the unenhanced images. The thoracic aorta is tortuous and exhibits mild-to-moderate intraluminal/intramural thrombus/hematoma as well as atherosclerotic vascular calcifications. The aortic root is within normal limits. The ascending aorta at the level of main pulmonary trunk measures up to 4.0 cm. There are multiple shallow penetrating atherosclerotic ulcers in the distal aortic arch. The proximal descending thoracic aorta measures up to 5.0 cm in diameter. The aorta at the level of diaphragmatic hiatus measures up to 3.4 cm in diameter. Normal cardiac size. No pericardial effusion. There are coronary artery calcifications, in keeping with coronary artery disease. Mediastinum/Nodes: Visualized thyroid  gland appears grossly unremarkable. No solid / cystic mediastinal masses. The esophagus is nondistended precluding optimal assessment. There are multiple enlarged mediastinal and right hilar lymph nodes with largest in the precarinal region measuring up to 1.7 x 3.0 cm. No left hilar or bilateral axillary lymphadenopathy by size criteria. Lungs/Pleura: The central tracheo-bronchial tree is patent. Endotracheal tube is seen with its tip approximately 2.4 cm above the carina. There is multi segmental consolidation in the right upper lobe and right lower lobe with air bronchogram, compatible with multilobar pneumonia. There are compressive atelectatic changes in the left lung. No pleural effusion or pneumothorax on either side. There is a 6 x 10 mm solid noncalcified nodule in the middle lobe (series 8, image 54). Please see follow-up recommendations below. Musculoskeletal: The visualized soft tissues of the chest wall are grossly unremarkable. No suspicious osseous lesions.  There are mild to moderate multilevel degenerative changes in the visualized spine. There is an age indeterminate mild superior endplate compression deformity of T5  vertebral body. No significant perivertebral soft tissue stranding, favoring subacute/chronic etiology. No significant retropulsion or spinal canal compromise at this level. Review of the MIP images confirms the above findings. CTA ABDOMEN AND PELVIS FINDINGS VASCULAR Aorta: Tortuous and dilated abdominal aorta noted. There is small-to-moderate intraluminal thrombus/intramural hematoma. Infrarenal aorto bi-iliac stent noted. The native aneurysmal sac at the level of upper aspect of the aorto bi-iliac stent measures up to 3.8 x 4.6 cm, increased in size since the prior study. There is mild tenting of the aortic wall at approximately 10 o'clock position (series 6, image 173), which was present on the prior exam as well and corresponds to occluded origin of the right renal artery. Celiac: There is mild to moderate circumferential wall thickening of the celiac trunk as well as proximal portion of its main branches. There is subtle fat stranding as well. Findings favor sequela of vasculitis. There is no evidence of aneurysm, dissection or significant stenosis. SMA: There is mild-to-moderate circumferential wall thickening of the proximal/mid part of superior mesenteric artery as well. There is subtle fat stranding. Findings favor sequela of vasculitis. There is no evidence of aneurysm, dissection or significant stenosis Renals: There is mild-to-moderate circumferential wall thickening in the proximal left renal artery. There is mild-to-moderate narrowing at the origin as well. 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 IMA: There is complete occlusion of the inferior mesenteric artery. There is faint distal reconstitution. Inflow: There is saccular aneurysmal dilation of proximal portion of right internal iliac artery measuring up to 1.9 cm in  diameter. There is moderate intraluminal thrombus at this level. There is also saccular aneurysmal dilation of proximal portion of the left internal iliac artery measuring up to 1.7 cm in diameter. Otherwise patent without evidence of aneurysm, dissection, vasculitis or significant stenosis. Veins: No obvious venous abnormality within the limitations of this arterial phase study. Review of the MIP images confirms the above findings. NON-VASCULAR Hepatobiliary: The liver is normal in size. Non-cirrhotic configuration. No suspicious mass. No intrahepatic or extrahepatic bile duct dilation. Gallbladder is surgically absent. Pancreas: Unremarkable. No pancreatic ductal dilatation or surrounding inflammatory changes. Spleen: Within normal limits. No focal lesion. Adrenals/Urinary Tract: Adrenal glands are unremarkable. Asymmetrically small/at least moderately atrophic right kidney containing multiple (more than 7), calculi measuring up to 8 1 cm. There are at least 3 nonobstructive calculi in the left kidney lower pole with largest measuring up to 8 mm. There also multiple sinus cysts in the left kidney. No hydroureteronephrosis or ureterolithiasis on either side. Urinary bladder is under distended, precluding optimal assessment. However, no large mass or stones identified. No perivesical fat stranding. Stomach/Bowel: No disproportionate dilation of the small or large bowel loops. No evidence of abnormal bowel wall thickening or inflammatory changes. The appendix is unremarkable. There are multiple diverticula mainly in the sigmoid colon, without imaging signs of diverticulitis. There is moderate stool burden. Vascular/Lymphatic: No ascites or pneumoperitoneum. No abdominal or pelvic lymphadenopathy, by size criteria. Reproductive: Diminutive prostate.  Symmetric seminal vesicles. Other: There is a small fat containing left inguinal hernia. The soft tissues and abdominal wall are otherwise unremarkable. Musculoskeletal:  No suspicious osseous lesions. There are moderate multilevel degenerative changes in the visualized spine. Review of the MIP images confirms the above findings. IMPRESSION: 1. There is aneurysmal dilation of the thoracoabdominal aorta with measurements as described in detail above. There is also aneurysmal dilation of bilateral internal iliac arteries. There is no evidence of dissection or impending rupture. 2. There is multi  segmental consolidation in the right upper lobe and right lower lobe with air bronchogram, compatible with multilobar pneumonia. Follow-up to clearing is recommended. 3. There is a 6 x 10 mm solid noncalcified nodule in the middle lobe. Please see follow-up recommendations below. 4. There is circumferential wall thickening of the celiac trunk, superior mesenteric artery and left renal artery with subtle fat stranding. Findings favor sequela of vasculitis. 5. 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 6. Multiple other nonacute observations, as described above. Aortic Atherosclerosis (ICD10-I70.0). Aortic aneurysm NOS (ICD10-I71.9). Pulmonary nodule follow-up recommendation: Solid lung nodule measuring 6-8 mm: - LOW RISK:CT at 6-12 months, then consider CT at 18-24 months. - HIGHER RISK: CT at 6-12 months, then CT at 18-24 months. Recommendations for evaluation of incidental nodules derived from guidelines developed by the Fleischner Society ( Radiology 2017; [phone]). These guidelines apply to incidental nodules, which can be managed according to the specific recommendations. These guidelines do not apply to patient's younger than 35 years, immunocompromised patients, or patients with cancer. For lung cancer screening, adherence to the existing [employer] of radiology lung CT Screening Reporting and Data System (lung-RADS) guidelines is recommended. High risk factors include older age, heavy smoking, larger nodule size, irregular or  spiculated margins and upper lobe location. Clinical risk factors include smoking, exposure to other carcinogens, emphysema, fibrosis, upper lobe location, family history of lung cancer, age and sex. Electronically Signed: By: Ree Molt M.D. On: 07/27/2023 12:23   CT Head Wo Contrast Result Date: 07/27/2023 CLINICAL DATA:  Mental status change, acute worsening of shortness of breath. EXAM: CT HEAD WITHOUT CONTRAST TECHNIQUE: Contiguous axial images were obtained from the base of the skull through the vertex without intravenous contrast. RADIATION DOSE REDUCTION: This exam was performed according to the departmental dose-optimization program which includes automated exposure control, adjustment of the mA and/or kV according to patient size and/or use of iterative reconstruction technique. COMPARISON:  CT head 07/08/2023. FINDINGS: Brain: No acute intracranial hemorrhage. No CT evidence of acute infarct. Remote lacunar infarct in the left basal ganglia. Nonspecific hypoattenuation in the periventricular and subcortical white matter favored to reflect chronic microvascular ischemic changes. Mild generalized parenchymal volume loss. No edema, mass effect, or midline shift. The basilar cisterns are patent. Ventricles: The ventricles are normal. Vascular: Atherosclerotic calcifications of the carotid siphons and intracranial vertebral arteries. No hyperdense vessel. Skull: No acute or aggressive finding. Orbits: Orbits are symmetric. Sinuses: Chronic appearing deformity of the anterior wall of the right frontal sinus. Paranasal sinuses are clear. Other: Trace fluid in the left mastoid air cells. Partially visualized endotracheal tube. Multiple small calculi in the right parotid gland again noted. IMPRESSION: No CT evidence of acute intracranial abnormality. Chronic microvascular ischemic changes and mild parenchymal volume loss. Remote infarct in the left basal ganglia. Electronically Signed   By: Donnice Mania  M.D.   On: 07/27/2023 12:03   DG Chest Portable 1 View Result Date: 07/27/2023 CLINICAL DATA:  Shortness of breath EXAM: PORTABLE CHEST 1 VIEW COMPARISON:  12/06/2012. FINDINGS: The heart size appears normal. There is widening of the mediastinum which measures 4.6 cm. Increased soft tissue density along the right paratracheal stripe and increased density within the AP window region noted. Patchy opacities are identified within the right upper lobe and there appears to be consolidative change with air bronchograms within the right base. The visualized osseous structures appear unremarkable. IMPRESSION: 1. Widening of the mediastinum with increased soft tissue density along  the right paratracheal stripe and AP window region. Etiology is indeterminate. Differential considerations include adenopathy, mediastinal mass or aneurysm. Recommend further evaluation with contrast enhanced CT of the chest. 2. Patchy opacities within the right upper lobe and consolidative change with air bronchograms within the right base. These results were called by telephone at the time of interpretation on 07/27/2023 at 11:17 am to provider [facility] , who verbally acknowledged these results. Electronically Signed   By: Waddell Calk M.D.   On: 07/27/2023 11:17   CT PELVIS WO CONTRAST Result Date: 07/08/2023 CLINICAL DATA:  fall, posterior pelvis pain.  Trauma EXAM: CT PELVIS WITHOUT CONTRAST TECHNIQUE: Multidetector CT imaging of the pelvis was performed following the standard protocol without intravenous contrast. RADIATION DOSE REDUCTION: This exam was performed according to the departmental dose-optimization program which includes automated exposure control, adjustment of the mA and/or kV according to patient size and/or use of iterative reconstruction technique. COMPARISON:  CT abdomen pelvis 03/16/2016 FINDINGS: Urinary Tract:  No abnormality visualized. Bowel: Colonic diverticulosis. Otherwise unremarkable visualized pelvic  bowel loops. Vascular/Lymphatic: Partially visualized aorto bi-iliac stent graft repair. No enlarged lymph nodes. No significant vascular abnormality seen. Reproductive:  Unremarkable prostate. Other: No intraperitoneal free fluid. No intraperitoneal free gas. No organized fluid collection. Musculoskeletal: No acute displaced fracture or dislocation of either hips. No acute displaced fracture or diastasis of the bones of the pelvis. Multilevel degenerative changes of visualized lower lumbar spine. Acute nondisplaced fracture of the S2 and S3 level extending to the left S2 sacral foramina. IMPRESSION: 1. Acute nondisplaced fracture of the S2 and S3 level extending to the left S2 sacral foramina. 2. Partially visualized aorto bi-iliac stent graft repair. 3. Colonic diverticulosis. Electronically Signed   By: Morgane  Naveau M.D.   On: 07/08/2023 12:25   CT Lumbar Spine Wo Contrast Result Date: 07/08/2023 CLINICAL DATA:  83 year old male with multiple recent falls. Persistent tailbone pain. EXAM: CT LUMBAR SPINE WITHOUT CONTRAST TECHNIQUE: Multidetector CT imaging of the lumbar spine was performed without intravenous contrast administration. Multiplanar CT image reconstructions were also generated. RADIATION DOSE REDUCTION: This exam was performed according to the departmental dose-optimization program which includes automated exposure control, adjustment of the mA and/or kV according to patient size and/or use of iterative reconstruction technique. COMPARISON:  CT Abdomen and Pelvis 03/16/2016. FINDINGS: Segmentation: Assuming normal lumbar segmentation results in hypoplastic or absent ribs at T12. Correlation with radiographs is recommended prior to any operative intervention. Alignment: Maintained lumbar lordosis. No significant lumbar scoliosis or spondylolisthesis. Vertebrae: Visible lower thoracic levels appear intact. No lumbar vertebral fracture identified. Maintained lumbar vertebral height. Mildly displaced  S2 sacral fracture best seen on series 5, image 37. Associated fracture continuation through the left ala between the left S1 and S2 neural foramina visible on series 3, image 118, series 6, image 77. And fracture through the bilateral posterior elements seen also on sagittal images 32 and 39. Mild displacement at the central sacrum as per the sagittal image. Visible SI joints remain intact. Lower sacral segments and coccyx not included. Paraspinal and other soft tissues: Chronic bifurcated endograft repair of abdominal aortic aneurysm, noncontrast appearance satisfactory. Chronic staghorn right renal calculi and right renal atrophy. Less pronounced left nephrolithiasis, left renal probable parapelvic cysts appear stable. Large bowel diverticulosis. Cholecystectomy. Disc levels: Advanced lumbar spine disc and endplate degeneration L2-L3 through L4-L5 with vacuum disc. Mild to moderate L2-L3, moderate to severe L3-L4 (series 4, image 77), L4-L5 (series 4, image 91) multifactorial degenerative spinal stenosis. IMPRESSION: 1.  Positive for a mildly displaced S2 Sacral Fracture. Left greater than right sacral ala involvement. 2. No lumbar vertebral fracture identified. Lumbar spine degeneration with multifactorial multilevel lower lumbar spinal stenosis. 3. Previous bifurcated endograft repair of abdominal aortic aneurysm. Chronic right renal staghorn calculi and renal atrophy. Aortic Atherosclerosis (ICD10-I70.0). Electronically Signed   By: VEAR Hurst M.D.   On: 07/08/2023 12:17   CT Head Wo Contrast Result Date: 07/08/2023 CLINICAL DATA:  Head trauma, minor (Age >= 65y) EXAM: CT HEAD WITHOUT CONTRAST TECHNIQUE: Contiguous axial images were obtained from the base of the skull through the vertex without intravenous contrast. RADIATION DOSE REDUCTION: This exam was performed according to the departmental dose-optimization program which includes automated exposure control, adjustment of the mA and/or kV according to  patient size and/or use of iterative reconstruction technique. COMPARISON:  11/15/2020. FINDINGS: Brain: There is periventricular white matter decreased attenuation consistent with small vessel ischemic changes. Ventricles, sulci and cisterns are prominent consistent with age related involutional changes. No acute intracranial hemorrhage, mass effect or shift. No hydrocephalus. Foci of encephalomalacia consistent with chronic basal ganglia, periventricular white matter and cerebellar lacunar CVAs. Vascular: No hyperdense vessel or unexpected calcification. Skull: Normal. Negative for fracture or focal lesion. Sinuses/Orbits: No acute finding. IMPRESSION: Atrophy and chronic small vessel ischemic changes. No acute intracranial process identified. Electronically Signed   By: Fonda Field M.D.   On: 07/08/2023 12:14    Microbiology Recent Results (from the past 240 hours)  Blood Culture (routine x 2)     Status: Abnormal (Preliminary result)   Collection Time: 07/27/23 10:24 AM   Specimen: BLOOD  Result Value Ref Range Status   Specimen Description   Final    BLOOD BLOOD RIGHT FOREARM Performed at [facility], [address], [place], [place] [address]    Special Requests   Final    BOTTLES DRAWN AEROBIC ONLY Blood Culture results may not be optimal due to an inadequate volume of blood received in culture bottles Performed at [facility], [address], [place], [place] [address]    Culture  Setup Time   Final    GRAM NEGATIVE RODS AEROBIC BOTTLE ONLY CRITICAL VALUE NOTED.  VALUE IS CONSISTENT WITH PREVIOUSLY REPORTED AND CALLED VALUE. Performed at [facility], [address], [place], [place] [address]    Culture SERRATIA MARCESCENS (A)  Final   Report Status PENDING  Incomplete  Blood Culture (routine x 2)     Status: None (Preliminary result)   Collection Time: 07/27/23 10:27 AM   Specimen: BLOOD  Result Value Ref Range Status   Specimen Description    Final    BLOOD BLOOD RIGHT WRIST Performed at [facility], [address] [place], [place] [address]    Special Requests   Final    BOTTLES DRAWN AEROBIC ONLY Blood Culture results may not be optimal due to an inadequate volume of blood received in culture bottles Performed at [facility], [address] [place], [place] [address]    Culture  Setup Time   Final    GRAM NEGATIVE RODS AEROBIC BOTTLE ONLY Organism ID to follow CRITICAL RESULT CALLED TO, READ BACK BY AND VERIFIED WITH: RANKIN DILLS, PHARMD @[id number]  07/27/2023 COP Performed at [facility], [address] [place], [place] [address]    Culture   Final    VONNE NEGATIVE RODS CULTURE REINCUBATED FOR BETTER GROWTH Performed at [facility], [address] [place], [place] [address]  Report Status PENDING  Incomplete  Blood Culture ID Panel (Reflexed)     Status: Abnormal   Collection Time: 07/27/23 10:27 AM  Result Value Ref Range Status   Enterococcus faecalis NOT DETECTED NOT DETECTED Final   Enterococcus Faecium NOT DETECTED NOT DETECTED Final   Listeria monocytogenes NOT DETECTED NOT DETECTED Final   Staphylococcus species NOT DETECTED NOT DETECTED Final   Staphylococcus aureus (BCID) NOT DETECTED NOT DETECTED Final   Staphylococcus epidermidis NOT DETECTED NOT DETECTED Final   Staphylococcus lugdunensis NOT DETECTED NOT DETECTED Final   Streptococcus species NOT DETECTED NOT DETECTED Final   Streptococcus agalactiae NOT DETECTED NOT DETECTED Final   Streptococcus pneumoniae NOT DETECTED NOT DETECTED Final   Streptococcus pyogenes NOT DETECTED NOT DETECTED Final   A.calcoaceticus-baumannii NOT DETECTED NOT DETECTED Final   Bacteroides fragilis NOT DETECTED NOT DETECTED Final   Enterobacterales DETECTED (A) NOT DETECTED Final    Comment: CRITICAL RESULT CALLED TO, READ BACK BY AND VERIFIED WITH: NATHAN BELUE, PHARMD @2341  07/27/2023 COP    Enterobacter cloacae complex NOT  DETECTED NOT DETECTED Final   Escherichia coli NOT DETECTED NOT DETECTED Final   Klebsiella aerogenes NOT DETECTED NOT DETECTED Final   Klebsiella oxytoca NOT DETECTED NOT DETECTED Final   Klebsiella pneumoniae DETECTED (A) NOT DETECTED Final    Comment: CRITICAL RESULT CALLED TO, READ BACK BY AND VERIFIED WITH: NATHAN BELUE, PHARMD @2341  07/27/2023 COP    Proteus species NOT DETECTED NOT DETECTED Final   Salmonella species NOT DETECTED NOT DETECTED Final   Serratia marcescens DETECTED (A) NOT DETECTED Final    Comment: CRITICAL RESULT CALLED TO, READ BACK BY AND VERIFIED WITH: NATHAN BELUE, PHARMD @2341  07/27/2023 COP    Haemophilus influenzae NOT DETECTED NOT DETECTED Final   Neisseria meningitidis NOT DETECTED NOT DETECTED Final   Pseudomonas aeruginosa NOT DETECTED NOT DETECTED Final   Stenotrophomonas maltophilia NOT DETECTED NOT DETECTED Final   Candida albicans NOT DETECTED NOT DETECTED Final   Candida auris NOT DETECTED NOT DETECTED Final   Candida glabrata NOT DETECTED NOT DETECTED Final   Candida krusei NOT DETECTED NOT DETECTED Final   Candida parapsilosis NOT DETECTED NOT DETECTED Final   Candida tropicalis NOT DETECTED NOT DETECTED Final   Cryptococcus neoformans/gattii NOT DETECTED NOT DETECTED Final   CTX-M ESBL NOT DETECTED NOT DETECTED Final   Carbapenem resistance IMP NOT DETECTED NOT DETECTED Final   Carbapenem resistance KPC NOT DETECTED NOT DETECTED Final   Carbapenem resistance NDM NOT DETECTED NOT DETECTED Final   Carbapenem resist OXA 48 LIKE NOT DETECTED NOT DETECTED Final   Carbapenem resistance VIM NOT DETECTED NOT DETECTED Final    Comment: Performed at [facility], [address] [place], [place] [address]  Resp panel by RT-PCR (RSV, Flu A&B, Covid) Anterior Nasal Swab     Status: None   Collection Time: 07/27/23 10:45 AM   Specimen: Anterior Nasal Swab  Result Value Ref Range Status   SARS Coronavirus 2 by RT PCR NEGATIVE NEGATIVE Final     Comment: (NOTE) SARS-CoV-2 target nucleic acids are NOT DETECTED.  The SARS-CoV-2 RNA is generally detectable in upper respiratory specimens during the acute phase of infection. The lowest concentration of SARS-CoV-2 viral copies this assay can detect is 138 copies/mL. A negative result does not preclude SARS-Cov-2 infection and should not be used as the sole basis for treatment or other patient management decisions. A negative result may occur with  improper specimen collection/handling, submission of specimen other than nasopharyngeal  swab, presence of viral mutation(s) within the areas targeted by this assay, and inadequate number of viral copies(<138 copies/mL). A negative result must be combined with clinical observations, patient history, and epidemiological information. The expected result is Negative.  Fact Sheet for Patients:  BloggerCourse.com  Fact Sheet for Healthcare Providers:  SeriousBroker.it  This test is no t yet approved or cleared by the [place]  FDA and  has been authorized for detection and/or diagnosis of SARS-CoV-2 by FDA under an Emergency Use Authorization (EUA). This EUA will remain  in effect (meaning this test can be used) for the duration of the COVID-19 declaration under Section 564(b)(1) of the Act, 21 U.S.C.section 360bbb-3(b)(1), unless the authorization is terminated  or revoked sooner.       Influenza A by PCR NEGATIVE NEGATIVE Final   Influenza B by PCR NEGATIVE NEGATIVE Final    Comment: (NOTE) The Xpert Xpress SARS-CoV-2/FLU/RSV plus assay is intended as an aid in the diagnosis of influenza from Nasopharyngeal swab specimens and should not be used as a sole basis for treatment. Nasal washings and aspirates are unacceptable for Xpert Xpress SARS-CoV-2/FLU/RSV testing.  Fact Sheet for Patients: BloggerCourse.com  Fact Sheet for Healthcare  Providers: SeriousBroker.it  This test is not yet approved or cleared by the [place]  FDA and has been authorized for detection and/or diagnosis of SARS-CoV-2 by FDA under an Emergency Use Authorization (EUA). This EUA will remain in effect (meaning this test can be used) for the duration of the COVID-19 declaration under Section 564(b)(1) of the Act, 21 U.S.C. section 360bbb-3(b)(1), unless the authorization is terminated or revoked.     Resp Syncytial Virus by PCR NEGATIVE NEGATIVE Final    Comment: (NOTE) Fact Sheet for Patients: BloggerCourse.com  Fact Sheet for Healthcare Providers: SeriousBroker.it  This test is not yet approved or cleared by the [place]  FDA and has been authorized for detection and/or diagnosis of SARS-CoV-2 by FDA under an Emergency Use Authorization (EUA). This EUA will remain in effect (meaning this test can be used) for the duration of the COVID-19 declaration under Section 564(b)(1) of the Act, 21 U.S.C. section 360bbb-3(b)(1), unless the authorization is terminated or revoked.  Performed at [facility], [address], [place], [place] [address]   Urine Culture     Status: None   Collection Time: 07/27/23 12:45 PM   Specimen: Urine, Random  Result Value Ref Range Status   Specimen Description   Final    URINE, RANDOM Performed at [facility], [address] [place], [place] [address]    Special Requests   Final    NONE Reflexed from [id number] Performed at [facility], [address], [place], [place] [address]    Culture   Final    NO GROWTH Performed at [facility], [address], [place], [place] [address]    Report Status 07/29/2023 FINAL  Final  MRSA Next Gen by PCR, Nasal     Status: None   Collection Time: 07/27/23  2:56 PM   Specimen: Nasal Mucosa; Nasal Swab  Result Value Ref Range Status   MRSA by PCR  Next Gen NOT DETECTED NOT DETECTED Final    Comment: (NOTE) The GeneXpert MRSA Assay (FDA approved for NASAL specimens only), is one component of a comprehensive MRSA colonization surveillance program. It is not intended to diagnose MRSA infection nor to guide or monitor treatment for MRSA infections. Test performance is not FDA approved in patients less than 2 years  old. Performed at [facility], [address] [place], [place] [address]     Lab Basic Metabolic Panel: Recent Labs  Lab 07/27/23 1020 07/27/23 1650  NA 140 140  K 4.8 4.0  CL 101 103  CO2 20* 16*  GLUCOSE 60* 102*  BUN 129* 117*  CREATININE 5.50* 5.27*  CALCIUM  8.2* 7.6*   Liver Function Tests: Recent Labs  Lab 07/27/23 1020 07/27/23 1650  AST 45* 45*  ALT 17 15  ALKPHOS 152* 126  BILITOT 1.3* 0.9  PROT 6.6 6.1*  ALBUMIN 2.8* 2.4*   No results for input(s): LIPASE, AMYLASE in the last 168 hours. No results for input(s): AMMONIA in the last 168 hours. CBC: Recent Labs  Lab 07/27/23 1020 07/27/23 1650  WBC 0.8* 0.7*  NEUTROABS 0.6* 0.5*  HGB 14.2 12.9*  HCT 42.9 40.1  MCV 96.0 101.5*  PLT 153 106*   Cardiac Enzymes: Recent Labs  Lab 07/27/23 1020  CKTOTAL 129   Sepsis Labs: Recent Labs  Lab 07/27/23 1020 07/27/23 1253 07/27/23 1650 07/27/23 1951 07/27/23 2345  WBC 0.8*  --  0.7*  --   --   LATICACIDVEN 5.2* 6.2* 6.9* 7.1* 4.3*    Procedures/Operations  See procedure notes   Almarie [name] NP 07/29/2023, 10:16 PM

## 2023-07-31 NOTE — Progress Notes (Signed)
 Patient has expired with family at bedside. Patient has no palpable pulses and is without respirations. Pronounced by this RN and 2nd RN to verify, Artemus Bihari, RN; Almarie Nose, NP notified.

## 2023-07-31 NOTE — Procedures (Signed)
 CENTRAL VENOUS CATHETER INSERTION PROCEDURE NOTE  IRMA DELANCEY  [id number]  [birth year]  Date:13-Aug-2023  Time:2:04 AM   Provider Performing:Addylin Manke A Milen Lengacher   Procedure: Insertion of Non-tunneled Central Venous Catheter(36556) with US  guidance (23062)   Indication(s) Medication administration and Difficult access  Consent Unable to obtain consent due to emergent nature of procedure.  Anesthesia Topical only with 1% lidocaine    Timeout Verified patient identification, verified procedure, site/side was marked, verified correct patient position, special equipment/implants available, medications/allergies/relevant history reviewed, required imaging and test results available.  Sterile Technique Maximal sterile technique including full sterile barrier drape, hand hygiene, sterile gown, sterile gloves, mask, hair covering, sterile ultrasound probe cover (if used).  Procedure Description Area of catheter insertion was cleaned with chlorhexidine  and draped in sterile fashion.  With real-time ultrasound guidance a central venous catheter was placed into the right internal jugular vein. Nonpulsatile blood flow and easy flushing noted in all ports.  The catheter was sutured in place and sterile dressing applied.  Complications/Tolerance None; patient tolerated the procedure well. Chest X-ray is ordered to verify placement for internal jugular or subclavian cannulation.   Chest x-ray is not ordered for femoral cannulation.  EBL Minimal  Specimen(s) None   Almarie Nose, DNP, CCRN, FNP-C, AGACNP-BC Acute Care & Family Nurse Practitioner  [facility]  See Amion for personal pager PCCM on call pager [phone] until 7 am

## 2023-07-31 NOTE — Progress Notes (Signed)
 Pt extubated for comfort care to RA per MD with no noted issues to report. RT available for assistance as needed.

## 2023-07-31 NOTE — IPAL (Addendum)
 Interdisciplinary Goals of Care Family Meeting     Date carried out: August 22, 2023  Location of the meeting: Bedside   Member's involved: NP and Family Member or next of kin   Durable Power of Attorney or acting medical decision maker: Son Alika Saladin ll   Discussion:  Advance Care Planning/Goals of Care discussion was performed during the course of treatment to decide on type of care right for this patient following admission to the ICU.   I spoke with patient's son and daughter in law over the phone and advised them that the patient status had changed with imminent decline and death. Patient's family came to the bedside and we  discussed goals of care in details following  change in patient's current status. Reviewed patient's worsening lab, ABGs, vital signs including unstable HR and blood pressure requiring multiple pressors  and overall poor prognosis with the family at the bedside and answered all their question.   Discussed prognosis, expected outcome with or without ongoing aggressive treatments and the options for de-escalation of care.   Diagnosis(es): Acute hypoxic hypercapnic respiratory failure secondary severe Multifocal Pneumonia, Severe Sepsis with shock and multiorgan Failure secondary to Multifocal pneumonia Prognosis: Poor Code Status: DNR Disposition: ICU Next Steps:  Family understands the situation. They have consented and agreed to DNR/DNI and would not wish to pursue any aggressive treatment.  Patient's family would like to proceed with full comfort care including terminal extubation.    Family are satisfied with Plan of action and management. All questions answered     Total Time Spent Face to Face addressing advance care planning in the presence of the Patient: 35 minutes       Almarie Nose, DNP, FNP-C, AGACNP-BC Acute Care Nurse Practitioner [facility] Pager: [phone] [place] [facility]

## 2023-07-31 DEATH — deceased
